# Patient Record
Sex: Female | Born: 1955 | Race: Black or African American | Hispanic: No | Marital: Married | Smoking: Never smoker
Health system: Southern US, Community
[De-identification: ages and names within clinical notes are randomized; demographics above are authoritative.]

## PROBLEM LIST (undated history)

## (undated) DIAGNOSIS — G4730 Sleep apnea, unspecified: Secondary | ICD-10-CM

## (undated) DIAGNOSIS — M1990 Unspecified osteoarthritis, unspecified site: Secondary | ICD-10-CM

## (undated) DIAGNOSIS — Z923 Personal history of irradiation: Secondary | ICD-10-CM

## (undated) DIAGNOSIS — C50919 Malignant neoplasm of unspecified site of unspecified female breast: Secondary | ICD-10-CM

## (undated) DIAGNOSIS — C801 Malignant (primary) neoplasm, unspecified: Secondary | ICD-10-CM

## (undated) DIAGNOSIS — R519 Headache, unspecified: Secondary | ICD-10-CM

## (undated) DIAGNOSIS — Z9221 Personal history of antineoplastic chemotherapy: Secondary | ICD-10-CM

## (undated) DIAGNOSIS — R51 Headache: Secondary | ICD-10-CM

## (undated) DIAGNOSIS — I10 Essential (primary) hypertension: Secondary | ICD-10-CM

## (undated) DIAGNOSIS — E119 Type 2 diabetes mellitus without complications: Secondary | ICD-10-CM

## (undated) DIAGNOSIS — G5601 Carpal tunnel syndrome, right upper limb: Secondary | ICD-10-CM

## (undated) DIAGNOSIS — D649 Anemia, unspecified: Secondary | ICD-10-CM

## (undated) HISTORY — DX: Type 2 diabetes mellitus without complications: E11.9

## (undated) HISTORY — PX: BREAST LUMPECTOMY: SHX2

## (undated) HISTORY — PX: BREAST BIOPSY: SHX20

## (undated) HISTORY — DX: Essential (primary) hypertension: I10

## (undated) HISTORY — DX: Malignant neoplasm of unspecified site of unspecified female breast: C50.919

## (undated) HISTORY — PX: DILATION AND CURETTAGE OF UTERUS: SHX78

---

## 1999-06-20 ENCOUNTER — Other Ambulatory Visit: Admission: RE | Admit: 1999-06-20 | Discharge: 1999-06-20 | Payer: Self-pay | Admitting: Internal Medicine

## 1999-08-10 ENCOUNTER — Encounter: Payer: Self-pay | Admitting: Internal Medicine

## 1999-08-10 ENCOUNTER — Encounter: Admission: RE | Admit: 1999-08-10 | Discharge: 1999-08-10 | Payer: Self-pay | Admitting: Internal Medicine

## 1999-08-24 ENCOUNTER — Encounter: Admission: RE | Admit: 1999-08-24 | Discharge: 1999-08-24 | Payer: Self-pay | Admitting: Internal Medicine

## 1999-08-24 ENCOUNTER — Encounter: Payer: Self-pay | Admitting: Internal Medicine

## 2000-03-04 ENCOUNTER — Encounter: Payer: Self-pay | Admitting: Internal Medicine

## 2000-03-04 ENCOUNTER — Encounter: Admission: RE | Admit: 2000-03-04 | Discharge: 2000-03-04 | Payer: Self-pay | Admitting: Internal Medicine

## 2000-09-04 ENCOUNTER — Encounter: Admission: RE | Admit: 2000-09-04 | Discharge: 2000-09-04 | Payer: Self-pay | Admitting: Internal Medicine

## 2000-09-04 ENCOUNTER — Encounter: Payer: Self-pay | Admitting: Internal Medicine

## 2001-04-07 ENCOUNTER — Ambulatory Visit (HOSPITAL_COMMUNITY): Admission: RE | Admit: 2001-04-07 | Discharge: 2001-04-07 | Payer: Self-pay | Admitting: Gastroenterology

## 2001-09-05 ENCOUNTER — Encounter: Admission: RE | Admit: 2001-09-05 | Discharge: 2001-09-05 | Payer: Self-pay | Admitting: Internal Medicine

## 2001-09-05 ENCOUNTER — Encounter: Payer: Self-pay | Admitting: Internal Medicine

## 2001-10-23 ENCOUNTER — Encounter: Admission: RE | Admit: 2001-10-23 | Discharge: 2001-10-23 | Payer: Self-pay | Admitting: Obstetrics and Gynecology

## 2001-10-23 ENCOUNTER — Encounter: Payer: Self-pay | Admitting: Obstetrics and Gynecology

## 2002-02-18 ENCOUNTER — Encounter: Admission: RE | Admit: 2002-02-18 | Discharge: 2002-02-18 | Payer: Self-pay | Admitting: Obstetrics and Gynecology

## 2002-02-18 ENCOUNTER — Encounter: Payer: Self-pay | Admitting: Obstetrics and Gynecology

## 2002-03-11 ENCOUNTER — Ambulatory Visit (HOSPITAL_COMMUNITY): Admission: RE | Admit: 2002-03-11 | Discharge: 2002-03-11 | Payer: Self-pay | Admitting: Obstetrics and Gynecology

## 2002-03-11 ENCOUNTER — Encounter (INDEPENDENT_AMBULATORY_CARE_PROVIDER_SITE_OTHER): Payer: Self-pay

## 2005-04-05 ENCOUNTER — Encounter: Admission: RE | Admit: 2005-04-05 | Discharge: 2005-04-05 | Payer: Self-pay | Admitting: Internal Medicine

## 2005-05-08 ENCOUNTER — Encounter: Admission: RE | Admit: 2005-05-08 | Discharge: 2005-05-08 | Payer: Self-pay | Admitting: Internal Medicine

## 2005-08-02 ENCOUNTER — Encounter: Admission: RE | Admit: 2005-08-02 | Discharge: 2005-08-02 | Payer: Self-pay | Admitting: Internal Medicine

## 2005-09-14 ENCOUNTER — Encounter: Admission: RE | Admit: 2005-09-14 | Discharge: 2005-09-14 | Payer: Self-pay | Admitting: Internal Medicine

## 2005-10-15 ENCOUNTER — Other Ambulatory Visit: Admission: RE | Admit: 2005-10-15 | Discharge: 2005-10-15 | Payer: Self-pay | Admitting: Obstetrics and Gynecology

## 2006-01-29 ENCOUNTER — Ambulatory Visit (HOSPITAL_COMMUNITY): Admission: RE | Admit: 2006-01-29 | Discharge: 2006-01-29 | Payer: Self-pay | Admitting: Obstetrics and Gynecology

## 2006-05-06 ENCOUNTER — Encounter: Admission: RE | Admit: 2006-05-06 | Discharge: 2006-05-06 | Payer: Self-pay | Admitting: Internal Medicine

## 2007-01-13 ENCOUNTER — Encounter: Admission: RE | Admit: 2007-01-13 | Discharge: 2007-01-13 | Payer: Self-pay | Admitting: Internal Medicine

## 2007-03-04 ENCOUNTER — Encounter: Admission: RE | Admit: 2007-03-04 | Discharge: 2007-03-04 | Payer: Self-pay | Admitting: Internal Medicine

## 2007-04-21 ENCOUNTER — Other Ambulatory Visit: Admission: RE | Admit: 2007-04-21 | Discharge: 2007-04-21 | Payer: Self-pay | Admitting: Internal Medicine

## 2008-04-22 ENCOUNTER — Other Ambulatory Visit: Admission: RE | Admit: 2008-04-22 | Discharge: 2008-04-22 | Payer: Self-pay | Admitting: Internal Medicine

## 2008-04-28 ENCOUNTER — Encounter: Admission: RE | Admit: 2008-04-28 | Discharge: 2008-04-28 | Payer: Self-pay | Admitting: Internal Medicine

## 2008-04-29 ENCOUNTER — Encounter: Admission: RE | Admit: 2008-04-29 | Discharge: 2008-04-29 | Payer: Self-pay | Admitting: Internal Medicine

## 2008-11-19 ENCOUNTER — Encounter: Admission: RE | Admit: 2008-11-19 | Discharge: 2008-11-19 | Payer: Self-pay | Admitting: Internal Medicine

## 2009-06-03 ENCOUNTER — Other Ambulatory Visit: Admission: RE | Admit: 2009-06-03 | Discharge: 2009-06-03 | Payer: Self-pay | Admitting: Internal Medicine

## 2009-06-13 ENCOUNTER — Encounter: Admission: RE | Admit: 2009-06-13 | Discharge: 2009-06-13 | Payer: Self-pay | Admitting: Internal Medicine

## 2010-09-03 ENCOUNTER — Encounter: Payer: Self-pay | Admitting: Obstetrics and Gynecology

## 2010-12-29 NOTE — H&P (Signed)
NAME:  Audrey Peters, Audrey Peters                          ACCOUNT NO.:  [id number]   MEDICAL RECORD NO.:  [id number]                   PATIENT TYPE:  AMB   LOCATION:  SDC                                  FACILITY:  [facility]   PHYSICIAN:  Diana B. Thomasena Edis, M.D.              DATE OF BIRTH:  Jan 01, 1956   DATE OF ADMISSION:  03/11/2002  DATE OF DISCHARGE:                                HISTORY & PHYSICAL   HISTORY OF PRESENT ILLNESS:  The patient is a 55 year old African-American  female referred for heavy bleeding. The patient had been on Triphasil for  years but continues to bleed every two to three weeks. She had a negative  pregnancy test, hemoglobin, however was noted to be normal at 13.1. She  subsequently underwent a pelvic ultrasound due to the menorrhagia with clots  and was found to have mildly prominent endometrium. She subsequently was  changed to Ovcon 35 but continued to bleed 5-6 days and continued to have  increasing dysmenorrhea. In addition, she did have a TSH checked at Dr.  Mayra Neer' office which was found to be negative. Subsequently, due to  her dysmenorrhea, the patient was scheduled for a sonohysterogram where she  was found to have an endometrial polyp measuring 11 mm x 7 mm x 9 mm.  Adjacent to this was a 5 x 2 mm polyp. The patient was advised to undergo  D&C hysteroscopy. The risks of surgery including anesthetic complications,  hemorrhage, infection, damage to adjacent structures including bladder,  bowel, blood vessels, and ureters were discussed with the patient. She was  made aware of the risk of uterine perforation which could result in  overwhelming life threatening hemorrhage requiring emergent hysterectomy or  uterine perforation which could result in bowel damage requiring emergent  colostomy or which could result in overwhelming life threatening  peritonitis. She expressed an understanding of and acceptance of these risks  and desires to proceed with the  procedure.   OB/GYN HISTORY:  Menarche age 32. Contraception Ovcon 35. No history of  sexually transmitted diseases. The patient's last Pap smear performed by Dr.  Lovell Sheehan was reportedly normal.   PAST MEDICAL HISTORY:  Hypertension.   ALLERGIES:  No known drug allergies.   CURRENT MEDICATIONS:  1. Ovcon 35  2. Zyrtec D  3. Triamterene 37.5 mg   PAST SURGICAL HISTORY:  None.   INJURIES:  None.   FAMILY HISTORY:  There is no family history of colon, breast, ovarian, or  prostate cancer. The patient's father died at 43 of irregular heart beat for  which he had a pacemaker. Mother is 37 with hypertension and diabetes. She  has one son age 17 alive and well.   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 Tobacco none. Alcohol none.   REVIEW OF SYSTEMS:  Noncontributory except as noted above. Denies headache,  visual changes,  chest pain, shortness of breath, abdominal pain, change in  bowel habits, unintentional weight loss, dysuria, urgency, frequency,  vaginal pruritus or discharge, pain or bleeding with intercourse.   PHYSICAL EXAMINATION:  GENERAL:  Well developed, pleasant female.  VITAL SIGNS:  Blood pressure 136/82, heart rate 84, weight 242.  HEENT:  Normal.  NECK:  Supple without thyromegaly, adenopathy or nodules.  LUNGS:  Clear to auscultation.  BREASTS:  Symmetrical without masses, nodes, no nipple retraction or nipple  discharge.  CARDIAC:  Regular rate and rhythm without extra sounds or murmurs.  ABDOMEN:  Soft, nontender, no hepatosplenomegaly or masses.  EXTREMITIES:  No cyanosis, clubbing or edema.  NEUROLOGIC:  Oriented x3, grossly normal.  PELVIC:  Normal external female genitalia. No vulva or vaginal regions. The  patient was found to have a cervical nabothian cyst which was biopsy  confirmed. Bimanual examination reveals the uterus to be upper limit of  normal size, mobile and nontender without any adnexal mass  palpated.  RECTAL:  Excellent sphincter tone. Confirms pelvic and no masses palpated.   LABORATORY DATA:  Ultrasound shows a mildly prominent uterus especially in  the fundal region where there was found to be a 1.8 cm palpable fibroid. The  ovaries were noted to be normal. The right ovary showed a collapsed cyst. As  mentioned above, the patient's TSH was also normal.   ASSESSMENT AND PLAN:  The patient is a 55 year old African-American female  for D&C hysteroscopy due to continued dysmenorrhea and menorrhagia and found  to have endometrial polyps on sonohysterogram. The risks have been explained  to the patient. She expresses understanding of and acceptance of these risks  and desires to proceed with D&C hysteroscopy.                                                Beather Arbour Thomasena Edis, M.D.    DBC/MEDQ  D:  03/10/2002  T:  03/10/2002  Job:  [id number]   cc:   Day Surgery

## 2010-12-29 NOTE — Op Note (Signed)
NAME:  Audrey Peters, Audrey Peters                          ACCOUNT NO.:  [id number]   MEDICAL RECORD NO.:  [id number]                   PATIENT TYPE:  AMB   LOCATION:  SDC                                  FACILITY:  [facility]   PHYSICIAN:  Diana B. Thomasena Edis, M.D.              DATE OF BIRTH:  Dec 26, 1955   DATE OF PROCEDURE:  03/11/2002  DATE OF DISCHARGE:                                 OPERATIVE REPORT   PREOPERATIVE DIAGNOSES:  1. Menorrhagia.  2. Polyps on sonohysterogram.  3. Continued menorrhagia on oral contraceptives.   POSTOPERATIVE DIAGNOSES:  1. Menorrhagia.  2. Polyps on sonohysterogram.  3. Continue menorrhagia on oral contraceptives.   PROCEDURES:  1. Dilatation and curettage.  2. Polypectomy.   Unable to do hysteroscopy.   ANESTHESIA:  Monitored anesthesia care plus 10 cc of 1% lidocaine  paracervical block.   ESTIMATED BLOOD LOSS:  Minimal.   FLUIDS:  1350 cc of crystalloid.   COMPLICATIONS:  None.   DRAINS:  None.   DESCRIPTION OF PROCEDURE:  The patient was brought to the operating room and  identified on the operating room table.  After the patient was adequately  sedated using monitored anesthesia care, she was placed in the  dorsolithotomy position and prepped and draped in the usual sterile fashion.  The bladder was straight catheterized for approximately 50 cc of clear  yellow urine.  A bimanual examination revealed the uterus to be in mid  position and level without any adnexal mass palpated.  The anterior lip of  the cervix was infiltrated with 1 cc of 1% lidocaine and grasped with a  single-tooth tenaculum.  The remaining 9 cc of 1% lidocaine were infused for  paracervical block.  The uterus sounded to approximately 8 cm.  Using [facility]  dilators, the cervix was very carefully and gently dilated.  However, it  should be noted that the cervical os, although parous, was noted to be very  eccentric with the opening to the left.  I was able to easily sound the  uterus and was able to easily gently dilate her up to a #19 Pratt dilator.  However, after the #21 Pratt dilator, I was unable to dilate up past that  point.  This was due to the eccentric nature of the cervix and the fact that  approximately 1.5-2 cm into the endocervical canal there was a ridge of  tissue that emanated into the endocervical canal, making it extremely  difficult to dilate further.  It was the surgeon's opinion not to proceed  any further because to force this could result in uterine perforation and  subsequent complications due to perforation.  Thus, as I was able to place  the small serrated curet to remove the polyp, I elected to do this instead.  The serrated curet was then placed and the uterus was systemically curetted  in a systematic clockwise fashion with tissue obtained.  The Randall stone  forceps were placed and additional tissue was obtained.  I did curet the  uterus such that a good cry was heard all around.  At that point, the  procedure was terminated.  The tenaculum was removed.  There was noted to be  some bleeding at the tenaculum site.  Hemostasis was achieved using silver  nitrate sticks.  At that point, the procedure was then terminated.  The  patient tolerated the procedure well without apparent complications.  She  was transferred to the recovery room in stable condition after all sponge,  needle, and instrument counts were correct.  The patient received the  postoperative D&C instruction sheet.  She is to take 400-600 mg of ibuprofen  every six hours as needed for pain.  She is to call for any problems and to  return to the office in two to three weeks for a postoperative exam.                                               Lafonda Mosses B. Thomasena Edis, M.D.    DBC/MEDQ  D:  03/11/2002  T:  03/16/2002  Job:  [id number]   cc:   Lilla Shook, M.D.

## 2010-12-29 NOTE — Procedures (Signed)
[facility]  Patient:    Audrey Peters, Audrey Peters Visit Number: [id number] MRN: [id number]          Service Type: Attending:  Verlin Grills, M.D. Proc. Date: 04/07/01   CC:         Modesta Messing, M.D., [facility]   Procedure Report  PROCEDURE:  Colonoscopy.  PROCEDURE INDICATION:  Audrey Peters (date of birth 2056/08/07) is a 55 year old female with Hemoccult positive stool and iron deficiency anemia.  ENDOSCOPIST:  Verlin Grills, M.D.  PREMEDICATION:  Versed 7.5 mg, fentanyl 50 mcg.  ENDOSCOPE:  Olympus pediatric colonoscope.  DESCRIPTION OF PROCEDURE:  After obtaining informed consent, Audrey Peters was placed in the left lateral decubitus position.  I administered intravenous fentanyl and intravenous Versed to achieve conscious sedation for the procedure.  The patients blood pressure, oxygen saturations, and cardiac rhythm were monitored throughout the procedure and documented in the medical record.  Anal inspection was normal.  Digital rectal exam was normal.  The Olympus pediatric video colonoscope was introduced into the rectum and easily advanced to the cecum.  Colonic preparation for the exam today was excellent.  RECTUM:  Normal.  SIGMOID COLON AND DESCENDING COLON:  Normal.  SPLENIC FLEXURE:  Normal.  TRANSVERSE COLON:  Normal.  HEPATIC FLEXURE:  Normal.  ASCENDING COLON:  Normal.  CECUM AND ILEOCECAL VALVE:  Normal.  ASSESSMENT:  Normal proctocolonoscopy to the cecum.  No endoscopic evidence for the presence of colorectal neoplasia or inflammatory bowel disease. Attending:  Verlin Grills, M.D. DD:  04/07/01 TD:  04/07/01 Job: [id number]

## 2013-06-09 ENCOUNTER — Other Ambulatory Visit: Payer: Self-pay | Admitting: Internal Medicine

## 2013-06-09 DIAGNOSIS — Z1231 Encounter for screening mammogram for malignant neoplasm of breast: Secondary | ICD-10-CM

## 2013-07-17 ENCOUNTER — Ambulatory Visit
Admission: RE | Admit: 2013-07-17 | Discharge: 2013-07-17 | Disposition: A | Discharge status: Home / Self Care | Payer: BC Managed Care – PPO | Source: Ambulatory Visit | Attending: Internal Medicine | Admitting: Internal Medicine

## 2013-07-17 DIAGNOSIS — Z1231 Encounter for screening mammogram for malignant neoplasm of breast: Secondary | ICD-10-CM

## 2014-02-22 ENCOUNTER — Other Ambulatory Visit: Payer: Self-pay | Admitting: Internal Medicine

## 2014-02-22 ENCOUNTER — Other Ambulatory Visit (HOSPITAL_COMMUNITY)
Admission: RE | Admit: 2014-02-22 | Discharge: 2014-02-22 | Disposition: A | Discharge status: Home / Self Care | Payer: BC Managed Care – PPO | Source: Ambulatory Visit | Attending: Internal Medicine | Admitting: Internal Medicine

## 2014-02-22 DIAGNOSIS — Z1151 Encounter for screening for human papillomavirus (HPV): Secondary | ICD-10-CM | POA: Insufficient documentation

## 2014-02-22 DIAGNOSIS — Z01419 Encounter for gynecological examination (general) (routine) without abnormal findings: Secondary | ICD-10-CM | POA: Insufficient documentation

## 2014-02-23 LAB — CYTOLOGY - PAP

## 2014-05-11 ENCOUNTER — Ambulatory Visit
Admission: RE | Admit: 2014-05-11 | Discharge: 2014-05-11 | Disposition: A | Discharge status: Home / Self Care | Payer: BC Managed Care – PPO | Source: Ambulatory Visit | Attending: Internal Medicine | Admitting: Internal Medicine

## 2014-05-11 ENCOUNTER — Other Ambulatory Visit: Payer: Self-pay | Admitting: Internal Medicine

## 2014-05-11 DIAGNOSIS — M25561 Pain in right knee: Secondary | ICD-10-CM

## 2014-05-13 ENCOUNTER — Encounter: Payer: Self-pay | Admitting: Cardiology

## 2014-09-10 ENCOUNTER — Other Ambulatory Visit: Payer: Self-pay

## 2014-09-10 DIAGNOSIS — Z1231 Encounter for screening mammogram for malignant neoplasm of breast: Secondary | ICD-10-CM

## 2014-09-17 ENCOUNTER — Ambulatory Visit
Admission: RE | Admit: 2014-09-17 | Discharge: 2014-09-17 | Disposition: A | Discharge status: Home / Self Care | Payer: BC Managed Care – PPO | Source: Ambulatory Visit

## 2014-09-17 DIAGNOSIS — Z1231 Encounter for screening mammogram for malignant neoplasm of breast: Secondary | ICD-10-CM

## 2015-01-30 ENCOUNTER — Ambulatory Visit (INDEPENDENT_AMBULATORY_CARE_PROVIDER_SITE_OTHER): Payer: BC Managed Care – PPO | Admitting: Physician Assistant

## 2015-01-30 VITALS — BP 144/86 | HR 77 | Temp 98.5°F | Resp 18 | Ht 64.0 in | Wt 221.8 lb

## 2015-01-30 DIAGNOSIS — I10 Essential (primary) hypertension: Secondary | ICD-10-CM

## 2015-01-30 DIAGNOSIS — E119 Type 2 diabetes mellitus without complications: Secondary | ICD-10-CM

## 2015-01-30 DIAGNOSIS — K05219 Aggressive periodontitis, localized, unspecified severity: Secondary | ICD-10-CM

## 2015-01-30 DIAGNOSIS — K0521 Aggressive periodontitis, localized: Secondary | ICD-10-CM | POA: Diagnosis not present

## 2015-01-30 MED ORDER — CLINDAMYCIN HCL 300 MG PO CAPS: 300.0000 mg | ORAL_CAPSULE | Freq: Three times a day (TID) | ORAL | Status: DC

## 2015-01-30 NOTE — Patient Instructions (Signed)
We did an incision and drainage of the abscess in your mouth. Please take the clindamycin three times daily for 10 days.  Be sure to take a probiotic during the next couple days.  Please be sure to see a dentist this week to look into your tooth infection.

## 2015-01-30 NOTE — Progress Notes (Signed)
  Medical screening examination/treatment/procedure(s) were performed by non-physician practitioner and as supervising physician I was immediately available for consultation/collaboration.     

## 2015-01-30 NOTE — Progress Notes (Signed)
   Subjective:    Patient ID: Audrey Peters, female    DOB: Nov 27, 1955, 59 y.o.   MRN: [id number]  Chief Complaint  Patient presents with  . Abscess    Location: Right inside of mouth   Patient Active Problem List   Diagnosis Date Noted  . Essential hypertension 01/30/2015  . Diabetes mellitus 01/30/2015   Prior to Admission medications   Medication Sig Start Date End Date Taking? Authorizing Provider  insulin aspart protamine- aspart (NOVOLOG MIX 70/30) (70-30) 100 UNIT/ML injection Inject into the skin.   Yes Historical Provider, MD  losartan (COZAAR) 100 MG tablet Take 100 mg by mouth daily.   Yes Historical Provider, MD  metFORMIN (GLUCOPHAGE) 500 MG tablet Take by mouth 2 (two) times daily with a meal.   Yes Historical Provider, MD  triamterene-hydrochlorothiazide (MAXZIDE-25) 37.5-25 MG per tablet  01/07/15  Yes Historical Provider, MD  clindamycin (CLEOCIN) 300 MG capsule Take 1 capsule (300 mg total) by mouth 3 (three) times daily. 01/30/15   Araceli Bouche, PA  NOVOLOG MIX 70/30 FLEXPEN (70-30) 100 UNIT/ML FlexPen  01/26/15   Historical Provider, MD  ONE TOUCH ULTRA TEST test strip  01/26/15   Historical Provider, MD   Medications, allergies, past medical history, surgical history, family history, social history and problem list reviewed and updated.  HPI  59 yof presents with tooth abscess.  Pain started Friday. Feels there is an abscess upper right mouth. Has not been dentist in several yrs. Denies fevers. Has been taking ibuprofen for pain.   Review of Systems No chills.     Objective:   Physical Exam  Constitutional: She is oriented to person, place, and time. She appears well-developed and well-nourished.  Non-toxic appearance. She does not have a sickly appearance. She does not appear ill. No distress.  BP 144/86 mmHg  Pulse 77  Temp(Src) 98.5 F (36.9 C) (Oral)  Resp 18  Ht 5\' 4"  (1.626 m)  Wt 221 lb 12.8 oz (100.608 kg)  BMI 38.05 kg/m2  SpO2 97%   HENT:    Mouth/Throat:    Abscess in area of missing first molar right upper mouth. Right sided facial swelling.   Neurological: She is alert and oriented to person, place, and time.      Assessment & Plan:   59 yof presents with tooth abscess.  Periodontal abscess - Plan: clindamycin (CLEOCIN) 300 MG capsule --I&D as per procedure note, pt tolerated well --clinda tid 10 days --instructed to see dentist within next week as she has infected tooth near site of abscess  Julieta Gutting, PA-C Physician Assistant-Certified [name]  01/30/2015 10:20 AM

## 2015-01-30 NOTE — Progress Notes (Signed)
Verbal Consent Obtained. Infraorbital nerve block anesthesia with 2 cc of 1% lidocaine with epi.  11 blade used to incise the lesion centrally.  Purulence expressed.  Cleansed and dressed.

## 2015-07-06 NOTE — H&P (Signed)
59yo postmenopausal female who presents hysteroscopy, D&C due to recurrent PMB. Work up to date has been negative (EMB and TVUS) and pt was started on vaginal estrogen due to bleeding from suspected atrophy. The patient has been using the cream, but intermittent spotting has continued. The spotting is usually worse around the first of the month and states it feels like it is the "start of her period." Spotting is pink to bright red- denies heavy bleeding. No abdominal or pelvic pain. She does think the estrace has helped with the dryness and it occasionally causes her to "feel warm." Usually last for just a few seconds and then resolves.  Gyn History  Sexual activity currently sexually active.  Periods : postmenopausal.  Denies H/O LMP- see HPI Denies H/O Birth control.  Last pap smear date 02/22/2014 Normal.  Last mammogram date 09/17/2014.  Denies H/O Abnormal pap smear.  GYN procedures 03/03/15 - Endometrial BX.    OB History  Pregnancy # 1 live birth, vaginal delivery, boy.     Current Medications  Taking   Oscal 500/200 D-3(Calcium-Vitamin D) 500-200 MG-UNIT Tablet 1 tablet Once a day   Tylenol Extra Strength(APAP) 500 MG Tablet 1 tablet as needed every 6 hrs   BD U/F Short Pen Needle(Insulin Pen Needle) 31G X 8 MM Miscellaneous USE TWICE DAILY.   Maxzide-25(Triamterene-HCTZ) 37.5-25 MG Tablet TAKE 1 TABLET IN THE MORNING.   Lotrisone(Clotrimazole-Betamethasone) 1-0.05 % Cream APPLY TO AFFECTED AREA TWICE A DAY FOR 7 DAYS.   OneTouch Ultra Test(Blood Glucose Test) . Strip CHECK BLOOD SUGAR TWICE DAILY.   Ciprofloxacin HCl 250 MG Tablet 1 tablet twice a day   Atorvastatin Calcium 40 MG Tablet 1 tablet Once a day at bedtime   Losartan Potassium 100 MG Tablet 1 tablet Once a day   Aspir-81(Aspirin) 81 MG Tablet Delayed Release 1 tablet Once a day   Premarin(Estrogens Conjugated) 0.625 MG/GM Cream 0.5g Twice per week   Metformin HCl 500 MG Tablet 1 tablet with a meal Twice a day    NovoLog Mix 70/30 Flexpen(Insulin Aspart Prot & Aspart) (70-30) 100 UNIT/ML Suspension Pen-injector 110 units at breakfast, 50 units at lunch, 50 units at evening meal 3 times a day   Medication List reviewed and reconciled with the patient    Past Medical History  Type 2 diabetes mellitus  Hypercholesterolemia  Hypertension  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   Social History  General:  Tobacco use  cigarettes: Never smoked Tobacco history last updated 04/08/2015 Additional Findings: Tobacco Non-User Non-smoker for personal reasons no EXPOSURE TO PASSIVE SMOKE.  no Alcohol, no.  no Recreational drug use.  Marital Status: married, Married.  OCCUPATION: Pharmacist, hospital, retired, formerly at [employer].    Allergies  Sulfa drugs (for allergy)  Cipro: palpitation  Invokana: yeast infections: Side Effects   Hospitalization/Major Diagnostic Procedure  childbirth    Review of Systems  CONSTITUTIONAL:  no Chills. no Fever.  SKIN:  no Rash. no Hives.  HEENT:  Blurrred vision no. no Double vision.  CARDIOLOGY:  no Chest pain.  RESPIRATORY:  no Shortness of breath. no Cough.  GASTROENTEROLOGY:  no Abdominal pain. no Appetite change. no Change in bowel movements.  UROLOGY:  no Urinary frequency. no  Urinary incontinence. no Urinary urgency.  FEMALE REPRODUCTIVE:  no Breast lumps or discharge. no Breast pain.  NEUROLOGY:  no Dizziness. no Headache. no Loss of consciousness.  PSYCHOLOGY:  no Depression. no Confusion.  HEMATOLOGY/LYMPH:  no Anemia. no Fatigue. Using Blood Thinners no.     O: Performed in office  Vital Signs  Wt 236, Wt change 1.6 lb, Ht 64.5, BMI 39.88, Pulse sitting 80, BP sitting 116/70.    Examination  General Examination: GENERAL APPEARANCE well developed, well nourished.  SKIN: warm and dry, no rashes.  NECK: supple, normal appearance.  LUNGS: regular breathing rate and effort.  ABDOMEN: soft and not tender.  FEMALE GENITOURINARY: normal external genitalia, labia - unremarkable, vagina - pink moist mucosa with loss of rugae- findings c/w atrophy, no lesions or abnormal discharge, cervix - no discharge or lesions or CMT, adnexa - no masses or tenderness.  MUSCULOSKELETAL no calf tenderness bilaterally.  EXTREMITIES: no edema present.  PSYCH appropriate mood and affect.     Labs: SHG: (03/2015): 8wk sized uterus with 3 small fibroids- largest 1.8cm submucosal fibroid slightly pushing into endometrial cavity. No masses seen within endometrium. Thin endometrium walls- total thickness 3.89mm. Normal ovaries bilaterally.  A/P: 59yo postmenopausal female who presents for hysteroscopy, D&C due to recurrent postmenopausal bleeding -NPO -LR @ 125cc/hr -Antibiotics not indicated -SCDs to OR -Risk,benefit and indications reviewed with patient.  Questions and concerns were addressed.  Janyth Pupa, DO [phone] (pager) [phone] (office)

## 2015-07-13 ENCOUNTER — Other Ambulatory Visit: Payer: Self-pay

## 2015-07-13 ENCOUNTER — Encounter (HOSPITAL_COMMUNITY)
Admission: RE | Admit: 2015-07-13 | Discharge: 2015-07-13 | Disposition: A | Discharge status: Home / Self Care | Payer: BC Managed Care – PPO | Source: Ambulatory Visit | Attending: Obstetrics & Gynecology | Admitting: Obstetrics & Gynecology

## 2015-07-13 ENCOUNTER — Encounter (HOSPITAL_COMMUNITY): Payer: Self-pay

## 2015-07-13 DIAGNOSIS — N950 Postmenopausal bleeding: Secondary | ICD-10-CM | POA: Insufficient documentation

## 2015-07-13 DIAGNOSIS — Z01818 Encounter for other preprocedural examination: Secondary | ICD-10-CM | POA: Diagnosis not present

## 2015-07-13 HISTORY — DX: Sleep apnea, unspecified: G47.30

## 2015-07-13 LAB — CBC
HEMATOCRIT: 39.4 % (ref 36.0–46.0)
Hemoglobin: 12.7 g/dL (ref 12.0–15.0)
MCH: 27.7 pg (ref 26.0–34.0)
MCHC: 32.2 g/dL (ref 30.0–36.0)
MCV: 86 fL (ref 78.0–100.0)
Platelets: 210 10*3/uL (ref 150–400)
RBC: 4.58 MIL/uL (ref 3.87–5.11)
RDW: 13.4 % (ref 11.5–15.5)
WBC: 7.8 10*3/uL (ref 4.0–10.5)

## 2015-07-13 LAB — BASIC METABOLIC PANEL
Anion gap: 9 (ref 5–15)
BUN: 18 mg/dL (ref 6–20)
CO2: 27 mmol/L (ref 22–32)
Calcium: 9.2 mg/dL (ref 8.9–10.3)
Chloride: 104 mmol/L (ref 101–111)
Creatinine, Ser: 0.91 mg/dL (ref 0.44–1.00)
GFR calc Af Amer: >60 mL/min (ref >60)
GLUCOSE: 143 mg/dL — AB (ref 65–99)
POTASSIUM: 3.6 mmol/L (ref 3.5–5.1)
Sodium: 140 mmol/L (ref 135–145)

## 2015-07-13 NOTE — Patient Instructions (Addendum)
Your procedure is scheduled on:07/21/15  Enter through the Main Entrance at :51 am Pick up desk phone and dial [phone] and inform us of your arrival.  Please call [phone] if you have any problems the morning of surgery.  Remember: Do not eat food after midnight:WED Clear liquids are ok until:9am on Thursday   You may brush your teeth the morning of surgery. DO NOT TAKE METFORMIN THE MORNING OF SURGERY Take these meds the morning of surgery with a sip of water:All blood pressure pills  DO NOT wear jewelry, eye make-up, lipstick,body lotion, or dark fingernail polish.  (Polished toes are ok) You may wear deodorant.  If you are to be admitted after surgery, leave suitcase in car until your room has been assigned. Patients discharged on the day of surgery will not be allowed to drive home. Wear loose fitting, comfortable clothes for your ride home.

## 2015-07-15 NOTE — Anesthesia Preprocedure Evaluation (Addendum)
Anesthesia Evaluation  Patient identified by MRN, date of birth, ID band Patient awake    Reviewed: Allergy & Precautions, NPO status , Patient's Chart, lab work & pertinent test results  Airway Mallampati: III  TM Distance: >3 FB Neck ROM: Full    Dental  (+) Teeth Intact, Dental Advisory Given   Pulmonary sleep apnea and Continuous Positive Airway Pressure Ventilation ,    Pulmonary exam normal breath sounds clear to auscultation       Cardiovascular Exercise Tolerance: Good hypertension, Pt. on medications Normal cardiovascular exam Rhythm:Regular Rate:Normal  EKG 06/2015 OK   Neuro/Psych    GI/Hepatic negative GI ROS, Neg liver ROS, neg GERD  ,  Endo/Other  diabetes, Poorly Controlled, Insulin DependentMorbid obesity  Renal/GU negative Renal ROS     Musculoskeletal negative musculoskeletal ROS (+)   Abdominal   Peds  Hematology 13/39   Anesthesia Other Findings   Reproductive/Obstetrics negative OB ROS                          Anesthesia Physical Anesthesia Plan  ASA: III  Anesthesia Plan: General   Post-op Pain Management:    Induction: Intravenous  Airway Management Planned: LMA  Additional Equipment:   Intra-op Plan:   Post-operative Plan: Extubation in OR  Informed Consent: I have reviewed the patients History and Physical, chart, labs and discussed the procedure including the risks, benefits and alternatives for the proposed anesthesia with the patient or authorized representative who has indicated his/her understanding and acceptance.   Dental advisory given  Plan Discussed with: CRNA  Anesthesia Plan Comments: (Risks/benefits of general anesthesia discussed with patient including risk of damage to teeth, lips, gum, and tongue, nausea/vomiting, allergic reactions to medications, and the possibility of heart attack, stroke and death.  All patient questions answered.   Patient wishes to proceed.)       Anesthesia Quick Evaluation

## 2015-07-21 ENCOUNTER — Ambulatory Visit (HOSPITAL_COMMUNITY)
Admission: RE | Admit: 2015-07-21 | Discharge: 2015-07-21 | Disposition: A | Discharge status: Home / Self Care | Payer: BC Managed Care – PPO | Source: Ambulatory Visit | Attending: Obstetrics & Gynecology | Admitting: Obstetrics & Gynecology

## 2015-07-21 ENCOUNTER — Encounter (HOSPITAL_COMMUNITY)
Admission: RE | Disposition: A | Discharge status: Home / Self Care | Payer: Self-pay | Source: Ambulatory Visit | Attending: Obstetrics & Gynecology

## 2015-07-21 ENCOUNTER — Ambulatory Visit (HOSPITAL_COMMUNITY): Payer: BC Managed Care – PPO | Admitting: Anesthesiology

## 2015-07-21 ENCOUNTER — Encounter (HOSPITAL_COMMUNITY): Payer: Self-pay | Admitting: *Deleted

## 2015-07-21 DIAGNOSIS — D250 Submucous leiomyoma of uterus: Secondary | ICD-10-CM | POA: Diagnosis not present

## 2015-07-21 DIAGNOSIS — K219 Gastro-esophageal reflux disease without esophagitis: Secondary | ICD-10-CM | POA: Insufficient documentation

## 2015-07-21 DIAGNOSIS — N840 Polyp of corpus uteri: Secondary | ICD-10-CM | POA: Diagnosis not present

## 2015-07-21 DIAGNOSIS — I10 Essential (primary) hypertension: Secondary | ICD-10-CM | POA: Insufficient documentation

## 2015-07-21 DIAGNOSIS — G4730 Sleep apnea, unspecified: Secondary | ICD-10-CM | POA: Insufficient documentation

## 2015-07-21 DIAGNOSIS — N950 Postmenopausal bleeding: Secondary | ICD-10-CM | POA: Insufficient documentation

## 2015-07-21 DIAGNOSIS — Z6841 Body Mass Index (BMI) 40.0 and over, adult: Secondary | ICD-10-CM | POA: Insufficient documentation

## 2015-07-21 HISTORY — PX: HYSTEROSCOPY W/D&C: SHX1775

## 2015-07-21 LAB — GLUCOSE, CAPILLARY
Glucose-Capillary: 162 mg/dL — ABNORMAL HIGH (ref 65–99)
Glucose-Capillary: 147 mg/dL — ABNORMAL HIGH (ref 65–99)

## 2015-07-21 SURGERY — DILATATION AND CURETTAGE /HYSTEROSCOPY
Anesthesia: General

## 2015-07-21 MED ORDER — FENTANYL CITRATE (PF) 100 MCG/2ML IJ SOLN
25.0000 ug | INTRAMUSCULAR | Status: DC | PRN
  Administered 2015-07-21: 50 ug via INTRAVENOUS

## 2015-07-21 MED ORDER — LACTATED RINGERS IV SOLN
INTRAVENOUS | Status: DC
  Administered 2015-07-21 (×2): via INTRAVENOUS

## 2015-07-21 MED ORDER — FENTANYL CITRATE (PF) 100 MCG/2ML IJ SOLN
INTRAMUSCULAR | Status: DC | PRN
  Administered 2015-07-21 (×4): 50 ug via INTRAVENOUS

## 2015-07-21 MED ORDER — DEXAMETHASONE SODIUM PHOSPHATE 4 MG/ML IJ SOLN
INTRAMUSCULAR | Status: AC
  Filled 2015-07-21: qty 1

## 2015-07-21 MED ORDER — PROMETHAZINE HCL 25 MG/ML IJ SOLN: 6.2500 mg | INTRAMUSCULAR | Status: DC | PRN

## 2015-07-21 MED ORDER — KETOROLAC TROMETHAMINE 30 MG/ML IJ SOLN
INTRAMUSCULAR | Status: AC
  Filled 2015-07-21: qty 1

## 2015-07-21 MED ORDER — SCOPOLAMINE 1 MG/3DAYS TD PT72
MEDICATED_PATCH | TRANSDERMAL | Status: AC
  Administered 2015-07-21: 1.5 mg via TRANSDERMAL
  Filled 2015-07-21: qty 1

## 2015-07-21 MED ORDER — ONDANSETRON HCL 4 MG/2ML IJ SOLN
INTRAMUSCULAR | Status: AC
  Filled 2015-07-21: qty 2

## 2015-07-21 MED ORDER — PHENYLEPHRINE HCL 10 MG/ML IJ SOLN
INTRAMUSCULAR | Status: DC | PRN
  Administered 2015-07-21 (×3): 40 ug via INTRAVENOUS

## 2015-07-21 MED ORDER — FENTANYL CITRATE (PF) 250 MCG/5ML IJ SOLN
INTRAMUSCULAR | Status: AC
  Filled 2015-07-21: qty 5

## 2015-07-21 MED ORDER — KETOROLAC TROMETHAMINE 30 MG/ML IJ SOLN
INTRAMUSCULAR | Status: DC | PRN
  Administered 2015-07-21: 15 mg via INTRAVENOUS

## 2015-07-21 MED ORDER — SCOPOLAMINE 1 MG/3DAYS TD PT72
1.0000 | MEDICATED_PATCH | Freq: Once | TRANSDERMAL | Status: DC
Start: 2015-07-21 — End: 2015-07-21
  Administered 2015-07-21: 1.5 mg via TRANSDERMAL

## 2015-07-21 MED ORDER — FENTANYL CITRATE (PF) 100 MCG/2ML IJ SOLN: 25.0000 ug | INTRAMUSCULAR | Status: DC | PRN

## 2015-07-21 MED ORDER — PROPOFOL 10 MG/ML IV BOLUS
INTRAVENOUS | Status: DC | PRN
  Administered 2015-07-21: 180 mg via INTRAVENOUS

## 2015-07-21 MED ORDER — GLYCOPYRROLATE 0.2 MG/ML IJ SOLN
INTRAMUSCULAR | Status: AC
Start: 2015-07-21 — End: 2015-07-21
  Filled 2015-07-21: qty 1

## 2015-07-21 MED ORDER — PROPOFOL 10 MG/ML IV BOLUS
INTRAVENOUS | Status: AC
  Filled 2015-07-21: qty 20

## 2015-07-21 MED ORDER — LIDOCAINE HCL (CARDIAC) 20 MG/ML IV SOLN
INTRAVENOUS | Status: DC | PRN
  Administered 2015-07-21: 80 mg via INTRAVENOUS

## 2015-07-21 MED ORDER — LIDOCAINE HCL (CARDIAC) 20 MG/ML IV SOLN
INTRAVENOUS | Status: AC
  Filled 2015-07-21: qty 5

## 2015-07-21 MED ORDER — ONDANSETRON HCL 4 MG/2ML IJ SOLN
INTRAMUSCULAR | Status: DC | PRN
  Administered 2015-07-21: 4 mg via INTRAVENOUS

## 2015-07-21 MED ORDER — MIDAZOLAM HCL 2 MG/2ML IJ SOLN
INTRAMUSCULAR | Status: AC
  Filled 2015-07-21: qty 2

## 2015-07-21 MED ORDER — LACTATED RINGERS IV SOLN: INTRAVENOUS | Status: DC

## 2015-07-21 MED ORDER — SILVER NITRATE-POT NITRATE 75-25 % EX MISC
CUTANEOUS | Status: AC
Start: 2015-07-21 — End: 2015-07-21
  Filled 2015-07-21: qty 1

## 2015-07-21 MED ORDER — SILVER NITRATE-POT NITRATE 75-25 % EX MISC
CUTANEOUS | Status: AC
  Filled 2015-07-21: qty 3

## 2015-07-21 MED ORDER — MEPERIDINE HCL 25 MG/ML IJ SOLN: 6.2500 mg | INTRAMUSCULAR | Status: DC | PRN

## 2015-07-21 MED ORDER — FENTANYL CITRATE (PF) 100 MCG/2ML IJ SOLN
INTRAMUSCULAR | Status: AC
  Filled 2015-07-21: qty 2

## 2015-07-21 MED ORDER — MIDAZOLAM HCL 2 MG/2ML IJ SOLN
INTRAMUSCULAR | Status: DC | PRN
  Administered 2015-07-21: 1 mg via INTRAVENOUS

## 2015-07-21 SURGICAL SUPPLY — 18 items
CANISTER SUCT 3000ML (MISCELLANEOUS) ×3 IMPLANT
CATH ROBINSON RED A/P 16FR (CATHETERS) ×3 IMPLANT
CLOTH BEACON ORANGE TIMEOUT ST (SAFETY) ×3 IMPLANT
CONTAINER PREFILL 10% NBF 60ML (FORM) ×6 IMPLANT
DEVICE MYOSURE LITE (MISCELLANEOUS) ×2 IMPLANT
DILATOR CANAL MILEX (MISCELLANEOUS) IMPLANT
GLOVE BIOGEL PI IND STRL 6.5 (GLOVE) ×2 IMPLANT
GLOVE BIOGEL PI IND STRL 7.0 (GLOVE) ×1 IMPLANT
GLOVE BIOGEL PI INDICATOR 6.5 (GLOVE) ×4
GLOVE BIOGEL PI INDICATOR 7.0 (GLOVE) ×2
GLOVE ECLIPSE 6.5 STRL STRAW (GLOVE) ×3 IMPLANT
GOWN STRL REUS W/TWL LRG LVL3 (GOWN DISPOSABLE) ×6 IMPLANT
PACK VAGINAL MINOR WOMEN LF (CUSTOM PROCEDURE TRAY) ×3 IMPLANT
PAD OB MATERNITY 4.3X12.25 (PERSONAL CARE ITEMS) ×3 IMPLANT
TOWEL OR 17X24 6PK STRL BLUE (TOWEL DISPOSABLE) ×6 IMPLANT
TUBING AQUILEX INFLOW (TUBING) ×5 IMPLANT
TUBING AQUILEX OUTFLOW (TUBING) ×3 IMPLANT
WATER STERILE IRR 1000ML POUR (IV SOLUTION) ×3 IMPLANT

## 2015-07-21 NOTE — Anesthesia Postprocedure Evaluation (Signed)
Anesthesia Post Note  Patient: Audrey Peters  Procedure(s) Performed: Procedure(s) (LRB): DILATATION AND CURETTAGE /HYSTEROSCOPY with myosure (N/A)  Patient location during evaluation: PACU Anesthesia Type: General Level of consciousness: awake and alert Pain management: pain level controlled Vital Signs Assessment: post-procedure vital signs reviewed and stable Respiratory status: spontaneous breathing, nonlabored ventilation, respiratory function stable and patient connected to nasal cannula oxygen Cardiovascular status: blood pressure returned to baseline and stable Postop Assessment: no signs of nausea or vomiting Anesthetic complications: no    Last Vitals:  Filed Vitals:   07/21/15 1128 07/21/15 1442  BP: 135/90 138/81  Pulse: 68 82  Temp: 36.6 C 36.6 C  Resp: 18 16    Last Pain:  Filed Vitals:   07/21/15 1459  PainSc: 7                  Zenaida Deed

## 2015-07-21 NOTE — Op Note (Signed)
Operative Report  PreOp: postmenopausal bleeding PostOp: same and uterine polyps Procedure:  Hysteroscopy, Dilation and Curettage, Myosure polypectomy Surgeon: Dr. Janyth Pupa Anesthesia: General Complications:none EBL: 36mL UOP: 50cc IVF:1000cc  Findings:8 cm uterus with thickened endometrium and multiple polyps noted throughout the cavity, both ostia visualized Specimens: 1) ECC 2) EMB with polyp specimen   Procedure: The patient was taken to the operating room where she underwent general anesthesia without difficulty. The patient was placed in a low lithotomy position using Allen stirrups. The patient was examined with the findings as noted above.  She was then prepped and draped in the normal sterile fashion. The bladder was drained using a red rubber urethral catheter. A sterile speculum was inserted into the vagina. A single tooth tenaculum was placed on the anterior lip of the cervix. The uterus was then sounded to 8cm. The endocervical canal was then serially dilated to 14French using Hank dilators.  The diagnostic hysteroscope was then inserted without difficulty and noted to have the findings as listed above. The hysteroscope was removed and replaced with the Myosure device.  Resection of the all uterine polyps was performed.  The device was removed and sharp curettage was performed.  The hysteroscope was reinserted- normal cavity seen with no evidence of uterine perforation.  NS was used as the distending medium.  All instrument were then removed. Hemostasis was observed at the cervical site.  The patient was repositioned to the supine position. The patient tolerated the procedure without any complications and taken to recovery in stable condition.   Janyth Pupa, DO [phone] (pager) [phone] (office)

## 2015-07-21 NOTE — Transfer of Care (Signed)
Immediate Anesthesia Transfer of Care Note  Patient: Audrey Peters  Procedure(s) Performed: Procedure(s): DILATATION AND CURETTAGE /HYSTEROSCOPY with myosure (N/A)  Patient Location: PACU  Anesthesia Type:General  Level of Consciousness: awake, alert  and oriented  Airway & Oxygen Therapy: Patient Spontanous Breathing and Patient connected to nasal cannula oxygen  Post-op Assessment: Report given to RN and Post -op Vital signs reviewed and stable  Post vital signs: Reviewed and stable  Last Vitals:  Filed Vitals:   07/21/15 1128  BP: 135/90  Pulse: 68  Temp: 36.6 C  Resp: 18    Complications: No apparent anesthesia complications

## 2015-07-21 NOTE — Anesthesia Procedure Notes (Signed)
Procedure Name: LMA Insertion Date/Time: 07/21/2015 2:03 PM Performed by: Elenore Paddy Pre-anesthesia Checklist: Patient identified, Emergency Drugs available, Suction available, Patient being monitored and Timeout performed Patient Re-evaluated:Patient Re-evaluated prior to inductionOxygen Delivery Method: Circle system utilized Preoxygenation: Pre-oxygenation with 100% oxygen Intubation Type: IV induction LMA: LMA with gastric port inserted LMA Size: 4.0 Tube type: Oral Number of attempts: 1 Placement Confirmation: positive ETCO2 and breath sounds checked- equal and bilateral Dental Injury: Teeth and Oropharynx as per pre-operative assessment

## 2015-07-21 NOTE — Discharge Instructions (Addendum)
HOME INSTRUCTIONS  Please note any unusual or excessive bleeding, pain, swelling. Mild dizziness or drowsiness are normal for about 24 hours after surgery.   Shower when comfortable  Restrictions: No driving for 24 hours or while taking pain medications.  Activity:  No heavy lifting (> 10 lbs), nothing in vagina (no tampons, douching, or intercourse) x 2 weeks; no tub baths for 2 weeks Vaginal spotting is expected but if your bleeding is heavy, period like,  please call the office   Incision: the bandaids will fall off when they are ready to; you may clean your incision with mild soap and water but do not rub or scrub the incision site.  You may experience slight bloody drainage from your incision periodically.  This is normal.  If you experience a large amount of drainage or the incision opens, please call your physician who will likely direct you to the emergency department.  Diet:  You may return to your regular diet.  Do not eat large meals.  Eat small frequent meals throughout the day.  Continue to drink a good amount of water at least 6-8 glasses of water per day, hydration is very important for the healing process.  Pain Management: Take Motrin and/or Tylenol as needed for pain.  Always take prescription pain medication with food, it may cause constipation, increase fluids and fiber and you may want to take an over-the-counter stool softener like Colace as needed up to 2x a day.    Alcohol -- Avoid for 24 hours and while taking pain medications.  Nausea: Take sips of ginger ale or soda  Fever -- Call physician if temperature over 101 degrees  Follow up:  If you do not already have a follow up appointment scheduled, please call the office at [phone].  If you experience fever (a temperature greater than 100.4), pain unrelieved by pain medication, shortness of breath, swelling of a single leg, or any other symptoms which are concerning to you please the office immediately.

## 2015-07-21 NOTE — Interval H&P Note (Signed)
History and Physical Interval Note:  07/21/2015 1:14 PM  Audrey Peters  has presented today for surgery, with the diagnosis of N95.0 Postmenopausal Bleeding  The various methods of treatment have been discussed with the patient and family. After consideration of risks, benefits and other options for treatment, the patient has consented to  Procedure(s): DILATATION AND CURETTAGE /HYSTEROSCOPY (N/A) as a surgical intervention .  The patient's history has been reviewed, patient examined, no change in status, stable for surgery.  I have reviewed the patient's chart and labs.  Questions were answered to the patient's satisfaction.     Janyth Pupa, M

## 2015-07-22 ENCOUNTER — Encounter (HOSPITAL_COMMUNITY): Payer: Self-pay | Admitting: Obstetrics & Gynecology

## 2016-04-10 ENCOUNTER — Other Ambulatory Visit: Payer: Self-pay | Admitting: Gastroenterology

## 2016-05-23 ENCOUNTER — Encounter (HOSPITAL_COMMUNITY): Payer: Self-pay | Admitting: *Deleted

## 2016-05-27 NOTE — Anesthesia Preprocedure Evaluation (Addendum)
Anesthesia Evaluation  Patient identified by MRN, date of birth, ID band Patient awake    Reviewed: Allergy & Precautions, NPO status , Patient's Chart, lab work & pertinent test results  History of Anesthesia Complications Negative for: history of anesthetic complications  Airway Mallampati: II  TM Distance: >3 FB Neck ROM: Full    Dental  (+) Teeth Intact   Pulmonary sleep apnea ,    breath sounds clear to auscultation       Cardiovascular hypertension,  Rhythm:Regular Rate:Normal     Neuro/Psych  Headaches,    GI/Hepatic negative GI ROS, Neg liver ROS,   Endo/Other  diabetes  Renal/GU negative Renal ROS     Musculoskeletal negative musculoskeletal ROS (+)   Abdominal (+) + obese,   Peds  Hematology  (+) anemia ,   Anesthesia Other Findings   Reproductive/Obstetrics                            Anesthesia Physical Anesthesia Plan  ASA: III  Anesthesia Plan: MAC   Post-op Pain Management:    Induction: Intravenous  Airway Management Planned: Natural Airway and Simple Face Mask  Additional Equipment:   Intra-op Plan:   Post-operative Plan:   Informed Consent:   Plan Discussed with: CRNA  Anesthesia Plan Comments:         Anesthesia Quick Evaluation

## 2016-05-28 ENCOUNTER — Ambulatory Visit (HOSPITAL_COMMUNITY): Payer: BC Managed Care – PPO | Admitting: Certified Registered Nurse Anesthetist

## 2016-05-28 ENCOUNTER — Encounter (HOSPITAL_COMMUNITY)
Admission: RE | Disposition: A | Discharge status: Home / Self Care | Payer: Self-pay | Source: Ambulatory Visit | Attending: Gastroenterology

## 2016-05-28 ENCOUNTER — Ambulatory Visit (HOSPITAL_COMMUNITY)
Admission: RE | Admit: 2016-05-28 | Discharge: 2016-05-28 | Disposition: A | Discharge status: Home / Self Care | Payer: BC Managed Care – PPO | Source: Ambulatory Visit | Attending: Gastroenterology | Admitting: Gastroenterology

## 2016-05-28 DIAGNOSIS — E669 Obesity, unspecified: Secondary | ICD-10-CM | POA: Diagnosis not present

## 2016-05-28 DIAGNOSIS — Z79899 Other long term (current) drug therapy: Secondary | ICD-10-CM | POA: Diagnosis not present

## 2016-05-28 DIAGNOSIS — D649 Anemia, unspecified: Secondary | ICD-10-CM | POA: Insufficient documentation

## 2016-05-28 DIAGNOSIS — E7800 Pure hypercholesterolemia, unspecified: Secondary | ICD-10-CM | POA: Diagnosis not present

## 2016-05-28 DIAGNOSIS — E119 Type 2 diabetes mellitus without complications: Secondary | ICD-10-CM | POA: Insufficient documentation

## 2016-05-28 DIAGNOSIS — Z6841 Body Mass Index (BMI) 40.0 and over, adult: Secondary | ICD-10-CM | POA: Diagnosis not present

## 2016-05-28 DIAGNOSIS — I10 Essential (primary) hypertension: Secondary | ICD-10-CM | POA: Insufficient documentation

## 2016-05-28 DIAGNOSIS — G4733 Obstructive sleep apnea (adult) (pediatric): Secondary | ICD-10-CM | POA: Diagnosis not present

## 2016-05-28 DIAGNOSIS — Z1211 Encounter for screening for malignant neoplasm of colon: Secondary | ICD-10-CM | POA: Diagnosis present

## 2016-05-28 HISTORY — DX: Headache, unspecified: R51.9

## 2016-05-28 HISTORY — PX: COLONOSCOPY WITH PROPOFOL: SHX5780

## 2016-05-28 HISTORY — DX: Headache: R51

## 2016-05-28 HISTORY — DX: Anemia, unspecified: D64.9

## 2016-05-28 LAB — GLUCOSE, CAPILLARY: GLUCOSE-CAPILLARY: 199 mg/dL — AB (ref 65–99)

## 2016-05-28 SURGERY — COLONOSCOPY WITH PROPOFOL
Anesthesia: Monitor Anesthesia Care

## 2016-05-28 MED ORDER — SODIUM CHLORIDE 0.9 % IV SOLN: INTRAVENOUS | Status: DC

## 2016-05-28 MED ORDER — LIDOCAINE 2% (20 MG/ML) 5 ML SYRINGE
INTRAMUSCULAR | Status: DC | PRN
  Administered 2016-05-28: 50 mg via INTRAVENOUS

## 2016-05-28 MED ORDER — ONDANSETRON HCL 4 MG/2ML IJ SOLN
INTRAMUSCULAR | Status: AC
  Filled 2016-05-28: qty 2

## 2016-05-28 MED ORDER — PROPOFOL 10 MG/ML IV BOLUS
INTRAVENOUS | Status: DC | PRN
  Administered 2016-05-28: 10 mg via INTRAVENOUS

## 2016-05-28 MED ORDER — PROPOFOL 10 MG/ML IV BOLUS
INTRAVENOUS | Status: AC
  Filled 2016-05-28: qty 40

## 2016-05-28 MED ORDER — ONDANSETRON HCL 4 MG/2ML IJ SOLN
INTRAMUSCULAR | Status: DC | PRN
  Administered 2016-05-28: 4 mg via INTRAVENOUS

## 2016-05-28 MED ORDER — PROPOFOL 500 MG/50ML IV EMUL
INTRAVENOUS | Status: DC | PRN
  Administered 2016-05-28: 125 ug/kg/min via INTRAVENOUS

## 2016-05-28 MED ORDER — LACTATED RINGERS IV SOLN
INTRAVENOUS | Status: DC
  Administered 2016-05-28: 1000 mL via INTRAVENOUS

## 2016-05-28 MED ORDER — LIDOCAINE 2% (20 MG/ML) 5 ML SYRINGE
INTRAMUSCULAR | Status: AC
  Filled 2016-05-28: qty 5

## 2016-05-28 SURGICAL SUPPLY — 21 items

## 2016-05-28 NOTE — Anesthesia Procedure Notes (Signed)
Procedure Name: MAC Date/Time: 05/28/2016 9:57 AM Performed by: West Pugh Pre-anesthesia Checklist: Patient identified, Timeout performed, Emergency Drugs available, Suction available and Patient being monitored Oxygen Delivery Method: Simple face mask Placement Confirmation: positive ETCO2 Dental Injury: Teeth and Oropharynx as per pre-operative assessment

## 2016-05-28 NOTE — Discharge Instructions (Signed)

## 2016-05-28 NOTE — Op Note (Signed)
[facility] Patient Name: Audrey Peters Procedure Date: 05/28/2016 MRN: [id number] Attending MD: Garlan Fair , MD Date of Birth: 1955-09-01 CSN: [id number] Age: 60 Admit Type: Outpatient Procedure:                Colonoscopy Indications:              Screening for colorectal malignant neoplasm Providers:                Garlan Fair, MD, Laverta Baltimore RN, RN, Alfonso Patten, Technician, Christell Faith, CRNA Referring MD:              Medicines:                Propofol per Anesthesia Complications:            No immediate complications. Estimated Blood Loss:     Estimated blood loss: none. Procedure:                Pre-Anesthesia Assessment:                           - Prior to the procedure, a History and Physical                            was performed, and patient medications and                            allergies were reviewed. The patient's tolerance of                            previous anesthesia was also reviewed. The risks                            and benefits of the procedure and the sedation                            options and risks were discussed with the patient.                            All questions were answered, and informed consent                            was obtained. Prior Anticoagulants: The patient has                            taken aspirin, last dose was 1 day prior to                            procedure. ASA Grade Assessment: III - A patient                            with severe systemic disease. After reviewing the  risks and benefits, the patient was deemed in                            satisfactory condition to undergo the procedure.                           After obtaining informed consent, the colonoscope                            was passed under direct vision. Throughout the                            procedure, the patient's blood pressure, pulse, and                      oxygen saturations were monitored continuously. The                            [id number] scope was introduced through                            the anus and advanced to the the cecum, identified                            by appendiceal orifice and ileocecal valve. The                            colonoscopy was performed without difficulty. The                            patient tolerated the procedure well. The quality                            of the bowel preparation was good. The appendiceal                            orifice and the rectum were photographed. Scope In: 10:00:13 AM Scope Out: 10:20:58 AM Scope Withdrawal Time: 0 hours 8 minutes 13 seconds  Total Procedure Duration: 0 hours 20 minutes 45 seconds  Findings:      The perianal and digital rectal examinations were normal.      The entire examined colon appeared normal. Impression:               - The entire examined colon is normal.                           - No specimens collected. Moderate Sedation:      N/A- Per Anesthesia Care Recommendation:           - Patient has a contact number available for                            emergencies. The signs and symptoms of potential  delayed complications were discussed with the                            patient. Return to normal activities tomorrow.                            Written discharge instructions were provided to the                            patient.                           - Repeat colonoscopy in 10 years for screening                            purposes.                           - Resume previous diet.                           - Continue present medications. Procedure Code(s):        --- Professional ---                           [id number], Colorectal cancer screening; colonoscopy on                            individual not meeting criteria for high risk Diagnosis Code(s):        --- Professional ---                            Z12.11, Encounter for screening for malignant                            neoplasm of colon CPT copyright 2016 [employer]. All rights reserved. The codes documented in this report are preliminary and upon coder review may  be revised to meet current compliance requirements. Earle Gell, MD Garlan Fair, MD 05/28/2016 10:26:02 AM This report has been signed electronically. Number of Addenda: 0

## 2016-05-28 NOTE — H&P (Signed)
Procedure: Screening colonoscopy. 04/07/2001 normal screening colonoscopy was performed  History: The patient is a 60 year old female born 25-Feb-1956. She is scheduled to undergo a screening colonoscopy today  Past medical history: Type 2 diabetes mellitus. Hypercholesterolemia. Hypertension. Ovarian cyst. Obstructive sleep apnea syndrome. Tonsillectomy. Hysteroscopy and D&C.  Medication allergies: Sulfa. Ciprofloxacin.  Exam: The patient is alert and lying comfortably on the endoscopy stretcher. Abdomen is soft and nontender to palpation. Lungs are clear to auscultation. Cardiac exam reveals a regular rhythm.  Plan: Proceed with screening colonoscopy

## 2016-05-28 NOTE — Anesthesia Postprocedure Evaluation (Signed)
Anesthesia Post Note  Patient: Audrey Peters  Procedure(s) Performed: Procedure(s) (LRB): COLONOSCOPY WITH PROPOFOL (N/A)  Patient location during evaluation: Endoscopy Anesthesia Type: MAC Level of consciousness: awake and alert Pain management: pain level controlled Vital Signs Assessment: post-procedure vital signs reviewed and stable Respiratory status: spontaneous breathing, nonlabored ventilation, respiratory function stable and patient connected to nasal cannula oxygen Cardiovascular status: stable and blood pressure returned to baseline Anesthetic complications: no    Last Vitals:  Vitals:   05/28/16 1028 05/28/16 1030  BP: 117/63 (P) 114/66  Pulse: 75   Resp: 19   Temp: 36.5 C     Last Pain:  Vitals:   05/28/16 1028  TempSrc: Oral                 November Sypher,JAMES TERRILL

## 2016-05-28 NOTE — Transfer of Care (Signed)
Immediate Anesthesia Transfer of Care Note  Patient: Audrey Peters  Procedure(s) Performed: Procedure(s): COLONOSCOPY WITH PROPOFOL (N/A)  Patient Location: PACU  Anesthesia Type:MAC  Level of Consciousness:  sedated, patient cooperative and responds to stimulation  Airway & Oxygen Therapy:Patient Spontanous Breathing and Patient connected to face mask oxgen  Post-op Assessment:  Report given to PACU RN and Post -op Vital signs reviewed and stable  Post vital signs:  Reviewed and stable  Last Vitals:  Vitals:   05/28/16 0836 05/28/16 1028  BP: 137/70 117/63  Pulse: 63 75  Resp: 17 19  Temp: 36.6 C [id number]    Complications: No apparent anesthesia complications

## 2016-05-29 ENCOUNTER — Encounter (HOSPITAL_COMMUNITY): Payer: Self-pay | Admitting: Gastroenterology

## 2016-11-16 ENCOUNTER — Other Ambulatory Visit (HOSPITAL_COMMUNITY)
Admission: RE | Admit: 2016-11-16 | Discharge: 2016-11-16 | Disposition: A | Discharge status: Home / Self Care | Payer: BC Managed Care – PPO | Source: Ambulatory Visit | Attending: Obstetrics and Gynecology | Admitting: Obstetrics and Gynecology

## 2016-11-16 ENCOUNTER — Other Ambulatory Visit: Payer: Self-pay | Admitting: Obstetrics & Gynecology

## 2016-11-16 DIAGNOSIS — Z01419 Encounter for gynecological examination (general) (routine) without abnormal findings: Secondary | ICD-10-CM | POA: Insufficient documentation

## 2016-11-21 LAB — CYTOLOGY - PAP: DIAGNOSIS: NEGATIVE

## 2016-12-06 IMAGING — MG MM SCREEN MAMMOGRAM BILATERAL
4 series · 4 of 4 positions shown · non-contrast
Comparison: Previous exam(s).

CLINICAL DATA: Screening.

EXAM:
DIGITAL SCREENING BILATERAL MAMMOGRAM WITH CAD

[R CC]
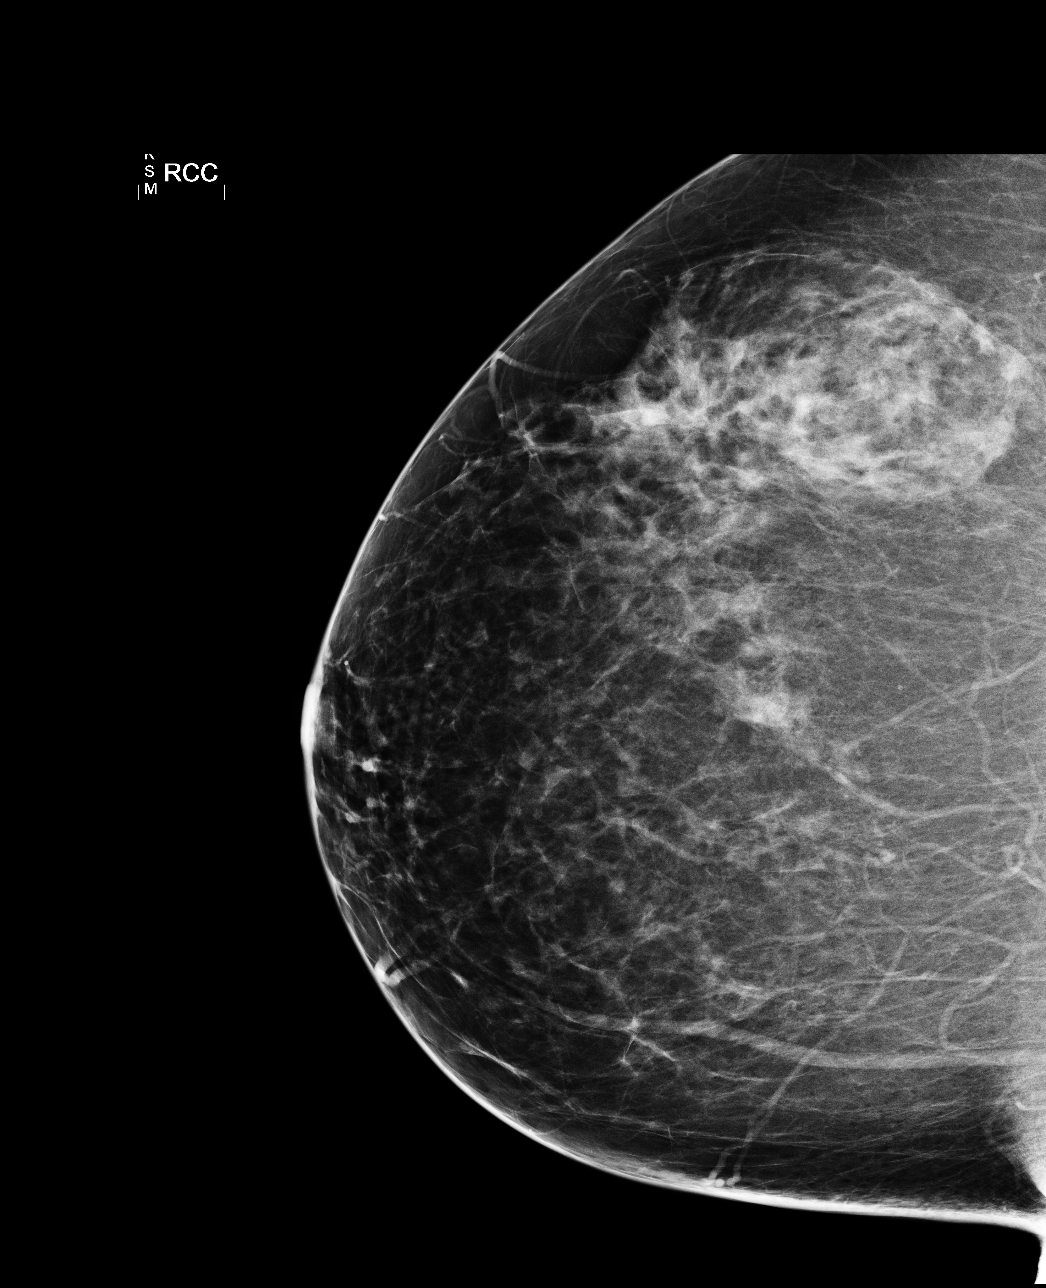

[L CC]
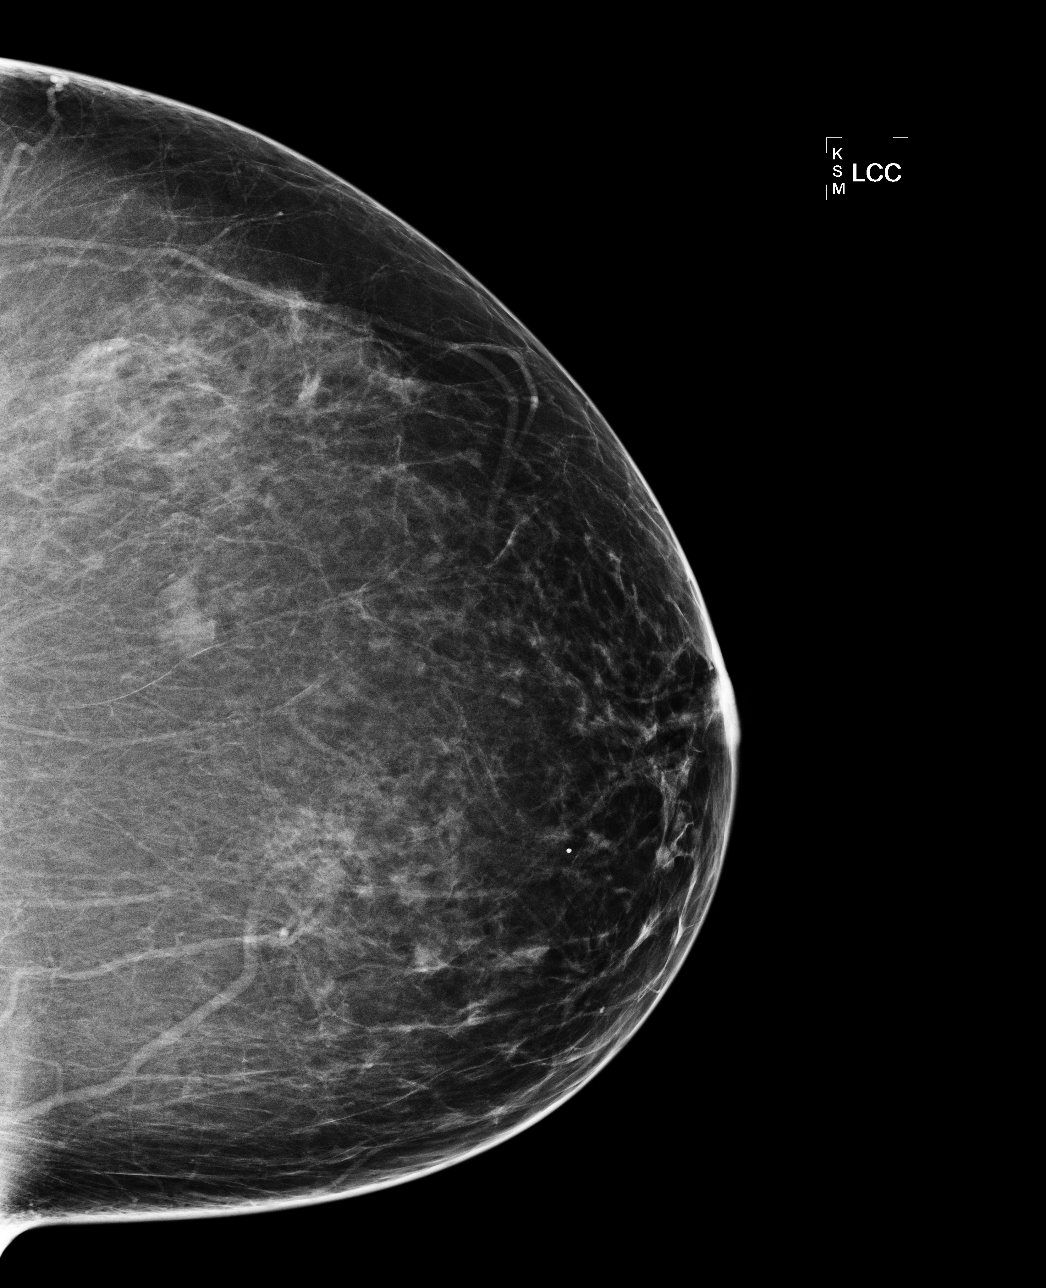

[L MLO]
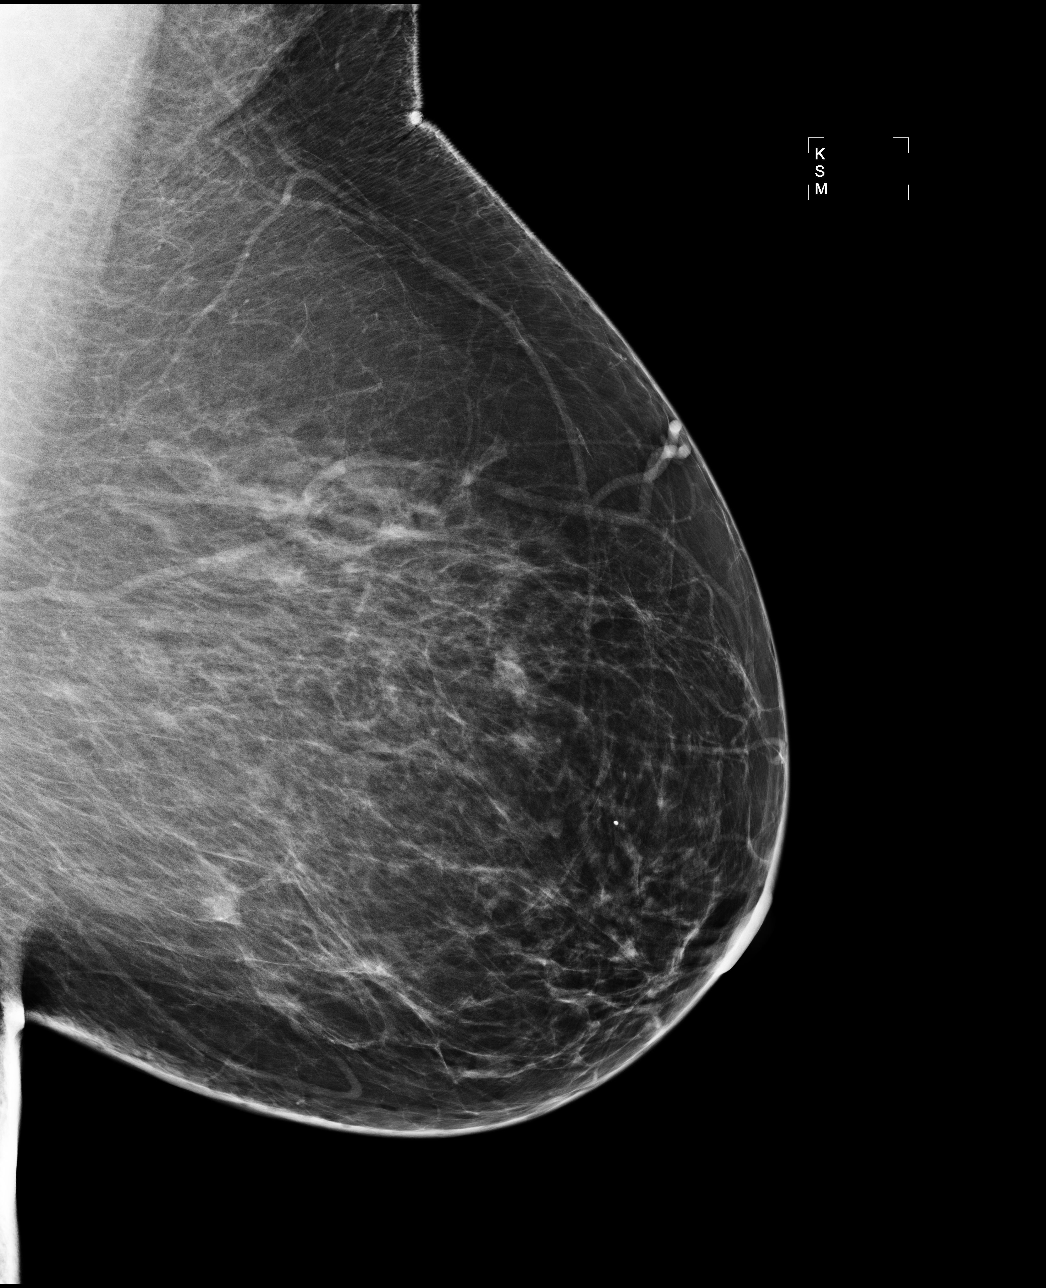

[R MLO]
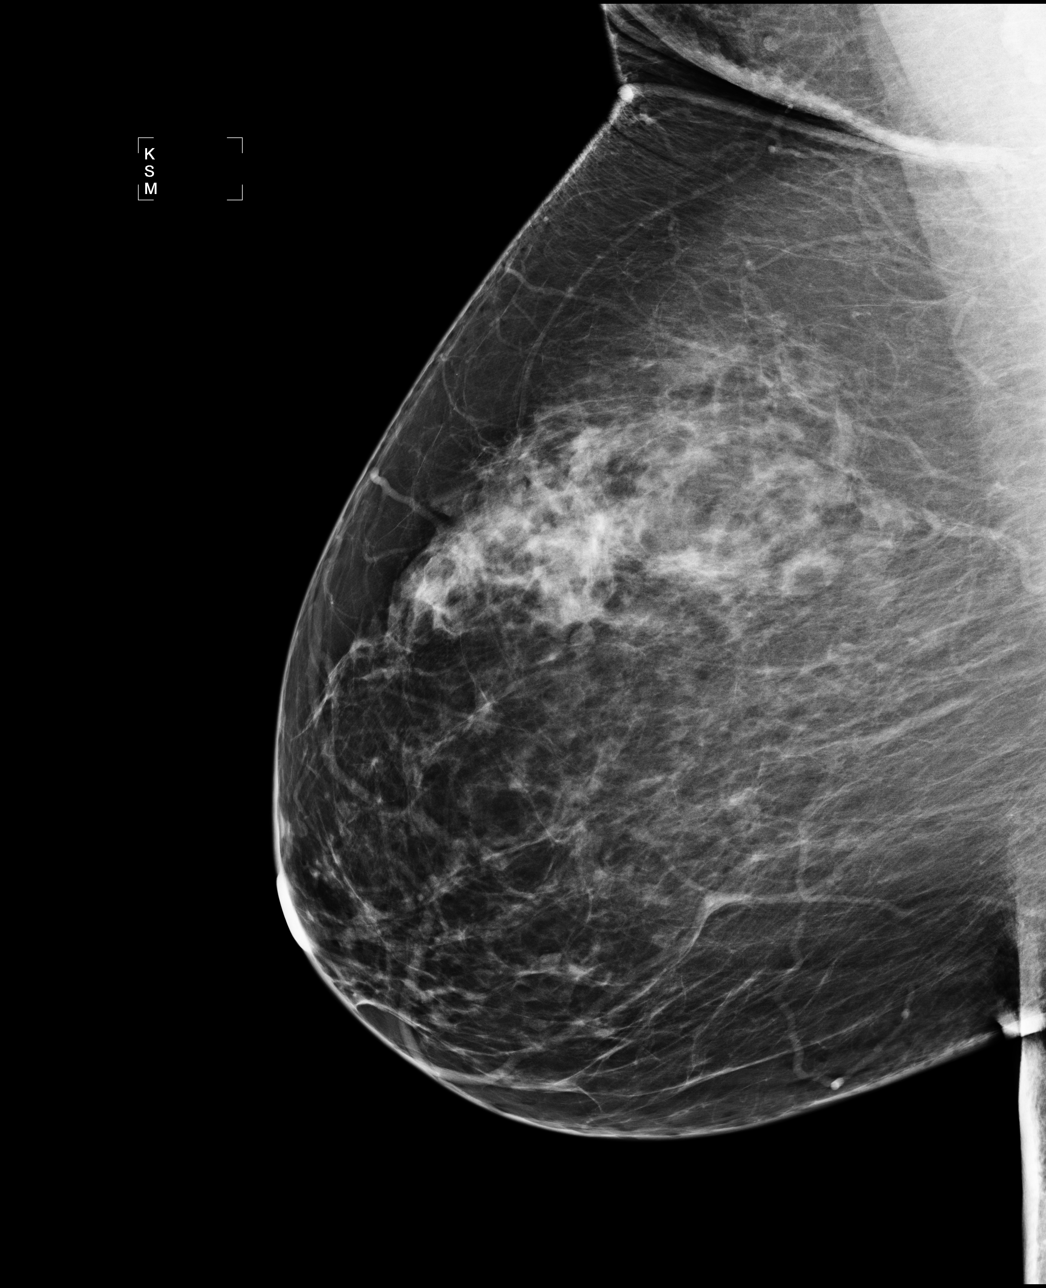

[4 of 4 positions shown; findings below may reference images not displayed]

ACR Breast Density Category b: There are scattered areas of
fibroglandular density.
FINDINGS: There are no findings suspicious for malignancy. Images were
processed with CAD.
IMPRESSION: No mammographic evidence of malignancy. A result letter of this
screening mammogram will be mailed directly to the patient.

RECOMMENDATION:
Screening mammogram in one year. (Code:AS-G-LCT)

BI-RADS CATEGORY  1: Negative.

## 2017-03-11 ENCOUNTER — Other Ambulatory Visit: Payer: Self-pay | Admitting: Internal Medicine

## 2017-03-11 DIAGNOSIS — Z1231 Encounter for screening mammogram for malignant neoplasm of breast: Secondary | ICD-10-CM

## 2017-03-28 ENCOUNTER — Ambulatory Visit
Admission: RE | Admit: 2017-03-28 | Discharge: 2017-03-28 | Disposition: A | Discharge status: Home / Self Care | Payer: BC Managed Care – PPO | Source: Ambulatory Visit | Attending: Internal Medicine | Admitting: Internal Medicine

## 2017-03-28 DIAGNOSIS — Z1231 Encounter for screening mammogram for malignant neoplasm of breast: Secondary | ICD-10-CM

## 2017-04-01 ENCOUNTER — Other Ambulatory Visit: Payer: Self-pay | Admitting: Internal Medicine

## 2017-04-01 DIAGNOSIS — R928 Other abnormal and inconclusive findings on diagnostic imaging of breast: Secondary | ICD-10-CM

## 2017-04-03 ENCOUNTER — Other Ambulatory Visit: Payer: Self-pay | Admitting: Internal Medicine

## 2017-04-03 ENCOUNTER — Ambulatory Visit
Admission: RE | Admit: 2017-04-03 | Discharge: 2017-04-03 | Disposition: A | Discharge status: Home / Self Care | Payer: BC Managed Care – PPO | Source: Ambulatory Visit | Attending: Internal Medicine | Admitting: Internal Medicine

## 2017-04-03 DIAGNOSIS — R928 Other abnormal and inconclusive findings on diagnostic imaging of breast: Secondary | ICD-10-CM

## 2017-04-04 ENCOUNTER — Other Ambulatory Visit: Payer: Self-pay | Admitting: Internal Medicine

## 2017-04-04 DIAGNOSIS — R928 Other abnormal and inconclusive findings on diagnostic imaging of breast: Secondary | ICD-10-CM

## 2017-04-05 ENCOUNTER — Ambulatory Visit
Admission: RE | Admit: 2017-04-05 | Discharge: 2017-04-05 | Disposition: A | Discharge status: Home / Self Care | Payer: BC Managed Care – PPO | Source: Ambulatory Visit | Attending: Internal Medicine | Admitting: Internal Medicine

## 2017-04-05 DIAGNOSIS — R928 Other abnormal and inconclusive findings on diagnostic imaging of breast: Secondary | ICD-10-CM

## 2017-04-09 ENCOUNTER — Ambulatory Visit: Payer: Self-pay | Admitting: Surgery

## 2017-04-09 DIAGNOSIS — C50912 Malignant neoplasm of unspecified site of left female breast: Secondary | ICD-10-CM

## 2017-04-09 NOTE — H&P (Signed)
Audrey Peters 04/09/2017 2:06 PM Location: [facility] Patient #: [id number] DOB: 09-23-1955 Married / Language: English / Race: Black or African American Female  History of Present Illness Marcello Moores A. Jeanee Fabre MD; 04/09/2017 2:38 PM) Patient words: Patient sent at the request of Dr. Margarette Canada for mammographic abnormality detected on screening mammogram. The patient went for any mammogram and a 1.3 cm mass was identified in the left breast in the upper outer quadrant. Core biopsy showed invasive ductal carcinoma. Receptors pending. Patient denies any history of breast pain, nipple discharge or breast mass. There is no family history of breast cancer. She has no other complaints except for some soreness in the right breast at the biopsy site.                    Study Result  CLINICAL DATA: The patient was called back from screening mammography due to a left breast asymmetry. EXAM: 2D DIGITAL DIAGNOSTIC LEFT MAMMOGRAM WITH CAD AND ADJUNCT TOMO ULTRASOUND LEFT BREAST COMPARISON: Previous exam(s). ACR Breast Density Category b: There are scattered areas of fibroglandular density. FINDINGS: There is an asymmetry in the superior left breast on the MLO view, not present on the CC view, which is new compared to more remote studies. No other interval changes or suspicious findings. Mammographic images were processed with CAD. On physical exam, no suspicious lumps are identified. Targeted ultrasound is performed, showing an irregular hypoechoic apparently solid mass in the left breast at 2:30, 10 cm from the nipple measuring 13 x 10 x 11 mm. No axillary adenopathy identified. IMPRESSION: Suspicious mass in the left breast. No other suspicious findings. RECOMMENDATION: Recommend ultrasound-guided biopsy of the suspicious left breast mass. I have discussed the findings and recommendations with the patient. Results were also provided in writing at the conclusion  of the visit. If applicable, a reminder letter will be sent to the patient regarding the next appointment. BI-RADS CATEGORY 4: Suspicious. Electronically Signed By: Dorise Bullion III M.D On: 04/03/2017 09:51        Diagnosis Breast, left, needle core biopsy, OU, 2:30 o'clock position - INVASIVE DUCTAL CARCINOMA, SEE COMMENT. Microscopic Comment The carcinoma appears grade 2. Prognostic markers will be ordered. Dr. Lyndon Code has reviewed the case. The case was called to [facility] on 04/08/2017. Vicente Males MD Pathologist, Electronic Signature (Case signed 04/08/2017) Specimen Gross and Clinical Information Specimen Comment TIF - 2:00 PM, extracted < 1 minute; suspicious 1.3cm mass in UO left breast Specimen(s) Obtained: Breast, left, needle core biopsy, OU, 2:30 o'clock position Gross Received in formalin (TIF 2pm, CIT less than one minute), labeled with the patient's name and "left breast 2:30" are four cores of tan-yellow soft tissue ranging from 1 to 2.1 cm . Entirely submitted in one cassette. (AK 04/05/2017) Report signed out from the following location(s) Technical Component was performed at [facility]. [address],[place] [id number]., Interpretation was performed at [address], [place], [place] [address]. CLIA #: [id number], 1 of 1.  The patient is a 61 year old female.   Diagnostic Studies History Malachy Moan, [place]; 04/09/2017 2:06 PM) Colonoscopy within last year Mammogram within last year Pap Smear 1-5 years ago  Allergies Malachy Moan, RMA; 04/09/2017 2:08 PM) Sulfa Antibiotics sulfa drugs CIPROFLOXACIN Irregular heart rate. Invokana *ANTIDIABETICS* Jardiance *ANTIDIABETICS*  Medication History Malachy Moan, RMA; 04/09/2017 2:09 PM) Clotrimazole-Betamethasone (1-0.05% Cream, External) Active. Estradiol (0.1MG /GM Cream,  Vaginal) Active. Losartan Potassium (100MG  Tablet,  Oral) Active. Meloxicam (15MG  Tablet, Oral) Active. MetFORMIN HCl (1000MG  Tablet, Oral) Active. Trulicity (1.5MG /0.5ML Soln Pen-inj, Subcutaneous) Active. HumuLIN R U-500 KwikPen (500UNIT/ML Soln Pen-inj, Subcutaneous) Active. OneTouch Ultra Blue (In Vitro) Active. Triamterene-HCTZ (37.5-25MG  Tablet, Oral) Active. Medications Reconciled  Social History Malachy Moan, [place]; 04/09/2017 2:06 PM) Alcohol use Occasional alcohol use. Caffeine use Carbonated beverages, Coffee. No drug use Tobacco use Never smoker.  Family History Malachy Moan, [place]; 04/09/2017 2:06 PM) Arthritis Mother. Diabetes Mellitus Mother.  Pregnancy / Birth History Malachy Moan, [place]; 04/09/2017 2:06 PM) Age at menarche 78 years. Age of menopause <46 Gravida 1 Maternal age 75-25 Para 1  Other Problems Malachy Moan, [place]; 04/09/2017 2:06 PM) Diabetes Mellitus High blood pressure Lump In Breast     Review of Systems Malachy Moan RMA; 04/09/2017 2:06 PM) General Present- Fatigue and Night Sweats. Not Present- Appetite Loss, Chills, Fever, Weight Gain and Weight Loss. HEENT Present- Sinus Pain and Wears glasses/contact lenses. Not Present- Earache, Hearing Loss, Hoarseness, Nose Bleed, Oral Ulcers, Ringing in the Ears, Seasonal Allergies, Sore Throat, Visual Disturbances and Yellow Eyes. Breast Present- Breast Mass and Breast Pain. Not Present- Nipple Discharge and Skin Changes. Cardiovascular Present- Swelling of Extremities. Not Present- Chest Pain, Difficulty Breathing Lying Down, Leg Cramps, Palpitations, Rapid Heart Rate and Shortness of Breath. Gastrointestinal Present- Bloating. Not Present- Abdominal Pain, Bloody Stool, Change in Bowel Habits, Chronic diarrhea, Constipation, Difficulty Swallowing, Excessive gas, Gets full quickly at meals, Hemorrhoids, Indigestion, Nausea, Rectal Pain and Vomiting. Female Genitourinary  Present- Nocturia. Not Present- Frequency, Painful Urination, Pelvic Pain and Urgency. Musculoskeletal Present- Joint Stiffness. Not Present- Back Pain, Joint Pain, Muscle Pain, Muscle Weakness and Swelling of Extremities. Neurological Present- Numbness and Tingling. Not Present- Decreased Memory, Fainting, Headaches, Seizures, Tremor, Trouble walking and Weakness. Endocrine Present- Hot flashes. Not Present- Cold Intolerance, Excessive Hunger, Hair Changes, Heat Intolerance and New Diabetes.  Vitals Malachy Moan RMA; 04/09/2017 2:10 PM) 04/09/2017 2:09 PM Weight: 241.6 lb Height: 65in Body Surface Area: 2.14 m Body Mass Index: 40.2 kg/m  Temp.: 98.71F  Pulse: 77 (Regular)  BP: 130/90 (Sitting, Left Arm, Standard)      Physical Exam (Imani Sherrin A. Ambrielle Kington MD; 04/09/2017 2:39 PM)  General Mental Status-Alert. General Appearance-Consistent with stated age. Hydration-Well hydrated. Voice-Normal.  Head and Neck Head-normocephalic, atraumatic with no lesions or palpable masses. Trachea-midline. Thyroid Gland Characteristics - normal size and consistency.  Breast Breast - Left-Symmetric, Non Tender, No Biopsy scars, no Dimpling, No Inflammation, No Lumpectomy scars, No Mastectomy scars, No Peau d' Orange. Breast - Right-Symmetric, Non Tender, No Biopsy scars, no Dimpling, No Inflammation, No Lumpectomy scars, No Mastectomy scars, No Peau d' Orange. Breast Lump-No Palpable Breast Mass. Note: mild swelling left breast at biopsy site  Cardiovascular Cardiovascular examination reveals -normal heart sounds, regular rate and rhythm with no murmurs and normal pedal pulses bilaterally.  Abdomen Inspection Inspection of the abdomen reveals - No Hernias. Skin - Scar - no surgical scars. Palpation/Percussion Palpation and Percussion of the abdomen reveal - Soft, Non Tender, No Rebound tenderness, No Rigidity (guarding) and No  hepatosplenomegaly. Auscultation Auscultation of the abdomen reveals - Bowel sounds normal.  Neurologic Neurologic evaluation reveals -alert and oriented x 3 with no impairment of recent or remote memory. Mental Status-Normal.  Lymphatic Head & Neck  General Head & Neck Lymphatics: Bilateral - Description - Normal. Axillary  General Axillary Region: Bilateral - Description - Normal. Tenderness - Non Tender.    Assessment & Plan (Raegen Tarpley A. Tienna Bienkowski MD; 04/09/2017 2:40 PM)  BREAST CANCER, LEFT (C50.912) Impression: Discussed breast conservation versus mastectomy and reconstruction. Pros and cons of each as well as additional treatments of each needed. Refer to medical and radiation oncology. The patient is opted for left breast partial mastectomy with sentinel lymph node mapping. Risk of lumpectomy include bleeding, infection, seroma, more surgery, use of seed/wire, wound care, cosmetic deformity and the need for other treatments, death , blood clots, death. Pt agrees to proceed. Risk of sentinel lymph node mapping include bleeding, infection, lymphedema, shoulder pain. stiffness, dye allergy. cosmetic deformity , blood clots, death, need for more surgery. Pt agres to proceed.  Current Plans You are being scheduled for surgery- Our schedulers will call you.  You should hear from our office's scheduling department within 5 working days about the location, date, and time of surgery. We try to make accommodations for patient's preferences in scheduling surgery, but sometimes the OR schedule or the surgeon's schedule prevents [place] from making those accommodations.  If you have not heard from our office [phone]) in 5 working days, call the office and ask for your surgeon's nurse.  If you have other questions about your diagnosis, plan, or surgery, call the office and ask for your surgeon's nurse.  We discussed the staging and pathophysiology of breast cancer. We discussed all of the  different options for treatment for breast cancer including surgery, chemotherapy, radiation therapy, Herceptin, and antiestrogen therapy. We discussed a sentinel lymph node biopsy as she does not appear to having lymph node involvement right now. We discussed the performance of that with injection of radioactive tracer and blue dye. We discussed that she would have an incision underneath her axillary hairline. We discussed that there is a bout a 10-20% chance of having a positive node with a sentinel lymph node biopsy and we will await the permanent pathology to make any other first further decisions in terms of her treatment. One of these options might be to return to the operating room to perform an axillary lymph node dissection. We discussed about a 1-2% risk lifetime of chronic shoulder pain as well as lymphedema associated with a sentinel lymph node biopsy. We discussed the options for treatment of the breast cancer which included lumpectomy versus a mastectomy. We discussed the performance of the lumpectomy with a wire placement. We discussed a 10-20% chance of a positive margin requiring reexcision in the operating room. We also discussed that she may need radiation therapy or antiestrogen therapy or both if she undergoes lumpectomy. We discussed the mastectomy and the postoperative care for that as well. We discussed that there is no difference in her survival whether she undergoes lumpectomy with radiation therapy or antiestrogen therapy versus a mastectomy. There is a slight difference in the local recurrence rate being 3-5% with lumpectomy and about 1% with a mastectomy. We discussed the risks of operation including bleeding, infection, possible reoperation. She understands her further therapy will be based on what her stages at the time of her operation.  Pt Education - flb breast cancer surgery: discussed with patient and provided information. Pt Education - ABC (After Breast Cancer) Class  Info: discussed with patient and provided information.

## 2017-04-10 ENCOUNTER — Other Ambulatory Visit: Payer: Self-pay | Admitting: Surgery

## 2017-04-10 DIAGNOSIS — C50912 Malignant neoplasm of unspecified site of left female breast: Secondary | ICD-10-CM

## 2017-04-19 ENCOUNTER — Telehealth: Payer: Self-pay | Admitting: *Deleted

## 2017-04-19 NOTE — Telephone Encounter (Signed)
Scheduled and confirmed appt to see Dr. Lindi Adie on 9/10 at [facility] and contact information provided.

## 2017-04-22 ENCOUNTER — Ambulatory Visit (HOSPITAL_BASED_OUTPATIENT_CLINIC_OR_DEPARTMENT_OTHER): Payer: BC Managed Care – PPO | Admitting: Hematology and Oncology

## 2017-04-22 ENCOUNTER — Ambulatory Visit: Payer: Self-pay | Admitting: Surgery

## 2017-04-22 ENCOUNTER — Telehealth: Payer: Self-pay | Admitting: Hematology and Oncology

## 2017-04-22 ENCOUNTER — Encounter: Payer: Self-pay | Admitting: *Deleted

## 2017-04-22 VITALS — BP 137/77 | HR 75 | Temp 97.8°F | Resp 18 | Wt 233.8 lb

## 2017-04-22 DIAGNOSIS — Z171 Estrogen receptor negative status [ER-]: Secondary | ICD-10-CM | POA: Diagnosis not present

## 2017-04-22 DIAGNOSIS — C50412 Malignant neoplasm of upper-outer quadrant of left female breast: Secondary | ICD-10-CM | POA: Diagnosis not present

## 2017-04-22 DIAGNOSIS — Z79899 Other long term (current) drug therapy: Principal | ICD-10-CM

## 2017-04-22 DIAGNOSIS — Z5181 Encounter for therapeutic drug level monitoring: Secondary | ICD-10-CM

## 2017-04-22 NOTE — Progress Notes (Signed)
START ON PATHWAY REGIMEN - Breast   Doxorubicin + Cyclophosphamide (AC):   A cycle is every 21 days:     Doxorubicin      Cyclophosphamide   **Always confirm dose/schedule in your pharmacy ordering system**    Paclitaxel 80 mg/m2 Weekly:   Administer weekly:     Paclitaxel   **Always confirm dose/schedule in your pharmacy ordering system**    Patient Characteristics: Postoperative without Neoadjuvant Therapy (Pathologic Staging), Invasive Disease, Adjuvant Therapy, HER2 Negative/Unknown/Equivocal, ER Negative/Unknown, Node Negative Therapeutic Status: Postoperative without Neoadjuvant Therapy (Pathologic Staging) AJCC Grade: G2 AJCC N Category: pN0 AJCC M Category: cM0 ER Status: Negative (-) AJCC 8 Stage Grouping: IB HER2 Status: Negative (-) Oncotype Dx Recurrence Score: Not Appropriate AJCC T Category: pT1c PR Status: Negative (-) Intent of Therapy: Curative Intent, Discussed with Patient 

## 2017-04-22 NOTE — Telephone Encounter (Signed)
lvm to inform pt of ECHO appt 9/11 at 0900 per sch msg

## 2017-04-22 NOTE — Assessment & Plan Note (Signed)
04/05/2017: Left breast asymmetry by ultrasound measured 1.3 cm at 2:30 position 10 cm from nipple, no axillary lymph nodes; biopsy IDC grade 2, ER 0%, PR 0%, HER-2 negative ratio 1.37, Ki-67 40%, T1c N0 stage IB AJCC 8   Pathology and radiology counseling: Discussed with the patient, the details of pathology including the type of breast cancer,the clinical staging, the significance of ER, PR and HER-2/neu receptors and the implications for treatment. After reviewing the pathology in detail, we proceeded to discuss the different treatment options between surgery, radiation, chemotherapy, antiestrogen therapies.  Recommendation: 1. Breast conserving surgery with sentinel lymph node biopsy 2. adjuvant chemotherapy with dose dense Adriamycin and Cytoxan 4 followed by Taxol weekly 12 3. Followed by radiation  Chemotherapy Counseling: I discussed the risks and benefits of chemotherapy including the risks of nausea/ vomiting, risk of infection from low WBC count, fatigue due to chemo or anemia, bruising or bleeding due to low platelets, mouth sores, loss/ change in taste and decreased appetite. Liver and kidney function will be monitored through out chemotherapy as abnormalities in liver and kidney function may be a side effect of treatment. Cardiac dysfunction due to Adriamycin was discussed in detail. Risk of permanent bone marrow dysfunction and leukemia due to chemo were also discussed.  Plan: 1. Port placement during surgery 2. Echocardiogram 3. Chemotherapy class  Return to clinic one week after surgery to discuss the final pathology report and to discuss initiation of chemotherapy

## 2017-04-22 NOTE — Progress Notes (Signed)
[facility] NOTE  Patient Care Team: [name], MD as PCP - General (Internal Medicine)  CHIEF COMPLAINTS/PURPOSE OF CONSULTATION:  Newly diagnosed breast cancer  HISTORY OF PRESENTING ILLNESS:  Audrey Peters 61 y.o. female is here because of recent diagnosis of left breast cancer. Patient underwent a routine screening mammogram the detected a left breast asymmetry measuring 1.3 cm at 2:30 position there she underwent ultrasound and a biopsy. The biopsy revealed invasive ductal carcinoma grade 2 there is triple negative with a Ki-67 of 40%. She has seen Dr. Brantley Stage she was referred to [place] to discuss adjuvant treatment options. Patient is here today accompanied by her friend from sister's network. She reports occasional tingling and discomfort  In the left breast. She is a diabetic and takes insulin.  I reviewed her records extensively and collaborated the history with the patient.  SUMMARY OF ONCOLOGIC HISTORY:   Malignant neoplasm of upper-outer quadrant of left breast in female, estrogen receptor negative ([place])   04/05/2017 Initial Diagnosis    Left breast asymmetry by ultrasound measured 1.3 cm at 2:30 position 10 cm from nipple, no axillary lymph nodes; biopsy IDC grade 2, ER 0%, PR 0%, HER-2 negative ratio 1.37, Ki-67 40%, T1c N0 stage IB AJCC 8       MEDICAL HISTORY:  Past Medical History:  Diagnosis Date  . Anemia yrs ago  . Diabetes mellitus without complication ([place])   . Headache    sinus  . Hypertension   . Sleep apnea    does not use cpap  . Vaginal delivery 1983    SURGICAL HISTORY: Past Surgical History:  Procedure Laterality Date  . COLONOSCOPY WITH PROPOFOL N/A 05/28/2016   Procedure: COLONOSCOPY WITH PROPOFOL;  Surgeon: Garlan Fair, MD;  Location: [facility];  Service: Endoscopy;  Laterality: N/A;  . DILATION AND CURETTAGE OF UTERUS    . HYSTEROSCOPY W/D&C N/A 07/21/2015   Procedure: DILATATION AND CURETTAGE /HYSTEROSCOPY with  myosure;  Surgeon: Janyth Pupa, DO;  Location: [facility];  Service: Gynecology;  Laterality: N/A;    SOCIAL HISTORY: Social History   Social History  . Marital status: Married    Spouse name: N/A  . Number of children: N/A  . Years of education: N/A   Occupational History  . Not on file.   Social History Main Topics  . Smoking status: Never Smoker  . Smokeless tobacco: Never Used  . Alcohol use 0.0 oz/week     Comment: occ wine  . Drug use: No  . Sexual activity: Not on file   Other Topics Concern  . Not on file   Social History Narrative  . No narrative on file    FAMILY HISTORY: Family History  Problem Relation Age of Onset  . Diabetes Mother   . Hypertension Mother     ALLERGIES:  is allergic to invokana [canagliflozin]; sulfa antibiotics; and ciprofloxacin.  MEDICATIONS:  Current Outpatient Prescriptions  Medication Sig Dispense Refill  . clotrimazole (LOTRIMIN) 1 % cream Apply 1 application topically daily.    . Dulaglutide (TRULICITY) 8.00 LK/9.1PH SOPN Inject into the skin once a week. On sunday    . ibuprofen (ADVIL,MOTRIN) 200 MG tablet Take 200 mg by mouth every 6 (six) hours as needed for headache or moderate pain.    Marland Kitchen insulin regular human CONCENTRATED (HUMULIN R U-500 KWIKPEN) 500 UNIT/ML kwikpen Inject 50-80 Units into the skin 3 (three) times daily with meals. 120 units in the morning. 70 units at lunch  and 30 units at night.    . losartan (COZAAR) 100 MG tablet Take 100 mg by mouth daily.    . metFORMIN (GLUCOPHAGE) 500 MG tablet Take by mouth 2 (two) times daily with a meal.    . ONE TOUCH ULTRA TEST test strip     . triamterene-hydrochlorothiazide (MAXZIDE-25) 37.5-25 MG per tablet Take 1 tablet by mouth daily.      No current facility-administered medications for this visit.     REVIEW OF SYSTEMS:   Constitutional: Denies fevers, chills or abnormal night sweats Eyes: Denies blurriness of vision, double vision or watery eyes Ears, nose,  mouth, throat, and face: Denies mucositis or sore throat Respiratory: Denies cough, dyspnea or wheezes Cardiovascular: Denies palpitation, chest discomfort or lower extremity swelling Gastrointestinal:  Denies nausea, heartburn or change in bowel habits Skin: Denies abnormal skin rashes Lymphatics: Denies new lymphadenopathy or easy bruising Neurological:Denies numbness, tingling or new weaknesses Behavioral/Psych: Mood is stable, no new changes  Breast:  Denies any palpable lumps or discharge,  Mild discomfort in bilateral breasts which is chronic All other systems were reviewed with the patient and are negative.  PHYSICAL EXAMINATION: ECOG PERFORMANCE STATUS: 1 - Symptomatic but completely ambulatory  Vitals:   04/22/17 0949  BP: 137/77  Pulse: 75  Resp: 18  Temp: 97.8 F (36.6 C)  SpO2: 99%   Filed Weights   04/22/17 0949  Weight: 233 lb 12.8 oz (106.1 kg)    GENERAL:alert, no distress and comfortable SKIN: skin color, texture, turgor are normal, no rashes or significant lesions EYES: normal, conjunctiva are pink and non-injected, sclera clear OROPHARYNX:no exudate, no erythema and lips, buccal mucosa, and tongue normal  NECK: supple, thyroid normal size, non-tender, without nodularity LYMPH:  no palpable lymphadenopathy in the cervical, axillary or inguinal LUNGS: clear to auscultation and percussion with normal breathing effort HEART: regular rate & rhythm and no murmurs and no lower extremity edema ABDOMEN:abdomen soft, non-tender and normal bowel sounds Musculoskeletal:no cyanosis of digits and no clubbing  PSYCH: alert & oriented x 3 with fluent speech NEURO: no focal motor/sensory deficits BREAST: No palpable nodules in breast. No palpable axillary or supraclavicular lymphadenopathy (exam performed in the presence of a chaperone)   LABORATORY DATA:  I have reviewed the data as listed Lab Results  Component Value Date   WBC 7.8 07/13/2015   HGB 12.7 07/13/2015    HCT 39.4 07/13/2015   MCV 86.0 07/13/2015   PLT 210 07/13/2015   Lab Results  Component Value Date   NA 140 07/13/2015   K 3.6 07/13/2015   CL 104 07/13/2015   CO2 27 07/13/2015    RADIOGRAPHIC STUDIES: I have personally reviewed the radiological reports and agreed with the findings in the report.  ASSESSMENT AND PLAN:  Malignant neoplasm of upper-outer quadrant of left breast in female, estrogen receptor negative ([place]) 04/05/2017: Left breast asymmetry by ultrasound measured 1.3 cm at 2:30 position 10 cm from nipple, no axillary lymph nodes; biopsy IDC grade 2, ER 0%, PR 0%, HER-2 negative ratio 1.37, Ki-67 40%, T1c N0 stage IB AJCC 8   Pathology and radiology counseling: Discussed with the patient, the details of pathology including the type of breast cancer,the clinical staging, the significance of ER, PR and HER-2/neu receptors and the implications for treatment. After reviewing the pathology in detail, we proceeded to discuss the different treatment options between surgery, radiation, chemotherapy, antiestrogen therapies.  Recommendation: 1. Breast conserving surgery with sentinel lymph node biopsy 2. adjuvant chemotherapy  with dose dense Adriamycin and Cytoxan 4 followed by Taxol weekly 12 ( cannot use steroids because patient is diabetic) 3. Followed by radiation  Chemotherapy Counseling: I discussed the risks and benefits of chemotherapy including the risks of nausea/ vomiting, risk of infection from low WBC count, fatigue due to chemo or anemia, bruising or bleeding due to low platelets, mouth sores, loss/ change in taste and decreased appetite. Liver and kidney function will be monitored through out chemotherapy as abnormalities in liver and kidney function may be a side effect of treatment. Cardiac dysfunction due to Adriamycin was discussed in detail. Risk of permanent bone marrow dysfunction and leukemia due to chemo were also discussed.  Plan: 1. Port placement during  surgery 2. Echocardiogram 3. Chemotherapy class  Patient currently works part-time. She is a retired Education officer, [employer] for 30 years.  Plan to start chemotherapy on 05/24/2017  Return to clinic on 05/24/2017 to start chemotherapy  All questions were answered. The patient knows to call the clinic with any problems, questions or concerns.    Rulon Eisenmenger, MD 04/22/17

## 2017-04-23 ENCOUNTER — Ambulatory Visit (HOSPITAL_COMMUNITY)
Admission: RE | Admit: 2017-04-23 | Discharge: 2017-04-23 | Disposition: A | Discharge status: Home / Self Care | Payer: BC Managed Care – PPO | Source: Ambulatory Visit | Attending: Hematology and Oncology | Admitting: Hematology and Oncology

## 2017-04-23 DIAGNOSIS — Z5181 Encounter for therapeutic drug level monitoring: Secondary | ICD-10-CM | POA: Diagnosis not present

## 2017-04-23 DIAGNOSIS — E119 Type 2 diabetes mellitus without complications: Secondary | ICD-10-CM | POA: Diagnosis not present

## 2017-04-23 DIAGNOSIS — Z79899 Other long term (current) drug therapy: Secondary | ICD-10-CM | POA: Insufficient documentation

## 2017-04-23 DIAGNOSIS — Z171 Estrogen receptor negative status [ER-]: Secondary | ICD-10-CM

## 2017-04-23 DIAGNOSIS — C50412 Malignant neoplasm of upper-outer quadrant of left female breast: Secondary | ICD-10-CM

## 2017-04-23 DIAGNOSIS — I119 Hypertensive heart disease without heart failure: Secondary | ICD-10-CM | POA: Insufficient documentation

## 2017-04-23 NOTE — Progress Notes (Signed)
  Echocardiogram 2D Echocardiogram has been performed.  Audrey Peters L Androw 04/23/2017, 9:35 AM

## 2017-04-24 NOTE — Pre-Procedure Instructions (Signed)
Audrey Peters  04/24/2017      [place], [place] [address] Phone: [phone] Fax: [phone]    Your procedure is scheduled on September 18  Report to [facility] at 1230 P.M.  Call this number if you have problems the morning of surgery:  [phone]   Remember:  Do not eat food or drink liquids after midnight.  Please complete your 8oz of Water that was given to you at your preadmission appointment by 1030am  on the day of your surgery  Continue all other medications as directed by your physician except follow these medication instructions before surgery   Take these medicines the morning of surgery with A SIP OF WATER NONE  7 days prior to surgery STOP taking any Aspirin, Aleve, Naproxen, Ibuprofen, Motrin, Advil, Goody's, BC's, all herbal medications, fish oil, and all vitamins meloxicam (MOBIC)   WHAT DO I DO ABOUT MY DIABETES MEDICATION?   Do not take oral diabetes medicines (pills) the morning of surgery. metFORMIN (GLUCOPHAGE) .   Marland Kitchen The day of surgery, do not take other diabetes injectables, including Byetta (exenatide), Bydureon (exenatide ER), Victoza (liraglutide), or Trulicity (dulaglutide).  . If your CBG is greater than 220 mg/dL, you may take  of your sliding scale (correction) dose of insulin. insulin regular human CONCENTRATED (HUMULIN R U-500 KWIKPEN)   How to Manage Your Diabetes Before and After Surgery  Why is it important to control my blood sugar before and after surgery? . Improving blood sugar levels before and after surgery helps healing and can limit problems. . A way of improving blood sugar control is eating a healthy diet by: o  Eating less sugar and carbohydrates o  Increasing activity/exercise o  Talking with your doctor about reaching your blood sugar goals . High blood sugars (greater than 180 mg/dL) can raise your risk  of infections and slow your recovery, so you will need to focus on controlling your diabetes during the weeks before surgery. . Make sure that the doctor who takes care of your diabetes knows about your planned surgery including the date and location.  How do I manage my blood sugar before surgery? . Check your blood sugar at least 4 times a day, starting 2 days before surgery, to make sure that the level is not too high or low. o Check your blood sugar the morning of your surgery when you wake up and every 2 hours until you get to the Short Stay unit. . If your blood sugar is less than 70 mg/dL, you will need to treat for low blood sugar: o Do not take insulin. o Treat a low blood sugar (less than 70 mg/dL) with  cup of clear juice (cranberry or apple), 4 glucose tablets, OR glucose gel. o Recheck blood sugar in 15 minutes after treatment (to make sure it is greater than 70 mg/dL). If your blood sugar is not greater than 70 mg/dL on recheck, call [phone] for further instructions. . Report your blood sugar to the short stay nurse when you get to Short Stay.  . If you are admitted to the hospital after surgery: o Your blood sugar will be checked by the staff and you will probably be given insulin after surgery (instead of oral diabetes medicines) to make sure you have good blood sugar levels. o The goal for blood sugar control after surgery is 80-180 mg/dL.  Do not wear jewelry, make-up or nail polish.  Do not wear lotions, powders, or perfumes, or deoderant.  Do not shave 48 hours prior to surgery.  Men may shave face and neck.  Do not bring valuables to the hospital.  [facility] is not responsible for any belongings or valuables.  Contacts, dentures or bridgework may not be worn into surgery.  Leave your suitcase in the car.  After surgery it may be brought to your room.  For patients admitted to the hospital, discharge time will be determined by your treatment team.  Patients  discharged the day of surgery will not be allowed to drive home.    Special instructions:   [facility]- Preparing For Surgery  Before surgery, you can play an important role. Because skin is not sterile, your skin needs to be as free of germs as possible. You can reduce the number of germs on your skin by washing with CHG (chlorahexidine gluconate) Soap before surgery.  CHG is an antiseptic cleaner which kills germs and bonds with the skin to continue killing germs even after washing.  Please do not use if you have an allergy to CHG or antibacterial soaps. If your skin becomes reddened/irritated stop using the CHG.  Do not shave (including legs and underarms) for at least 48 hours prior to first CHG shower. It is OK to shave your face.  Please follow these instructions carefully.   1. Shower the NIGHT BEFORE SURGERY and the MORNING OF SURGERY with CHG.   2. If you chose to wash your hair, wash your hair first as usual with your normal shampoo.  3. After you shampoo, rinse your hair and body thoroughly to remove the shampoo.  4. Use CHG as you would any other liquid soap. You can apply CHG directly to the skin and wash gently with a scrungie or a clean washcloth.   5. Apply the CHG Soap to your body ONLY FROM THE NECK DOWN.  Do not use on open wounds or open sores. Avoid contact with your eyes, ears, mouth and genitals (private parts). Wash genitals (private parts) with your normal soap.  6. Wash thoroughly, paying special attention to the area where your surgery will be performed.  7. Thoroughly rinse your body with warm water from the neck down.  8. DO NOT shower/wash with your normal soap after using and rinsing off the CHG Soap.  9. Pat yourself dry with a CLEAN TOWEL.   10. Wear CLEAN PAJAMAS   11. Place CLEAN SHEETS on your bed the night of your first shower and DO NOT SLEEP WITH PETS.    Day of Surgery: Do not apply any deodorants/lotions. Please wear clean clothes to the  hospital/surgery center.      Please read over the following fact sheets that you were given.

## 2017-04-25 ENCOUNTER — Encounter (HOSPITAL_COMMUNITY): Payer: Self-pay

## 2017-04-25 ENCOUNTER — Encounter (HOSPITAL_COMMUNITY)
Admission: RE | Admit: 2017-04-25 | Discharge: 2017-04-25 | Disposition: A | Discharge status: Home / Self Care | Payer: BC Managed Care – PPO | Source: Ambulatory Visit | Attending: Surgery | Admitting: Surgery

## 2017-04-25 DIAGNOSIS — C50912 Malignant neoplasm of unspecified site of left female breast: Secondary | ICD-10-CM | POA: Diagnosis not present

## 2017-04-25 DIAGNOSIS — Z01810 Encounter for preprocedural cardiovascular examination: Secondary | ICD-10-CM | POA: Insufficient documentation

## 2017-04-25 DIAGNOSIS — Z01812 Encounter for preprocedural laboratory examination: Secondary | ICD-10-CM | POA: Diagnosis present

## 2017-04-25 HISTORY — DX: Unspecified osteoarthritis, unspecified site: M19.90

## 2017-04-25 HISTORY — DX: Malignant (primary) neoplasm, unspecified: C80.1

## 2017-04-25 HISTORY — DX: Carpal tunnel syndrome, right upper limb: G56.01

## 2017-04-25 LAB — COMPREHENSIVE METABOLIC PANEL
ALT: 38 U/L (ref 14–54)
AST: 37 U/L (ref 15–41)
Albumin: 3.7 g/dL (ref 3.5–5.0)
Alkaline Phosphatase: 67 U/L (ref 38–126)
Anion gap: 6 (ref 5–15)
BILIRUBIN TOTAL: 0.5 mg/dL (ref 0.3–1.2)
BUN: 19 mg/dL (ref 6–20)
CHLORIDE: 105 mmol/L (ref 101–111)
CO2: 26 mmol/L (ref 22–32)
CREATININE: 0.93 mg/dL (ref 0.44–1.00)
Calcium: 8.9 mg/dL (ref 8.9–10.3)
Glucose, Bld: 199 mg/dL — ABNORMAL HIGH (ref 65–99)
Potassium: 3.5 mmol/L (ref 3.5–5.1)
Sodium: 137 mmol/L (ref 135–145)
TOTAL PROTEIN: 7.4 g/dL (ref 6.5–8.1)

## 2017-04-25 LAB — CBC WITH DIFFERENTIAL/PLATELET
BASOS ABS: 0 10*3/uL (ref 0.0–0.1)
Basophils Relative: 0 %
EOS PCT: 1 %
Eosinophils Absolute: 0.1 10*3/uL (ref 0.0–0.7)
HEMATOCRIT: 40.4 % (ref 36.0–46.0)
Hemoglobin: 12.7 g/dL (ref 12.0–15.0)
LYMPHS ABS: 3.2 10*3/uL (ref 0.7–4.0)
LYMPHS PCT: 39 %
MCH: 27.9 pg (ref 26.0–34.0)
MCHC: 31.4 g/dL (ref 30.0–36.0)
MCV: 88.6 fL (ref 78.0–100.0)
MONO ABS: 0.6 10*3/uL (ref 0.1–1.0)
Monocytes Relative: 7 %
Neutro Abs: 4.3 10*3/uL (ref 1.7–7.7)
Neutrophils Relative %: 53 %
PLATELETS: 207 10*3/uL (ref 150–400)
RBC: 4.56 MIL/uL (ref 3.87–5.11)
RDW: 12.8 % (ref 11.5–15.5)
WBC: 8.2 10*3/uL (ref 4.0–10.5)

## 2017-04-25 LAB — HEMOGLOBIN A1C
HEMOGLOBIN A1C: 7.6 % — AB (ref 4.8–5.6)
MEAN PLASMA GLUCOSE: 171.42 mg/dL

## 2017-04-25 LAB — GLUCOSE, CAPILLARY: GLUCOSE-CAPILLARY: 188 mg/dL — AB (ref 65–99)

## 2017-04-25 NOTE — Progress Notes (Addendum)
PCP is Dr. Alfonso Patten Polite  LOV 03/2017  Endocrinologist is Dr. Buddy Duty  LOV 03/2017 Denies any cardiac issues, no murmur, cp, sob Blood sugars run 188-220. Drawing A1C today. Echo  04/2017  "normal" was done as w/u for ? Chemo Had positive sleep study, but doesn't have equipment or mask

## 2017-04-29 ENCOUNTER — Ambulatory Visit
Admission: RE | Admit: 2017-04-29 | Discharge: 2017-04-29 | Disposition: A | Discharge status: Home / Self Care | Payer: BC Managed Care – PPO | Source: Ambulatory Visit | Attending: Surgery | Admitting: Surgery

## 2017-04-29 ENCOUNTER — Other Ambulatory Visit: Payer: Self-pay | Admitting: Surgery

## 2017-04-29 DIAGNOSIS — C50912 Malignant neoplasm of unspecified site of left female breast: Secondary | ICD-10-CM

## 2017-04-29 MED ORDER — DEXTROSE 5 % IV SOLN
3.0000 g | INTRAVENOUS | Status: AC
  Administered 2017-04-30: 3 g via INTRAVENOUS
  Filled 2017-04-29: qty 3000

## 2017-04-30 ENCOUNTER — Ambulatory Visit (HOSPITAL_COMMUNITY): Payer: BC Managed Care – PPO | Admitting: Certified Registered Nurse Anesthetist

## 2017-04-30 ENCOUNTER — Ambulatory Visit (HOSPITAL_COMMUNITY): Payer: BC Managed Care – PPO | Admitting: Emergency Medicine

## 2017-04-30 ENCOUNTER — Ambulatory Visit (HOSPITAL_COMMUNITY): Payer: BC Managed Care – PPO

## 2017-04-30 ENCOUNTER — Ambulatory Visit (HOSPITAL_COMMUNITY)
Admission: RE | Admit: 2017-04-30 | Discharge: 2017-04-30 | Disposition: A | Discharge status: Home / Self Care | Payer: BC Managed Care – PPO | Source: Ambulatory Visit | Attending: Surgery | Admitting: Surgery

## 2017-04-30 ENCOUNTER — Encounter (HOSPITAL_COMMUNITY): Payer: Self-pay | Admitting: Certified Registered Nurse Anesthetist

## 2017-04-30 ENCOUNTER — Ambulatory Visit
Admission: RE | Admit: 2017-04-30 | Discharge: 2017-04-30 | Disposition: A | Discharge status: Home / Self Care | Payer: BC Managed Care – PPO | Source: Ambulatory Visit | Attending: Surgery | Admitting: Surgery

## 2017-04-30 ENCOUNTER — Encounter (HOSPITAL_COMMUNITY)
Admission: RE | Admit: 2017-04-30 | Discharge: 2017-04-30 | Disposition: A | Discharge status: Home / Self Care | Payer: BC Managed Care – PPO | Source: Ambulatory Visit | Attending: Surgery | Admitting: Surgery

## 2017-04-30 ENCOUNTER — Encounter (HOSPITAL_COMMUNITY)
Admission: RE | Disposition: A | Discharge status: Home / Self Care | Payer: Self-pay | Source: Ambulatory Visit | Attending: Surgery

## 2017-04-30 DIAGNOSIS — C50912 Malignant neoplasm of unspecified site of left female breast: Secondary | ICD-10-CM

## 2017-04-30 DIAGNOSIS — C50412 Malignant neoplasm of upper-outer quadrant of left female breast: Secondary | ICD-10-CM | POA: Insufficient documentation

## 2017-04-30 DIAGNOSIS — E119 Type 2 diabetes mellitus without complications: Secondary | ICD-10-CM | POA: Insufficient documentation

## 2017-04-30 DIAGNOSIS — Z79899 Other long term (current) drug therapy: Secondary | ICD-10-CM | POA: Diagnosis not present

## 2017-04-30 DIAGNOSIS — Z7984 Long term (current) use of oral hypoglycemic drugs: Secondary | ICD-10-CM | POA: Insufficient documentation

## 2017-04-30 DIAGNOSIS — I10 Essential (primary) hypertension: Secondary | ICD-10-CM | POA: Insufficient documentation

## 2017-04-30 DIAGNOSIS — Z419 Encounter for procedure for purposes other than remedying health state, unspecified: Secondary | ICD-10-CM

## 2017-04-30 DIAGNOSIS — Z95828 Presence of other vascular implants and grafts: Secondary | ICD-10-CM

## 2017-04-30 HISTORY — PX: PORTACATH PLACEMENT: SHX2246

## 2017-04-30 HISTORY — PX: BREAST LUMPECTOMY WITH RADIOACTIVE SEED AND SENTINEL LYMPH NODE BIOPSY: SHX6550

## 2017-04-30 LAB — GLUCOSE, CAPILLARY: GLUCOSE-CAPILLARY: 137 mg/dL — AB (ref 65–99)

## 2017-04-30 SURGERY — BREAST LUMPECTOMY WITH RADIOACTIVE SEED AND SENTINEL LYMPH NODE BIOPSY
Anesthesia: General | Site: Breast | Laterality: Right

## 2017-04-30 MED ORDER — GABAPENTIN 300 MG PO CAPS
ORAL_CAPSULE | ORAL | Status: AC
  Administered 2017-04-30: 300 mg
  Filled 2017-04-30: qty 1

## 2017-04-30 MED ORDER — MIDAZOLAM HCL 2 MG/2ML IJ SOLN
1.0000 mg | Freq: Once | INTRAMUSCULAR | Status: AC
  Administered 2017-04-30: 1 mg via INTRAVENOUS

## 2017-04-30 MED ORDER — MIDAZOLAM HCL 5 MG/5ML IJ SOLN
INTRAMUSCULAR | Status: DC | PRN
  Administered 2017-04-30: 1 mg via INTRAVENOUS

## 2017-04-30 MED ORDER — CHLORHEXIDINE GLUCONATE CLOTH 2 % EX PADS: 6.0000 | MEDICATED_PAD | Freq: Once | CUTANEOUS | Status: DC

## 2017-04-30 MED ORDER — FENTANYL CITRATE (PF) 100 MCG/2ML IJ SOLN
INTRAMUSCULAR | Status: AC
  Administered 2017-04-30: 100 ug via INTRAVENOUS
  Filled 2017-04-30: qty 2

## 2017-04-30 MED ORDER — HYDROMORPHONE HCL 1 MG/ML IJ SOLN
0.2500 mg | INTRAMUSCULAR | Status: DC | PRN
  Administered 2017-04-30 (×2): 0.5 mg via INTRAVENOUS

## 2017-04-30 MED ORDER — HEPARIN SOD (PORK) LOCK FLUSH 100 UNIT/ML IV SOLN
INTRAVENOUS | Status: AC
  Filled 2017-04-30: qty 5

## 2017-04-30 MED ORDER — SODIUM CHLORIDE 0.9 % IV SOLN
INTRAVENOUS | Status: DC | PRN
  Administered 2017-04-30: 500 mL

## 2017-04-30 MED ORDER — LACTATED RINGERS IV SOLN: INTRAVENOUS | Status: DC

## 2017-04-30 MED ORDER — LIDOCAINE 2% (20 MG/ML) 5 ML SYRINGE
INTRAMUSCULAR | Status: DC | PRN
  Administered 2017-04-30: 40 mg via INTRAVENOUS

## 2017-04-30 MED ORDER — IBUPROFEN 800 MG PO TABS: 800.0000 mg | ORAL_TABLET | Freq: Three times a day (TID) | ORAL | 0 refills | Status: DC | PRN

## 2017-04-30 MED ORDER — DEXAMETHASONE SODIUM PHOSPHATE 4 MG/ML IJ SOLN
INTRAMUSCULAR | Status: DC | PRN
  Administered 2017-04-30: 5 mg via INTRAVENOUS

## 2017-04-30 MED ORDER — PROPOFOL 10 MG/ML IV BOLUS
INTRAVENOUS | Status: AC
  Filled 2017-04-30: qty 20

## 2017-04-30 MED ORDER — DEXTROSE 5 % IV SOLN
INTRAVENOUS | Status: DC | PRN
  Administered 2017-04-30: 40 ug/min via INTRAVENOUS

## 2017-04-30 MED ORDER — ONDANSETRON HCL 4 MG/2ML IJ SOLN
INTRAMUSCULAR | Status: DC | PRN
  Administered 2017-04-30: 4 mg via INTRAVENOUS

## 2017-04-30 MED ORDER — TECHNETIUM TC 99M SULFUR COLLOID FILTERED
1.0000 | Freq: Once | INTRAVENOUS | Status: AC | PRN
  Administered 2017-04-30: 1 via INTRADERMAL

## 2017-04-30 MED ORDER — PROPOFOL 10 MG/ML IV BOLUS
INTRAVENOUS | Status: DC | PRN
  Administered 2017-04-30: 150 mg via INTRAVENOUS

## 2017-04-30 MED ORDER — DEXTROSE 5 % IV SOLN: 3.0000 g | INTRAVENOUS | Status: DC

## 2017-04-30 MED ORDER — HEPARIN SOD (PORK) LOCK FLUSH 100 UNIT/ML IV SOLN
INTRAVENOUS | Status: DC | PRN
  Administered 2017-04-30: 500 [IU] via INTRAVENOUS

## 2017-04-30 MED ORDER — PHENYLEPHRINE 40 MCG/ML (10ML) SYRINGE FOR IV PUSH (FOR BLOOD PRESSURE SUPPORT)
PREFILLED_SYRINGE | INTRAVENOUS | Status: DC | PRN
  Administered 2017-04-30 (×5): 80 ug via INTRAVENOUS

## 2017-04-30 MED ORDER — FENTANYL CITRATE (PF) 250 MCG/5ML IJ SOLN
INTRAMUSCULAR | Status: AC
  Filled 2017-04-30: qty 5

## 2017-04-30 MED ORDER — LACTATED RINGERS IV SOLN
INTRAVENOUS | Status: DC
  Administered 2017-04-30: 13:00:00 via INTRAVENOUS

## 2017-04-30 MED ORDER — BUPIVACAINE-EPINEPHRINE (PF) 0.25% -1:200000 IJ SOLN
INTRAMUSCULAR | Status: AC
  Filled 2017-04-30: qty 30

## 2017-04-30 MED ORDER — MIDAZOLAM HCL 2 MG/2ML IJ SOLN
INTRAMUSCULAR | Status: AC
  Filled 2017-04-30: qty 2

## 2017-04-30 MED ORDER — SODIUM CHLORIDE 0.9 % IJ SOLN
INTRAMUSCULAR | Status: AC
  Filled 2017-04-30: qty 10

## 2017-04-30 MED ORDER — METHYLENE BLUE 0.5 % INJ SOLN
INTRAVENOUS | Status: AC
  Filled 2017-04-30: qty 10

## 2017-04-30 MED ORDER — BUPIVACAINE-EPINEPHRINE 0.25% -1:200000 IJ SOLN
INTRAMUSCULAR | Status: DC | PRN
  Administered 2017-04-30 (×2): 10 mL

## 2017-04-30 MED ORDER — LIDOCAINE 2% (20 MG/ML) 5 ML SYRINGE
INTRAMUSCULAR | Status: AC
  Filled 2017-04-30: qty 5

## 2017-04-30 MED ORDER — ACETAMINOPHEN 500 MG PO TABS
1000.0000 mg | ORAL_TABLET | ORAL | Status: AC
  Administered 2017-04-30: 1000 mg via ORAL

## 2017-04-30 MED ORDER — EPHEDRINE 5 MG/ML INJ
INTRAVENOUS | Status: AC
  Filled 2017-04-30: qty 10

## 2017-04-30 MED ORDER — ONDANSETRON HCL 4 MG/2ML IJ SOLN
INTRAMUSCULAR | Status: AC
  Filled 2017-04-30: qty 2

## 2017-04-30 MED ORDER — GABAPENTIN 300 MG PO CAPS
300.0000 mg | ORAL_CAPSULE | ORAL | Status: DC
Start: 2017-05-01 — End: 2017-04-30

## 2017-04-30 MED ORDER — HEMOSTATIC AGENTS (NO CHARGE) OPTIME
TOPICAL | Status: DC | PRN
  Administered 2017-04-30: 1 via TOPICAL

## 2017-04-30 MED ORDER — HYDROMORPHONE HCL 1 MG/ML IJ SOLN
INTRAMUSCULAR | Status: AC
  Filled 2017-04-30: qty 1

## 2017-04-30 MED ORDER — PHENYLEPHRINE 40 MCG/ML (10ML) SYRINGE FOR IV PUSH (FOR BLOOD PRESSURE SUPPORT)
PREFILLED_SYRINGE | INTRAVENOUS | Status: AC
  Filled 2017-04-30: qty 10

## 2017-04-30 MED ORDER — PROMETHAZINE HCL 25 MG/ML IJ SOLN: 6.2500 mg | INTRAMUSCULAR | Status: DC | PRN

## 2017-04-30 MED ORDER — DEXAMETHASONE SODIUM PHOSPHATE 10 MG/ML IJ SOLN
INTRAMUSCULAR | Status: AC
  Filled 2017-04-30: qty 1

## 2017-04-30 MED ORDER — BUPIVACAINE-EPINEPHRINE (PF) 0.5% -1:200000 IJ SOLN
INTRAMUSCULAR | Status: DC | PRN
  Administered 2017-04-30: 30 mL

## 2017-04-30 MED ORDER — 0.9 % SODIUM CHLORIDE (POUR BTL) OPTIME
TOPICAL | Status: DC | PRN
  Administered 2017-04-30: 1000 mL

## 2017-04-30 MED ORDER — MIDAZOLAM HCL 2 MG/2ML IJ SOLN
INTRAMUSCULAR | Status: AC
  Administered 2017-04-30: 1 mg via INTRAVENOUS
  Filled 2017-04-30: qty 2

## 2017-04-30 MED ORDER — SODIUM CHLORIDE 0.9 % IJ SOLN
INTRAVENOUS | Status: DC | PRN
  Administered 2017-04-30: 15:00:00

## 2017-04-30 MED ORDER — ACETAMINOPHEN 500 MG PO TABS
ORAL_TABLET | ORAL | Status: AC
  Administered 2017-04-30: 1000 mg via ORAL
  Filled 2017-04-30: qty 2

## 2017-04-30 MED ORDER — FENTANYL CITRATE (PF) 100 MCG/2ML IJ SOLN
INTRAMUSCULAR | Status: DC | PRN
  Administered 2017-04-30 (×4): 25 ug via INTRAVENOUS

## 2017-04-30 MED ORDER — OXYCODONE HCL 5 MG PO TABS: 5.0000 mg | ORAL_TABLET | Freq: Four times a day (QID) | ORAL | 0 refills | Status: DC | PRN

## 2017-04-30 MED ORDER — EPHEDRINE SULFATE-NACL 50-0.9 MG/10ML-% IV SOSY
PREFILLED_SYRINGE | INTRAVENOUS | Status: DC | PRN
  Administered 2017-04-30: 5 mg via INTRAVENOUS

## 2017-04-30 MED ORDER — FENTANYL CITRATE (PF) 100 MCG/2ML IJ SOLN
100.0000 ug | Freq: Once | INTRAMUSCULAR | Status: AC
  Administered 2017-04-30: 100 ug via INTRAVENOUS

## 2017-04-30 SURGICAL SUPPLY — 86 items
ADH SKN CLS APL DERMABOND .7 (GAUZE/BANDAGES/DRESSINGS) ×4
APPLIER CLIP 9.375 MED OPEN (MISCELLANEOUS) ×4
APR CLP MED 9.3 20 MLT OPN (MISCELLANEOUS) ×2
BAG DECANTER FOR FLEXI CONT (MISCELLANEOUS) ×4 IMPLANT
BINDER BREAST LRG (GAUZE/BANDAGES/DRESSINGS) IMPLANT
BINDER BREAST XLRG (GAUZE/BANDAGES/DRESSINGS) IMPLANT
BINDER BREAST XXLRG (GAUZE/BANDAGES/DRESSINGS) ×2 IMPLANT
BIOPATCH RED 1 DISK 7.0 (GAUZE/BANDAGES/DRESSINGS) ×1 IMPLANT
BIOPATCH RED 1IN DISK 7.0MM (GAUZE/BANDAGES/DRESSINGS) ×1
BLADE SURG 11 STRL SS (BLADE) ×4 IMPLANT
BLADE SURG 15 STRL LF DISP TIS (BLADE) ×3 IMPLANT
BLADE SURG 15 STRL SS (BLADE) ×8
CANISTER SUCT 3000ML PPV (MISCELLANEOUS) ×4 IMPLANT
CHLORAPREP W/TINT 26ML (MISCELLANEOUS) ×4 IMPLANT
CLIP APPLIE 9.375 MED OPEN (MISCELLANEOUS) ×2 IMPLANT
CONT SPEC 4OZ CLIKSEAL STRL BL (MISCELLANEOUS) ×6 IMPLANT
COVER PROBE W GEL 5X96 (DRAPES) ×4 IMPLANT
COVER SURGICAL LIGHT HANDLE (MISCELLANEOUS) ×4 IMPLANT
COVER TRANSDUCER ULTRASND GEL (DRAPE) ×4 IMPLANT
CRADLE DONUT ADULT HEAD (MISCELLANEOUS) ×4 IMPLANT
DERMABOND ADVANCED (GAUZE/BANDAGES/DRESSINGS) ×4
DERMABOND ADVANCED .7 DNX12 (GAUZE/BANDAGES/DRESSINGS) ×2 IMPLANT
DEVICE DUBIN SPECIMEN MAMMOGRA (MISCELLANEOUS) ×4 IMPLANT
DRAIN CHANNEL 19F RND (DRAIN) ×2 IMPLANT
DRAPE C-ARM 42X72 X-RAY (DRAPES) ×4 IMPLANT
DRAPE CHEST BREAST 15X10 FENES (DRAPES) ×2 IMPLANT
DRAPE LAPAROSCOPIC ABDOMINAL (DRAPES) ×4 IMPLANT
DRAPE UTILITY XL STRL (DRAPES) ×8 IMPLANT
DRSG PAD ABDOMINAL 8X10 ST (GAUZE/BANDAGES/DRESSINGS) ×2 IMPLANT
DRSG TEGADERM 4X4.75 (GAUZE/BANDAGES/DRESSINGS) ×4 IMPLANT
ELECT CAUTERY BLADE 6.4 (BLADE) ×4 IMPLANT
ELECT REM PT RETURN 9FT ADLT (ELECTROSURGICAL) ×4
ELECTRODE REM PT RTRN 9FT ADLT (ELECTROSURGICAL) ×2 IMPLANT
EVACUATOR SILICONE 100CC (DRAIN) ×2 IMPLANT
GAUZE SPONGE 4X4 12PLY STRL (GAUZE/BANDAGES/DRESSINGS) ×3 IMPLANT
GAUZE SPONGE 4X4 16PLY XRAY LF (GAUZE/BANDAGES/DRESSINGS) ×4 IMPLANT
GEL ULTRASOUND 20GR AQUASONIC (MISCELLANEOUS) IMPLANT
GLOVE BIO SURGEON STRL SZ8 (GLOVE) ×4 IMPLANT
GLOVE BIOGEL PI IND STRL 6 (GLOVE) IMPLANT
GLOVE BIOGEL PI IND STRL 7.0 (GLOVE) IMPLANT
GLOVE BIOGEL PI IND STRL 8 (GLOVE) ×2 IMPLANT
GLOVE BIOGEL PI INDICATOR 6 (GLOVE) ×2
GLOVE BIOGEL PI INDICATOR 7.0 (GLOVE) ×2
GLOVE BIOGEL PI INDICATOR 8 (GLOVE) ×2
GLOVE INDICATOR 6.5 STRL GRN (GLOVE) ×2 IMPLANT
GOWN STRL REUS W/ TWL LRG LVL3 (GOWN DISPOSABLE) ×2 IMPLANT
GOWN STRL REUS W/ TWL XL LVL3 (GOWN DISPOSABLE) ×2 IMPLANT
GOWN STRL REUS W/TWL LRG LVL3 (GOWN DISPOSABLE) ×4
GOWN STRL REUS W/TWL XL LVL3 (GOWN DISPOSABLE) ×4
HEMOSTAT SNOW SURGICEL 2X4 (HEMOSTASIS) ×2 IMPLANT
INTRODUCER COOK 11FR (CATHETERS) IMPLANT
KIT BASIN OR (CUSTOM PROCEDURE TRAY) ×4 IMPLANT
KIT MARKER MARGIN INK (KITS) ×4 IMPLANT
KIT PORT POWER 8FR ISP CVUE (Miscellaneous) ×4 IMPLANT
KIT ROOM TURNOVER OR (KITS) ×4 IMPLANT
LIGHT WAVEGUIDE WIDE FLAT (MISCELLANEOUS) ×4 IMPLANT
NDL 18GX1X1/2 (RX/OR ONLY) (NEEDLE) IMPLANT
NDL FILTER BLUNT 18X1 1/2 (NEEDLE) IMPLANT
NDL HYPO 25GX1X1/2 BEV (NEEDLE) ×1 IMPLANT
NEEDLE 18GX1X1/2 (RX/OR ONLY) (NEEDLE) IMPLANT
NEEDLE FILTER BLUNT 18X 1/2SAF (NEEDLE)
NEEDLE FILTER BLUNT 18X1 1/2 (NEEDLE) IMPLANT
NEEDLE HYPO 25GX1X1/2 BEV (NEEDLE) ×8 IMPLANT
NS IRRIG 1000ML POUR BTL (IV SOLUTION) ×4 IMPLANT
PACK SURGICAL SETUP 50X90 (CUSTOM PROCEDURE TRAY) ×4 IMPLANT
PAD ARMBOARD 7.5X6 YLW CONV (MISCELLANEOUS) ×4 IMPLANT
PENCIL BUTTON HOLSTER BLD 10FT (ELECTRODE) ×4 IMPLANT
SET INTRODUCER 12FR PACEMAKER (SHEATH) IMPLANT
SET SHEATH INTRODUCER 10FR (MISCELLANEOUS) IMPLANT
SHEATH COOK PEEL AWAY SET 9F (SHEATH) IMPLANT
SPONGE LAP 18X18 X RAY DECT (DISPOSABLE) ×4 IMPLANT
SUT ETHILON 2 0 FS 18 (SUTURE) ×4 IMPLANT
SUT MNCRL AB 4-0 PS2 18 (SUTURE) ×4 IMPLANT
SUT PROLENE 2 0 SH 30 (SUTURE) ×4 IMPLANT
SUT VIC AB 3-0 SH 18 (SUTURE) ×4 IMPLANT
SUT VIC AB 3-0 SH 27 (SUTURE) ×4
SUT VIC AB 3-0 SH 27X BRD (SUTURE) ×2 IMPLANT
SYR 20ML ECCENTRIC (SYRINGE) ×8 IMPLANT
SYR 5ML LUER SLIP (SYRINGE) ×4 IMPLANT
SYR BULB 3OZ (MISCELLANEOUS) ×4 IMPLANT
SYR CONTROL 10ML LL (SYRINGE) ×6 IMPLANT
TOWEL OR 17X24 6PK STRL BLUE (TOWEL DISPOSABLE) ×4 IMPLANT
TOWEL OR 17X26 10 PK STRL BLUE (TOWEL DISPOSABLE) ×4 IMPLANT
TUBE CONNECTING 12'X1/4 (SUCTIONS) ×1
TUBE CONNECTING 12X1/4 (SUCTIONS) ×3 IMPLANT
YANKAUER SUCT BULB TIP NO VENT (SUCTIONS) ×4 IMPLANT

## 2017-04-30 NOTE — Anesthesia Procedure Notes (Signed)
Procedure Name: LMA Insertion Date/Time: 04/30/2017 3:03 PM Performed by: Candis Shine Pre-anesthesia Checklist: Patient identified, Emergency Drugs available, Suction available and Patient being monitored Patient Re-evaluated:Patient Re-evaluated prior to induction Oxygen Delivery Method: Circle System Utilized Preoxygenation: Pre-oxygenation with 100% oxygen Induction Type: IV induction Ventilation: Mask ventilation without difficulty LMA: LMA inserted LMA Size: 4.0 Number of attempts: 1 Placement Confirmation: positive ETCO2 Tube secured with: Tape Dental Injury: Teeth and Oropharynx as per pre-operative assessment

## 2017-04-30 NOTE — H&P (View-Only) (Signed)
HAGEN TIDD 04/09/2017 2:06 PM Location: [facility] Patient #: [id number] DOB: 1955-11-25 Married / Language: English / Race: Black or African American Female  History of Present Illness Marcello Moores A. Kabe Mckoy MD; 04/09/2017 2:38 PM) Patient words: Patient sent at the request of Dr. Margarette Canada for mammographic abnormality detected on screening mammogram. The patient went for any mammogram and a 1.3 cm mass was identified in the left breast in the upper outer quadrant. Core biopsy showed invasive ductal carcinoma. Receptors pending. Patient denies any history of breast pain, nipple discharge or breast mass. There is no family history of breast cancer. She has no other complaints except for some soreness in the right breast at the biopsy site.                    Study Result  CLINICAL DATA: The patient was called back from screening mammography due to a left breast asymmetry. EXAM: 2D DIGITAL DIAGNOSTIC LEFT MAMMOGRAM WITH CAD AND ADJUNCT TOMO ULTRASOUND LEFT BREAST COMPARISON: Previous exam(s). ACR Breast Density Category b: There are scattered areas of fibroglandular density. FINDINGS: There is an asymmetry in the superior left breast on the MLO view, not present on the CC view, which is new compared to more remote studies. No other interval changes or suspicious findings. Mammographic images were processed with CAD. On physical exam, no suspicious lumps are identified. Targeted ultrasound is performed, showing an irregular hypoechoic apparently solid mass in the left breast at 2:30, 10 cm from the nipple measuring 13 x 10 x 11 mm. No axillary adenopathy identified. IMPRESSION: Suspicious mass in the left breast. No other suspicious findings. RECOMMENDATION: Recommend ultrasound-guided biopsy of the suspicious left breast mass. I have discussed the findings and recommendations with the patient. Results were also provided in writing at the conclusion  of the visit. If applicable, a reminder letter will be sent to the patient regarding the next appointment. BI-RADS CATEGORY 4: Suspicious. Electronically Signed By: Dorise Bullion III M.D On: 04/03/2017 09:51        Diagnosis Breast, left, needle core biopsy, OU, 2:30 o'clock position - INVASIVE DUCTAL CARCINOMA, SEE COMMENT. Microscopic Comment The carcinoma appears grade 2. Prognostic markers will be ordered. Dr. Lyndon Code has reviewed the case. The case was called to [facility] on 04/08/2017. Vicente Males MD Pathologist, Electronic Signature (Case signed 04/08/2017) Specimen Gross and Clinical Information Specimen Comment TIF - 2:00 PM, extracted < 1 minute; suspicious 1.3cm mass in UO left breast Specimen(s) Obtained: Breast, left, needle core biopsy, OU, 2:30 o'clock position Gross Received in formalin (TIF 2pm, CIT less than one minute), labeled with the patient's name and "left breast 2:30" are four cores of tan-yellow soft tissue ranging from 1 to 2.1 cm . Entirely submitted in one cassette. (AK 04/05/2017) Report signed out from the following location(s) Technical Component was performed at [facility]. [address],[place] [address].TFTD:[id number],WCB:[id number]., Interpretation was performed at [facility], [place], [place] [address]. CLIA #: [id number], 1 of 1.  The patient is a 61 year old female.   Diagnostic Studies History Malachy Moan, [place]; 04/09/2017 2:06 PM) Colonoscopy within last year Mammogram within last year Pap Smear 1-5 years ago  Allergies Malachy Moan, RMA; 04/09/2017 2:08 PM) Sulfa Antibiotics sulfa drugs CIPROFLOXACIN Irregular heart rate. Invokana *ANTIDIABETICS* Jardiance *ANTIDIABETICS*  Medication History Malachy Moan, RMA; 04/09/2017 2:09 PM) Clotrimazole-Betamethasone (1-0.05% Cream, External) Active. Estradiol (0.1MG /GM Cream,  Vaginal) Active. Losartan Potassium (100MG  Tablet,  Oral) Active. Meloxicam (15MG  Tablet, Oral) Active. MetFORMIN HCl (1000MG  Tablet, Oral) Active. Trulicity (1.5MG /0.5ML Soln Pen-inj, Subcutaneous) Active. HumuLIN R U-500 KwikPen (500UNIT/ML Soln Pen-inj, Subcutaneous) Active. OneTouch Ultra Blue (In Vitro) Active. Triamterene-HCTZ (37.5-25MG  Tablet, Oral) Active. Medications Reconciled  Social History Malachy Moan, [place]; 04/09/2017 2:06 PM) Alcohol use Occasional alcohol use. Caffeine use Carbonated beverages, Coffee. No drug use Tobacco use Never smoker.  Family History Malachy Moan, [place]; 04/09/2017 2:06 PM) Arthritis Mother. Diabetes Mellitus Mother.  Pregnancy / Birth History Malachy Moan, [place]; 04/09/2017 2:06 PM) Age at menarche 62 years. Age of menopause <46 Gravida 1 Maternal age 80-25 Para 1  Other Problems Malachy Moan, [place]; 04/09/2017 2:06 PM) Diabetes Mellitus High blood pressure Lump In Breast     Review of Systems Malachy Moan RMA; 04/09/2017 2:06 PM) General Present- Fatigue and Night Sweats. Not Present- Appetite Loss, Chills, Fever, Weight Gain and Weight Loss. HEENT Present- Sinus Pain and Wears glasses/contact lenses. Not Present- Earache, Hearing Loss, Hoarseness, Nose Bleed, Oral Ulcers, Ringing in the Ears, Seasonal Allergies, Sore Throat, Visual Disturbances and Yellow Eyes. Breast Present- Breast Mass and Breast Pain. Not Present- Nipple Discharge and Skin Changes. Cardiovascular Present- Swelling of Extremities. Not Present- Chest Pain, Difficulty Breathing Lying Down, Leg Cramps, Palpitations, Rapid Heart Rate and Shortness of Breath. Gastrointestinal Present- Bloating. Not Present- Abdominal Pain, Bloody Stool, Change in Bowel Habits, Chronic diarrhea, Constipation, Difficulty Swallowing, Excessive gas, Gets full quickly at meals, Hemorrhoids, Indigestion, Nausea, Rectal Pain and Vomiting. Female Genitourinary  Present- Nocturia. Not Present- Frequency, Painful Urination, Pelvic Pain and Urgency. Musculoskeletal Present- Joint Stiffness. Not Present- Back Pain, Joint Pain, Muscle Pain, Muscle Weakness and Swelling of Extremities. Neurological Present- Numbness and Tingling. Not Present- Decreased Memory, Fainting, Headaches, Seizures, Tremor, Trouble walking and Weakness. Endocrine Present- Hot flashes. Not Present- Cold Intolerance, Excessive Hunger, Hair Changes, Heat Intolerance and New Diabetes.  Vitals Malachy Moan RMA; 04/09/2017 2:10 PM) 04/09/2017 2:09 PM Weight: 241.6 lb Height: 65in Body Surface Area: 2.14 m Body Mass Index: 40.2 kg/m  Temp.: 98.24F  Pulse: 77 (Regular)  BP: 130/90 (Sitting, Left Arm, Standard)      Physical Exam (Alexianna Nachreiner A. Candance Bohlman MD; 04/09/2017 2:39 PM)  General Mental Status-Alert. General Appearance-Consistent with stated age. Hydration-Well hydrated. Voice-Normal.  Head and Neck Head-normocephalic, atraumatic with no lesions or palpable masses. Trachea-midline. Thyroid Gland Characteristics - normal size and consistency.  Breast Breast - Left-Symmetric, Non Tender, No Biopsy scars, no Dimpling, No Inflammation, No Lumpectomy scars, No Mastectomy scars, No Peau d' Orange. Breast - Right-Symmetric, Non Tender, No Biopsy scars, no Dimpling, No Inflammation, No Lumpectomy scars, No Mastectomy scars, No Peau d' Orange. Breast Lump-No Palpable Breast Mass. Note: mild swelling left breast at biopsy site  Cardiovascular Cardiovascular examination reveals -normal heart sounds, regular rate and rhythm with no murmurs and normal pedal pulses bilaterally.  Abdomen Inspection Inspection of the abdomen reveals - No Hernias. Skin - Scar - no surgical scars. Palpation/Percussion Palpation and Percussion of the abdomen reveal - Soft, Non Tender, No Rebound tenderness, No Rigidity (guarding) and No  hepatosplenomegaly. Auscultation Auscultation of the abdomen reveals - Bowel sounds normal.  Neurologic Neurologic evaluation reveals -alert and oriented x 3 with no impairment of recent or remote memory. Mental Status-Normal.  Lymphatic Head & Neck  General Head & Neck Lymphatics: Bilateral - Description - Normal. Axillary  General Axillary Region: Bilateral - Description - Normal. Tenderness - Non Tender.    Assessment & Plan (Korinna Tat A. Inna Tisdell MD; 04/09/2017 2:40 PM)  BREAST CANCER, LEFT (C50.912) Impression: Discussed breast conservation versus mastectomy and reconstruction. Pros and cons of each as well as additional treatments of each needed. Refer to medical and radiation oncology. The patient is opted for left breast partial mastectomy with sentinel lymph node mapping. Risk of lumpectomy include bleeding, infection, seroma, more surgery, use of seed/wire, wound care, cosmetic deformity and the need for other treatments, death , blood clots, death. Pt agrees to proceed. Risk of sentinel lymph node mapping include bleeding, infection, lymphedema, shoulder pain. stiffness, dye allergy. cosmetic deformity , blood clots, death, need for more surgery. Pt agres to proceed.  Current Plans You are being scheduled for surgery- Our schedulers will call you.  You should hear from our office's scheduling department within 5 working days about the location, date, and time of surgery. We try to make accommodations for patient's preferences in scheduling surgery, but sometimes the OR schedule or the surgeon's schedule prevents [place] from making those accommodations.  If you have not heard from our office [phone] in 5 working days, call the office and ask for your surgeon's nurse.  If you have other questions about your diagnosis, plan, or surgery, call the office and ask for your surgeon's nurse.  We discussed the staging and pathophysiology of breast cancer. We discussed all of the  different options for treatment for breast cancer including surgery, chemotherapy, radiation therapy, Herceptin, and antiestrogen therapy. We discussed a sentinel lymph node biopsy as she does not appear to having lymph node involvement right now. We discussed the performance of that with injection of radioactive tracer and blue dye. We discussed that she would have an incision underneath her axillary hairline. We discussed that there is a bout a 10-20% chance of having a positive node with a sentinel lymph node biopsy and we will await the permanent pathology to make any other first further decisions in terms of her treatment. One of these options might be to return to the operating room to perform an axillary lymph node dissection. We discussed about a 1-2% risk lifetime of chronic shoulder pain as well as lymphedema associated with a sentinel lymph node biopsy. We discussed the options for treatment of the breast cancer which included lumpectomy versus a mastectomy. We discussed the performance of the lumpectomy with a wire placement. We discussed a 10-20% chance of a positive margin requiring reexcision in the operating room. We also discussed that she may need radiation therapy or antiestrogen therapy or both if she undergoes lumpectomy. We discussed the mastectomy and the postoperative care for that as well. We discussed that there is no difference in her survival whether she undergoes lumpectomy with radiation therapy or antiestrogen therapy versus a mastectomy. There is a slight difference in the local recurrence rate being 3-5% with lumpectomy and about 1% with a mastectomy. We discussed the risks of operation including bleeding, infection, possible reoperation. She understands her further therapy will be based on what her stages at the time of her operation.  Pt Education - [facility]b breast cancer surgery: discussed with patient and provided information. Pt Education - ABC (After Breast Cancer) Class  Info: discussed with patient and provided information.

## 2017-04-30 NOTE — Anesthesia Procedure Notes (Signed)
Anesthesia Regional Block: Pectoralis block   Pre-Anesthetic Checklist: ,, timeout performed, Correct Patient, Correct Site, Correct Laterality, Correct Procedure, Correct Position, site marked, Risks and benefits discussed,  Surgical consent,  Pre-op evaluation,  At surgeon's request and post-op pain management  Laterality: Left  Prep: chloraprep       Needles:  Injection technique: Single-shot  Needle Type: Echogenic Needle     Needle Length: 9cm  Needle Gauge: 21     Additional Needles:   Procedures:,,,, ultrasound used (permanent image in chart),,,,  Narrative:  Start time: 04/30/2017 1:53 PM End time: 04/30/2017 2:00 PM Injection made incrementally with aspirations every 5 mL.  Performed by: Personally  Anesthesiologist: Suzette Battiest

## 2017-04-30 NOTE — Transfer of Care (Signed)
Immediate Anesthesia Transfer of Care Note  Patient: Audrey Peters  Procedure(s) Performed: Procedure(s): LEFT BREAST LUMPECTOMY WITH RADIOACTIVE SEED AND LEFT SENTINEL LYMPH NODE BIOPSY ERAS PATHWAY (Left) INSERTION PORT-A-CATH (Right)  Patient Location: PACU  Anesthesia Type:GA combined with regional for post-op pain  Level of Consciousness: awake, alert  and oriented  Airway & Oxygen Therapy: Patient Spontanous Breathing and Patient connected to nasal cannula oxygen  Post-op Assessment: Report given to RN and Post -op Vital signs reviewed and stable  Post vital signs: Reviewed and stable  Last Vitals:  Vitals:   04/30/17 1410 04/30/17 1415  BP: (!) 147/77 (!) 153/75  Pulse: 80 84  Resp: (!) 21 (!) 22  Temp:    SpO2: 98% 96%    Last Pain:  Vitals:   04/30/17 1232  TempSrc: Oral         Complications: No apparent anesthesia complications

## 2017-04-30 NOTE — Interval H&P Note (Signed)
History and Physical Interval Note:  04/30/2017 2:46 PM  Audrey Peters  has presented today for surgery, with the diagnosis of LEFT BREAST CANCER  The various methods of treatment have been discussed with the patient and family. After consideration of risks, benefits and other options for treatment, the patient has consented to  Procedure(s): LEFT BREAST LUMPECTOMY WITH RADIOACTIVE SEED AND LEFT SENTINEL LYMPH NODE BIOPSY ERAS PATHWAY (Left) INSERTION PORT-A-CATH (N/A) as a surgical intervention .  The patient's history has been reviewed, patient examined, no change in status, stable for surgery.  I have reviewed the patient's chart and labs.  Questions were answered to the patient's satisfaction.  Risk of port bleeding infection death DVT PE injury to heart lung mediastinum migration occlusion and need for revision   Houa Ackert A.

## 2017-04-30 NOTE — Discharge Instructions (Signed)
[place] Office Phone Number [phone]  BREAST BIOPSY/ PARTIAL MASTECTOMY: POST OP INSTRUCTIONS  Always review your discharge instruction sheet given to you by the facility where your surgery was performed.  IF YOU HAVE DISABILITY OR FAMILY LEAVE FORMS, YOU MUST BRING THEM TO THE OFFICE FOR PROCESSING.  DO NOT GIVE THEM TO YOUR DOCTOR.  1. A prescription for pain medication may be given to you upon discharge.  Take your pain medication as prescribed, if needed.  If narcotic pain medicine is not needed, then you may take acetaminophen (Tylenol) or ibuprofen (Advil) as needed. 2. Take your usually prescribed medications unless otherwise directed 3. If you need a refill on your pain medication, please contact your pharmacy.  They will contact our office to request authorization.  Prescriptions will not be filled after 5pm or on week-ends. 4. You should eat very light the first 24 hours after surgery, such as soup, crackers, pudding, etc.  Resume your normal diet the day after surgery. 5. Most patients will experience some swelling and bruising in the breast.  Ice packs and a good support bra will help.  Swelling and bruising can take several days to resolve.  6. It is common to experience some constipation if taking pain medication after surgery.  Increasing fluid intake and taking a stool softener will usually help or prevent this problem from occurring.  A mild laxative (Milk of Magnesia or Miralax) should be taken according to package directions if there are no bowel movements after 48 hours. 7. Unless discharge instructions indicate otherwise, you may remove your bandages 24-48 hours after surgery, and you may shower at that time.  You may have steri-strips (small skin tapes) in place directly over the incision.  These strips should be left on the skin for 7-10 days.  If your surgeon used skin glue on the incision, you may shower in 24 hours.  The glue will flake off over the  next 2-3 weeks.  Any sutures or staples will be removed at the office during your follow-up visit. 8. ACTIVITIES:  You may resume regular daily activities (gradually increasing) beginning the next day.  Wearing a good support bra or sports bra minimizes pain and swelling.  You may have sexual intercourse when it is comfortable. a. You may drive when you no longer are taking prescription pain medication, you can comfortably wear a seatbelt, and you can safely maneuver your car and apply brakes. b. RETURN TO WORK:  ______________________________________________________________________________________ 9. You should see your doctor in the office for a follow-up appointment approximately two weeks after your surgery.  Your doctors nurse will typically make your follow-up appointment when she calls you with your pathology report.  Expect your pathology report 2-3 business days after your surgery.  You may call to check if you do not hear from [place] after three days. 10. OTHER INSTRUCTIONS: _______________________________________________________________________________________________ _____________________________________________________________________________________________________________________________________ _____________________________________________________________________________________________________________________________________ _____________________________________________________________________________________________________________________________________  WHEN TO CALL YOUR DOCTOR: 1. Fever over 101.0 2. Nausea and/or vomiting. 3. Extreme swelling or bruising. 4. Continued bleeding from incision. 5. Increased pain, redness, or drainage from the incision.  The clinic staff is available to answer your questions during regular business hours.  Please dont hesitate to call and ask to speak to one of the nurses for clinical concerns.  If you have a medical emergency, go to the nearest  emergency room or call 911.  A surgeon from [facility] is always on call at the hospital.  For further questions, please visit [email or web address]  Surgical [facility] Surgical drains are used to remove extra fluid that normally builds up in a surgical wound after surgery. A surgical drain helps to heal a surgical wound. Different kinds of surgical drains include:  Active drains. These drains use suction to pull drainage away from the surgical wound. Drainage flows through a tube to a container outside of the body. It is important to keep the bulb or the drainage container flat (compressed) at all times, except while you empty it. Flattening the bulb or container creates suction. The two most common types of active drains are bulb drains and Hemovac drains.  Passive drains. These drains allow fluid to drain naturally, by gravity. Drainage flows through a tube to a bandage (dressing) or a container outside of the body. Passive drains do not need to be emptied. The most common type of passive drain is the Penrose drain.  A drain is placed during surgery. Immediately after surgery, drainage is usually bright red and a little thicker than water. The drainage may gradually turn yellow or pink and become thinner. It is likely that your health care provider will remove the drain when the drainage stops or when the amount decreases to 1-2 Tbsp (15-30 mL) during a 24-hour period. How to care for your surgical drain  Keep the skin around the drain dry and covered with a dressing at all times.  Check your drain area every day for signs of infection. Check for: ? More redness, swelling, or pain. ? Pus or a bad smell. ? Cloudy drainage. Follow instructions from your health care provider about how to take care of your drain and how to change your dressing. Change your dressing at least one time every day. Change it more often if needed to keep the dressing dry. Make sure  you: 1. Gather your supplies, including: ? Tape. ? Germ-free cleaning solution (sterile saline). ? Split gauze drain sponge: 4 x 4 inches (10 x 10 cm). ? Gauze square: 4 x 4 inches (10 x 10 cm). 2. Wash your hands with soap and water before you change your dressing. If soap and water are not available, use hand sanitizer. 3. Remove the old dressing. Avoid using scissors to do that. 4. Use sterile saline to clean your skin around the drain. 5. Place the tube through the slit in a drain sponge. Place the drain sponge so that it covers your wound. 6. Place the gauze square or another drain sponge on top of the drain sponge that is on the wound. Make sure the tube is between those layers. 7. Tape the dressing to your skin. 8. If you have an active bulb or Hemovac drain, tape the drainage tube to your skin 1-2 inches (2.5-5 cm) below the place where the tube enters your body. Taping keeps the tube from pulling on any stitches (sutures) that you have. 9. Wash your hands with soap and water. 10. Write down the color of your drainage and how often you change your dressing.  How to empty your active bulb or Hemovac drain 1. Make sure that you have a measuring cup that you can empty your drainage into. 2. Wash your hands with soap and water. If soap and water are not available, use hand sanitizer. 3. Gently move your fingers down the tube while squeezing very lightly. This is called stripping the tube. This clears any drainage, clots, or tissue from the tube. ? Do not pull on the tube. ? You may need to strip  the tube several times every day to keep the tube clear. 4. Open the bulb cap or the drain plug. Do not touch the inside of the cap or the bottom of the plug. 5. Empty all of the drainage into the measuring cup. 6. Compress the bulb or the container and replace the cap or the plug. To compress the bulb or the container, squeeze it firmly in the middle while you close the cap or plug the  container. 7. Write down the amount of drainage that you have in each 24-hour period. If you have less than 2 Tbsp (30 mL) of drainage during 24 hours, contact your health care provider. 8. Flush the drainage down the toilet. 9. Wash your hands with soap and water. Contact a health care provider if:  You have more redness, swelling, or pain around your drain area.  The amount of drainage that you have is increasing instead of decreasing.  You have pus or a bad smell coming from your drain area.  You have a fever.  You have drainage that is cloudy.  There is a sudden stop or a sudden decrease in the amount of drainage that you have.  Your tube falls out.  Your active draindoes not stay compressedafter you empty it. This information is not intended to replace advice given to you by your health care provider. Make sure you discuss any questions you have with your health care provider. Document Released: 07/27/2000 Document Revised: 01/05/2016 Document Reviewed: 02/16/2015 Elsevier Interactive Patient Education  2018 [place]: POST OP INSTRUCTIONS  Always review your discharge instruction sheet given to you by the facility where your surgery was performed.   1. A prescription for pain medication may be given to you upon discharge. Take your pain medication as prescribed, if needed. If narcotic pain medicine is not needed, then you make take acetaminophen (Tylenol) or ibuprofen (Advil) as needed.  2. Take your usually prescribed medications unless otherwise directed. 3. If you need a refill on your pain medication, please contact our office. All narcotic pain medicine now requires a paper prescription.  Phoned in and fax refills are no longer allowed by law.  Prescriptions will not be filled after 5 pm or on weekends.  4. You should follow a light diet for the remainder of the day after your procedure. 5. Most patients will experience some mild swelling  and/or bruising in the area of the incision. It may take several days to resolve. 6. It is common to experience some constipation if taking pain medication after surgery. Increasing fluid intake and taking a stool softener (such as Colace) will usually help or prevent this problem from occurring. A mild laxative (Milk of Magnesia or Miralax) should be taken according to package directions if there are no bowel movements after 48 hours.  7. Unless discharge instructions indicate otherwise, you may remove your bandages 48 hours after surgery, and you may shower at that time. You may have steri-strips (small white skin tapes) in place directly over the incision.  These strips should be left on the skin for 7-10 days.  If your surgeon used Dermabond (skin glue) on the incision, you may shower in 24 hours.  The glue will flake off over the next 2-3 weeks.  8. If your port is left accessed at the end of surgery (needle left in port), the dressing cannot get wet and should only by changed by a healthcare professional. When the  port is no longer accessed (when the needle has been removed), follow step 7.   9. ACTIVITIES:  Limit activity involving your arms for the next 72 hours. Do no strenuous exercise or activity for 1 week. You may drive when you are no longer taking prescription pain medication, you can comfortably wear a seatbelt, and you can maneuver your car. 10.You may need to see your doctor in the office for a follow-up appointment.  Please       check with your doctor.  11.When you receive a new Port-a-Cath, you will get a product guide and        ID card.  Please keep them in case you need them.  WHEN TO CALL YOUR DOCTOR [phone]): 1. Fever over 101.0 2. Chills 3. Continued bleeding from incision 4. Increased redness and tenderness at the site 5. Shortness of breath, difficulty breathing   The clinic staff is available to answer your questions during regular business hours. Please dont  hesitate to call and ask to speak to one of the nurses or medical assistants for clinical concerns. If you have a medical emergency, go to the nearest emergency room or call 911.  A surgeon from [facility] is always on call at the hospital.     For further information, please visit [email or web address]

## 2017-04-30 NOTE — Anesthesia Postprocedure Evaluation (Signed)
Anesthesia Post Note  Patient: Audrey Peters  Procedure(s) Performed: Procedure(s) (LRB): LEFT BREAST LUMPECTOMY WITH RADIOACTIVE SEED AND LEFT SENTINEL LYMPH NODE BIOPSY ERAS PATHWAY (Left) INSERTION PORT-A-CATH (Right)     Patient location during evaluation: PACU Anesthesia Type: General Level of consciousness: awake and alert, patient cooperative and oriented Pain management: pain level controlled (pain improving) Vital Signs Assessment: post-procedure vital signs reviewed and stable Respiratory status: spontaneous breathing, nonlabored ventilation and respiratory function stable Cardiovascular status: blood pressure returned to baseline and stable Postop Assessment: no apparent nausea or vomiting Anesthetic complications: no    Last Vitals:  Vitals:   04/30/17 1715 04/30/17 1716  BP:  105/63  Pulse: 66 66  Resp: (!) 21 20  Temp:    SpO2: 97% 98%    Last Pain:  Vitals:   04/30/17 1717  TempSrc:   PainSc: Asleep                 Oltmann,E. Stanislawa Gaffin

## 2017-04-30 NOTE — Op Note (Signed)
Preoperative diagnosis: Stage I left breast cancer upper-outer quadrant   Postoperative diagnosis: Same   Procedure: Left breast seed localized lumpectomy with left axillary deep  sentinel lymph node mapping with methylene blue dye  AND PORT PLACEMENT with  C arm and U/S guidance.   Surgeon: Erroll Luna M.D.   Anesthesia: LMA with pectoral block anesthesia   EBL: 20 cc   Specimen: Left breast mass with clipped and seed to pathology and two  axillary sentinel node hot and blue   Drains: 54 F round    Indications for procedure: Patient presents for treatment of her left breast cancer. She has opted for breast conservation after lengthy discussion of treatment options to include breast conservation surgery and mastectomy and reconstruction. Risks, benefits and alternatives discussed with the patient.The procedure has been discussed with the patient. Alternatives to surgery have been discussed with the patient. Risks of surgery include bleeding, Infection, Seroma formation, death, and the need for further surgery. The patient understands and wishes to proceed.Sentinel lymph node mapping and dissection has been discussed with the patient. Risk of bleeding, Infection, Seroma formation, Additional procedures,, Shoulder weakness , Shoulder stiffness, Nerve and blood vessel injury and reaction to the mapping dyes have been discussed. Alternatives to surgery have been discussed with the patient. The patient agrees to proceed.      Description of procedure: Patient underwent placement of left breast need a radiology earlier in the week. She presents to the holding area and questions are answered. Neoprobe was used to verify clip placement in the left breast. Patient underwent technetium sulfur colloid injection per protocol. Questions answered. Patient taken back to operating room and placed supine on the operating room table. Patient received 2 g of Ancef. After induction of LMA anesthesia both   breast was prepped and draped in a sterile fashion and 4 cc of methylene blue dye were injected in a subareolar position. Of note, patient had pectoral block by anesthesia prior to this.  The right internal jugular vein  was entered under U/S guidance  and the guidewire threaded into the superior vena cava right atrial area under fluoroscopic guidance. An incision was then made on the anterior chest wall and a subcutaneous pocket fashioned for the port reservoir.  The port tubing was then brought through a subcutaneous tunnel from the port site to the guidewire site.  The port and catheter were attached, locked  and flushed. The catheter was measured and cut to appropriate length.The dilator and peel-away sheath were then advanced over the guidewire while monitoring this with fluoroscopy. The guidewire and dilator were removed and the tubing threaded to approximately 22 cm. The peel-away sheath was then removed. The catheter aspirated and flushed easily. Using fluoroscopy the tip was in the superior vena cava right atrial junction area. It aspirated and flushed easily. That aspirated and flushed easily.  The reservoir was secured to the fascia with 1 sutures of 2-0 Prolene. A final check with fluoroscopy was done to make sure we had no kinks and good positioning of the tip of the catheter. Everything appeared to be okay. The catheter was aspirated, flushed with dilute heparin and then concentrated aqueous heparin.  The incision was then closed with interrupted 3-0 Vicryl, and 4-0 Monocryl subcuticular with Dermabond on the skin.  There were no operative complications. Estimated blood loss was minimal. All counts were correct. The patient tolerated the procedure well.  The breast was addressed.      Neoprobe was used to identify  the radioactive seen in the left upper-outer quadrant. Curvilinear incision made and dissection was carried around to excise all tissue around both the clip and seen.  Radiograph showed the mass with gross negative margins. Both she and clip were in the specimen. Additional medial margin taken.   Specimen sent to pathology.   Neoprobe was switched to the technetium sulfur colloid setting.  2 Hot spots  identified in the  the left axilla. Lumpectomy incision used and deep axillary lymph nodes identified as hot and blue.  Hot and blue lymph nodes identified and excised. Background counts approached 0. Wound  was irrigated and hemostatic.  A 19 round drain was placed.  The incision  Was  closed with 3-0 Vicryl and 4-0 Monocryl. Drain stitch placed.  Dermabond applied. All final counts sponge, needle instruments found to be correct at this point. Patient awoke, taken to recovery in satisfactory condition.

## 2017-04-30 NOTE — Anesthesia Preprocedure Evaluation (Addendum)
Anesthesia Evaluation  Patient identified by MRN, date of birth, ID band Patient awake    Reviewed: Allergy & Precautions, NPO status , Patient's Chart, lab work & pertinent test results  Airway Mallampati: III  TM Distance: >3 FB Neck ROM: Full    Dental  (+) Dental Advisory Given   Pulmonary sleep apnea ,    breath sounds clear to auscultation       Cardiovascular hypertension, Pt. on medications  Rhythm:Regular Rate:Normal     Neuro/Psych negative neurological ROS     GI/Hepatic negative GI ROS, Neg liver ROS,   Endo/Other  diabetes, Type 2, Insulin DependentMorbid obesity  Renal/GU negative Renal ROS     Musculoskeletal  (+) Arthritis ,   Abdominal   Peds  Hematology negative hematology ROS (+)   Anesthesia Other Findings   Reproductive/Obstetrics                            Lab Results  Component Value Date   WBC 8.2 04/25/2017   HGB 12.7 04/25/2017   HCT 40.4 04/25/2017   MCV 88.6 04/25/2017   PLT 207 04/25/2017   Lab Results  Component Value Date   CREATININE 0.93 04/25/2017   BUN 19 04/25/2017   NA 137 04/25/2017   K 3.5 04/25/2017   CL 105 04/25/2017   CO2 26 04/25/2017    Anesthesia Physical Anesthesia Plan  ASA: II  Anesthesia Plan: General   Post-op Pain Management:  Regional for Post-op pain   Induction: Intravenous  PONV Risk Score and Plan: 3 and Ondansetron, Dexamethasone, Midazolam and Treatment may vary due to age or medical condition  Airway Management Planned: LMA  Additional Equipment:   Intra-op Plan:   Post-operative Plan: Extubation in OR  Informed Consent: I have reviewed the patients History and Physical, chart, labs and discussed the procedure including the risks, benefits and alternatives for the proposed anesthesia with the patient or authorized representative who has indicated his/her understanding and acceptance.   Dental advisory  given  Plan Discussed with: CRNA  Anesthesia Plan Comments:        Anesthesia Quick Evaluation

## 2017-05-01 ENCOUNTER — Encounter: Payer: Self-pay | Admitting: Radiation Oncology

## 2017-05-01 ENCOUNTER — Encounter (HOSPITAL_COMMUNITY): Payer: Self-pay | Admitting: Surgery

## 2017-05-06 NOTE — Progress Notes (Signed)
Location of Breast Cancer: Left Breast  Histology per Pathology Report:  04/05/17 Diagnosis Breast, left, needle core biopsy, OU, 2:30 o'clock position - INVASIVE DUCTAL CARCINOMA, SEE COMMENT.  Receptor Status: ER(NEG), PR (NEG), Her2-neu (NEG), Ki-(40%)  04/30/17 Diagnosis 1. 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 Lymph node, sentinel, biopsy, Left Axillary ONE BENIGN LYMPH NODE (0/1) 3. Breast, excision, Left additional Medial Margin BENIGN FIBROMUSCULAR TISSUE  Did patient present with symptoms or was this found on screening mammography?: It was found on a screening mammogram.   Past/Anticipated interventions by surgeon, if any: 04/30/17 Procedure: Left breast seed localized lumpectomy with left axillary deep  sentinel lymph node mapping with methylene blue dye  AND PORT PLACEMENT with  C arm and U/S guidance.  Surgeon: Erroll Luna M.D.   She will see Dr. Brantley Stage on 10/5 for evaluation and JP drain removal.   Past/Anticipated interventions by medical oncology, if any: 04/22/17 Dr. Lindi Adie Recommendation: 1. Breast conserving surgery with sentinel lymph node biopsy 2. adjuvant chemotherapy with dose dense Adriamycin and Cytoxan 4 followed by Taxol weekly 12 ( cannot use steroids because patient is diabetic) 3. Followed by radiation  She has genetic counseling, lab, and chemotherapy education today. She will start chemotherapy on 05/24/17.  Lymphedema issues, if any:  She denies. She still has a drain in place. She has some swelling to her incision site.   Pain issues, if any:  She denies  SAFETY ISSUES:  Prior radiation? No  Pacemaker/ICD? No  Possible current pregnancy? No  Is the patient on methotrexate? No  Current Complaints / other details:    BP 129/84   Pulse 77   Temp 98.3 F (36.8 C)   Ht 5'  5" (1.651 m)   Wt 238 lb 12.8 oz (108.3 kg)   SpO2 98% Comment: room air  BMI 39.74 kg/m    Wt Readings from Last 3 Encounters:  05/14/17 238 lb 12.8 oz (108.3 kg)  04/30/17 238 lb (108 kg)  04/25/17 238 lb 4.8 oz (108.1 kg)      Mariamawit Depaoli, Stephani Police, RN 05/06/2017,10:35 AM

## 2017-05-13 NOTE — Progress Notes (Signed)
Radiation Oncology         [phone] ________________________________  Initial Outpatient Consultation  Name: Audrey Peters MRN: [id number]  Date: 05/14/2017  DOB: 1955/12/15  IN:OMVEHM, Jori Moll, MD  Erroll Luna, MD   REFERRING PHYSICIAN: Erroll Luna, MD  DIAGNOSIS:    ICD-10-CM   1. 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 female who presented with possible asymmetry in the left breast found on 03/28/2017 bilateral screening mammogram. Left breast mammogram on 04/03/2017 showed the suspicious mass measuring 1.3 cm at the 2:30 position, which prompted ultrasound-guided biopsy. Biopsy performed on 04/05/2017 revealed invasive ductal carcinoma with characteristics as described above in the diagnosis.  The patient was referred to Dr. Lindi Adie to discuss treatment options to include breast conserving surgery and adjuvant chemoradiotherapy. She underwent left breast lumpectomy with sentinel lymph node biopsy with Dr. Brantley Stage on 04/30/2017. Final pathology revealed grade 3 invasive ductal carcinoma, spanning 1.7 cm, with DCIS and lymphovascular invasion. Resection margins were negative by at least 8 mm to invasive and in situ disease. One left axillary lymph node was removed and was negative. Her disease is triple negative. She is scheduled to start chemotherapy on 05/24/2017 with dose dense Adriamycin and Cytoxan x4 followed by Taxol weekly x12. She reports that she has genetic counseling, labs, and chemotherapy education today.  The patient presents today to discuss adjuvant radiotherapy. She is accompanied by her husband. She  reports that she will see Dr. Brantley Stage on 05/17/2017 for evaluation and JP drain removal. On review of systems, the patient is positive for swelling to her incision site. She reports one single headache that she believes was related to a sinus infection. She states that she has had constipation that is relieved by Miralax. She also reports being anxious about her diagnosis and treatment but says she has a great support system. Her pertinent medical history includes ovarian cysts, type 2 diabetes, and high blood pressure. She reports taking medication for her high BP. 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  PREVIOUS RADIATION THERAPY: No  PAST MEDICAL HISTORY:  has a past medical history of Anemia (yrs ago); Arthritis; Cancer ([facility]); Carpal tunnel syndrome of right wrist; Diabetes mellitus without complication ([facility]); Headache; Hypertension; Sleep apnea; and Vaginal delivery (1983).    PAST SURGICAL HISTORY: Past Surgical History:  Procedure Laterality Date  . BREAST LUMPECTOMY WITH RADIOACTIVE SEED AND SENTINEL LYMPH NODE BIOPSY Left 04/30/2017   Procedure: LEFT BREAST LUMPECTOMY WITH RADIOACTIVE SEED AND LEFT SENTINEL LYMPH NODE BIOPSY ERAS PATHWAY;  Surgeon: Erroll Luna, MD;  Location: [facility];  Service: General;  Laterality: Left;  . COLONOSCOPY WITH PROPOFOL N/A 05/28/2016   Procedure: COLONOSCOPY WITH PROPOFOL;  Surgeon: Garlan Fair, MD;  Location[facility];  Service: Endoscopy;  Laterality: N/A;  . DILATION AND CURETTAGE OF UTERUS    . HYSTEROSCOPY W/D&C N/A 07/21/2015   Procedure: DILATATION AND CURETTAGE /HYSTEROSCOPY with myosure;  Surgeon: Janyth Pupa, DO;  Location: [facility];  Service: Gynecology;  Laterality: N/A;  . PORTACATH PLACEMENT Right 04/30/2017   Procedure: INSERTION PORT-A-CATH;  Surgeon: Erroll Luna,  MD;  Location: [facility];  Service: General;  Laterality: Right;    FAMILY HISTORY: family history includes Diabetes  in her mother; Hypertension in her mother.  SOCIAL HISTORY:  reports that she has never smoked. She has never used smokeless tobacco. She reports that she drinks alcohol. She reports that she does not use drugs.  ALLERGIES: Invokana [canagliflozin]; Sulfa antibiotics; and Ciprofloxacin  MEDICATIONS:  Current Outpatient Prescriptions  Medication Sig Dispense Refill  . acetaminophen (TYLENOL 8 HOUR ARTHRITIS PAIN) 650 MG CR tablet Take 650 mg by mouth every 8 (eight) hours as needed for pain.    . clotrimazole (LOTRIMIN) 1 % cream Apply 1 application topically daily as needed (for irritated/itchy skin.).     Marland Kitchen Dulaglutide (TRULICITY) 1.5 OX/7.3ZH SOPN Inject 1.5 mg into the skin every Tuesday.    Marland Kitchen ibuprofen (ADVIL,MOTRIN) 800 MG tablet Take 1 tablet (800 mg total) by mouth every 8 (eight) hours as needed. 30 tablet 0  . insulin regular human CONCENTRATED (HUMULIN R U-500 KWIKPEN) 500 UNIT/ML kwikpen Inject 50-120 Units into the skin 3 (three) times daily with meals. 120 units in the morning, 50 units at lunch and 50 units at supper.    . losartan (COZAAR) 100 MG tablet Take 100 mg by mouth daily.    . meloxicam (MOBIC) 15 MG tablet Take 15 mg by mouth daily.    . metFORMIN (GLUCOPHAGE) 1000 MG tablet Take 1,000 mg by mouth daily.    Marland Kitchen triamterene-hydrochlorothiazide (MAXZIDE-25) 37.5-25 MG per tablet Take 1 tablet by mouth daily.     Marland Kitchen estradiol (ESTRACE) 0.1 MG/GM vaginal cream Place 1 Applicatorful vaginally 2 (two) times a week.    Marland Kitchen oxyCODONE (OXY IR/ROXICODONE) 5 MG immediate release tablet Take 1-2 tablets (5-10 mg total) by mouth every 6 (six) hours as needed for severe pain. (Patient not taking: Reported on 05/14/2017) 20 tablet 0   No current facility-administered medications for this encounter.     REVIEW OF SYSTEMS: A 10+ POINT REVIEW OF SYSTEMS WAS OBTAINED including neurology, dermatology, psychiatry, cardiac, respiratory, lymph, extremities, GI, GU, Musculoskeletal,  constitutional, breasts,   HEENT.  All pertinent positives are noted in the HPI.  All others are negative.   PHYSICAL EXAM:  height is _0  (1.651 m) and weight is 238 lb 12.8 oz (108.3 kg). Her temperature is 98.3 F (36.8 C). Her blood pressure is 129/84 and her pulse is 77. Her oxygen saturation is 98%.   General: Alert and oriented, in no acute distress. HEENT: Head is normocephalic. Oropharynx is clear. Neck: Neck is supple, no palpable cervical or supraclavicular lymphadenopathy. Heart: Regular in rate and rhythm with no murmurs, rubs, or gallops. Chest: Decreased breath sounds bilalterally but no rhonchi, wheezes, or rales. Abdomen: Soft, nontender, nondistended, with no rigidity or guarding. Extremities: She has a little bit of edema in her ankles but not in her wrists. Lymphatics: see Neck Exam Skin: No concerning lesions. Musculoskeletal: Symmetric strength and muscle tone throughout. Neurologic: Speech is fluent. Coordination is intact. Psychiatric: Judgment and insight are intact. Affect is appropriate. Breasts: She is healing well from right upper chest port-a-cath placement. In the left breast, she has a JP drain continuing to drain a little bit of fluid from the UOQ. She has a healing UOQ scar. No other palpable masses appreciated in the right breast or axilla.   ECOG = 0  0 - Asymptomatic (Fully active, able to carry on all predisease activities without restriction)  1 - Symptomatic but completely  ambulatory (Restricted in physically strenuous activity but ambulatory and able to carry out work of a light or sedentary nature. For example, light housework, office work)  2 - Symptomatic, <50% in bed during the day (Ambulatory and capable of all self care but unable to carry out any work activities. Up and about more than 50% of waking hours)  3 - Symptomatic, >50% in bed, but not bedbound (Capable of only limited self-care, confined to bed or chair 50% or more of waking  hours)  4 - Bedbound (Completely disabled. Cannot carry on any self-care. Totally confined to bed or chair)  5 - Death   Eustace Pen MM, Creech RH, Tormey DC, et al. [phone]). "Toxicity and response criteria of the [facility] Group". [place] Oncol. 5 (6): 649-55   LABORATORY DATA:  Lab Results  Component Value Date   WBC 8.2 04/25/2017   HGB 12.7 04/25/2017   HCT 40.4 04/25/2017   MCV 88.6 04/25/2017   PLT 207 04/25/2017   CMP     Component Value Date/Time   NA 137 04/25/2017 0946   K 3.5 04/25/2017 0946   CL 105 04/25/2017 0946   CO2 26 04/25/2017 0946   GLUCOSE 199 (H) 04/25/2017 0946   BUN 19 04/25/2017 0946   CREATININE 0.93 04/25/2017 0946   CALCIUM 8.9 04/25/2017 0946   PROT 7.4 04/25/2017 0946   ALBUMIN 3.7 04/25/2017 0946   AST 37 04/25/2017 0946   ALT 38 04/25/2017 0946   ALKPHOS 67 04/25/2017 0946   BILITOT 0.5 04/25/2017 0946   GFRNONAA >60 04/25/2017 0946   GFRAA >60 04/25/2017 0946         RADIOGRAPHY: Mm Breast Surgical Specimen  Result Date: 04/30/2017 CLINICAL DATA:  Left lumpectomy for a recently biopsied invasive ductal carcinoma in the 2:30 o'clock position of the breast. EXAM: SPECIMEN RADIOGRAPH OF THE LEFT BREAST COMPARISON:  Previous exam(s). FINDINGS: Status post excision of the left breast. The radioactive seed and biopsy marker clip are present, completely intact, and were marked for pathology. IMPRESSION: Specimen radiograph of the left breast. Electronically Signed   By: Claudie Revering M.D.   On: 04/30/2017 16:12   Dg Chest Port 1 View  Result Date: 04/30/2017 CLINICAL DATA:  Porta catheter placement. EXAM: PORTABLE CHEST 1 VIEW COMPARISON:  None. FINDINGS: Right IJ porta catheter with tip near the upper cavoatrial junction. No visible pneumothorax. Lung volumes are low. Postoperative changes over the left axilla. No suspected cardiomegaly given technique. Streaky left perihilar opacity. IMPRESSION: 1. Porta catheter with tip near  the upper cavoatrial junction. No evidence of pneumothorax. 2. Low lung volumes with atelectasis. Electronically Signed   By: Monte Fantasia M.D.   On: 04/30/2017 19:04   Dg Fluoro Guide Cv Line-no Report  Result Date: 04/30/2017 Fluoroscopy was utilized by the requesting physician.  No radiographic interpretation.   [place] Lt Radioactive Seed Loc  Result Date: 04/29/2017 CLINICAL DATA:  Patient for preoperative localization prior to left breast lumpectomy. EXAM: ULTRASOUND GUIDED RADIOACTIVE SEED LOCALIZATION OF THE LEFT BREAST COMPARISON:  Previous exam(s). FINDINGS: Patient presents for radioactive seed localization prior to left breast lumpectomy. I met with the patient and we discussed the procedure of seed localization including benefits and alternatives. We discussed the high likelihood of a successful procedure. We discussed the risks of the procedure including infection, bleeding, tissue injury and further surgery. We discussed the low dose of radioactivity involved in the procedure. Informed, written consent was given. The usual time-out protocol  was performed immediately prior to the procedure. Using ultrasound guidance, sterile technique, 1% lidocaine and an I-125 radioactive seed, ribbon shaped marking clip and mass were localized using a lateral approach. The follow-up mammogram images confirm the seed in the expected location and were marked for Dr. Brantley Stage. Follow-up survey of the patient confirms presence of the radioactive seed. Order number of I-125 seed:  [id number]. Total activity: 3.568 millicuries Reference Date: 04/18/2017 The patient tolerated the procedure well and was released from the [facility]. She was given instructions regarding seed removal. IMPRESSION: Radioactive seed localization left breast. No apparent complications. Electronically Signed   By: Lovey Newcomer M.D.   On: 04/29/2017 14:41   Mm Clip Placement Left  Result Date: 04/29/2017 CLINICAL DATA:  Patient status post  ultrasound guided radioactive seed placement prior to left breast lumpectomy. EXAM: DIAGNOSTIC LEFT MAMMOGRAM POST ULTRASOUND-GUIDED RADIOACTIVE SEED PLACEMENT COMPARISON:  Previous exam(s). FINDINGS: Mammographic images were obtained following ultrasound-guided radioactive seed placement. These demonstrate radioactive seed within the mass and adjacent to the ribbon shaped biopsy marking clip within the upper-outer left breast. IMPRESSION: Appropriate location of the radioactive seed. Final Assessment: Post Procedure Mammograms for Seed Placement Electronically Signed   By: Lovey Newcomer M.D.   On: 04/29/2017 14:38      IMPRESSION/PLAN: Left Breast Cancer   It was a pleasure meeting the patient today. We discussed the risks, benefits, and side effects of radiotherapy. I recommend radiotherapy to the left breast to reduce her risk of locoregional recurrence by 2/3.  We discussed that radiation would take approximately 4  weeks to complete and that I would give the patient a few weeks to heal following surgery before starting treatment planning. As chemotherapy will be given, this will precede radiotherapy. We spoke about acute effects including skin irritation and fatigue as well as much less common late effects including internal organ injury or irritation. We spoke about the latest technology that is used to minimize the risk of late effects for patients undergoing radiotherapy to the breast or chest wall. No guarantees of treatment were given. The patient is enthusiastic about proceeding with treatment. I look forward to participating in the patient's care. We discussed and signed the consent form for radiation therapy. For her anxiety, I encouraged her to consult with our social work team for counseling or therapy as needed. She feels she is coping well overall right now.  I will await her referral back to me by medical oncology.   __________________________________________   Eppie Gibson, MD  This  document serves as a record of services personally performed by Eppie Gibson, MD. It was created on her behalf by Rae Lips, a trained medical scribe. The creation of this record is based on the scribe's personal observations and the provider's statements to them. This document has been checked and approved by the attending provider.

## 2017-05-14 ENCOUNTER — Encounter: Payer: Self-pay | Admitting: Genetics

## 2017-05-14 ENCOUNTER — Ambulatory Visit
Admission: RE | Admit: 2017-05-14 | Discharge: 2017-05-14 | Disposition: A | Discharge status: Home / Self Care | Payer: BC Managed Care – PPO | Source: Ambulatory Visit | Attending: Radiation Oncology | Admitting: Radiation Oncology

## 2017-05-14 ENCOUNTER — Encounter: Payer: Self-pay | Admitting: *Deleted

## 2017-05-14 ENCOUNTER — Ambulatory Visit (HOSPITAL_BASED_OUTPATIENT_CLINIC_OR_DEPARTMENT_OTHER): Payer: BC Managed Care – PPO | Admitting: Genetics

## 2017-05-14 ENCOUNTER — Encounter: Payer: Self-pay | Admitting: Radiation Oncology

## 2017-05-14 ENCOUNTER — Other Ambulatory Visit: Payer: BC Managed Care – PPO

## 2017-05-14 VITALS — BP 129/84 | HR 77 | Temp 98.3°F | Ht 65.0 in | Wt 238.8 lb

## 2017-05-14 DIAGNOSIS — E119 Type 2 diabetes mellitus without complications: Secondary | ICD-10-CM | POA: Diagnosis not present

## 2017-05-14 DIAGNOSIS — C50412 Malignant neoplasm of upper-outer quadrant of left female breast: Secondary | ICD-10-CM | POA: Diagnosis present

## 2017-05-14 DIAGNOSIS — Z171 Estrogen receptor negative status [ER-]: Secondary | ICD-10-CM | POA: Insufficient documentation

## 2017-05-14 NOTE — Progress Notes (Signed)
REFERRING PROVIDER: Gudena, Vinay, MD [address] [place], [place] [address]  PRIMARY PROVIDER:  Polite, Ronald, MD  PRIMARY REASON FOR VISIT:  1. Malignant neoplasm of upper-outer quadrant of left breast in female, estrogen receptor negative ([facility])      HISTORY OF PRESENT ILLNESS:   [name], a 61 y.o. female, was seen for a [facility] cancer genetics consultation at the request of Dr. Gudena due to a personal history of cancer.  [name] presents to clinic today to discuss the possibility of a hereditary predisposition to cancer, genetic testing, and to further clarify her future cancer risks, as well as potential cancer risks for family members.   In August 2018, at the age of 61, [name] was diagnosed with invasive ductal carcinoma of the left breast; ER-, PR-, HER2- (triple negative). She had a left breast lumpectomy on 04/30/2017 and will be having adjuvant chemotherapy.    CANCER HISTORY:    Malignant neoplasm of upper-outer quadrant of left breast in female, estrogen receptor negative ([facility])   04/05/2017 Initial Diagnosis    Left breast asymmetry by ultrasound measured 1.3 cm at 2:30 position 10 cm from nipple, no axillary lymph nodes; biopsy IDC grade 2, ER 0%, PR 0%, HER-2 negative ratio 1.37, Ki-67 40%, T1c N0 stage IB AJCC 8         HORMONAL RISK FACTORS:  Menarche was at age 13.  First live birth at age 24.  Ovaries intact: yes.  Hysterectomy: no.  Menopausal status: postmenopausal. Started late 30's early 40's HRT use: 0 years. Colonoscopy: yes; patient reports it was normal.   Past Medical History:  Diagnosis Date  . Anemia yrs ago  . Arthritis   . Breast cancer ([facility])   . Cancer ([facility])    recent dx in breast  . Carpal tunnel syndrome of right wrist   . Diabetes mellitus without complication ([facility])    dx 2008  . Headache    sinus  . Hypertension   . Sleep apnea    does not use cpap  . Vaginal delivery 1983    Past Surgical  History:  Procedure Laterality Date  . BREAST LUMPECTOMY WITH RADIOACTIVE SEED AND SENTINEL LYMPH NODE BIOPSY Left 04/30/2017   Procedure: LEFT BREAST LUMPECTOMY WITH RADIOACTIVE SEED AND LEFT SENTINEL LYMPH NODE BIOPSY ERAS PATHWAY;  Surgeon: Cornett, Thomas, MD;  Location[facility];  Service: General;  Laterality: Left;  . COLONOSCOPY WITH PROPOFOL N/A 05/28/2016   Procedure: COLONOSCOPY WITH PROPOFOL;  Surgeon: Martin K Johnson, MD;  [facility];  Service: Endoscopy;  Laterality: N/A;  . DILATION AND CURETTAGE OF UTERUS    . HYSTEROSCOPY W/D&C N/A 07/21/2015   Procedure: DILATATION AND CURETTAGE /HYSTEROSCOPY with myosure;  Surgeon: Jennifer Ozan, DO;  Location: [facility];  Service: Gynecology;  Laterality: N/A;  . PORTACATH PLACEMENT Right 04/30/2017   Procedure: INSERTION PORT-A-CATH;  Surgeon: Cornett, Thomas, MD;  Location[facility];  Service: General;  Laterality: Right;    Social History   Social History  . Marital status: Married    Spouse name: N/A  . Number of children: N/A  . Years of education: N/A   Social History Main Topics  . Smoking status: Never Smoker  . Smokeless tobacco: Never Used  . Alcohol use 0.0 oz/week     Comment: occ wine  . Drug use: No  . Sexual activity: Not Asked   Other Topics Concern  . None   Social History Narrative  . None       FAMILY HISTORY:  We obtained a detailed, 4-generation family history.  Significant diagnoses are listed below: Family History  Problem Relation Age of Onset  . Diabetes Mother   . Hypertension Mother    Audrey Peters has a 6 year-old son with no history of cancer.  He has 2 sons ages 5 and 55 months with no history of cancer.  Audrey Peters has a maternal half-brother who is in his 27's with no history of cancer.  This half-brother has several children, none with any history of cancer.   Audrey Peters does not know any information about her paternal side of the family.    Audrey Peters reports that her mother died  in her 53's/70's with no history of cancer.  Audrey Peters has 1 maternal aunt who died at 53 with no history of cancer.  This aunt has children who are in their 71's with no history of cancer.  Audrey Peters maternal grandparents both died in their 48's/90's and had no history of cancer.   Audrey Peters is unaware of previous family history of genetic testing for hereditary cancer risks. Patient's maternal ancestors are of African American descent, and paternal ancestors are of African American descent. There is no reported Ashkenazi Jewish ancestry. There is no known consanguinity.  GENETIC COUNSELING ASSESSMENT: Audrey Peters is a 61 y.o. 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 We, therefore, discussed and recommended the following at today's visit.   DISCUSSION: We reviewed the characteristics, features and inheritance patterns of hereditary cancer syndromes. We also discussed genetic testing, including the appropriate family members to test, the process of testing, insurance coverage and turn-around-time for results. We discussed the implications of a negative, positive and/or variant of uncertain significant result. We recommended Ms. Walgren pursue genetic testing for the Common Hereditary Cancer gene panel. The Hereditary Gene Panel offered by [facility] includes sequencing and/or deletion duplication testing of the following 46 genes: APC, ATM, AXIN2, BARD1, BMPR1A, BRCA1, BRCA2, BRIP1, CDH1, CDKN2A (p14ARF), CDKN2A (p16INK4a), CHEK2, CTNNA1, DICER1, EPCAM (Deletion/duplication testing only), GREM1 (promoter region deletion/duplication testing only), KIT, MEN1, MLH1, MSH2, MSH3, MSH6, MUTYH, NBN, NF1, NHTL1, PALB2, PDGFRA, PMS2, POLD1, POLE, PTEN, RAD50, RAD51C, RAD51D, SDHB, SDHC, SDHD, SMAD4, SMARCA4. STK11, TP53, TSC1, TSC2, and VHL.  The following genes were evaluated for sequence changes only: SDHA and HOXB13 c.251G>A variant  only.  We discussed that only 5-10% of cancers are associated with a Hereditary cancer predisposition syndrome.  One of the most common hereditary cancer syndromes that increases breast cancer risk is called Hereditary Breast and Ovarian Cancer (HBOC) syndrome.  This syndrome is caused by mutations in the BRCA1 and BRCA2 genes.  This syndrome increases an individual's lifetime risk to develop breast, ovarian, pancreatic, and other types of cancer.  There are also many other cancer predisposition syndromes caused by mutations in several other genes.  We discussed that if she is found to have a mutation in one of these genes, it may impact future medical management recommendations such as increased cancer screenings and consideration of risk reducing surgeries.  A positive result could also have implications for the patient's family members.  A Negative result would mean we were unable to identify a hereditary component to her cancer, but does not rule out the possibility of a hereditary basis for her cancer.  There could be mutations that are undetectable by current technology, or in genes not yet tested or identified to increase cancer risk.  We discussed the potential to find a Variant of Uncertain Significance or VUS.  These are variants that have not yet been identified as pathogenic or benign, and it is unknown if this variant is associated with increased cancer risk or if this is a normal finding.  Most VUS's are reclassified to benign or likely benign.   It should not be used to make medical management decisions. With time, we suspect the lab will determine the significance of any VUS's identified if any.   Based on Ms. Artley's personal history of triple negative breast cancer, she meets medical criteria for genetic testing. Despite that she meets criteria, she may still have an out of pocket cost. We discussed that if her out of pocket cost for testing is over $100, the laboratory will call and  confirm whether she wants to proceed with testing.  If the out of pocket cost of testing is less than $100 she will be billed by the genetic testing laboratory.   PLAN: After considering the risks, benefits, and limitations, Ms. Kalla  provided informed consent to pursue genetic testing and the blood sample was sent to [facility] for analysis of the Common Hereditary Cancer Panel. Results should be available within approximately 2-3 weeks' time, at which point they will be disclosed by telephone to Ms. Schiele, as will any additional recommendations warranted by these results. Ms. Pardee will receive a summary of her genetic counseling visit and a copy of her results once available. This information will also be available in Epic. We encouraged Ms. Emley to remain in contact with cancer genetics annually so that we can continuously update the family history and inform her of any changes in cancer genetics and testing that may be of benefit for her family. Ms. Brookens's questions were answered to her satisfaction today. Our contact information was provided should additional questions or concerns arise.  Lastly, we encouraged Ms. Paladino to remain in contact with cancer genetics annually so that we can continuously update the family history and inform her of any changes in cancer genetics and testing that may be of benefit for this family.   Ms.  Letourneau's questions were answered to her satisfaction today. Our contact information was provided should additional questions or concerns arise. Thank you for the referral and allowing us to share in the care of your patient.    J. , MS Genetic Counselor [email or web address] phone: [phone]  The patient was seen for a total of 30 minutes in face-to-face genetic counseling.  The patient was accompanied today by her husband, Thomas.  This patient was discussed with Drs. Magrinat, Gudena and/or Feng who agrees with the above.   

## 2017-05-16 ENCOUNTER — Other Ambulatory Visit: Payer: Self-pay | Admitting: Hematology and Oncology

## 2017-05-16 ENCOUNTER — Other Ambulatory Visit: Payer: Self-pay

## 2017-05-16 DIAGNOSIS — Z171 Estrogen receptor negative status [ER-]: Principal | ICD-10-CM

## 2017-05-16 DIAGNOSIS — C50412 Malignant neoplasm of upper-outer quadrant of left female breast: Secondary | ICD-10-CM

## 2017-05-16 MED ORDER — PROCHLORPERAZINE MALEATE 10 MG PO TABS: 10.0000 mg | ORAL_TABLET | Freq: Four times a day (QID) | ORAL | 1 refills | Status: DC | PRN

## 2017-05-16 MED ORDER — LIDOCAINE-PRILOCAINE 2.5-2.5 % EX CREA: TOPICAL_CREAM | CUTANEOUS | 3 refills | Status: DC

## 2017-05-16 MED ORDER — LORAZEPAM 0.5 MG PO TABS: 0.5000 mg | ORAL_TABLET | Freq: Every day | ORAL | 0 refills | Status: DC

## 2017-05-16 MED ORDER — DEXAMETHASONE 4 MG PO TABS: 4.0000 mg | ORAL_TABLET | Freq: Every day | ORAL | 0 refills | Status: DC

## 2017-05-16 MED ORDER — ONDANSETRON HCL 8 MG PO TABS: 8.0000 mg | ORAL_TABLET | Freq: Two times a day (BID) | ORAL | 1 refills | Status: DC | PRN

## 2017-05-17 ENCOUNTER — Other Ambulatory Visit: Payer: BC Managed Care – PPO

## 2017-05-21 ENCOUNTER — Encounter: Payer: Self-pay | Admitting: *Deleted

## 2017-05-21 NOTE — Progress Notes (Signed)
[place] Psychosocial Distress Screening Clinical Social Work  Clinical Social Work was referred by distress screening protocol.  The patient scored a 6 on the Psychosocial Distress Thermometer which indicates moderate distress. Clinical Social Worker contacted patient at home to assess for distress and other psychosocial needs.  Patient stated she was feeling anxious about treatment, but was also "ready to get started".  CSW and patient discussed common feelings and emotions with treatment.  CSW informed patient of the support team and support services at [facility], and patient was agreeable to an [facility] guide referral.  CSW provided contact information and encouraged patient to call with questions or concerns.     ONCBCN DISTRESS SCREENING 05/14/2017  Screening Type Initial Screening  Distress experienced in past week (1-10) 6  Practical problem type Insurance  Emotional problem type Adjusting to illness;Nervousness/Anxiety;Isolation/feeling alone  Spiritual/Religous concerns type Relating to God  Physical Problem type Pain;Sleep/insomnia;Tingling hands/feet     Johnnye Lana, MSW, LCSW, OSW-C Clinical Social Worker [facility] [phone]

## 2017-05-23 ENCOUNTER — Ambulatory Visit: Payer: Self-pay | Admitting: Genetics

## 2017-05-23 ENCOUNTER — Telehealth: Payer: Self-pay | Admitting: Genetics

## 2017-05-23 ENCOUNTER — Encounter: Payer: Self-pay | Admitting: Genetics

## 2017-05-23 DIAGNOSIS — Z171 Estrogen receptor negative status [ER-]: Principal | ICD-10-CM

## 2017-05-23 DIAGNOSIS — Z1379 Encounter for other screening for genetic and chromosomal anomalies: Secondary | ICD-10-CM

## 2017-05-23 DIAGNOSIS — C50412 Malignant neoplasm of upper-outer quadrant of left female breast: Secondary | ICD-10-CM

## 2017-05-23 NOTE — Progress Notes (Signed)
HPI: Ms. Bronaugh was previously seen in the [facility] on 05/14/2017 due to a personal history of triple negative breast cancer and concerns regarding a hereditary predisposition to cancer. Please refer to our prior cancer genetics clinic note for more information regarding Ms. Reimann medical, social and family histories, and our assessment and recommendations, at the time. Ms. Diekmann recent genetic test results were disclosed to her, as well as recommendations warranted by these results. These results and recommendations are discussed in more detail below.  CANCER HISTORY:    Malignant neoplasm of upper-outer quadrant of left breast in female, estrogen receptor negative ([place])   04/05/2017 Initial Diagnosis    Left breast asymmetry by ultrasound measured 1.3 cm at 2:30 position 10 cm from nipple, no axillary lymph nodes; biopsy IDC grade 2, ER 0%, PR 0%, HER-2 negative ratio 1.37, Ki-67 40%, T1c N0 stage IB AJCC 8       05/22/2017 Genetic Testing    Patient had genetic testing due to a personal history of triple negative breast cancer.  The Common Hereditary Cancer Panel was ordered. The Hereditary Gene Panel offered by [facility] includes sequencing and/or deletion duplication testing of the following 46 genes: APC, ATM, AXIN2, BARD1, BMPR1A, BRCA1, BRCA2, BRIP1, CDH1, CDKN2A (p14ARF), CDKN2A (p16INK4a), CHEK2, CTNNA1, DICER1, EPCAM (Deletion/duplication testing only), GREM1 (promoter region deletion/duplication testing only), KIT, MEN1, MLH1, MSH2, MSH3, MSH6, MUTYH, NBN, NF1, NHTL1, PALB2, PDGFRA, PMS2, POLD1, POLE, PTEN, RAD50, RAD51C, RAD51D, SDHB, SDHC, SDHD, SMAD4, SMARCA4. STK11, TP53, TSC1, TSC2, and VHL.  The following genes were evaluated for sequence changes only: SDHA and HOXB13 c.251G>A variant only.    Results: No pathogenic mutations identified.  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  The date of this test report is 05/22/2017.           FAMILY HISTORY:  We obtained a detailed, 4-generation family history.  Significant diagnoses are listed below: Family History  Problem Relation Age of Onset  . Diabetes Mother   . Hypertension Mother    Ms. Jastrzebski has a 73 year-old son with no history of cancer.  He has 2 sons ages 95 and 46 months with no history of cancer.  Ms. Melendrez has a maternal half-brother who is in his 59's with no history of cancer.  This half-brother has several children, none with any history of cancer.   Ms. Ferrucci does not know any information about her paternal side of the family.    Ms. Swartzentruber reports that her mother died in her 32's/70's with no history of cancer.  Ms. Ballentine has 1 maternal aunt who died at 33 with no history of cancer.  This aunt has children who are in their 34's with no history of cancer.  Ms. Olenick maternal grandparents both died in their 31's/90's and had no history of cancer.   Ms. Stillson is unaware of previous family history of genetic testing for hereditary cancer risks. Patient's maternal ancestors are of African American descent, and paternal ancestors are of African American descent. There is no reported Ashkenazi Jewish ancestry. There is no known consanguinity.   GENETIC TEST RESULTS: Genetic testing performed through [facility]'s Common Hereditary Cancers Panel reported out on 05/22/2017 showed no pathogenic mutations. The Hereditary Gene Panel offered by [facility] includes sequencing and/or deletion duplication testing of the following 46 genes: APC, ATM, AXIN2, BARD1, BMPR1A, BRCA1, BRCA2, BRIP1, CDH1, CDKN2A (p14ARF), CDKN2A (p16INK4a), CHEK2, CTNNA1, DICER1, EPCAM (Deletion/duplication testing only), GREM1 (promoter region deletion/duplication testing only),  KIT, MEN1, MLH1, MSH2, MSH3, MSH6, MUTYH, NBN, NF1, NHTL1, PALB2, PDGFRA, PMS2, POLD1, POLE, PTEN, RAD50, RAD51C, RAD51D, SDHB, SDHC, SDHD, SMAD4, SMARCA4. STK11, TP53, TSC1, TSC2, and VHL.  The following genes were  evaluated for sequence changes only: SDHA and HOXB13 c.251G>A variant only..  A variant of uncertain significance (VUS) in a gene called ATM was also noted. c.4279G>A (p.Ala1427Thr)  The test report will be scanned into EPIC and will be located under the Molecular Pathology section of the Results Review tab.A portion of the result report is included below for reference.      We discussed with Ms. Garrison that because current genetic testing is not perfect, it is possible there may be a gene mutation in one of these genes that current testing cannot detect, but that chance is small. We also discussed, that there could be another gene that has not yet been discovered, or that we have not yet tested, that is responsible for the cancer diagnoses in the family. Therefore, it is important to remain in touch with cancer genetics in the future so that we can continue to offer Ms. Treanor the most up to date genetic testing.   Regarding the VUS in ATM: At this time, it is unknown if this variant is associated with increased cancer risk or if this is a normal finding, but most variants such as this get reclassified to being inconsequential. It should not be used to make medical management decisions. With time, we suspect the lab will determine the significance of this variant, if any. If we do learn more about it, we will try to contact Ms. Domingos to discuss it further. However, it is important to stay in touch with [place] periodically and keep the address and phone number up to date.  ADDITIONAL GENETIC TESTING: We discussed with Ms. Dizdarevic that there are other genes that are associated with increased cancer risk that can be analyzed. The laboratories that offer this testing look at these additional genes via a hereditary cancer gene panel. Should Ms. Rua wish to pursue additional genetic testing, we are happy to discuss and coordinate this testing, at any time.    CANCER SCREENING RECOMMENDATIONS: This  result indicates that it is unlikely Ms. Caccamo has an increased risk for a future cancer due to a mutation in one of these genes. This normal test also suggests that Ms. Fedorko cancer was most likely not due to an inherited predisposition associated with one of these genes.  Most cancers happen by chance and this negative test suggests that her cancer may fall into this category.  Therefore, it is recommended she continue to follow the cancer management and screening guidelines provided by her oncology and primary healthcare provider. Other factors such as her personal and family history may still affect her cancer risk.    RECOMMENDATIONS FOR FAMILY MEMBERS: Women in this family might be at some increased risk of developing cancer, over the general population risk, simply due to the family history of cancer. We recommended women in this family have a yearly mammogram beginning at age 27, or 50 years younger than the earliest onset of cancer, an annual clinical breast exam, and perform monthly breast self-exams. Women in this family should also have a gynecological exam as recommended by their primary provider. All family members should have a colonoscopy by age 81.  FOLLOW-UP: Lastly, we discussed with Ms. Mirabella that cancer genetics is a rapidly advancing field and it is possible that new genetic tests will  be appropriate for her and/or her family members in the future. We encouraged her to remain in contact with cancer genetics on an annual basis so we can update her personal and family histories and let her know of advances in cancer genetics that may benefit this family.   Our contact number was provided. Ms. Kozma questions were answered to her satisfaction, and she knows she is welcome to call us at anytime with additional questions or concerns.   Ferol Luz, MS Genetic Counselor [email or web address]

## 2017-05-23 NOTE — Telephone Encounter (Signed)
Revealed negative genetic testing.  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   This normal result is reassuring and indicates that it is unlikely Audrey Peters cancer is due to a hereditary cause.  It is unlikely that there is an increased risk of another cancer due to a mutation in one of these genes.  However, genetic testing is not perfect, and cannot definitively rule out a hereditary cause.  It will be important for her to keep in contact with genetics to learn if any additional testing may be needed in the future.    I will leave a copy of her test results with our admin staff, Hassan Rowan, in the support staff lobby for her to pick up at her convenience tomorrow while she is at the cancer center.

## 2017-05-24 ENCOUNTER — Encounter: Payer: Self-pay | Admitting: *Deleted

## 2017-05-24 ENCOUNTER — Ambulatory Visit: Payer: BC Managed Care – PPO

## 2017-05-24 ENCOUNTER — Ambulatory Visit (HOSPITAL_BASED_OUTPATIENT_CLINIC_OR_DEPARTMENT_OTHER): Payer: BC Managed Care – PPO | Admitting: Hematology and Oncology

## 2017-05-24 ENCOUNTER — Other Ambulatory Visit (HOSPITAL_BASED_OUTPATIENT_CLINIC_OR_DEPARTMENT_OTHER): Payer: BC Managed Care – PPO

## 2017-05-24 ENCOUNTER — Ambulatory Visit (HOSPITAL_BASED_OUTPATIENT_CLINIC_OR_DEPARTMENT_OTHER): Payer: BC Managed Care – PPO

## 2017-05-24 DIAGNOSIS — Z171 Estrogen receptor negative status [ER-]: Secondary | ICD-10-CM

## 2017-05-24 DIAGNOSIS — C50412 Malignant neoplasm of upper-outer quadrant of left female breast: Secondary | ICD-10-CM

## 2017-05-24 DIAGNOSIS — Z5111 Encounter for antineoplastic chemotherapy: Secondary | ICD-10-CM

## 2017-05-24 DIAGNOSIS — Z95828 Presence of other vascular implants and grafts: Secondary | ICD-10-CM

## 2017-05-24 LAB — COMPREHENSIVE METABOLIC PANEL
ALT: 64 U/L — ABNORMAL HIGH (ref 0–55)
AST: 72 U/L — AB (ref 5–34)
Albumin: 3.4 g/dL — ABNORMAL LOW (ref 3.5–5.0)
Alkaline Phosphatase: 70 U/L (ref 40–150)
Anion Gap: 10 mEq/L (ref 3–11)
BUN: 18.4 mg/dL (ref 7.0–26.0)
CALCIUM: 8.9 mg/dL (ref 8.4–10.4)
CHLORIDE: 106 meq/L (ref 98–109)
CO2: 23 mEq/L (ref 22–29)
Creatinine: 0.9 mg/dL (ref 0.6–1.1)
Glucose: 190 mg/dl — ABNORMAL HIGH (ref 70–140)
POTASSIUM: 3.6 meq/L (ref 3.5–5.1)
SODIUM: 139 meq/L (ref 136–145)
Total Bilirubin: 0.41 mg/dL (ref 0.20–1.20)
Total Protein: 7 g/dL (ref 6.4–8.3)

## 2017-05-24 LAB — CBC WITH DIFFERENTIAL/PLATELET
BASO%: 0.3 % (ref 0.0–2.0)
BASOS ABS: 0 10*3/uL (ref 0.0–0.1)
EOS%: 2.1 % (ref 0.0–7.0)
Eosinophils Absolute: 0.1 10*3/uL (ref 0.0–0.5)
HEMATOCRIT: 37.1 % (ref 34.8–46.6)
HGB: 12.1 g/dL (ref 11.6–15.9)
LYMPH%: 35.6 % (ref 14.0–49.7)
MCH: 28.2 pg (ref 25.1–34.0)
MCHC: 32.5 g/dL (ref 31.5–36.0)
MCV: 86.6 fL (ref 79.5–101.0)
MONO#: 0.5 10*3/uL (ref 0.1–0.9)
MONO%: 7.7 % (ref 0.0–14.0)
NEUT#: 3.6 10*3/uL (ref 1.5–6.5)
NEUT%: 54.3 % (ref 38.4–76.8)
Platelets: 184 10*3/uL (ref 145–400)
RBC: 4.29 10*6/uL (ref 3.70–5.45)
RDW: 13.2 % (ref 11.2–14.5)
WBC: 6.7 10*3/uL (ref 3.9–10.3)
lymph#: 2.4 10*3/uL (ref 0.9–3.3)

## 2017-05-24 MED ORDER — SODIUM CHLORIDE 0.9 % IV SOLN
600.0000 mg/m2 | Freq: Once | INTRAVENOUS | Status: AC
  Administered 2017-05-24: 1340 mg via INTRAVENOUS
  Filled 2017-05-24: qty 67

## 2017-05-24 MED ORDER — SODIUM CHLORIDE 0.9% FLUSH
10.0000 mL | INTRAVENOUS | Status: DC | PRN
  Administered 2017-05-24: 10 mL
  Filled 2017-05-24: qty 10

## 2017-05-24 MED ORDER — PALONOSETRON HCL INJECTION 0.25 MG/5ML
INTRAVENOUS | Status: AC
  Filled 2017-05-24: qty 5

## 2017-05-24 MED ORDER — HEPARIN SOD (PORK) LOCK FLUSH 100 UNIT/ML IV SOLN
500.0000 [IU] | Freq: Once | INTRAVENOUS | Status: AC | PRN
Start: 2017-05-24 — End: 2017-05-24
  Administered 2017-05-24: 500 [IU]
  Filled 2017-05-24: qty 5

## 2017-05-24 MED ORDER — SODIUM CHLORIDE 0.9 % IV SOLN
Freq: Once | INTRAVENOUS | Status: AC
  Administered 2017-05-24: 12:00:00 via INTRAVENOUS
  Filled 2017-05-24: qty 5

## 2017-05-24 MED ORDER — PEGFILGRASTIM 6 MG/0.6ML ~~LOC~~ PSKT
6.0000 mg | PREFILLED_SYRINGE | Freq: Once | SUBCUTANEOUS | Status: AC
  Administered 2017-05-24: 6 mg via SUBCUTANEOUS
  Filled 2017-05-24: qty 0.6

## 2017-05-24 MED ORDER — DOXORUBICIN HCL CHEMO IV INJECTION 2 MG/ML
60.0000 mg/m2 | Freq: Once | INTRAVENOUS | Status: AC
  Administered 2017-05-24: 134 mg via INTRAVENOUS
  Filled 2017-05-24: qty 67

## 2017-05-24 MED ORDER — SODIUM CHLORIDE 0.9 % IV SOLN
Freq: Once | INTRAVENOUS | Status: AC
  Administered 2017-05-24: 12:00:00 via INTRAVENOUS

## 2017-05-24 MED ORDER — SODIUM CHLORIDE 0.9% FLUSH
10.0000 mL | INTRAVENOUS | Status: DC | PRN
  Administered 2017-05-24: 10 mL via INTRAVENOUS
  Filled 2017-05-24: qty 10

## 2017-05-24 MED ORDER — PALONOSETRON HCL INJECTION 0.25 MG/5ML
0.2500 mg | Freq: Once | INTRAVENOUS | Status: AC
  Administered 2017-05-24: 0.25 mg via INTRAVENOUS

## 2017-05-24 NOTE — Progress Notes (Signed)
Patient Care Team: [name], MD as PCP - General (Internal Medicine)  DIAGNOSIS:  Encounter Diagnosis  Name Primary?  . Malignant neoplasm of upper-outer quadrant of left breast in female, estrogen receptor negative ([place])     SUMMARY OF ONCOLOGIC HISTORY:   Malignant neoplasm of upper-outer quadrant of left breast in female, estrogen receptor negative ([place])   04/05/2017 Initial Diagnosis    Left breast asymmetry by ultrasound measured 1.3 cm at 2:30 position 10 cm from nipple, no axillary lymph nodes; biopsy IDC grade 2, ER 0%, PR 0%, HER-2 negative ratio 1.37, Ki-67 40%, T1c N0 stage IB AJCC 8       04/30/2017 Surgery    Left lumpectomy: IDC grade 3, 1.7 cm, DCIS, lymphovascular invasion present, margins negative, 0/1 lymph node negative, ER 0%, PR 0%, HER-2 negative ratio 1.37, Ki-67 40%, T1c N0 stage IB      05/22/2017 Genetic Testing    Patient had genetic testing due to a personal history of triple negative breast cancer.  The Common Hereditary Cancer Panel was ordered. The Hereditary Gene Panel offered by [facility] includes sequencing and/or deletion duplication testing of the following 46 genes: APC, ATM, AXIN2, BARD1, BMPR1A, BRCA1, BRCA2, BRIP1, CDH1, CDKN2A (p14ARF), CDKN2A (p16INK4a), CHEK2, CTNNA1, DICER1, EPCAM (Deletion/duplication testing only), GREM1 (promoter region deletion/duplication testing only), KIT, MEN1, MLH1, MSH2, MSH3, MSH6, MUTYH, NBN, NF1, NHTL1, PALB2, PDGFRA, PMS2, POLD1, POLE, PTEN, RAD50, RAD51C, RAD51D, SDHB, SDHC, SDHD, SMAD4, SMARCA4. STK11, TP53, TSC1, TSC2, and VHL.  The following genes were evaluated for sequence changes only: SDHA and HOXB13 c.251G>A variant only.    Results: No pathogenic mutations identified.  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  The date of this test report is 05/22/2017.       05/24/2017 -  Chemotherapy    Dose dense Adriamycin and Cytoxan 4 followed by Taxol weekly 12        CHIEF COMPLIANT:  Cycle 1 day 1 dose dense Adriamycin and Cytoxan  INTERVAL HISTORY: Audrey Peters is a 61-year-old with left breast cancer underwent lumpectomy for triple negative disease and is here today to start cycle 1 of adjuvant chemotherapy with dose dense Adriamycin and Cytoxan. She has healed very well from the prior surgery. She is anxious to get started the chemotherapy.  REVIEW OF SYSTEMS:   Constitutional: Denies fevers, chills or abnormal weight loss Eyes: Denies blurriness of vision Ears, nose, mouth, throat, and face: Denies mucositis or sore throat Respiratory: Denies cough, dyspnea or wheezes Cardiovascular: Denies palpitation, chest discomfort Gastrointestinal:  Denies nausea, heartburn or change in bowel habits Skin: Denies abnormal skin rashes Lymphatics: Denies new lymphadenopathy or easy bruising Neurological:Denies numbness, tingling or new weaknesses Behavioral/Psych: Mood is stable, no new changes  Extremities: No lower extremity edema Breast: Left lumpectomy All other systems were reviewed with the patient and are negative.  I have reviewed the past medical history, past surgical history, social history and family history with the patient and they are unchanged from previous note.  ALLERGIES:  is allergic to invokana [canagliflozin]; sulfa antibiotics; and ciprofloxacin.  MEDICATIONS:  Current Outpatient Prescriptions  Medication Sig Dispense Refill  . acetaminophen (TYLENOL 8 HOUR ARTHRITIS PAIN) 650 MG CR tablet Take 650 mg by mouth every 8 (eight) hours as needed for pain.    . clotrimazole (LOTRIMIN) 1 % cream Apply 1 application topically daily as needed (for irritated/itchy skin.).     Marland Kitchen dexamethasone (DECADRON) 4 MG tablet Take 1 tablet (4 mg total)  by mouth daily. Take 1 tablet daily after chemotherapy and one tablet 2 days after chemotherapy with food 8 tablet 0  . Dulaglutide (TRULICITY) 1.5 IA/1.6PV SOPN Inject 1.5 mg into the skin every Tuesday.    . estradiol  (ESTRACE) 0.1 MG/GM vaginal cream Place 1 Applicatorful vaginally 2 (two) times a week.    Marland Kitchen ibuprofen (ADVIL,MOTRIN) 800 MG tablet Take 1 tablet (800 mg total) by mouth every 8 (eight) hours as needed. 30 tablet 0  . insulin regular human CONCENTRATED (HUMULIN R U-500 KWIKPEN) 500 UNIT/ML kwikpen Inject 50-120 Units into the skin 3 (three) times daily with meals. 120 units in the morning, 50 units at lunch and 50 units at supper.    . lidocaine-prilocaine (EMLA) cream Apply to affected area once 30 g 3  . LORazepam (ATIVAN) 0.5 MG tablet Take 1 tablet (0.5 mg total) by mouth at bedtime. 30 tablet 0  . losartan (COZAAR) 100 MG tablet Take 100 mg by mouth daily.    . meloxicam (MOBIC) 15 MG tablet Take 15 mg by mouth daily.    . metFORMIN (GLUCOPHAGE) 1000 MG tablet Take 1,000 mg by mouth daily.    . ondansetron (ZOFRAN) 8 MG tablet Take 1 tablet (8 mg total) by mouth 2 (two) times daily as needed. Start on the third day after chemotherapy. 30 tablet 1  . oxyCODONE (OXY IR/ROXICODONE) 5 MG immediate release tablet Take 1-2 tablets (5-10 mg total) by mouth every 6 (six) hours as needed for severe pain. (Patient not taking: Reported on 05/14/2017) 20 tablet 0  . prochlorperazine (COMPAZINE) 10 MG tablet Take 1 tablet (10 mg total) by mouth every 6 (six) hours as needed (Nausea or vomiting). 30 tablet 1  . triamterene-hydrochlorothiazide (MAXZIDE-25) 37.5-25 MG per tablet Take 1 tablet by mouth daily.      No current facility-administered medications for this visit.    Facility-Administered Medications Ordered in Other Visits  Medication Dose Route Frequency Provider Last Rate Last Dose  . sodium chloride flush (NS) 0.9 % injection 10 mL  10 mL Intravenous PRN [name], MD   10 mL at 05/24/17 1000    PHYSICAL EXAMINATION: ECOG PERFORMANCE STATUS: 1 - Symptomatic but completely ambulatory  Vitals:   05/24/17 1024  BP: 134/73  Pulse: 81  Resp: 16  Temp: 98 F (36.7 C)  SpO2: 98%   Filed  Weights   05/24/17 1024  Weight: 242 lb 6.4 oz (110 kg)    GENERAL:alert, no distress and comfortable SKIN: skin color, texture, turgor are normal, no rashes or significant lesions EYES: normal, Conjunctiva are pink and non-injected, sclera clear OROPHARYNX:no exudate, no erythema and lips, buccal mucosa, and tongue normal  NECK: supple, thyroid normal size, non-tender, without nodularity LYMPH:  no palpable lymphadenopathy in the cervical, axillary or inguinal LUNGS: clear to auscultation and percussion with normal breathing effort HEART: regular rate & rhythm and no murmurs and no lower extremity edema ABDOMEN:abdomen soft, non-tender and normal bowel sounds MUSCULOSKELETAL:no cyanosis of digits and no clubbing  NEURO: alert & oriented x 3 with fluent speech, no focal motor/sensory deficits EXTREMITIES: No lower extremity edema  LABORATORY DATA:  I have reviewed the data as listed   Chemistry      Component Value Date/Time   NA 137 04/25/2017 0946   K 3.5 04/25/2017 0946   CL 105 04/25/2017 0946   CO2 26 04/25/2017 0946   BUN 19 04/25/2017 0946   CREATININE 0.93 04/25/2017 0946  Component Value Date/Time   CALCIUM 8.9 04/25/2017 0946   ALKPHOS 67 04/25/2017 0946   AST 37 04/25/2017 0946   ALT 38 04/25/2017 0946   BILITOT 0.5 04/25/2017 0946       Lab Results  Component Value Date   WBC 8.2 04/25/2017   HGB 12.7 04/25/2017   HCT 40.4 04/25/2017   MCV 88.6 04/25/2017   PLT 207 04/25/2017   NEUTROABS 4.3 04/25/2017    ASSESSMENT & PLAN:  Malignant neoplasm of upper-outer quadrant of left breast in female, estrogen receptor negative ([place]) 04/30/2017: Left lumpectomy: IDC grade 3, 1.7 cm, DCIS, lymphovascular invasion present, margins negative, 0/1 lymph node negative, ER 0%, PR 0%, HER-2 negative ratio 1.37, Ki-67 40%, T1c N0 stage IB  Pathology counseling: I discussed the final pathology report of the patient provided  a copy of this report. I discussed the  margins as well as lymph node surgeries. We also discussed the final staging along with previously performed ER/PR and HER-2/neu testing.  Recommendation: 1. adjuvant chemotherapy with dose dense Adriamycin and Cytoxan 4 followed by Taxol weekly 12 ( cannot use steroids because patient is diabetic) 2. Followed by radiation --------------------------------------------------------------------------------------------------------------------------- Current treatment: Cycle 1 day 1 dose dense Adriamycin and Cytoxan Chemotherapy consent obtained Labs reviewed Antiemetics were reviewed Echocardiogram 04/23/2017: EF 55-60%  Return to clinic in one week for toxicity check  I spent 25 minutes talking to the patient of which more than half was spent in counseling and coordination of care.  No orders of the defined types were placed in this encounter.  The patient has a good understanding of the overall plan. she agrees with it. she will call with any problems that may develop before the next visit here.   Rulon Eisenmenger, MD 05/24/17

## 2017-05-24 NOTE — Patient Instructions (Signed)
[facility] Discharge Instructions for Patients Receiving Chemotherapy  Today you received the following chemotherapy agents Adriamycin and Cytoxan  To help prevent nausea and vomiting after your treatment, we encourage you to take your nausea medication as directed   If you develop nausea and vomiting that is not controlled by your nausea medication, call the clinic.   BELOW ARE SYMPTOMS THAT SHOULD BE REPORTED IMMEDIATELY:  *FEVER GREATER THAN 100.5 F  *CHILLS WITH OR WITHOUT FEVER  NAUSEA AND VOMITING THAT IS NOT CONTROLLED WITH YOUR NAUSEA MEDICATION  *UNUSUAL SHORTNESS OF BREATH  *UNUSUAL BRUISING OR BLEEDING  TENDERNESS IN MOUTH AND THROAT WITH OR WITHOUT PRESENCE OF ULCERS  *URINARY PROBLEMS  *BOWEL PROBLEMS  UNUSUAL RASH Items with * indicate a potential emergency and should be followed up as soon as possible.  Feel free to call the clinic should you have any questions or concerns. The clinic phone number is [phone].  Please show the [facility] at check-in to the Emergency Department and triage nurse.   Doxorubicin  (Adriamycin) injection What is this medicine? DOXORUBICIN (dox oh ROO bi sin) is a chemotherapy drug. It is used to treat many kinds of cancer like leukemia, lymphoma, neuroblastoma, sarcoma, and Wilms' tumor. It is also used to treat bladder cancer, breast cancer, lung cancer, ovarian cancer, stomach cancer, and thyroid cancer. This medicine may be used for other purposes; ask your health care provider or pharmacist if you have questions. COMMON BRAND NAME(S): Adriamycin, Adriamycin PFS, Adriamycin RDF, Rubex What should I tell my health care provider before I take this medicine? They need to know if you have any of these conditions: -heart disease -history of low blood counts caused by a medicine -liver disease -recent or ongoing radiation therapy -an unusual or allergic reaction to doxorubicin, other chemotherapy agents,  other medicines, foods, dyes, or preservatives -pregnant or trying to get pregnant -breast-feeding How should I use this medicine? This drug is given as an infusion into a vein. It is administered in a hospital or clinic by a specially trained health care professional. If you have pain, swelling, burning or any unusual feeling around the site of your injection, tell your health care professional right away. Talk to your pediatrician regarding the use of this medicine in children. Special care may be needed. Overdosage: If you think you have taken too much of this medicine contact a poison control center or emergency room at once. NOTE: This medicine is only for you. Do not share this medicine with others. What if I miss a dose? It is important not to miss your dose. Call your doctor or health care professional if you are unable to keep an appointment. What may interact with this medicine? This medicine may interact with the following medications: -6-mercaptopurine -paclitaxel -phenytoin -St. John's Wort -trastuzumab -verapamil This list may not describe all possible interactions. Give your health care provider a list of all the medicines, herbs, non-prescription drugs, or dietary supplements you use. Also tell them if you smoke, drink alcohol, or use illegal drugs. Some items may interact with your medicine. What should I watch for while using this medicine? This drug may make you feel generally unwell. This is not uncommon, as chemotherapy can affect healthy cells as well as cancer cells. Report any side effects. Continue your course of treatment even though you feel ill unless your doctor tells you to stop. There is a maximum amount of this medicine you should receive throughout your life. The  amount depends on the medical condition being treated and your overall health. Your doctor will watch how much of this medicine you receive in your lifetime. Tell your doctor if you have taken this  medicine before. You may need blood work done while you are taking this medicine. Your urine may turn red for a few days after your dose. This is not blood. If your urine is dark or brown, call your doctor. In some cases, you may be given additional medicines to help with side effects. Follow all directions for their use. Call your doctor or health care professional for advice if you get a fever, chills or sore throat, or other symptoms of a cold or flu. Do not treat yourself. This drug decreases your body's ability to fight infections. Try to avoid being around people who are sick. This medicine may increase your risk to bruise or bleed. Call your doctor or health care professional if you notice any unusual bleeding. Talk to your doctor about your risk of cancer. You may be more at risk for certain types of cancers if you take this medicine. Do not become pregnant while taking this medicine or for 6 months after stopping it. Women should inform their doctor if they wish to become pregnant or think they might be pregnant. Men should not father a child while taking this medicine and for 6 months after stopping it. There is a potential for serious side effects to an unborn child. Talk to your health care professional or pharmacist for more information. Do not breast-feed an infant while taking this medicine. This medicine has caused ovarian failure in some women and reduced sperm counts in some men This medicine may interfere with the ability to have a child. Talk with your doctor or health care professional if you are concerned about your fertility. What side effects may I notice from receiving this medicine? Side effects that you should report to your doctor or health care professional as soon as possible: -allergic reactions like skin rash, itching or hives, swelling of the face, lips, or tongue -breathing problems -chest pain -fast or irregular heartbeat -low blood counts - this medicine may  decrease the number of white blood cells, red blood cells and platelets. You may be at increased risk for infections and bleeding. -pain, redness, or irritation at site where injected -signs of infection - fever or chills, cough, sore throat, pain or difficulty passing urine -signs of decreased platelets or bleeding - bruising, pinpoint red spots on the skin, black, tarry stools, blood in the urine -swelling of the ankles, feet, hands -tiredness -weakness Side effects that usually do not require medical attention (report to your doctor or health care professional if they continue or are bothersome): -diarrhea -hair loss -mouth sores -nail discoloration or damage -nausea -red colored urine -vomiting This list may not describe all possible side effects. Call your doctor for medical advice about side effects. You may report side effects to FDA at 1-800-FDA-1088. Where should I keep my medicine? This drug is given in a hospital or clinic and will not be stored at home. NOTE: This sheet is a summary. It may not cover all possible information. If you have questions about this medicine, talk to your doctor, pharmacist, or health care provider.  2018 Elsevier/Gold Standard (2015-09-26 11:28:51)   Cyclophosphamide (Cytoxan) injection What is this medicine? CYCLOPHOSPHAMIDE (sye kloe FOSS fa mide) is a chemotherapy drug. It slows the growth of cancer cells. This medicine is used to treat many  types of cancer like lymphoma, myeloma, leukemia, breast cancer, and ovarian cancer, to name a few. This medicine may be used for other purposes; ask your health care provider or pharmacist if you have questions. COMMON BRAND NAME(S): Cytoxan, Neosar What should I tell my health care provider before I take this medicine? They need to know if you have any of these conditions: -blood disorders -history of other chemotherapy -infection -kidney disease -liver disease -recent or ongoing radiation  therapy -tumors in the bone marrow -an unusual or allergic reaction to cyclophosphamide, other chemotherapy, other medicines, foods, dyes, or preservatives -pregnant or trying to get pregnant -breast-feeding How should I use this medicine? This drug is usually given as an injection into a vein or muscle or by infusion into a vein. It is administered in a hospital or clinic by a specially trained health care professional. Talk to your pediatrician regarding the use of this medicine in children. Special care may be needed. Overdosage: If you think you have taken too much of this medicine contact a poison control center or emergency room at once. NOTE: This medicine is only for you. Do not share this medicine with others. What if I miss a dose? It is important not to miss your dose. Call your doctor or health care professional if you are unable to keep an appointment. What may interact with this medicine? This medicine may interact with the following medications: -amiodarone -amphotericin B -azathioprine -certain antiviral medicines for HIV or AIDS such as protease inhibitors (e.g., indinavir, ritonavir) and zidovudine -certain blood pressure medications such as benazepril, captopril, enalapril, fosinopril, lisinopril, moexipril, monopril, perindopril, quinapril, ramipril, trandolapril -certain cancer medications such as anthracyclines (e.g., daunorubicin, doxorubicin), busulfan, cytarabine, paclitaxel, pentostatin, tamoxifen, trastuzumab -certain diuretics such as chlorothiazide, chlorthalidone, hydrochlorothiazide, indapamide, metolazone -certain medicines that treat or prevent blood clots like warfarin -certain muscle relaxants such as succinylcholine -cyclosporine -etanercept -indomethacin -medicines to increase blood counts like filgrastim, pegfilgrastim, sargramostim -medicines used as general anesthesia -metronidazole -natalizumab This list may not describe all possible  interactions. Give your health care provider a list of all the medicines, herbs, non-prescription drugs, or dietary supplements you use. Also tell them if you smoke, drink alcohol, or use illegal drugs. Some items may interact with your medicine. What should I watch for while using this medicine? Visit your doctor for checks on your progress. This drug may make you feel generally unwell. This is not uncommon, as chemotherapy can affect healthy cells as well as cancer cells. Report any side effects. Continue your course of treatment even though you feel ill unless your doctor tells you to stop. Drink water or other fluids as directed. Urinate often, even at night. In some cases, you may be given additional medicines to help with side effects. Follow all directions for their use. Call your doctor or health care professional for advice if you get a fever, chills or sore throat, or other symptoms of a cold or flu. Do not treat yourself. This drug decreases your body's ability to fight infections. Try to avoid being around people who are sick. This medicine may increase your risk to bruise or bleed. Call your doctor or health care professional if you notice any unusual bleeding. Be careful brushing and flossing your teeth or using a toothpick because you may get an infection or bleed more easily. If you have any dental work done, tell your dentist you are receiving this medicine. You may get drowsy or dizzy. Do not drive, use machinery, or do  anything that needs mental alertness until you know how this medicine affects you. Do not become pregnant while taking this medicine or for 1 year after stopping it. Women should inform their doctor if they wish to become pregnant or think they might be pregnant. Men should not father a child while taking this medicine and for 4 months after stopping it. There is a potential for serious side effects to an unborn child. Talk to your health care professional or pharmacist for  more information. Do not breast-feed an infant while taking this medicine. This medicine may interfere with the ability to have a child. This medicine has caused ovarian failure in some women. This medicine has caused reduced sperm counts in some men. You should talk with your doctor or health care professional if you are concerned about your fertility. If you are going to have surgery, tell your doctor or health care professional that you have taken this medicine. What side effects may I notice from receiving this medicine? Side effects that you should report to your doctor or health care professional as soon as possible: -allergic reactions like skin rash, itching or hives, swelling of the face, lips, or tongue -low blood counts - this medicine may decrease the number of white blood cells, red blood cells and platelets. You may be at increased risk for infections and bleeding. -signs of infection - fever or chills, cough, sore throat, pain or difficulty passing urine -signs of decreased platelets or bleeding - bruising, pinpoint red spots on the skin, black, tarry stools, blood in the urine -signs of decreased red blood cells - unusually weak or tired, fainting spells, lightheadedness -breathing problems -dark urine -dizziness -palpitations -swelling of the ankles, feet, hands -trouble passing urine or change in the amount of urine -weight gain -yellowing of the eyes or skin Side effects that usually do not require medical attention (report to your doctor or health care professional if they continue or are bothersome): -changes in nail or skin color -hair loss -missed menstrual periods -mouth sores -nausea, vomiting This list may not describe all possible side effects. Call your doctor for medical advice about side effects. You may report side effects to FDA at 1-800-FDA-1088. Where should I keep my medicine? This drug is given in a hospital or clinic and will not be stored at  home. NOTE: This sheet is a summary. It may not cover all possible information. If you have questions about this medicine, talk to your doctor, pharmacist, or health care provider.  2018 Elsevier/Gold Standard (2012-06-13 16:22:58)   Pegfilgrastim (Neulasta) injection What is this medicine? PEGFILGRASTIM (PEG fil gra stim) is a long-acting granulocyte colony-stimulating factor that stimulates the growth of neutrophils, a type of white blood cell important in the body's fight against infection. It is used to reduce the incidence of fever and infection in patients with certain types of cancer who are receiving chemotherapy that affects the bone marrow, and to increase survival after being exposed to high doses of radiation. This medicine may be used for other purposes; ask your health care provider or pharmacist if you have questions. COMMON BRAND NAME(S): Neulasta What should I tell my health care provider before I take this medicine? They need to know if you have any of these conditions: -kidney disease -latex allergy -ongoing radiation therapy -sickle cell disease -skin reactions to acrylic adhesives (On-Body Injector only) -an unusual or allergic reaction to pegfilgrastim, filgrastim, other medicines, foods, dyes, or preservatives -pregnant or trying to get pregnant -breast-feeding  How should I use this medicine? This medicine is for injection under the skin. If you get this medicine at home, you will be taught how to prepare and give the pre-filled syringe or how to use the On-body Injector. Refer to the patient Instructions for Use for detailed instructions. Use exactly as directed. Tell your healthcare provider immediately if you suspect that the On-body Injector may not have performed as intended or if you suspect the use of the On-body Injector resulted in a missed or partial dose. It is important that you put your used needles and syringes in a special sharps container. Do not put them  in a trash can. If you do not have a sharps container, call your pharmacist or healthcare provider to get one. Talk to your pediatrician regarding the use of this medicine in children. While this drug may be prescribed for selected conditions, precautions do apply. Overdosage: If you think you have taken too much of this medicine contact a poison control center or emergency room at once. NOTE: This medicine is only for you. Do not share this medicine with others. What if I miss a dose? It is important not to miss your dose. Call your doctor or health care professional if you miss your dose. If you miss a dose due to an On-body Injector failure or leakage, a new dose should be administered as soon as possible using a single prefilled syringe for manual use. What may interact with this medicine? Interactions have not been studied. Give your health care provider a list of all the medicines, herbs, non-prescription drugs, or dietary supplements you use. Also tell them if you smoke, drink alcohol, or use illegal drugs. Some items may interact with your medicine. This list may not describe all possible interactions. Give your health care provider a list of all the medicines, herbs, non-prescription drugs, or dietary supplements you use. Also tell them if you smoke, drink alcohol, or use illegal drugs. Some items may interact with your medicine. What should I watch for while using this medicine? You may need blood work done while you are taking this medicine. If you are going to need a MRI, CT scan, or other procedure, tell your doctor that you are using this medicine (On-Body Injector only). What side effects may I notice from receiving this medicine? Side effects that you should report to your doctor or health care professional as soon as possible: -allergic reactions like skin rash, itching or hives, swelling of the face, lips, or tongue -dizziness -fever -pain, redness, or irritation at site where  injected -pinpoint red spots on the skin -red or dark-brown urine -shortness of breath or breathing problems -stomach or side pain, or pain at the shoulder -swelling -tiredness -trouble passing urine or change in the amount of urine Side effects that usually do not require medical attention (report to your doctor or health care professional if they continue or are bothersome): -bone pain -muscle pain This list may not describe all possible side effects. Call your doctor for medical advice about side effects. You may report side effects to FDA at 1-800-FDA-1088. Where should I keep my medicine? Keep out of the reach of children. Store pre-filled syringes in a refrigerator between 2 and 8 degrees C (36 and 46 degrees F). Do not freeze. Keep in carton to protect from light. Throw away this medicine if it is left out of the refrigerator for more than 48 hours. Throw away any unused medicine after the expiration date. NOTE:  This sheet is a summary. It may not cover all possible information. If you have questions about this medicine, talk to your doctor, pharmacist, or health care provider.  2018 Elsevier/Gold Standard (2016-07-26 12:58:03)

## 2017-05-24 NOTE — Assessment & Plan Note (Signed)
04/30/2017: Left lumpectomy: IDC grade 3, 1.7 cm, DCIS, lymphovascular invasion present, margins negative, 0/1 lymph node negative, ER 0%, PR 0%, HER-2 negative ratio 1.37, Ki-67 40%, T1c N0 stage IB  Pathology counseling: I discussed the final pathology report of the patient provided  a copy of this report. I discussed the margins as well as lymph node surgeries. We also discussed the final staging along with previously performed ER/PR and HER-2/neu testing.  Recommendation: 1. adjuvant chemotherapy with dose dense Adriamycin and Cytoxan 4 followed by Taxol weekly 12 ( cannot use steroids because patient is diabetic) 2. Followed by radiation --------------------------------------------------------------------------------------------------------------------------- Current treatment: Cycle 1 day 1 dose dense Adriamycin and Cytoxan Chemotherapy consent obtained Labs reviewed Antiemetics were reviewed Echocardiogram 04/23/2017: EF 55-60%  Return to clinic in one week for toxicity check

## 2017-05-28 NOTE — Progress Notes (Signed)
Patient Care Team: [name], MD as PCP - General (Internal Medicine)  DIAGNOSIS:  Encounter Diagnosis  Name Primary?  . Malignant neoplasm of upper-outer quadrant of left breast in female, estrogen receptor negative ([place])     SUMMARY OF ONCOLOGIC HISTORY:   Malignant neoplasm of upper-outer quadrant of left breast in female, estrogen receptor negative ([place])   04/05/2017 Initial Diagnosis    Left breast asymmetry by ultrasound measured 1.3 cm at 2:30 position 10 cm from nipple, no axillary lymph nodes; biopsy IDC grade 2, ER 0%, PR 0%, HER-2 negative ratio 1.37, Ki-67 40%, T1c N0 stage IB AJCC 8       04/30/2017 Surgery    Left lumpectomy: IDC grade 3, 1.7 cm, DCIS, lymphovascular invasion present, margins negative, 0/1 lymph node negative, ER 0%, PR 0%, HER-2 negative ratio 1.37, Ki-67 40%, T1c N0 stage IB      05/22/2017 Genetic Testing    Patient had genetic testing due to a personal history of triple negative breast cancer.  The Common Hereditary Cancer Panel was ordered. The Hereditary Gene Panel offered by [facility] includes sequencing and/or deletion duplication testing of the following 46 genes: APC, ATM, AXIN2, BARD1, BMPR1A, BRCA1, BRCA2, BRIP1, CDH1, CDKN2A (p14ARF), CDKN2A (p16INK4a), CHEK2, CTNNA1, DICER1, EPCAM (Deletion/duplication testing only), GREM1 (promoter region deletion/duplication testing only), KIT, MEN1, MLH1, MSH2, MSH3, MSH6, MUTYH, NBN, NF1, NHTL1, PALB2, PDGFRA, PMS2, POLD1, POLE, PTEN, RAD50, RAD51C, RAD51D, SDHB, SDHC, SDHD, SMAD4, SMARCA4. STK11, TP53, TSC1, TSC2, and VHL.  The following genes were evaluated for sequence changes only: SDHA and HOXB13 c.251G>A variant only.    Results: No pathogenic mutations identified.  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  The date of this test report is 05/22/2017.       05/24/2017 -  Chemotherapy    Dose dense Adriamycin and Cytoxan 4 followed by Taxol weekly 12        CHIEF COMPLIANT:  cycle 1 day 8 dose dense Adriamycin and Cytoxan  INTERVAL HISTORY: Audrey Peters is a 61-year-old with above-mentioned history of left breast cancer currently undergoing adjuvant chemotherapy with dose dense Adriamycin and Cytoxan. She feels extremely miserable. She has had nausea for several days and hence has not been able to eat or drink a whole lot. She tells me that she is taking Zofran and it gives her relief. She took Ativan and Compazine apparently had palpitations and could not sleep. She does not want to take those medications anymore. She feels extremely wiped out tired.  REVIEW OF SYSTEMS:   Constitutional: Denies fevers, chills or abnormal weight loss Eyes: Denies blurriness of vision Ears, nose, mouth, throat, and face: Denies mucositis or sore throat Respiratory: Denies cough, dyspnea or wheezes Cardiovascular: Denies palpitation, chest discomfort Gastrointestinal:  Denies nausea, heartburn or change in bowel habits Skin: Denies abnormal skin rashes Lymphatics: Denies new lymphadenopathy or easy bruising Neurological:Denies numbness, tingling or new weaknesses Behavioral/Psych: Mood is stable, no new changes  Extremities: No lower extremity edema  All other systems were reviewed with the patient and are negative.  I have reviewed the past medical history, past surgical history, social history and family history with the patient and they are unchanged from previous note.  ALLERGIES:  is allergic to invokana [canagliflozin]; sulfa antibiotics; and ciprofloxacin.  MEDICATIONS:  Current Outpatient Prescriptions  Medication Sig Dispense Refill  . acetaminophen (TYLENOL 8 HOUR ARTHRITIS PAIN) 650 MG CR tablet Take 650 mg by mouth every 8 (eight) hours as needed for pain.    Marland Kitchen  clotrimazole (LOTRIMIN) 1 % cream Apply 1 application topically daily as needed (for irritated/itchy skin.).     Marland Kitchen dexamethasone (DECADRON) 4 MG tablet Take 1 tablet (4 mg total) by mouth daily. Take 1  tablet daily after chemotherapy and one tablet 2 days after chemotherapy with food 8 tablet 0  . Dulaglutide (TRULICITY) 1.5 DU/2.0UR SOPN Inject 1.5 mg into the skin every Tuesday.    . estradiol (ESTRACE) 0.1 MG/GM vaginal cream Place 1 Applicatorful vaginally 2 (two) times a week.    Marland Kitchen ibuprofen (ADVIL,MOTRIN) 800 MG tablet Take 1 tablet (800 mg total) by mouth every 8 (eight) hours as needed. 30 tablet 0  . insulin regular human CONCENTRATED (HUMULIN R U-500 KWIKPEN) 500 UNIT/ML kwikpen Inject 50-120 Units into the skin 3 (three) times daily with meals. 120 units in the morning, 50 units at lunch and 50 units at supper.    . lidocaine-prilocaine (EMLA) cream Apply to affected area once 30 g 3  . LORazepam (ATIVAN) 0.5 MG tablet Take 1 tablet (0.5 mg total) by mouth at bedtime. 30 tablet 0  . losartan (COZAAR) 100 MG tablet Take 100 mg by mouth daily.    . meloxicam (MOBIC) 15 MG tablet Take 15 mg by mouth daily.    . metFORMIN (GLUCOPHAGE) 1000 MG tablet Take 1,000 mg by mouth daily.    . ondansetron (ZOFRAN) 8 MG tablet Take 1 tablet (8 mg total) by mouth 2 (two) times daily as needed. Start on the third day after chemotherapy. 30 tablet 1  . oxyCODONE (OXY IR/ROXICODONE) 5 MG immediate release tablet Take 1-2 tablets (5-10 mg total) by mouth every 6 (six) hours as needed for severe pain. (Patient not taking: Reported on 05/14/2017) 20 tablet 0  . prochlorperazine (COMPAZINE) 10 MG tablet Take 1 tablet (10 mg total) by mouth every 6 (six) hours as needed (Nausea or vomiting). 30 tablet 1  . triamterene-hydrochlorothiazide (MAXZIDE-25) 37.5-25 MG per tablet Take 1 tablet by mouth daily.      No current facility-administered medications for this visit.     PHYSICAL EXAMINATION: ECOG PERFORMANCE STATUS: 1 - Symptomatic but completely ambulatory  Vitals:   05/31/17 0854  BP: (!) 99/58  Pulse: 83  Resp: 18  Temp: 98.1 F (36.7 C)  SpO2: 100%   Filed Weights   05/31/17 0854  Weight: 235  lb 3.2 oz (106.7 kg)    GENERAL:alert, no distress and comfortable SKIN: skin color, texture, turgor are normal, no rashes or significant lesions EYES: normal, Conjunctiva are pink and non-injected, sclera clear OROPHARYNX:no exudate, no erythema and lips, buccal mucosa, and tongue normal  NECK: supple, thyroid normal size, non-tender, without nodularity LYMPH:  no palpable lymphadenopathy in the cervical, axillary or inguinal LUNGS: clear to auscultation and percussion with normal breathing effort HEART: regular rate & rhythm and no murmurs and no lower extremity edema ABDOMEN:abdomen soft, non-tender and normal bowel sounds MUSCULOSKELETAL:no cyanosis of digits and no clubbing  NEURO: alert & oriented x 3 with fluent speech, no focal motor/sensory deficits EXTREMITIES: No lower extremity edema  LABORATORY DATA:  I have reviewed the data as listed   Chemistry      Component Value Date/Time   NA 136 05/31/2017 0837   K 3.9 05/31/2017 0837   CL 105 04/25/2017 0946   CO2 24 05/31/2017 0837   BUN 21.0 05/31/2017 0837   CREATININE 1.0 05/31/2017 0837      Component Value Date/Time   CALCIUM 8.9 05/31/2017 0837   ALKPHOS  80 05/31/2017 0837   AST 27 05/31/2017 0837   ALT 26 05/31/2017 0837   BILITOT 1.13 05/31/2017 0837      Deciliter on the same Lab Results  Component Value Date   WBC 0.9 (LL) 05/31/2017   HGB 11.1 (L) 05/31/2017   HCT 34.5 (L) 05/31/2017   MCV 86.9 05/31/2017   PLT 121 (L) 05/31/2017   NEUTROABS 0.1 (LL) 05/31/2017    ASSESSMENT & PLAN:  Malignant neoplasm of upper-outer quadrant of left breast in female, estrogen receptor negative ([place]) 04/30/2017: Left lumpectomy: IDC grade 3, 1.7 cm, DCIS, lymphovascular invasion present, margins negative, 0/1 lymph node negative, ER 0%, PR 0%, HER-2 negative ratio 1.37, Ki-67 40%, T1c N0 stage IB  Pathology counseling: I discussed the final pathology report of the patient provided  a copy of this report. I  discussed the margins as well as lymph node surgeries. We also discussed the final staging along with previously performed ER/PR and HER-2/neu testing.  Recommendation: 1. adjuvant chemotherapy with dose dense Adriamycin and Cytoxan 4 followed by Taxol weekly 12 ( cannot use steroids because patient is diabetic) 2. Followed by radiation --------------------------------------------------------------------------------------------------------------------------- Current treatment: Cycle 1 day 8 dose dense Adriamycin and Cytoxan Echocardiogram 04/23/2017: EF 55-60%  Chemo Toxicities: 1. Nausea and vomiting 2. Neutropenia: We will decrease dosage with cycle 2 3. Severe fatigue: Patient reports that she is a very busy lady and does not like the fact that she has to rest and not be as active as before.  Return to clinic in one week for cycle 2   I spent 25 minutes talking to the patient of which more than half was spent in counseling and coordination of care.  Orders Placed This Encounter  Procedures  . MM DIAG BREAST TOMO UNI LEFT    Standing Status:   Future    Standing Expiration Date:   05/31/2018    Order Specific Question:   Reason for Exam (SYMPTOM  OR DIAGNOSIS REQUIRED)    Answer:   New left breast lump    Order Specific Question:   Is the patient pregnant?    Answer:   No    Order Specific Question:   Preferred imaging location?    Answer:   [facility]  . US BREAST LTD UNI LEFT INC AXILLA    Standing Status:   Future    Standing Expiration Date:   07/31/2018    Order Specific Question:   Reason for Exam (SYMPTOM  OR DIAGNOSIS REQUIRED)    Answer:   New left breast lump    Order Specific Question:   Preferred imaging location?    Answer:   [facility]   The patient has a good understanding of the overall plan. she agrees with it. she will call with any problems that may develop before the next visit here.   Rulon Eisenmenger, MD 05/31/17

## 2017-05-28 NOTE — Assessment & Plan Note (Signed)
04/30/2017: Left lumpectomy: IDC grade 3, 1.7 cm, DCIS, lymphovascular invasion present, margins negative, 0/1 lymph node negative, ER 0%, PR 0%, HER-2 negative ratio 1.37, Ki-67 40%, T1c N0 stage IB  Pathology counseling: I discussed the final pathology report of the patient provided  a copy of this report. I discussed the margins as well as lymph node surgeries. We also discussed the final staging along with previously performed ER/PR and HER-2/neu testing.  Recommendation: 1. adjuvant chemotherapy with dose dense Adriamycin and Cytoxan 4 followed by Taxol weekly 12 ( cannot use steroids because patient is diabetic) 2. Followed by radiation --------------------------------------------------------------------------------------------------------------------------- Current treatment: Cycle 1 day 8 dose dense Adriamycin and Cytoxan Echocardiogram 04/23/2017: EF 55-60%  Chemo Toxicities:  Return to clinic in one week for cycle 2

## 2017-05-31 ENCOUNTER — Ambulatory Visit (HOSPITAL_BASED_OUTPATIENT_CLINIC_OR_DEPARTMENT_OTHER): Payer: BC Managed Care – PPO | Admitting: Hematology and Oncology

## 2017-05-31 ENCOUNTER — Other Ambulatory Visit (HOSPITAL_BASED_OUTPATIENT_CLINIC_OR_DEPARTMENT_OTHER): Payer: BC Managed Care – PPO

## 2017-05-31 ENCOUNTER — Ambulatory Visit (HOSPITAL_BASED_OUTPATIENT_CLINIC_OR_DEPARTMENT_OTHER): Payer: BC Managed Care – PPO

## 2017-05-31 DIAGNOSIS — E119 Type 2 diabetes mellitus without complications: Secondary | ICD-10-CM | POA: Diagnosis not present

## 2017-05-31 DIAGNOSIS — C50412 Malignant neoplasm of upper-outer quadrant of left female breast: Secondary | ICD-10-CM | POA: Diagnosis not present

## 2017-05-31 DIAGNOSIS — R530 Neoplastic (malignant) related fatigue: Secondary | ICD-10-CM

## 2017-05-31 DIAGNOSIS — D701 Agranulocytosis secondary to cancer chemotherapy: Secondary | ICD-10-CM

## 2017-05-31 DIAGNOSIS — Z171 Estrogen receptor negative status [ER-]: Secondary | ICD-10-CM

## 2017-05-31 DIAGNOSIS — R112 Nausea with vomiting, unspecified: Secondary | ICD-10-CM

## 2017-05-31 DIAGNOSIS — Z95828 Presence of other vascular implants and grafts: Secondary | ICD-10-CM

## 2017-05-31 LAB — CBC WITH DIFFERENTIAL/PLATELET
BASO%: 0.6 % (ref 0.0–2.0)
Basophils Absolute: 0 10*3/uL (ref 0.0–0.1)
EOS ABS: 0 10*3/uL (ref 0.0–0.5)
EOS%: 1.3 % (ref 0.0–7.0)
HCT: 34.5 % — ABNORMAL LOW (ref 34.8–46.6)
HEMOGLOBIN: 11.1 g/dL — AB (ref 11.6–15.9)
LYMPH%: 81.8 % — ABNORMAL HIGH (ref 14.0–49.7)
MCH: 28.1 pg (ref 25.1–34.0)
MCHC: 32.3 g/dL (ref 31.5–36.0)
MCV: 86.9 fL (ref 79.5–101.0)
MONO#: 0 10*3/uL — AB (ref 0.1–0.9)
MONO%: 2.2 % (ref 0.0–14.0)
NEUT%: 14.1 % — ABNORMAL LOW (ref 38.4–76.8)
NEUTROS ABS: 0.1 10*3/uL — AB (ref 1.5–6.5)
PLATELETS: 121 10*3/uL — AB (ref 145–400)
RBC: 3.97 10*6/uL (ref 3.70–5.45)
RDW: 13 % (ref 11.2–14.5)
WBC: 0.9 10*3/uL — AB (ref 3.9–10.3)
lymph#: 0.7 10*3/uL — ABNORMAL LOW (ref 0.9–3.3)

## 2017-05-31 LAB — COMPREHENSIVE METABOLIC PANEL
ALK PHOS: 80 U/L (ref 40–150)
ALT: 26 U/L (ref 0–55)
ANION GAP: 11 meq/L (ref 3–11)
AST: 27 U/L (ref 5–34)
Albumin: 3.4 g/dL — ABNORMAL LOW (ref 3.5–5.0)
BILIRUBIN TOTAL: 1.13 mg/dL (ref 0.20–1.20)
BUN: 21 mg/dL (ref 7.0–26.0)
CALCIUM: 8.9 mg/dL (ref 8.4–10.4)
CHLORIDE: 102 meq/L (ref 98–109)
CO2: 24 mEq/L (ref 22–29)
CREATININE: 1 mg/dL (ref 0.6–1.1)
EGFR: >60 mL/min/{1.73_m2} (ref >60)
Glucose: 282 mg/dl — ABNORMAL HIGH (ref 70–140)
Potassium: 3.9 mEq/L (ref 3.5–5.1)
Sodium: 136 mEq/L (ref 136–145)
Total Protein: 7 g/dL (ref 6.4–8.3)

## 2017-05-31 MED ORDER — HEPARIN SOD (PORK) LOCK FLUSH 100 UNIT/ML IV SOLN
500.0000 [IU] | Freq: Once | INTRAVENOUS | Status: AC | PRN
  Administered 2017-05-31: 500 [IU] via INTRAVENOUS
  Filled 2017-05-31: qty 5

## 2017-05-31 MED ORDER — SODIUM CHLORIDE 0.9% FLUSH
10.0000 mL | INTRAVENOUS | Status: DC | PRN
  Administered 2017-05-31: 10 mL via INTRAVENOUS
  Filled 2017-05-31: qty 10

## 2017-06-04 ENCOUNTER — Telehealth: Payer: Self-pay | Admitting: Hematology and Oncology

## 2017-06-04 NOTE — Telephone Encounter (Signed)
Scheduled appts per 10/23 los. Patient will get an updated schedule on 10/26.

## 2017-06-06 ENCOUNTER — Ambulatory Visit
Admission: RE | Admit: 2017-06-06 | Discharge: 2017-06-06 | Disposition: A | Discharge status: Home / Self Care | Payer: BC Managed Care – PPO | Source: Ambulatory Visit | Attending: Hematology and Oncology | Admitting: Hematology and Oncology

## 2017-06-06 ENCOUNTER — Other Ambulatory Visit: Payer: Self-pay | Admitting: Hematology and Oncology

## 2017-06-06 DIAGNOSIS — Z171 Estrogen receptor negative status [ER-]: Principal | ICD-10-CM

## 2017-06-06 DIAGNOSIS — C50412 Malignant neoplasm of upper-outer quadrant of left female breast: Secondary | ICD-10-CM

## 2017-06-06 DIAGNOSIS — N6320 Unspecified lump in the left breast, unspecified quadrant: Secondary | ICD-10-CM

## 2017-06-06 HISTORY — DX: Personal history of antineoplastic chemotherapy: Z92.21

## 2017-06-07 ENCOUNTER — Other Ambulatory Visit (HOSPITAL_BASED_OUTPATIENT_CLINIC_OR_DEPARTMENT_OTHER): Payer: BC Managed Care – PPO

## 2017-06-07 ENCOUNTER — Encounter: Payer: Self-pay | Admitting: *Deleted

## 2017-06-07 ENCOUNTER — Encounter: Payer: Self-pay | Admitting: Oncology

## 2017-06-07 ENCOUNTER — Ambulatory Visit (HOSPITAL_BASED_OUTPATIENT_CLINIC_OR_DEPARTMENT_OTHER): Payer: BC Managed Care – PPO

## 2017-06-07 ENCOUNTER — Ambulatory Visit (HOSPITAL_BASED_OUTPATIENT_CLINIC_OR_DEPARTMENT_OTHER): Payer: BC Managed Care – PPO | Admitting: Oncology

## 2017-06-07 ENCOUNTER — Ambulatory Visit: Payer: BC Managed Care – PPO

## 2017-06-07 VITALS — BP 126/68 | HR 82 | Temp 98.0°F | Resp 20 | Ht 65.0 in | Wt 237.2 lb

## 2017-06-07 DIAGNOSIS — R530 Neoplastic (malignant) related fatigue: Secondary | ICD-10-CM | POA: Diagnosis not present

## 2017-06-07 DIAGNOSIS — C50412 Malignant neoplasm of upper-outer quadrant of left female breast: Secondary | ICD-10-CM

## 2017-06-07 DIAGNOSIS — E119 Type 2 diabetes mellitus without complications: Secondary | ICD-10-CM

## 2017-06-07 DIAGNOSIS — L659 Nonscarring hair loss, unspecified: Secondary | ICD-10-CM

## 2017-06-07 DIAGNOSIS — D701 Agranulocytosis secondary to cancer chemotherapy: Secondary | ICD-10-CM

## 2017-06-07 DIAGNOSIS — Z171 Estrogen receptor negative status [ER-]: Secondary | ICD-10-CM | POA: Diagnosis not present

## 2017-06-07 DIAGNOSIS — Z5111 Encounter for antineoplastic chemotherapy: Secondary | ICD-10-CM | POA: Diagnosis not present

## 2017-06-07 DIAGNOSIS — Z95828 Presence of other vascular implants and grafts: Secondary | ICD-10-CM

## 2017-06-07 LAB — CBC WITH DIFFERENTIAL/PLATELET
BASO%: 0.1 % (ref 0.0–2.0)
Basophils Absolute: 0 10*3/uL (ref 0.0–0.1)
EOS%: 0 % (ref 0.0–7.0)
Eosinophils Absolute: 0 10*3/uL (ref 0.0–0.5)
HCT: 33.3 % — ABNORMAL LOW (ref 34.8–46.6)
HGB: 10.6 g/dL — ABNORMAL LOW (ref 11.6–15.9)
LYMPH%: 18.9 % (ref 14.0–49.7)
MCH: 27.8 pg (ref 25.1–34.0)
MCHC: 31.8 g/dL (ref 31.5–36.0)
MCV: 87.4 fL (ref 79.5–101.0)
MONO#: 1.2 10*3/uL — ABNORMAL HIGH (ref 0.1–0.9)
MONO%: 13.6 % (ref 0.0–14.0)
NEUT#: 5.7 10*3/uL (ref 1.5–6.5)
NEUT%: 67.4 % (ref 38.4–76.8)
Platelets: 138 10*3/uL — ABNORMAL LOW (ref 145–400)
RBC: 3.81 10*6/uL (ref 3.70–5.45)
RDW: 12.8 % (ref 11.2–14.5)
WBC: 8.5 10*3/uL (ref 3.9–10.3)
lymph#: 1.6 10*3/uL (ref 0.9–3.3)

## 2017-06-07 LAB — COMPREHENSIVE METABOLIC PANEL
ALT: 34 U/L (ref 0–55)
AST: 48 U/L — ABNORMAL HIGH (ref 5–34)
Albumin: 3.3 g/dL — ABNORMAL LOW (ref 3.5–5.0)
Alkaline Phosphatase: 89 U/L (ref 40–150)
Anion Gap: 11 mEq/L (ref 3–11)
BUN: 17.5 mg/dL (ref 7.0–26.0)
CO2: 24 mEq/L (ref 22–29)
Calcium: 8.6 mg/dL (ref 8.4–10.4)
Chloride: 104 mEq/L (ref 98–109)
Creatinine: 1.1 mg/dL (ref 0.6–1.1)
EGFR: >60 mL/min/{1.73_m2} (ref >60)
Glucose: 247 mg/dl — ABNORMAL HIGH (ref 70–140)
Potassium: 3.4 mEq/L — ABNORMAL LOW (ref 3.5–5.1)
Sodium: 138 mEq/L (ref 136–145)
Total Bilirubin: 0.28 mg/dL (ref 0.20–1.20)
Total Protein: 6.9 g/dL (ref 6.4–8.3)

## 2017-06-07 MED ORDER — HEPARIN SOD (PORK) LOCK FLUSH 100 UNIT/ML IV SOLN
500.0000 [IU] | Freq: Once | INTRAVENOUS | Status: AC | PRN
  Administered 2017-06-07: 500 [IU]
  Filled 2017-06-07: qty 5

## 2017-06-07 MED ORDER — SODIUM CHLORIDE 0.9% FLUSH
10.0000 mL | INTRAVENOUS | Status: DC | PRN
  Administered 2017-06-07: 10 mL via INTRAVENOUS
  Filled 2017-06-07: qty 10

## 2017-06-07 MED ORDER — SODIUM CHLORIDE 0.9 % IV SOLN
Freq: Once | INTRAVENOUS | Status: AC
  Administered 2017-06-07: 12:00:00 via INTRAVENOUS
  Filled 2017-06-07: qty 5

## 2017-06-07 MED ORDER — PALONOSETRON HCL INJECTION 0.25 MG/5ML
INTRAVENOUS | Status: AC
  Filled 2017-06-07: qty 5

## 2017-06-07 MED ORDER — PALONOSETRON HCL INJECTION 0.25 MG/5ML
0.2500 mg | Freq: Once | INTRAVENOUS | Status: AC
  Administered 2017-06-07: 0.25 mg via INTRAVENOUS

## 2017-06-07 MED ORDER — PEGFILGRASTIM 6 MG/0.6ML ~~LOC~~ PSKT
6.0000 mg | PREFILLED_SYRINGE | Freq: Once | SUBCUTANEOUS | Status: AC
  Administered 2017-06-07: 6 mg via SUBCUTANEOUS
  Filled 2017-06-07: qty 0.6

## 2017-06-07 MED ORDER — SODIUM CHLORIDE 0.9 % IV SOLN
500.0000 mg/m2 | Freq: Once | INTRAVENOUS | Status: AC
  Administered 2017-06-07: 1120 mg via INTRAVENOUS
  Filled 2017-06-07: qty 56

## 2017-06-07 MED ORDER — SODIUM CHLORIDE 0.9 % IV SOLN
Freq: Once | INTRAVENOUS | Status: AC
  Administered 2017-06-07: 12:00:00 via INTRAVENOUS

## 2017-06-07 MED ORDER — DOXORUBICIN HCL CHEMO IV INJECTION 2 MG/ML
50.0000 mg/m2 | Freq: Once | INTRAVENOUS | Status: AC
  Administered 2017-06-07: 112 mg via INTRAVENOUS
  Filled 2017-06-07: qty 56

## 2017-06-07 MED ORDER — SODIUM CHLORIDE 0.9% FLUSH
10.0000 mL | INTRAVENOUS | Status: DC | PRN
  Administered 2017-06-07: 10 mL
  Filled 2017-06-07: qty 10

## 2017-06-07 NOTE — Patient Instructions (Signed)

## 2017-06-07 NOTE — Assessment & Plan Note (Addendum)
04/30/2017: Left lumpectomy: IDC grade 3, 1.7 cm, DCIS, lymphovascular invasion present, margins negative, 0/1 lymph node negative, ER 0%, PR 0%, HER-2 negative ratio 1.37, Ki-67 40%, T1c N0 stage IB  Pathology counseling: I discussed the final pathology report of the patient provided  a copy of this report. I discussed the margins as well as lymph node surgeries. We also discussed the final staging along with previously performed ER/PR and HER-2/neu testing.  Recommendation: 1. adjuvant chemotherapy with dose dense Adriamycin and Cytoxan 4 followed by Taxol weekly 12 ( cannot use steroids because patient is diabetic) 2. Followed by radiation --------------------------------------------------------------------------------------------------------------------------- Current treatment: Cycle 2 day 1 dose dense Adriamycin and Cytoxan Echocardiogram 04/23/2017: EF 55-60%  Chemo Toxicities: 1. Nausea and vomiting 2. Neutropenia: We will decrease dosage with cycle 2. Receiving OnPro 3. Severe fatigue: Patient reports that she is a very busy lady and does not like the fact that she has to rest and not be as active as before.  Return to clinic in 2 weeks for cycle 3 She was given a prescription for cranial prosthesis today.

## 2017-06-07 NOTE — Patient Instructions (Signed)
[facility] Discharge Instructions for Patients Receiving Chemotherapy  Today you received the following chemotherapy agents Adriamycin and Cytoxan  To help prevent nausea and vomiting after your treatment, we encourage you to take your nausea medication as directed   If you develop nausea and vomiting that is not controlled by your nausea medication, call the clinic.   BELOW ARE SYMPTOMS THAT SHOULD BE REPORTED IMMEDIATELY:  *FEVER GREATER THAN 100.5 F  *CHILLS WITH OR WITHOUT FEVER  NAUSEA AND VOMITING THAT IS NOT CONTROLLED WITH YOUR NAUSEA MEDICATION  *UNUSUAL SHORTNESS OF BREATH  *UNUSUAL BRUISING OR BLEEDING  TENDERNESS IN MOUTH AND THROAT WITH OR WITHOUT PRESENCE OF ULCERS  *URINARY PROBLEMS  *BOWEL PROBLEMS  UNUSUAL RASH Items with * indicate a potential emergency and should be followed up as soon as possible.  Feel free to call the clinic should you have any questions or concerns. The clinic phone number is [phone].  Please show the [place] at check-in to the Emergency Department and triage nurse.   Doxorubicin  (Adriamycin) injection What is this medicine? DOXORUBICIN (dox oh ROO bi sin) is a chemotherapy drug. It is used to treat many kinds of cancer like leukemia, lymphoma, neuroblastoma, sarcoma, and Wilms' tumor. It is also used to treat bladder cancer, breast cancer, lung cancer, ovarian cancer, stomach cancer, and thyroid cancer. This medicine may be used for other purposes; ask your health care provider or pharmacist if you have questions. COMMON BRAND NAME(S): Adriamycin, Adriamycin PFS, Adriamycin RDF, Rubex What should I tell my health care provider before I take this medicine? They need to know if you have any of these conditions: -heart disease -history of low blood counts caused by a medicine -liver disease -recent or ongoing radiation therapy -an unusual or allergic reaction to doxorubicin, other chemotherapy agents,  other medicines, foods, dyes, or preservatives -pregnant or trying to get pregnant -breast-feeding How should I use this medicine? This drug is given as an infusion into a vein. It is administered in a hospital or clinic by a specially trained health care professional. If you have pain, swelling, burning or any unusual feeling around the site of your injection, tell your health care professional right away. Talk to your pediatrician regarding the use of this medicine in children. Special care may be needed. Overdosage: If you think you have taken too much of this medicine contact a poison control center or emergency room at once. NOTE: This medicine is only for you. Do not share this medicine with others. What if I miss a dose? It is important not to miss your dose. Call your doctor or health care professional if you are unable to keep an appointment. What may interact with this medicine? This medicine may interact with the following medications: -6-mercaptopurine -paclitaxel -phenytoin -St. John's Wort -trastuzumab -verapamil This list may not describe all possible interactions. Give your health care provider a list of all the medicines, herbs, non-prescription drugs, or dietary supplements you use. Also tell them if you smoke, drink alcohol, or use illegal drugs. Some items may interact with your medicine. What should I watch for while using this medicine? This drug may make you feel generally unwell. This is not uncommon, as chemotherapy can affect healthy cells as well as cancer cells. Report any side effects. Continue your course of treatment even though you feel ill unless your doctor tells you to stop. There is a maximum amount of this medicine you should receive throughout your life. The  amount depends on the medical condition being treated and your overall health. Your doctor will watch how much of this medicine you receive in your lifetime. Tell your doctor if you have taken this  medicine before. You may need blood work done while you are taking this medicine. Your urine may turn red for a few days after your dose. This is not blood. If your urine is dark or brown, call your doctor. In some cases, you may be given additional medicines to help with side effects. Follow all directions for their use. Call your doctor or health care professional for advice if you get a fever, chills or sore throat, or other symptoms of a cold or flu. Do not treat yourself. This drug decreases your body's ability to fight infections. Try to avoid being around people who are sick. This medicine may increase your risk to bruise or bleed. Call your doctor or health care professional if you notice any unusual bleeding. Talk to your doctor about your risk of cancer. You may be more at risk for certain types of cancers if you take this medicine. Do not become pregnant while taking this medicine or for 6 months after stopping it. Women should inform their doctor if they wish to become pregnant or think they might be pregnant. Men should not father a child while taking this medicine and for 6 months after stopping it. There is a potential for serious side effects to an unborn child. Talk to your health care professional or pharmacist for more information. Do not breast-feed an infant while taking this medicine. This medicine has caused ovarian failure in some women and reduced sperm counts in some men This medicine may interfere with the ability to have a child. Talk with your doctor or health care professional if you are concerned about your fertility. What side effects may I notice from receiving this medicine? Side effects that you should report to your doctor or health care professional as soon as possible: -allergic reactions like skin rash, itching or hives, swelling of the face, lips, or tongue -breathing problems -chest pain -fast or irregular heartbeat -low blood counts - this medicine may  decrease the number of white blood cells, red blood cells and platelets. You may be at increased risk for infections and bleeding. -pain, redness, or irritation at site where injected -signs of infection - fever or chills, cough, sore throat, pain or difficulty passing urine -signs of decreased platelets or bleeding - bruising, pinpoint red spots on the skin, black, tarry stools, blood in the urine -swelling of the ankles, feet, hands -tiredness -weakness Side effects that usually do not require medical attention (report to your doctor or health care professional if they continue or are bothersome): -diarrhea -hair loss -mouth sores -nail discoloration or damage -nausea -red colored urine -vomiting This list may not describe all possible side effects. Call your doctor for medical advice about side effects. You may report side effects to FDA at 1-800-FDA-1088. Where should I keep my medicine? This drug is given in a hospital or clinic and will not be stored at home. NOTE: This sheet is a summary. It may not cover all possible information. If you have questions about this medicine, talk to your doctor, pharmacist, or health care provider.  2018 Elsevier/Gold Standard (2015-09-26 11:28:51)   Cyclophosphamide (Cytoxan) injection What is this medicine? CYCLOPHOSPHAMIDE (sye kloe FOSS fa mide) is a chemotherapy drug. It slows the growth of cancer cells. This medicine is used to treat many  types of cancer like lymphoma, myeloma, leukemia, breast cancer, and ovarian cancer, to name a few. This medicine may be used for other purposes; ask your health care provider or pharmacist if you have questions. COMMON BRAND NAME(S): Cytoxan, Neosar What should I tell my health care provider before I take this medicine? They need to know if you have any of these conditions: -blood disorders -history of other chemotherapy -infection -kidney disease -liver disease -recent or ongoing radiation  therapy -tumors in the bone marrow -an unusual or allergic reaction to cyclophosphamide, other chemotherapy, other medicines, foods, dyes, or preservatives -pregnant or trying to get pregnant -breast-feeding How should I use this medicine? This drug is usually given as an injection into a vein or muscle or by infusion into a vein. It is administered in a hospital or clinic by a specially trained health care professional. Talk to your pediatrician regarding the use of this medicine in children. Special care may be needed. Overdosage: If you think you have taken too much of this medicine contact a poison control center or emergency room at once. NOTE: This medicine is only for you. Do not share this medicine with others. What if I miss a dose? It is important not to miss your dose. Call your doctor or health care professional if you are unable to keep an appointment. What may interact with this medicine? This medicine may interact with the following medications: -amiodarone -amphotericin B -azathioprine -certain antiviral medicines for HIV or AIDS such as protease inhibitors (e.g., indinavir, ritonavir) and zidovudine -certain blood pressure medications such as benazepril, captopril, enalapril, fosinopril, lisinopril, moexipril, monopril, perindopril, quinapril, ramipril, trandolapril -certain cancer medications such as anthracyclines (e.g., daunorubicin, doxorubicin), busulfan, cytarabine, paclitaxel, pentostatin, tamoxifen, trastuzumab -certain diuretics such as chlorothiazide, chlorthalidone, hydrochlorothiazide, indapamide, metolazone -certain medicines that treat or prevent blood clots like warfarin -certain muscle relaxants such as succinylcholine -cyclosporine -etanercept -indomethacin -medicines to increase blood counts like filgrastim, pegfilgrastim, sargramostim -medicines used as general anesthesia -metronidazole -natalizumab This list may not describe all possible  interactions. Give your health care provider a list of all the medicines, herbs, non-prescription drugs, or dietary supplements you use. Also tell them if you smoke, drink alcohol, or use illegal drugs. Some items may interact with your medicine. What should I watch for while using this medicine? Visit your doctor for checks on your progress. This drug may make you feel generally unwell. This is not uncommon, as chemotherapy can affect healthy cells as well as cancer cells. Report any side effects. Continue your course of treatment even though you feel ill unless your doctor tells you to stop. Drink water or other fluids as directed. Urinate often, even at night. In some cases, you may be given additional medicines to help with side effects. Follow all directions for their use. Call your doctor or health care professional for advice if you get a fever, chills or sore throat, or other symptoms of a cold or flu. Do not treat yourself. This drug decreases your body's ability to fight infections. Try to avoid being around people who are sick. This medicine may increase your risk to bruise or bleed. Call your doctor or health care professional if you notice any unusual bleeding. Be careful brushing and flossing your teeth or using a toothpick because you may get an infection or bleed more easily. If you have any dental work done, tell your dentist you are receiving this medicine. You may get drowsy or dizzy. Do not drive, use machinery, or do  anything that needs mental alertness until you know how this medicine affects you. Do not become pregnant while taking this medicine or for 1 year after stopping it. Women should inform their doctor if they wish to become pregnant or think they might be pregnant. Men should not father a child while taking this medicine and for 4 months after stopping it. There is a potential for serious side effects to an unborn child. Talk to your health care professional or pharmacist for  more information. Do not breast-feed an infant while taking this medicine. This medicine may interfere with the ability to have a child. This medicine has caused ovarian failure in some women. This medicine has caused reduced sperm counts in some men. You should talk with your doctor or health care professional if you are concerned about your fertility. If you are going to have surgery, tell your doctor or health care professional that you have taken this medicine. What side effects may I notice from receiving this medicine? Side effects that you should report to your doctor or health care professional as soon as possible: -allergic reactions like skin rash, itching or hives, swelling of the face, lips, or tongue -low blood counts - this medicine may decrease the number of white blood cells, red blood cells and platelets. You may be at increased risk for infections and bleeding. -signs of infection - fever or chills, cough, sore throat, pain or difficulty passing urine -signs of decreased platelets or bleeding - bruising, pinpoint red spots on the skin, black, tarry stools, blood in the urine -signs of decreased red blood cells - unusually weak or tired, fainting spells, lightheadedness -breathing problems -dark urine -dizziness -palpitations -swelling of the ankles, feet, hands -trouble passing urine or change in the amount of urine -weight gain -yellowing of the eyes or skin Side effects that usually do not require medical attention (report to your doctor or health care professional if they continue or are bothersome): -changes in nail or skin color -hair loss -missed menstrual periods -mouth sores -nausea, vomiting This list may not describe all possible side effects. Call your doctor for medical advice about side effects. You may report side effects to FDA at 1-800-FDA-1088. Where should I keep my medicine? This drug is given in a hospital or clinic and will not be stored at  home. NOTE: This sheet is a summary. It may not cover all possible information. If you have questions about this medicine, talk to your doctor, pharmacist, or health care provider.  2018 Elsevier/Gold Standard (2012-06-13 16:22:58)   Pegfilgrastim (Neulasta) injection What is this medicine? PEGFILGRASTIM (PEG fil gra stim) is a long-acting granulocyte colony-stimulating factor that stimulates the growth of neutrophils, a type of white blood cell important in the body's fight against infection. It is used to reduce the incidence of fever and infection in patients with certain types of cancer who are receiving chemotherapy that affects the bone marrow, and to increase survival after being exposed to high doses of radiation. This medicine may be used for other purposes; ask your health care provider or pharmacist if you have questions. COMMON BRAND NAME(S): Neulasta What should I tell my health care provider before I take this medicine? They need to know if you have any of these conditions: -kidney disease -latex allergy -ongoing radiation therapy -sickle cell disease -skin reactions to acrylic adhesives (On-Body Injector only) -an unusual or allergic reaction to pegfilgrastim, filgrastim, other medicines, foods, dyes, or preservatives -pregnant or trying to get pregnant -breast-feeding  How should I use this medicine? This medicine is for injection under the skin. If you get this medicine at home, you will be taught how to prepare and give the pre-filled syringe or how to use the On-body Injector. Refer to the patient Instructions for Use for detailed instructions. Use exactly as directed. Tell your healthcare provider immediately if you suspect that the On-body Injector may not have performed as intended or if you suspect the use of the On-body Injector resulted in a missed or partial dose. It is important that you put your used needles and syringes in a special sharps container. Do not put them  in a trash can. If you do not have a sharps container, call your pharmacist or healthcare provider to get one. Talk to your pediatrician regarding the use of this medicine in children. While this drug may be prescribed for selected conditions, precautions do apply. Overdosage: If you think you have taken too much of this medicine contact a poison control center or emergency room at once. NOTE: This medicine is only for you. Do not share this medicine with others. What if I miss a dose? It is important not to miss your dose. Call your doctor or health care professional if you miss your dose. If you miss a dose due to an On-body Injector failure or leakage, a new dose should be administered as soon as possible using a single prefilled syringe for manual use. What may interact with this medicine? Interactions have not been studied. Give your health care provider a list of all the medicines, herbs, non-prescription drugs, or dietary supplements you use. Also tell them if you smoke, drink alcohol, or use illegal drugs. Some items may interact with your medicine. This list may not describe all possible interactions. Give your health care provider a list of all the medicines, herbs, non-prescription drugs, or dietary supplements you use. Also tell them if you smoke, drink alcohol, or use illegal drugs. Some items may interact with your medicine. What should I watch for while using this medicine? You may need blood work done while you are taking this medicine. If you are going to need a MRI, CT scan, or other procedure, tell your doctor that you are using this medicine (On-Body Injector only). What side effects may I notice from receiving this medicine? Side effects that you should report to your doctor or health care professional as soon as possible: -allergic reactions like skin rash, itching or hives, swelling of the face, lips, or tongue -dizziness -fever -pain, redness, or irritation at site where  injected -pinpoint red spots on the skin -red or dark-brown urine -shortness of breath or breathing problems -stomach or side pain, or pain at the shoulder -swelling -tiredness -trouble passing urine or change in the amount of urine Side effects that usually do not require medical attention (report to your doctor or health care professional if they continue or are bothersome): -bone pain -muscle pain This list may not describe all possible side effects. Call your doctor for medical advice about side effects. You may report side effects to FDA at 1-800-FDA-1088. Where should I keep my medicine? Keep out of the reach of children. Store pre-filled syringes in a refrigerator between 2 and 8 degrees C (36 and 46 degrees F). Do not freeze. Keep in carton to protect from light. Throw away this medicine if it is left out of the refrigerator for more than 48 hours. Throw away any unused medicine after the expiration date. NOTE:  This sheet is a summary. It may not cover all possible information. If you have questions about this medicine, talk to your doctor, pharmacist, or health care provider.  2018 Elsevier/Gold Standard (2016-07-26 12:58:03)

## 2017-06-07 NOTE — Progress Notes (Signed)
Audrey Peters Follow up:    Audrey Carol, MD [address] Audrey Peters [address]   DIAGNOSIS: Peters Staging Malignant neoplasm of upper-outer quadrant of left breast in female, estrogen receptor negative (Audrey Peters) Staging form: Breast, AJCC 8th Edition - Clinical: No stage assigned - Unsigned - Pathologic: Stage IB (pT1c, pN0, cM0, G3, ER: Negative, PR: Negative, HER2: Negative) - Signed by Eppie Gibson, MD on 05/14/2017   SUMMARY OF ONCOLOGIC HISTORY:   Malignant neoplasm of upper-outer quadrant of left breast in female, estrogen receptor negative (Audrey Peters)   04/05/2017 Initial Diagnosis    Left breast asymmetry by ultrasound measured 1.3 cm at 2:30 position 10 cm from nipple, no axillary lymph nodes; biopsy IDC grade 2, ER 0%, PR 0%, HER-2 negative ratio 1.37, Ki-67 40%, T1c N0 stage IB AJCC 8       04/30/2017 Surgery    Left lumpectomy: IDC grade 3, 1.7 cm, DCIS, lymphovascular invasion present, margins negative, 0/1 lymph node negative, ER 0%, PR 0%, HER-2 negative ratio 1.37, Ki-67 40%, T1c N0 stage IB      05/22/2017 Genetic Testing    Patient had genetic testing due to a 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  The Common Hereditary Peters Panel was ordered. The Hereditary Gene Panel offered by [facility] includes sequencing and/or deletion duplication testing of the following 46 genes: APC, ATM, AXIN2, BARD1, BMPR1A, BRCA1, BRCA2, BRIP1, CDH1, CDKN2A (p14ARF), CDKN2A (p16INK4a), CHEK2, CTNNA1, DICER1, EPCAM (Deletion/duplication testing only), GREM1 (promoter region deletion/duplication testing only), KIT, MEN1, MLH1, MSH2, MSH3, MSH6, MUTYH, NBN, NF1, NHTL1, PALB2, PDGFRA, PMS2, POLD1, POLE, PTEN, RAD50, RAD51C, RAD51D, SDHB, SDHC, SDHD, SMAD4, SMARCA4. STK11, TP53, TSC1, TSC2, and VHL.  The following genes were evaluated for sequence changes only: SDHA and HOXB13 c.251G>A variant only.    Results: No pathogenic mutations identified.  A VUS in  ATM c.4279G>A (p.Ala1427Thr) was identified.  The date of this test report is 05/22/2017.       05/24/2017 -  Chemotherapy    Dose dense Adriamycin and Cytoxan 4 followed by Taxol weekly 12        CURRENT THERAPY: Cycle 2 day 1 dose dense Adriamycin and Cytoxan  INTERVAL HISTORY: Audrey Peters 60 y.o. female returns for a routine follow-up visit. She is currently undergoing adjuvant chemotherapy with dose dense Adriamycin and Cytoxan. She was seen at the breast center earlier this week and had an ultrasound of her left breast. There is an indeterminate 2.1 cm mass in the lower outer periareolar left breast at the 9:30 o'clock position 1 cm from the nipple. The patient is scheduled to undergo a biopsy next week. The patient feels fair today. She does have some ongoing fatigue. She denies fevers and chills. Denies chest pain, shortness breath, cough, hemoptysis. Denies nausea and vomiting. Denies constipation and diarrhea. The patient requested a prescription for a wig. The patient is here for evaluation prior to cycle 2 day 1 of her chemotherapy.   Patient Active Problem List   Diagnosis Date Noted  . Encounter for antineoplastic chemotherapy 06/07/2017  . Port-A-Cath in place 05/24/2017  . Genetic testing 05/23/2017  . Malignant neoplasm of upper-outer quadrant of left breast in female, estrogen receptor negative (Audrey Peters) 04/22/2017  . Essential hypertension 01/30/2015  . Diabetes mellitus ([place]) 01/30/2015    is allergic to invokana [canagliflozin]; sulfa antibiotics; and ciprofloxacin.  MEDICAL HISTORY: Past Medical History:  Diagnosis Date  . Anemia yrs ago  . Arthritis   .  Breast Peters ([place])   . Peters [facility])    recent dx in breast  . Carpal tunnel syndrome of right wrist   . Diabetes mellitus without complication ([place])    dx 2008  . Headache    sinus  . Hypertension   . Personal history of chemotherapy   . Sleep apnea    does not use cpap  . Vaginal delivery 1983     SURGICAL HISTORY: Past Surgical History:  Procedure Laterality Date  . BREAST LUMPECTOMY Left   . BREAST LUMPECTOMY WITH RADIOACTIVE SEED AND SENTINEL LYMPH NODE BIOPSY Left 04/30/2017   Procedure: LEFT BREAST LUMPECTOMY WITH RADIOACTIVE SEED AND LEFT SENTINEL LYMPH NODE BIOPSY ERAS PATHWAY;  Surgeon: Erroll Luna, MD;  Location: [facility];  Service: General;  Laterality: Left;  . COLONOSCOPY WITH PROPOFOL N/A 05/28/2016   Procedure: COLONOSCOPY WITH PROPOFOL;  Surgeon: Garlan Fair, MD;  Location: [facility];  Service: Endoscopy;  Laterality: N/A;  . DILATION AND CURETTAGE OF UTERUS    . HYSTEROSCOPY W/D&C N/A 07/21/2015   Procedure: DILATATION AND CURETTAGE /HYSTEROSCOPY with myosure;  Surgeon: Janyth Pupa, DO;  Location: [facility];  Service: Gynecology;  Laterality: N/A;  . PORTACATH PLACEMENT Right 04/30/2017   Procedure: INSERTION PORT-A-CATH;  Surgeon: Erroll Luna, MD;  Location: [facility];  Service: General;  Laterality: Right;    SOCIAL HISTORY: Social History   Social History  . Marital status: Married    Spouse name: N/A  . Number of children: N/A  . Years of education: N/A   Occupational History  . Not on file.   Social History Main Topics  . Smoking status: Never Smoker  . Smokeless tobacco: Never Used  . Alcohol use 0.0 oz/week     Comment: occ wine  . Drug use: No  . Sexual activity: Not on file   Other Topics Concern  . Not on file   Social History Narrative  . No narrative on file    FAMILY HISTORY: Family History  Problem Relation Age of Onset  . Diabetes Mother   . Hypertension Mother     Review of Systems  Constitutional: Positive for fatigue. Negative for appetite change, chills and fever.  HENT:  Negative.   Eyes: Negative.   Respiratory: Negative.   Cardiovascular: Negative.   Gastrointestinal: Negative.   Genitourinary: Negative.    Musculoskeletal: Negative.   Skin: Negative.   Neurological: Negative.   Hematological:  Negative.   Psychiatric/Behavioral: Negative.       PHYSICAL EXAMINATION  ECOG PERFORMANCE STATUS: 1 - Symptomatic but completely ambulatory  Vitals:   06/07/17 1039  BP: 126/68  Pulse: 82  Resp: 20  Temp: 98 F (36.7 C)  SpO2: 99%    Physical Exam  Constitutional: She is oriented to person, place, and time and well-developed, well-nourished, and in no distress. No distress.  HENT:  Head: Normocephalic and atraumatic.  Mouth/Throat: Oropharynx is clear and moist. No oropharyngeal exudate.  Eyes: Conjunctivae are normal. Right eye exhibits no discharge. Left eye exhibits no discharge. No scleral icterus.  Neck: Normal range of motion. Neck supple.  Cardiovascular: Normal rate, regular rhythm, normal heart sounds and intact distal pulses.   Pulmonary/Chest: Effort normal and breath sounds normal. No respiratory distress. She has no wheezes. She has no rales.  Abdominal: Soft. Bowel sounds are normal. She exhibits no distension and no mass. There is no tenderness.  Musculoskeletal: Normal range of motion. She exhibits no edema.  Lymphadenopathy:    She has  no cervical adenopathy.  Neurological: She is alert and oriented to person, place, and time. She exhibits normal muscle tone. Gait normal. Coordination normal.  Skin: Skin is warm and dry. No rash noted. She is not diaphoretic. No erythema. No pallor.  Psychiatric: Mood, memory and judgment normal.  Vitals reviewed.   LABORATORY DATA:  CBC    Component Value Date/Time   WBC 8.5 06/07/2017 0936   WBC 8.2 04/25/2017 0946   RBC 3.81 06/07/2017 0936   RBC 4.56 04/25/2017 0946   HGB 10.6 (L) 06/07/2017 0936   HCT 33.3 (L) 06/07/2017 0936   PLT 138 (L) 06/07/2017 0936   MCV 87.4 06/07/2017 0936   MCH 27.8 06/07/2017 0936   MCH 27.9 04/25/2017 0946   MCHC 31.8 06/07/2017 0936   MCHC 31.4 04/25/2017 0946   RDW 12.8 06/07/2017 0936   LYMPHSABS 1.6 06/07/2017 0936   MONOABS 1.2 (H) 06/07/2017 0936   EOSABS 0.0  06/07/2017 0936   BASOSABS 0.0 06/07/2017 0936    CMP     Component Value Date/Time   NA 138 06/07/2017 0936   K 3.4 (L) 06/07/2017 0936   CL 105 04/25/2017 0946   CO2 24 06/07/2017 0936   GLUCOSE 247 (H) 06/07/2017 0936   BUN 17.5 06/07/2017 0936   CREATININE 1.1 06/07/2017 0936   CALCIUM 8.6 06/07/2017 0936   PROT 6.9 06/07/2017 0936   ALBUMIN 3.3 (L) 06/07/2017 0936   AST 48 (H) 06/07/2017 0936   ALT 34 06/07/2017 0936   ALKPHOS 89 06/07/2017 0936   BILITOT 0.28 06/07/2017 0936   GFRNONAA >60 04/25/2017 0946   GFRAA >60 04/25/2017 0946   RADIOGRAPHIC STUDIES:  US Breast Ltd Uni Left Inc Axilla  Result Date: 06/06/2017 CLINICAL DATA:  62 year old who underwent malignant lumpectomy of the upper outer quadrant of the left breast on 04/30/2017, 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 Patient is currently undergoing adjuvant chemotherapy and will undergo adjuvant radiation therapy thereafter. She presents today with a palpable lump in lower outer subareolar left breast. EXAM: 2D DIGITAL DIAGNOSTIC LEFT MAMMOGRAM WITH CAD AND ADJUNCT TOMO ULTRASOUND LEFT BREAST COMPARISON:  Mammography 04/30/2017 (left), 04/29/2017 (left), 04/05/2017 (left), 04/03/2017 (left), 03/28/2017 (bilateral) and earlier. Left breast ultrasound 04/03/2017 was performed in a different part of the breast. ACR Breast Density Category b: There are scattered areas of fibroglandular density. FINDINGS: Standard 2D and tomosynthesis full field CC and MLO views of the left breast were obtained. A standard and tomosynthesis spot-compression tangential view of the palpable concern was also obtained. The spot tangential tomosynthesis images demonstrate a focal asymmetry in the lower inner subareolar left breast which is interspersed with fat corresponding to the palpable concern. There is no associated architectural distortion or suspicious calcifications. Focal asymmetry in the upper outer left breast is  associated with the lumpectomy site, indicating expected post surgical changes. Post surgical scar/architectural distortion is present at the lumpectomy site. No suspicious findings elsewhere in left breast. Mammographic images were processed with CAD. On physical exam, there is a discrete firm palpable approximate 2 cm mass in the lower outer subareolar left breast. Targeted left breast ultrasound is performed, showing an oval parallel hyperechoic mass within the subcutaneous fat at the 9:30 o'clock position approximately 1 cm from the nipple measuring approximately 1.1 x 1.8 x 2.1 cm, demonstrating no posterior characteristics and demonstrating internal power Doppler flow, corresponding to the palpable concern. Sonographic evaluation of the left axilla demonstrates no pathologic lymphadenopathy. IMPRESSION: Indeterminate 2.1 cm mass in the  lower outer periareolar left breast at the 9:30 o'clock position 1 cm from the nipple. RECOMMENDATION: Ultrasound-guided core needle biopsy of the palpable mass in the lower outer periareolar left breast. The ultrasound biopsy procedure was discussed with the patient and her questions were answered. She has agreed to proceed and the biopsy has been scheduled for Wednesday, October 31 at 3:45 p.m. I have discussed the findings and recommendations with the patient. Results were also provided in writing at the conclusion of the visit. If applicable, a reminder letter will be sent to the patient regarding the next appointment. BI-RADS CATEGORY  4: Suspicious. Electronically Signed   By: Evangeline Dakin M.D.   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 Patient is currently undergoing adjuvant chemotherapy and will undergo adjuvant radiation therapy thereafter.  She presents today with a palpable lump in lower outer subareolar left breast. EXAM: 2D DIGITAL DIAGNOSTIC LEFT MAMMOGRAM WITH CAD AND ADJUNCT TOMO ULTRASOUND LEFT BREAST COMPARISON:  Mammography 04/30/2017 (left), 04/29/2017 (left), 04/05/2017 (left), 04/03/2017 (left), 03/28/2017 (bilateral) and earlier. Left breast ultrasound 04/03/2017 was performed in a different part of the breast. ACR Breast Density Category b: There are scattered areas of fibroglandular density. FINDINGS: Standard 2D and tomosynthesis full field CC and MLO views of the left breast were obtained. A standard and tomosynthesis spot-compression tangential view of the palpable concern was also obtained. The spot tangential tomosynthesis images demonstrate a focal asymmetry in the lower inner subareolar left breast which is interspersed with fat corresponding to the palpable concern. There is no associated architectural distortion or suspicious calcifications. Focal asymmetry in the upper outer left breast is associated with the lumpectomy site, indicating expected post surgical changes. Post surgical scar/architectural distortion is present at the lumpectomy site. No suspicious findings elsewhere in left breast. Mammographic images were processed with CAD. On physical exam, there is a discrete firm palpable approximate 2 cm mass in the lower outer subareolar left breast. Targeted left breast ultrasound is performed, showing an oval parallel hyperechoic mass within the subcutaneous fat at the 9:30 o'clock position approximately 1 cm from the nipple measuring approximately 1.1 x 1.8 x 2.1 cm, demonstrating no posterior characteristics and demonstrating internal power Doppler flow, corresponding to the palpable concern. Sonographic evaluation of the left axilla demonstrates no pathologic lymphadenopathy. IMPRESSION: Indeterminate 2.1 cm mass in the lower outer periareolar left breast at the 9:30 o'clock position 1 cm from the nipple. RECOMMENDATION:  Ultrasound-guided core needle biopsy of the palpable mass in the lower outer periareolar left breast. The ultrasound biopsy procedure was discussed with the patient and her questions were answered. She has agreed to proceed and the biopsy has been scheduled for Wednesday, October 31 at 3:45 p.m. I have discussed the findings and recommendations with the patient. Results were also provided in writing at the conclusion of the visit. If applicable, a reminder letter will be sent to the patient regarding the next appointment. BI-RADS CATEGORY  4: Suspicious. Electronically Signed   By: Evangeline Dakin M.D.   On: 06/06/2017 16:33   ASSESSMENT and THERAPY PLAN:   Malignant neoplasm of upper-outer quadrant of left breast in female, estrogen receptor negative ([place]) 04/30/2017: Left lumpectomy: IDC grade 3, 1.7 cm, DCIS, lymphovascular invasion present, margins negative, 0/1 lymph node negative, ER 0%, PR 0%, HER-2 negative ratio  1.37, Ki-67 40%, T1c N0 stage IB  Pathology counseling: I discussed the final pathology report of the patient provided  a copy of this report. I discussed the margins as well as lymph node surgeries. We also discussed the final staging along with previously performed ER/PR and HER-2/neu testing.  Recommendation: 1. adjuvant chemotherapy with dose dense Adriamycin and Cytoxan 4 followed by Taxol weekly 12 ( cannot use steroids because patient is diabetic) 2. Followed by radiation --------------------------------------------------------------------------------------------------------------------------- Current treatment: Cycle 2 day 1 dose dense Adriamycin and Cytoxan Echocardiogram 04/23/2017: EF 55-60%  Chemo Toxicities: 1. Nausea and vomiting 2. Neutropenia: We will decrease dosage with cycle 2. Receiving OnPro 3. Severe fatigue: Patient reports that she is a very busy lady and does not like the fact that she has to rest and not be as active as before.  Return to clinic  in 2 weeks for cycle 3 She was given a prescription for cranial prosthesis today.   Orders Placed This Encounter  Procedures  . 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 Dx. of breast Peters.    All questions were answered. The patient knows to call the clinic with any problems, questions or concerns. We can certainly see the patient much sooner if necessary.  Mikey Bussing, NP 06/07/2017

## 2017-06-12 ENCOUNTER — Ambulatory Visit
Admission: RE | Admit: 2017-06-12 | Discharge: 2017-06-12 | Disposition: A | Discharge status: Home / Self Care | Payer: BC Managed Care – PPO | Source: Ambulatory Visit | Attending: Hematology and Oncology | Admitting: Hematology and Oncology

## 2017-06-12 ENCOUNTER — Other Ambulatory Visit: Payer: Self-pay | Admitting: Hematology and Oncology

## 2017-06-12 DIAGNOSIS — N6320 Unspecified lump in the left breast, unspecified quadrant: Secondary | ICD-10-CM

## 2017-06-21 ENCOUNTER — Telehealth: Payer: Self-pay

## 2017-06-21 ENCOUNTER — Encounter: Payer: Self-pay | Admitting: Adult Health

## 2017-06-21 ENCOUNTER — Ambulatory Visit: Payer: BC Managed Care – PPO

## 2017-06-21 ENCOUNTER — Ambulatory Visit (HOSPITAL_BASED_OUTPATIENT_CLINIC_OR_DEPARTMENT_OTHER): Payer: BC Managed Care – PPO

## 2017-06-21 ENCOUNTER — Ambulatory Visit (HOSPITAL_BASED_OUTPATIENT_CLINIC_OR_DEPARTMENT_OTHER): Payer: BC Managed Care – PPO | Admitting: Adult Health

## 2017-06-21 ENCOUNTER — Telehealth: Payer: Self-pay | Admitting: Adult Health

## 2017-06-21 ENCOUNTER — Other Ambulatory Visit (HOSPITAL_BASED_OUTPATIENT_CLINIC_OR_DEPARTMENT_OTHER): Payer: BC Managed Care – PPO

## 2017-06-21 DIAGNOSIS — Z171 Estrogen receptor negative status [ER-]: Principal | ICD-10-CM

## 2017-06-21 DIAGNOSIS — R530 Neoplastic (malignant) related fatigue: Secondary | ICD-10-CM

## 2017-06-21 DIAGNOSIS — B370 Candidal stomatitis: Secondary | ICD-10-CM | POA: Diagnosis not present

## 2017-06-21 DIAGNOSIS — N179 Acute kidney failure, unspecified: Secondary | ICD-10-CM | POA: Diagnosis not present

## 2017-06-21 DIAGNOSIS — E119 Type 2 diabetes mellitus without complications: Secondary | ICD-10-CM

## 2017-06-21 DIAGNOSIS — C50412 Malignant neoplasm of upper-outer quadrant of left female breast: Secondary | ICD-10-CM

## 2017-06-21 DIAGNOSIS — I10 Essential (primary) hypertension: Secondary | ICD-10-CM

## 2017-06-21 DIAGNOSIS — Z95828 Presence of other vascular implants and grafts: Secondary | ICD-10-CM

## 2017-06-21 LAB — CBC WITH DIFFERENTIAL/PLATELET
BASO%: 0.2 % (ref 0.0–2.0)
BASOS ABS: 0 10*3/uL (ref 0.0–0.1)
EOS ABS: 0 10*3/uL (ref 0.0–0.5)
EOS%: 0.1 % (ref 0.0–7.0)
HCT: 30.7 % — ABNORMAL LOW (ref 34.8–46.6)
HEMOGLOBIN: 10.2 g/dL — AB (ref 11.6–15.9)
LYMPH%: 11 % — ABNORMAL LOW (ref 14.0–49.7)
MCH: 28.4 pg (ref 25.1–34.0)
MCHC: 33.3 g/dL (ref 31.5–36.0)
MCV: 85.4 fL (ref 79.5–101.0)
MONO#: 0.9 10*3/uL (ref 0.1–0.9)
MONO%: 11 % (ref 0.0–14.0)
NEUT#: 6.6 10*3/uL — ABNORMAL HIGH (ref 1.5–6.5)
NEUT%: 77.7 % — AB (ref 38.4–76.8)
Platelets: 156 10*3/uL (ref 145–400)
RBC: 3.59 10*6/uL — ABNORMAL LOW (ref 3.70–5.45)
RDW: 12.9 % (ref 11.2–14.5)
WBC: 8.5 10*3/uL (ref 3.9–10.3)
lymph#: 0.9 10*3/uL (ref 0.9–3.3)

## 2017-06-21 LAB — COMPREHENSIVE METABOLIC PANEL
ALK PHOS: 82 U/L (ref 40–150)
ALT: 26 U/L (ref 0–55)
AST: 29 U/L (ref 5–34)
Albumin: 3.4 g/dL — ABNORMAL LOW (ref 3.5–5.0)
Anion Gap: 11 mEq/L (ref 3–11)
BUN: 35.8 mg/dL — AB (ref 7.0–26.0)
CO2: 23 meq/L (ref 22–29)
Calcium: 8.6 mg/dL (ref 8.4–10.4)
Chloride: 103 mEq/L (ref 98–109)
Creatinine: 2.1 mg/dL — ABNORMAL HIGH (ref 0.6–1.1)
EGFR: 28 mL/min/{1.73_m2} — ABNORMAL LOW (ref >60)
GLUCOSE: 264 mg/dL — AB (ref 70–140)
POTASSIUM: 4.1 meq/L (ref 3.5–5.1)
SODIUM: 138 meq/L (ref 136–145)
Total Bilirubin: 0.48 mg/dL (ref 0.20–1.20)
Total Protein: 7 g/dL (ref 6.4–8.3)

## 2017-06-21 MED ORDER — HEPARIN SOD (PORK) LOCK FLUSH 100 UNIT/ML IV SOLN
500.0000 [IU] | Freq: Once | INTRAVENOUS | Status: AC | PRN
Start: 2017-06-21 — End: 2017-06-21
  Administered 2017-06-21: 500 [IU]
  Filled 2017-06-21: qty 5

## 2017-06-21 MED ORDER — CYCLOPHOSPHAMIDE CHEMO INJECTION 1 GM: 500.0000 mg/m2 | Freq: Once | INTRAMUSCULAR | Status: DC

## 2017-06-21 MED ORDER — SODIUM CHLORIDE 0.9 % IV SOLN: Freq: Once | INTRAVENOUS | Status: DC

## 2017-06-21 MED ORDER — PEGFILGRASTIM 6 MG/0.6ML ~~LOC~~ PSKT: 6.0000 mg | PREFILLED_SYRINGE | Freq: Once | SUBCUTANEOUS | Status: DC

## 2017-06-21 MED ORDER — DOXORUBICIN HCL CHEMO IV INJECTION 2 MG/ML: 50.0000 mg/m2 | Freq: Once | INTRAVENOUS | Status: DC

## 2017-06-21 MED ORDER — PALONOSETRON HCL INJECTION 0.25 MG/5ML: 0.2500 mg | Freq: Once | INTRAVENOUS | Status: DC

## 2017-06-21 MED ORDER — FLUCONAZOLE 200 MG PO TABS: 200.0000 mg | ORAL_TABLET | Freq: Every day | ORAL | 0 refills | Status: DC

## 2017-06-21 MED ORDER — SODIUM CHLORIDE 0.9 % IV SOLN
INTRAVENOUS | Status: DC
  Administered 2017-06-21: 13:00:00 via INTRAVENOUS

## 2017-06-21 MED ORDER — MAGIC MOUTHWASH: 5.0000 mL | Freq: Four times a day (QID) | ORAL | 0 refills | Status: DC | PRN

## 2017-06-21 MED ORDER — SODIUM CHLORIDE 0.9% FLUSH
10.0000 mL | INTRAVENOUS | Status: DC | PRN
  Administered 2017-06-21: 10 mL
  Filled 2017-06-21: qty 10

## 2017-06-21 MED ORDER — SODIUM CHLORIDE 0.9% FLUSH
10.0000 mL | INTRAVENOUS | Status: DC | PRN
  Administered 2017-06-21: 10 mL via INTRAVENOUS
  Filled 2017-06-21: qty 10

## 2017-06-21 NOTE — Telephone Encounter (Signed)
Spoke with Audrey Peters from Dr. Cindra Eves office about creatinine level 2.1.  Patient is stop metformin and mobic and drink plenty of water.  Labs to be drawn at center on Monday and send results to Dr. Buddy Duty.

## 2017-06-21 NOTE — Assessment & Plan Note (Addendum)
04/30/2017: Left lumpectomy: IDC grade 3, 1.7 cm, DCIS, lymphovascular invasion present, margins negative, 0/1 lymph node negative, ER 0%, PR 0%, HER-2 negative ratio 1.37, Ki-67 40%, T1c N0 stage IB  Pathology counseling: I discussed the final pathology report of the patient provided  a copy of this report. I discussed the margins as well as lymph node surgeries. We also discussed the final staging along with previously performed ER/PR and HER-2/neu testing.  Recommendation: 1. adjuvant chemotherapy with dose dense Adriamycin and Cytoxan 4 followed by Taxol weekly 12 ( cannot use steroids because patient is diabetic) 2. Followed by radiation --------------------------------------------------------------------------------------------------------------------------- Current treatment: Cycle 3 day 1 dose dense Adriamycin and Cytoxan Echocardiogram 04/23/2017: EF 55-60%  Audrey Peters is doing moderately well today.  I sent in a week of Diflucan for her to take in addition to magic mouthwash QID PRN for her mouth.  Her CMET resulted after her appointment was over demonstrating an increase in her kidney function to 2.1.  Due to her acute kidney injury, she will not receive chemotherapy.  She will instead receive 1 L iv fluids today and tomorrow, and return on Monday for labs, f/u with Dr. Lindi Adie. Audrey Peters has h/o HTN, Diabetes (uncontrolled).  Due to this, she will need to stop some of her medications.  I printed out a list of her medications and wrote instructions of which ones to stop in detail along with the plan.    She will stop: Maxzide, Losartan, Metformin, Meloxicam.   I reviewed this in detail with Dr. Burr Medico.  We also called Dr. Cindra Eves office to see if they could reach out to the patient about how to adjust her insulin over the weekend since she isn't taking Metformin.  They requested we fax her creatinine results on Monday to their office.  Audrey Peters is tearful with the plan, but does understand it.

## 2017-06-21 NOTE — Patient Instructions (Signed)
Dehydration, Adult Dehydration is when there is not enough fluid or water in your body. This happens when you lose more fluids than you take in. Dehydration can range from mild to very bad. It should be treated right away to keep it from getting very bad. Symptoms of mild dehydration may include:  Thirst.  Dry lips.  Slightly dry mouth.  Dry, warm skin.  Dizziness. Symptoms of moderate dehydration may include:  Very dry mouth.  Muscle cramps.  Dark pee (urine). Pee may be the color of tea.  Your body making less pee.  Your eyes making fewer tears.  Heartbeat that is uneven or faster than normal (palpitations).  Headache.  Light-headedness, especially when you stand up from sitting.  Fainting (syncope). Symptoms of very bad dehydration may include:  Changes in skin, such as: ? Cold and clammy skin. ? Blotchy (mottled) or pale skin. ? Skin that does not quickly return to normal after being lightly pinched and let go (poor skin turgor).  Changes in body fluids, such as: ? Feeling very thirsty. ? Your eyes making fewer tears. ? Not sweating when body temperature is high, such as in hot weather. ? Your body making very little pee.  Changes in vital signs, such as: ? Weak pulse. ? Pulse that is more than 100 beats a minute when you are sitting still. ? Fast breathing. ? Low blood pressure.  Other changes, such as: ? Sunken eyes. ? Cold hands and feet. ? Confusion. ? Lack of energy (lethargy). ? Trouble waking up from sleep. ? Short-term weight loss. ? Unconsciousness. Follow these instructions at home:  If told by your doctor, drink an ORS: ? Make an ORS by using instructions on the package. ? Start by drinking small amounts, about  cup (120 mL) every 5-10 minutes. ? Slowly drink more until you have had the amount that your doctor said to have.  Drink enough clear fluid to keep your pee clear or pale yellow. If you were told to drink an ORS, finish the ORS  first, then start slowly drinking clear fluids. Drink fluids such as: ? Water. Do not drink only water by itself. Doing that can make the salt (sodium) level in your body get too low (hyponatremia). ? Ice chips. ? Fruit juice that you have added water to (diluted). ? Low-calorie sports drinks.  Avoid: ? Alcohol. ? Drinks that have a lot of sugar. These include high-calorie sports drinks, fruit juice that does not have water added, and soda. ? Caffeine. ? Foods that are greasy or have a lot of fat or sugar.  Take over-the-counter and prescription medicines only as told by your doctor.  Do not take salt tablets. Doing that can make the salt level in your body get too high (hypernatremia).  Eat foods that have minerals (electrolytes). Examples include bananas, oranges, potatoes, tomatoes, and spinach.  Keep all follow-up visits as told by your doctor. This is important. Contact a doctor if:  You have belly (abdominal) pain that: ? Gets worse. ? Stays in one area (localizes).  You have a rash.  You have a stiff neck.  You get angry or annoyed more easily than normal (irritability).  You are more sleepy than normal.  You have a harder time waking up than normal.  You feel: ? Weak. ? Dizzy. ? Very thirsty.  You have peed (urinated) only a small amount of very dark pee during 6-8 hours. Get help right away if:  You have symptoms of   very bad dehydration.  You cannot drink fluids without throwing up (vomiting).  Your symptoms get worse with treatment.  You have a fever.  You have a very bad headache.  You are throwing up or having watery poop (diarrhea) and it: ? Gets worse. ? Does not go away.  You have blood or something green (bile) in your throw-up.  You have blood in your poop (stool). This may cause poop to look black and tarry.  You have not peed in 6-8 hours.  You pass out (faint).  Your heart rate when you are sitting still is more than 100 beats a  minute.  You have trouble breathing. This information is not intended to replace advice given to you by your health care provider. Make sure you discuss any questions you have with your health care provider. Document Released: 05/26/2009 Document Revised: 02/17/2016 Document Reviewed: 09/23/2015 Elsevier Interactive Patient Education  2018 [employer]  

## 2017-06-21 NOTE — Telephone Encounter (Signed)
Scheduled appt per 11/9 los - Gave patient AVS and calender per los.  

## 2017-06-21 NOTE — Progress Notes (Signed)
Pt request to leave PAC needle in over night, coming back for IVF tomorrow morning.  Pt did not want dressing changed for biopatch.

## 2017-06-21 NOTE — Progress Notes (Signed)
[facility] up:    [name], MD [address] [place] [address]   DIAGNOSIS: Cancer Staging Malignant neoplasm of upper-outer quadrant of left breast in female, estrogen receptor negative ([place]) Staging form: Breast, AJCC 8th Edition - Clinical: No stage assigned - Unsigned - Pathologic: Stage IB (pT1c, pN0, cM0, G3, ER: Negative, PR: Negative, HER2: Negative) - Signed by Eppie Gibson, MD on 05/14/2017   SUMMARY OF ONCOLOGIC HISTORY:   Malignant neoplasm of upper-outer quadrant of left breast in female, estrogen receptor negative ([place])   04/05/2017 Initial Diagnosis    Left breast asymmetry by ultrasound measured 1.3 cm at 2:30 position 10 cm from nipple, no axillary lymph nodes; biopsy IDC grade 2, ER 0%, PR 0%, HER-2 negative ratio 1.37, Ki-67 40%, T1c N0 stage IB AJCC 8       04/30/2017 Surgery    Left lumpectomy: IDC grade 3, 1.7 cm, DCIS, lymphovascular invasion present, margins negative, 0/1 lymph node negative, ER 0%, PR 0%, HER-2 negative ratio 1.37, Ki-67 40%, T1c N0 stage IB      05/22/2017 Genetic Testing    Patient had genetic testing due to a personal history of triple negative breast cancer.  The Common Hereditary Cancer Panel was ordered. The Hereditary Gene Panel offered by [facility] includes sequencing and/or deletion duplication testing of the following 46 genes: APC, ATM, AXIN2, BARD1, BMPR1A, BRCA1, BRCA2, BRIP1, CDH1, CDKN2A (p14ARF), CDKN2A (p16INK4a), CHEK2, CTNNA1, DICER1, EPCAM (Deletion/duplication testing only), GREM1 (promoter region deletion/duplication testing only), KIT, MEN1, MLH1, MSH2, MSH3, MSH6, MUTYH, NBN, NF1, NHTL1, PALB2, PDGFRA, PMS2, POLD1, POLE, PTEN, RAD50, RAD51C, RAD51D, SDHB, SDHC, SDHD, SMAD4, SMARCA4. STK11, TP53, TSC1, TSC2, and VHL.  The following genes were evaluated for sequence changes only: SDHA and HOXB13 c.251G>A variant only.    Results: No pathogenic mutations identified.  A VUS in  ATM c.4279G>A (p.Ala1427Thr) was identified.  The date of this test report is 05/22/2017.       05/24/2017 -  Chemotherapy    Dose dense Adriamycin and Cytoxan 4 followed by Taxol weekly 12        CURRENT THERAPY: Adriamycin and Cytoxan cycle 3 day 1  INTERVAL HISTORY: Audrey Peters 61 y.o. female returns for evaluation prior to receiving her third cycle of Adriamycin/Cytoxan.  She has had mucositis with this treatment and feels like there is a cut in her mouth.  This has decreased her oral intake, but she tells me she is eating and drinking the best she can.  She is unable to quantify it.  She is tearful today because she is fatigued from treatment and it is keeping her from doing her normal activities which she describes as being on the go.  She denies any other challenges at this time.     Patient Active Problem List   Diagnosis Date Noted  . Encounter for antineoplastic chemotherapy 06/07/2017  . Port-A-Cath in place 05/24/2017  . Genetic testing 05/23/2017  . Malignant neoplasm of upper-outer quadrant of left breast in female, estrogen receptor negative ([place]) 04/22/2017  . Essential hypertension 01/30/2015  . Diabetes mellitus ([place]) 01/30/2015    is allergic to invokana [canagliflozin]; sulfa antibiotics; and ciprofloxacin.  MEDICAL HISTORY: Past Medical History:  Diagnosis Date  . Anemia yrs ago  . Arthritis   . Breast cancer ([place])   . Cancer [facility])    recent dx in breast  . Carpal tunnel syndrome of right wrist   . Diabetes mellitus without  complication ([place])    dx 2008  . Headache    sinus  . Hypertension   . Personal history of chemotherapy   . Sleep apnea    does not use cpap  . Vaginal delivery 1983    SURGICAL HISTORY: Past Surgical History:  Procedure Laterality Date  . BREAST LUMPECTOMY Left   . DILATION AND CURETTAGE OF UTERUS      SOCIAL HISTORY: Social History   Socioeconomic History  . Marital status: Married    Spouse name: Not on file   . Number of children: Not on file  . Years of education: Not on file  . Highest education level: Not on file  Social Needs  . Financial resource strain: Not on file  . Food insecurity - worry: Not on file  . Food insecurity - inability: Not on file  . Transportation needs - medical: Not on file  . Transportation needs - non-medical: Not on file  Occupational History  . Not on file  Tobacco Use  . Smoking status: Never Smoker  . Smokeless tobacco: Never Used  Substance and Sexual Activity  . Alcohol use: Yes    Alcohol/week: 0.0 oz    Comment: occ wine  . Drug use: No  . Sexual activity: Not on file  Other Topics Concern  . Not on file  Social History Narrative  . Not on file    FAMILY HISTORY: Family History  Problem Relation Age of Onset  . Diabetes Mother   . Hypertension Mother     Review of Systems  Constitutional: Positive for fatigue and unexpected weight change. Negative for appetite change, chills and fever.  HENT:   Positive for mouth sores. Negative for hearing loss and lump/mass.   Eyes: Negative for eye problems and icterus.  Respiratory: Negative for chest tightness, cough and shortness of breath.   Cardiovascular: Negative for chest pain, leg swelling and palpitations.  Gastrointestinal: Negative for abdominal distention, abdominal pain, constipation, diarrhea, nausea and vomiting.  Endocrine: Negative for hot flashes.  Musculoskeletal: Negative for arthralgias.  Skin: Negative for itching and rash.  Neurological: Negative for dizziness, extremity weakness, headaches and numbness.  Hematological: Negative for adenopathy. Does not bruise/bleed easily.  Psychiatric/Behavioral: Negative for depression. The patient is not nervous/anxious.       PHYSICAL EXAMINATION  ECOG PERFORMANCE STATUS: 2 - Symptomatic, <50% confined to bed  Vitals:   06/21/17 1056  BP: (!) 105/58  Pulse: 84  Resp: 17  Temp: (!) 97.4 F (36.3 C)  SpO2: 100%    Physical  Exam  Constitutional: She is oriented to person, place, and time and well-developed, well-nourished, and in no distress.  HENT:  Head: Normocephalic and atraumatic.  Mouth/Throat: Oropharynx is clear and moist.  One area of thrush, very small, in right buccal mucosa, no other lesions seen throughout mouth  Eyes: No scleral icterus.  Neck: Neck supple.  Cardiovascular: Normal rate, regular rhythm and normal heart sounds.  Pulmonary/Chest: Effort normal and breath sounds normal.  Abdominal: Soft. Bowel sounds are normal. She exhibits no distension and no mass. There is no tenderness. There is no rebound.  Musculoskeletal: Normal range of motion. She exhibits no edema.  Lymphadenopathy:    She has no cervical adenopathy.  Neurological: She is alert and oriented to person, place, and time.  Skin: Skin is warm and dry.  Psychiatric:  Appears anxious and flat    LABORATORY DATA:  CBC    Component Value Date/Time   WBC 8.5  06/21/2017 1017   WBC 8.2 04/25/2017 0946   RBC 3.59 (L) 06/21/2017 1017   RBC 4.56 04/25/2017 0946   HGB 10.2 (L) 06/21/2017 1017   HCT 30.7 (L) 06/21/2017 1017   PLT 156 06/21/2017 1017   MCV 85.4 06/21/2017 1017   MCH 28.4 06/21/2017 1017   MCH 27.9 04/25/2017 0946   MCHC 33.3 06/21/2017 1017   MCHC 31.4 04/25/2017 0946   RDW 12.9 06/21/2017 1017   LYMPHSABS 0.9 06/21/2017 1017   MONOABS 0.9 06/21/2017 1017   EOSABS 0.0 06/21/2017 1017   BASOSABS 0.0 06/21/2017 1017    CMP     Component Value Date/Time   NA 138 06/21/2017 1017   K 4.1 06/21/2017 1017   CL 105 04/25/2017 0946   CO2 23 06/21/2017 1017   GLUCOSE 264 (H) 06/21/2017 1017   BUN 35.8 (H) 06/21/2017 1017   CREATININE 2.1 (H) 06/21/2017 1017   CALCIUM 8.6 06/21/2017 1017   PROT 7.0 06/21/2017 1017   ALBUMIN 3.4 (L) 06/21/2017 1017   AST 29 06/21/2017 1017   ALT 26 06/21/2017 1017   ALKPHOS 82 06/21/2017 1017   BILITOT 0.48 06/21/2017 1017   GFRNONAA >60 04/25/2017 0946   GFRAA >60  04/25/2017 0946      ASSESSMENT and PLAN:   Malignant neoplasm of upper-outer quadrant of left breast in female, estrogen receptor negative ([place]) 04/30/2017: Left lumpectomy: IDC grade 3, 1.7 cm, DCIS, lymphovascular invasion present, margins negative, 0/1 lymph node negative, ER 0%, PR 0%, HER-2 negative ratio 1.37, Ki-67 40%, T1c N0 stage IB  Pathology counseling: I discussed the final pathology report of the patient provided  a copy of this report. I discussed the margins as well as lymph node surgeries. We also discussed the final staging along with previously performed ER/PR and HER-2/neu testing.  Recommendation: 1. adjuvant chemotherapy with dose dense Adriamycin and Cytoxan 4 followed by Taxol weekly 12 ( cannot use steroids because patient is diabetic) 2. Followed by radiation --------------------------------------------------------------------------------------------------------------------------- Current treatment: Cycle 3 day 1 dose dense Adriamycin and Cytoxan Echocardiogram 04/23/2017: EF 55-60%  Audrey Peters is doing moderately well today.  I sent in a week of Diflucan for her to take in addition to magic mouthwash QID PRN for her mouth.  Her CMET resulted after her appointment was over demonstrating an increase in her kidney function to 2.1.  Due to her acute kidney injury, she will not receive chemotherapy.  She will instead receive 1 L iv fluids today and tomorrow, and return on Monday for labs, f/u with Dr. Lindi Adie. [place] has h/o HTN, Diabetes (uncontrolled).  Due to this, she will need to stop some of her medications.  I printed out a list of her medications and wrote instructions of which ones to stop in detail along with the plan.    She will stop: Maxzide, Losartan, Metformin, Meloxicam.   I reviewed this in detail with Dr. Burr Medico.  We also called Dr. Cindra Eves office to see if they could reach out to the patient about how to adjust her insulin over the weekend since she isn't  taking Metformin.  They requested we fax her creatinine results on Monday to their office.  Hana is tearful with the plan, but does understand it.     All questions were answered. The patient knows to call the clinic with any problems, questions or concerns. We can certainly see the patient much sooner if necessary.  A total of (30) minutes of face-to-face time was spent with this  patient with greater than 50% of that time in counseling and care-coordination.  This note was electronically signed. Scot Dock, NP 06/21/2017

## 2017-06-22 ENCOUNTER — Ambulatory Visit (HOSPITAL_BASED_OUTPATIENT_CLINIC_OR_DEPARTMENT_OTHER): Payer: BC Managed Care – PPO

## 2017-06-22 VITALS — BP 114/67 | HR 78 | Temp 98.2°F | Resp 18

## 2017-06-22 DIAGNOSIS — Z171 Estrogen receptor negative status [ER-]: Secondary | ICD-10-CM

## 2017-06-22 DIAGNOSIS — Z95828 Presence of other vascular implants and grafts: Secondary | ICD-10-CM

## 2017-06-22 DIAGNOSIS — C50412 Malignant neoplasm of upper-outer quadrant of left female breast: Secondary | ICD-10-CM | POA: Diagnosis not present

## 2017-06-22 MED ORDER — SODIUM CHLORIDE 0.9 % IV SOLN
INTRAVENOUS | Status: DC
  Administered 2017-06-22: 09:00:00 via INTRAVENOUS

## 2017-06-22 MED ORDER — SODIUM CHLORIDE 0.9% FLUSH
10.0000 mL | INTRAVENOUS | Status: DC | PRN
  Administered 2017-06-22: 10 mL via INTRAVENOUS
  Filled 2017-06-22: qty 10

## 2017-06-22 MED ORDER — HEPARIN SOD (PORK) LOCK FLUSH 100 UNIT/ML IV SOLN
500.0000 [IU] | Freq: Once | INTRAVENOUS | Status: AC
  Administered 2017-06-22: 500 [IU] via INTRAVENOUS
  Filled 2017-06-22: qty 5

## 2017-06-22 NOTE — Patient Instructions (Signed)
Dehydration, Adult Dehydration is a condition in which there is not enough fluid or water in the body. This happens when you lose more fluids than you take in. Important organs, such as the kidneys, brain, and heart, cannot function without a proper amount of fluids. Any loss of fluids from the body can lead to dehydration. Dehydration can range from mild to severe. This condition should be treated right away to prevent it from becoming severe. What are the causes? This condition may be caused by:  Vomiting.  Diarrhea.  Excessive sweating, such as from heat exposure or exercise.  Not drinking enough fluid, especially: ? When ill. ? While doing activity that requires a lot of energy.  Excessive urination.  Fever.  Infection.  Certain medicines, such as medicines that cause the body to lose excess fluid (diuretics).  Inability to access safe drinking water.  Reduced physical ability to get adequate water and food.  What increases the risk? This condition is more likely to develop in people:  Who have a poorly controlled long-term (chronic) illness, such as diabetes, heart disease, or kidney disease.  Who are age 65 or older.  Who are disabled.  Who live in a place with high altitude.  Who play endurance sports.  What are the signs or symptoms? Symptoms of mild dehydration may include:  Thirst.  Dry lips.  Slightly dry mouth.  Dry, warm skin.  Dizziness. Symptoms of moderate dehydration may include:  Very dry mouth.  Muscle cramps.  Dark urine. Urine may be the color of tea.  Decreased urine production.  Decreased tear production.  Heartbeat that is irregular or faster than normal (palpitations).  Headache.  Light-headedness, especially when you stand up from a sitting position.  Fainting (syncope). Symptoms of severe dehydration may include:  Changes in skin, such as: ? Cold and clammy skin. ? Blotchy (mottled) or pale skin. ? Skin that does  not quickly return to normal after being lightly pinched and released (poor skin turgor).  Changes in body fluids, such as: ? Extreme thirst. ? No tear production. ? Inability to sweat when body temperature is high, such as in hot weather. ? Very little urine production.  Changes in vital signs, such as: ? Weak pulse. ? Pulse that is more than 100 beats a minute when sitting still. ? Rapid breathing. ? Low blood pressure.  Other changes, such as: ? Sunken eyes. ? Cold hands and feet. ? Confusion. ? Lack of energy (lethargy). ? Difficulty waking up from sleep. ? Short-term weight loss. ? Unconsciousness. How is this diagnosed? This condition is diagnosed based on your symptoms and a physical exam. Blood and urine tests may be done to help confirm the diagnosis. How is this treated? Treatment for this condition depends on the severity. Mild or moderate dehydration can often be treated at home. Treatment should be started right away. Do not wait until dehydration becomes severe. Severe dehydration is an emergency and it needs to be treated in a hospital. Treatment for mild dehydration may include:  Drinking more fluids.  Replacing salts and minerals in your blood (electrolytes) that you may have lost. Treatment for moderate dehydration may include:  Drinking an oral rehydration solution (ORS). This is a drink that helps you replace fluids and electrolytes (rehydrate). It can be found at pharmacies and retail stores. Treatment for severe dehydration may include:  Receiving fluids through an IV tube.  Receiving an electrolyte solution through a feeding tube that is passed through your nose   and into your stomach (nasogastric tube, or NG tube).  Correcting any abnormalities in electrolytes.  Treating the underlying cause of dehydration. Follow these instructions at home:  If directed by your health care provider, drink an ORS: ? Make an ORS by following instructions on the  package. ? Start by drinking small amounts, about  cup (120 mL) every 5-10 minutes. ? Slowly increase how much you drink until you have taken the amount recommended by your health care provider.  Drink enough clear fluid to keep your urine clear or pale yellow. If you were told to drink an ORS, finish the ORS first, then start slowly drinking other clear fluids. Drink fluids such as: ? Water. Do not drink only water. Doing that can lead to having too little salt (sodium) in the body (hyponatremia). ? Ice chips. ? Fruit juice that you have added water to (diluted fruit juice). ? Low-calorie sports drinks.  Avoid: ? Alcohol. ? Drinks that contain a lot of sugar. These include high-calorie sports drinks, fruit juice that is not diluted, and soda. ? Caffeine. ? Foods that are greasy or contain a lot of fat or sugar.  Take over-the-counter and prescription medicines only as told by your health care provider.  Do not take sodium tablets. This can lead to having too much sodium in the body (hypernatremia).  Eat foods that contain a healthy balance of electrolytes, such as bananas, oranges, potatoes, tomatoes, and spinach.  Keep all follow-up visits as told by your health care provider. This is important. Contact a health care provider if:  You have abdominal pain that: ? Gets worse. ? Stays in one area (localizes).  You have a rash.  You have a stiff neck.  You are more irritable than usual.  You are sleepier or more difficult to wake up than usual.  You feel weak or dizzy.  You feel very thirsty.  You have urinated only a small amount of very dark urine over 6-8 hours. Get help right away if:  You have symptoms of severe dehydration.  You cannot drink fluids without vomiting.  Your symptoms get worse with treatment.  You have a fever.  You have a severe headache.  You have vomiting or diarrhea that: ? Gets worse. ? Does not go away.  You have blood or green matter  (bile) in your vomit.  You have blood in your stool. This may cause stool to look black and tarry.  You have not urinated in 6-8 hours.  You faint.  Your heart rate while sitting still is over 100 beats a minute.  You have trouble breathing. This information is not intended to replace advice given to you by your health care provider. Make sure you discuss any questions you have with your health care provider. Document Released: 07/30/2005 Document Revised: 02/24/2016 Document Reviewed: 09/23/2015 Elsevier Interactive Patient Education  2018 [employer]  

## 2017-06-24 ENCOUNTER — Ambulatory Visit: Payer: BC Managed Care – PPO | Admitting: Hematology and Oncology

## 2017-06-24 ENCOUNTER — Other Ambulatory Visit (HOSPITAL_BASED_OUTPATIENT_CLINIC_OR_DEPARTMENT_OTHER): Payer: BC Managed Care – PPO

## 2017-06-24 ENCOUNTER — Ambulatory Visit (HOSPITAL_BASED_OUTPATIENT_CLINIC_OR_DEPARTMENT_OTHER): Payer: BC Managed Care – PPO | Admitting: Hematology and Oncology

## 2017-06-24 ENCOUNTER — Ambulatory Visit: Payer: BC Managed Care – PPO | Admitting: Adult Health

## 2017-06-24 DIAGNOSIS — E119 Type 2 diabetes mellitus without complications: Secondary | ICD-10-CM

## 2017-06-24 DIAGNOSIS — Z171 Estrogen receptor negative status [ER-]: Secondary | ICD-10-CM

## 2017-06-24 DIAGNOSIS — N179 Acute kidney failure, unspecified: Secondary | ICD-10-CM

## 2017-06-24 DIAGNOSIS — D6481 Anemia due to antineoplastic chemotherapy: Secondary | ICD-10-CM

## 2017-06-24 DIAGNOSIS — L658 Other specified nonscarring hair loss: Secondary | ICD-10-CM | POA: Diagnosis not present

## 2017-06-24 DIAGNOSIS — C50412 Malignant neoplasm of upper-outer quadrant of left female breast: Secondary | ICD-10-CM

## 2017-06-24 LAB — CBC WITH DIFFERENTIAL/PLATELET
BASO%: 0.2 % (ref 0.0–2.0)
BASOS ABS: 0 10*3/uL (ref 0.0–0.1)
EOS ABS: 0 10*3/uL (ref 0.0–0.5)
EOS%: 0.1 % (ref 0.0–7.0)
HEMATOCRIT: 31.1 % — AB (ref 34.8–46.6)
HGB: 10 g/dL — ABNORMAL LOW (ref 11.6–15.9)
LYMPH#: 1 10*3/uL (ref 0.9–3.3)
LYMPH%: 12.5 % — ABNORMAL LOW (ref 14.0–49.7)
MCH: 28 pg (ref 25.1–34.0)
MCHC: 32.2 g/dL (ref 31.5–36.0)
MCV: 87.1 fL (ref 79.5–101.0)
MONO#: 0.9 10*3/uL (ref 0.1–0.9)
MONO%: 10.7 % (ref 0.0–14.0)
NEUT#: 6.1 10*3/uL (ref 1.5–6.5)
NEUT%: 76.5 % (ref 38.4–76.8)
PLATELETS: 127 10*3/uL — AB (ref 145–400)
RBC: 3.57 10*6/uL — ABNORMAL LOW (ref 3.70–5.45)
RDW: 12.7 % (ref 11.2–14.5)
WBC: 8 10*3/uL (ref 3.9–10.3)

## 2017-06-24 LAB — COMPREHENSIVE METABOLIC PANEL
ALT: 27 U/L (ref 0–55)
AST: 31 U/L (ref 5–34)
Albumin: 3.3 g/dL — ABNORMAL LOW (ref 3.5–5.0)
Alkaline Phosphatase: 75 U/L (ref 40–150)
Anion Gap: 9 mEq/L (ref 3–11)
BILIRUBIN TOTAL: 0.35 mg/dL (ref 0.20–1.20)
BUN: 22.4 mg/dL (ref 7.0–26.0)
CO2: 25 meq/L (ref 22–29)
Calcium: 8.5 mg/dL (ref 8.4–10.4)
Chloride: 105 mEq/L (ref 98–109)
Creatinine: 1.3 mg/dL — ABNORMAL HIGH (ref 0.6–1.1)
EGFR: 50 mL/min/{1.73_m2} — AB (ref >60)
GLUCOSE: 402 mg/dL — AB (ref 70–140)
Potassium: 4 mEq/L (ref 3.5–5.1)
SODIUM: 140 meq/L (ref 136–145)
TOTAL PROTEIN: 6.8 g/dL (ref 6.4–8.3)

## 2017-06-24 NOTE — Assessment & Plan Note (Signed)
04/30/2017: Left lumpectomy: IDC grade 3, 1.7 cm, DCIS, lymphovascular invasion present, margins negative, 0/1 lymph node negative, ER 0%, PR 0%, HER-2 negative ratio 1.37, Ki-67 40%, T1c N0 stage IB  Pathology counseling: I discussed the final pathology report of the patient provided  a copy of this report. I discussed the margins as well as lymph node surgeries. We also discussed the final staging along with previously performed ER/PR and HER-2/neu testing.  Recommendation: 1. adjuvant chemotherapy with dose dense Adriamycin and Cytoxan 4 followed by Taxol weekly 12 ( cannot use steroids because patient is diabetic) 2. Followed by radiation --------------------------------------------------------------------------------------------------------------------------- Current treatment: Cycle 3 of dose dense Adriamycin and Cytoxan (postponed due to toxicities) Echocardiogram 04/23/2017: EF 55-60%  Chemo toxicities: 1. Mucositis: Resolved 2. Acute renal failure: Market improvement creatinine is 1.3 today 3. Anemia due to chemotherapy Alopecia We will dose reduce the cycle 3 of chemotherapy.

## 2017-06-24 NOTE — Progress Notes (Signed)
Patient Care Team: Audrey Carol, MD as PCP - General (Internal Medicine)  DIAGNOSIS:  Encounter Diagnosis  Name Primary?  . Malignant neoplasm of upper-outer quadrant of left breast in female, estrogen receptor negative ([place])     SUMMARY OF ONCOLOGIC HISTORY:   Malignant neoplasm of upper-outer quadrant of left breast in female, estrogen receptor negative ([place])   04/05/2017 Initial Diagnosis    Left breast asymmetry by ultrasound measured 1.3 cm at 2:30 position 10 cm from nipple, no axillary lymph nodes; biopsy IDC grade 2, ER 0%, PR 0%, HER-2 negative ratio 1.37, Ki-67 40%, T1c N0 stage IB AJCC 8       04/30/2017 Surgery    Left lumpectomy: IDC grade 3, 1.7 cm, DCIS, lymphovascular invasion present, margins negative, 0/1 lymph node negative, ER 0%, PR 0%, HER-2 negative ratio 1.37, Ki-67 40%, T1c N0 stage IB      05/22/2017 Genetic Testing    Patient had genetic testing due to a personal history of triple negative breast cancer.  The Common Hereditary Cancer Panel was ordered. The Hereditary Gene Panel offered by [facility] includes sequencing and/or deletion duplication testing of the following 46 genes: APC, ATM, AXIN2, BARD1, BMPR1A, BRCA1, BRCA2, BRIP1, CDH1, CDKN2A (p14ARF), CDKN2A (p16INK4a), CHEK2, CTNNA1, DICER1, EPCAM (Deletion/duplication testing only), GREM1 (promoter region deletion/duplication testing only), KIT, MEN1, MLH1, MSH2, MSH3, MSH6, MUTYH, NBN, NF1, NHTL1, PALB2, PDGFRA, PMS2, POLD1, POLE, PTEN, RAD50, RAD51C, RAD51D, SDHB, SDHC, SDHD, SMAD4, SMARCA4. STK11, TP53, TSC1, TSC2, and VHL.  The following genes were evaluated for sequence changes only: SDHA and HOXB13 c.251G>A variant only.    Results: No pathogenic mutations identified.  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  The date of this test report is 05/22/2017.       05/24/2017 -  Chemotherapy    Dose dense Adriamycin and Cytoxan 4 followed by Taxol weekly 12        CHIEF COMPLIANT:  Cycle 3 of dose dense Adriamycin and Cytoxan delayed due to toxicities  INTERVAL HISTORY: ABEGAIL Peters is a 61-year-old with above-mentioned history of left breast cancer treated with lumpectomy and is currently on adjuvant chemotherapy.  She was not given chemo last Friday because of severe mucositis as well as renal failure.  She was given IV fluids on Friday and Saturday and Magic mouthwash for the mucositis.  Her mucositis has resolved.  She does not have any pain or discomfort in the mouth.  She has been eating and drinking better.  REVIEW OF SYSTEMS:   Constitutional: Denies fevers, chills or abnormal weight loss Eyes: Denies blurriness of vision Ears, nose, mouth, throat, and face: Denies mucositis or sore throat Respiratory: Denies cough, dyspnea or wheezes Cardiovascular: Denies palpitation, chest discomfort Gastrointestinal:  Denies nausea, heartburn or change in bowel habits Skin: Denies abnormal skin rashes Lymphatics: Denies new lymphadenopathy or easy bruising Neurological:Denies numbness, tingling or new weaknesses Behavioral/Psych: Mood is stable, no new changes  Extremities: No lower extremity edema  All other systems were reviewed with the patient and are negative.  I have reviewed the past medical history, past surgical history, social history and family history with the patient and they are unchanged from previous note.  ALLERGIES:  is allergic to invokana [canagliflozin]; sulfa antibiotics; and ciprofloxacin.  MEDICATIONS:  Current Outpatient Medications  Medication Sig Dispense Refill  . acetaminophen (TYLENOL 8 HOUR ARTHRITIS PAIN) 650 MG CR tablet Take 650 mg by mouth every 8 (eight) hours as needed for pain.    Marland Kitchen  clotrimazole (LOTRIMIN) 1 % cream Apply 1 application topically daily as needed (for irritated/itchy skin.).     Marland Kitchen dexamethasone (DECADRON) 4 MG tablet Take 1 tablet (4 mg total) by mouth daily. Take 1 tablet daily after chemotherapy and one tablet 2  days after chemotherapy with food (Patient not taking: Reported on 06/07/2017) 8 tablet 0  . Dulaglutide (TRULICITY) 1.5 JE/5.6DJ SOPN Inject 1.5 mg into the skin every Tuesday.    . estradiol (ESTRACE) 0.1 MG/GM vaginal cream Place 1 Applicatorful vaginally 2 (two) times a week.    . fluconazole (DIFLUCAN) 200 MG tablet Take 1 tablet (200 mg total) daily by mouth. 30 tablet 0  . ibuprofen (ADVIL,MOTRIN) 800 MG tablet Take 1 tablet (800 mg total) by mouth every 8 (eight) hours as needed. 30 tablet 0  . insulin regular human CONCENTRATED (HUMULIN R U-500 KWIKPEN) 500 UNIT/ML kwikpen Inject 50-120 Units into the skin 3 (three) times daily with meals. 120 units in the morning, 50 units at lunch and 50 units at supper.    . lidocaine-prilocaine (EMLA) cream Apply to affected area once 30 g 3  . LORazepam (ATIVAN) 0.5 MG tablet Take 1 tablet (0.5 mg total) by mouth at bedtime. (Patient not taking: Reported on 06/07/2017) 30 tablet 0  . losartan (COZAAR) 100 MG tablet Take 100 mg by mouth daily.    . magic mouthwash SOLN Take 5 mLs 4 (four) times daily as needed by mouth for mouth pain. 240 mL 0  . meloxicam (MOBIC) 15 MG tablet Take 15 mg by mouth daily.    . metFORMIN (GLUCOPHAGE) 1000 MG tablet Take 1,000 mg by mouth daily.    . ondansetron (ZOFRAN) 8 MG tablet Take 1 tablet (8 mg total) by mouth 2 (two) times daily as needed. Start on the third day after chemotherapy. 30 tablet 1  . oxyCODONE (OXY IR/ROXICODONE) 5 MG immediate release tablet Take 1-2 tablets (5-10 mg total) by mouth every 6 (six) hours as needed for severe pain. (Patient not taking: Reported on 05/14/2017) 20 tablet 0  . prochlorperazine (COMPAZINE) 10 MG tablet Take 1 tablet (10 mg total) by mouth every 6 (six) hours as needed (Nausea or vomiting). 30 tablet 1  . triamterene-hydrochlorothiazide (MAXZIDE-25) 37.5-25 MG per tablet Take 1 tablet by mouth daily.      No current facility-administered medications for this visit.      PHYSICAL EXAMINATION: ECOG PERFORMANCE STATUS: 1 - Symptomatic but completely ambulatory  Vitals:   06/24/17 1049  BP: 121/64  Pulse: 87  Resp: 20  Temp: 98.4 F (36.9 C)  SpO2: 98%   Filed Weights   06/24/17 1049  Weight: 231 lb 1.6 oz (104.8 kg)    GENERAL:alert, no distress and comfortable SKIN: skin color, texture, turgor are normal, no rashes or significant lesions EYES: normal, Conjunctiva are pink and non-injected, sclera clear OROPHARYNX:no exudate, no erythema and lips, buccal mucosa, and tongue normal  NECK: supple, thyroid normal size, non-tender, without nodularity LYMPH:  no palpable lymphadenopathy in the cervical, axillary or inguinal LUNGS: clear to auscultation and percussion with normal breathing effort HEART: regular rate & rhythm and no murmurs and no lower extremity edema ABDOMEN:abdomen soft, non-tender and normal bowel sounds MUSCULOSKELETAL:no cyanosis of digits and no clubbing  NEURO: alert & oriented x 3 with fluent speech, no focal motor/sensory deficits EXTREMITIES: No lower extremity edema   LABORATORY DATA:  I have reviewed the data as listed   Chemistry      Component Value Date/Time  NA 140 06/24/2017 1024   K 4.0 06/24/2017 1024   CL 105 04/25/2017 0946   CO2 25 06/24/2017 1024   BUN 22.4 06/24/2017 1024   CREATININE 1.3 (H) 06/24/2017 1024      Component Value Date/Time   CALCIUM 8.5 06/24/2017 1024   ALKPHOS 75 06/24/2017 1024   AST 31 06/24/2017 1024   ALT 27 06/24/2017 1024   BILITOT 0.35 06/24/2017 1024       Lab Results  Component Value Date   WBC 8.0 06/24/2017   HGB 10.0 (L) 06/24/2017   HCT 31.1 (L) 06/24/2017   MCV 87.1 06/24/2017   PLT 127 (L) 06/24/2017   NEUTROABS 6.1 06/24/2017    ASSESSMENT & PLAN:  Malignant neoplasm of upper-outer quadrant of left breast in female, estrogen receptor negative ([place]) 04/30/2017: Left lumpectomy: IDC grade 3, 1.7 cm, DCIS, lymphovascular invasion present, margins  negative, 0/1 lymph node negative, ER 0%, PR 0%, HER-2 negative ratio 1.37, Ki-67 40%, T1c N0 stage IB  Pathology counseling: I discussed the final pathology report of the patient provided  a copy of this report. I discussed the margins as well as lymph node surgeries. We also discussed the final staging along with previously performed ER/PR and HER-2/neu testing.  Recommendation: 1. adjuvant chemotherapy with dose dense Adriamycin and Cytoxan 4 followed by Taxol weekly 12 ( cannot use steroids because patient is diabetic) 2. Followed by radiation --------------------------------------------------------------------------------------------------------------------------- Current treatment: Cycle 3 of dose dense Adriamycin and Cytoxan (postponed due to toxicities) Echocardiogram 04/23/2017: EF 55-60%  Chemo toxicities: 1. Mucositis: Resolved 2. Acute renal failure: Market improvement creatinine is 1.3 today 3. Anemia due to chemotherapy Alopecia We will dose reduce the cycle 3 of chemotherapy.   I spent 25 minutes talking to the patient of which more than half was spent in counseling and coordination of care.  No orders of the defined types were placed in this encounter.  The patient has a good understanding of the overall plan. she agrees with it. she will call with any problems that may develop before the next visit here.   Rulon Eisenmenger, MD 06/24/17

## 2017-06-26 ENCOUNTER — Telehealth: Payer: Self-pay | Admitting: Hematology and Oncology

## 2017-06-26 NOTE — Telephone Encounter (Signed)
Scheduled appt per 11/12 - patient is aware of next appt date and time on 11/16

## 2017-06-28 ENCOUNTER — Ambulatory Visit (HOSPITAL_BASED_OUTPATIENT_CLINIC_OR_DEPARTMENT_OTHER): Payer: BC Managed Care – PPO | Admitting: Hematology and Oncology

## 2017-06-28 ENCOUNTER — Ambulatory Visit (HOSPITAL_BASED_OUTPATIENT_CLINIC_OR_DEPARTMENT_OTHER): Payer: BC Managed Care – PPO

## 2017-06-28 ENCOUNTER — Encounter: Payer: Self-pay | Admitting: *Deleted

## 2017-06-28 ENCOUNTER — Other Ambulatory Visit (HOSPITAL_BASED_OUTPATIENT_CLINIC_OR_DEPARTMENT_OTHER): Payer: BC Managed Care – PPO

## 2017-06-28 DIAGNOSIS — Z171 Estrogen receptor negative status [ER-]: Principal | ICD-10-CM

## 2017-06-28 DIAGNOSIS — C50412 Malignant neoplasm of upper-outer quadrant of left female breast: Secondary | ICD-10-CM

## 2017-06-28 DIAGNOSIS — N179 Acute kidney failure, unspecified: Secondary | ICD-10-CM | POA: Diagnosis not present

## 2017-06-28 DIAGNOSIS — L658 Other specified nonscarring hair loss: Secondary | ICD-10-CM | POA: Diagnosis not present

## 2017-06-28 DIAGNOSIS — E119 Type 2 diabetes mellitus without complications: Secondary | ICD-10-CM

## 2017-06-28 DIAGNOSIS — D6481 Anemia due to antineoplastic chemotherapy: Secondary | ICD-10-CM

## 2017-06-28 DIAGNOSIS — Z5111 Encounter for antineoplastic chemotherapy: Secondary | ICD-10-CM

## 2017-06-28 LAB — CBC WITH DIFFERENTIAL/PLATELET
BASO%: 0.6 % (ref 0.0–2.0)
Basophils Absolute: 0 10*3/uL (ref 0.0–0.1)
EOS%: 0.2 % (ref 0.0–7.0)
Eosinophils Absolute: 0 10*3/uL (ref 0.0–0.5)
HEMATOCRIT: 30.6 % — AB (ref 34.8–46.6)
HEMOGLOBIN: 9.9 g/dL — AB (ref 11.6–15.9)
LYMPH%: 17.5 % (ref 14.0–49.7)
MCH: 27.9 pg (ref 25.1–34.0)
MCHC: 32.3 g/dL (ref 31.5–36.0)
MCV: 86.5 fL (ref 79.5–101.0)
MONO#: 0.8 10*3/uL (ref 0.1–0.9)
MONO%: 12.1 % (ref 0.0–14.0)
NEUT%: 69.6 % (ref 38.4–76.8)
NEUTROS ABS: 4.9 10*3/uL (ref 1.5–6.5)
Platelets: 199 10*3/uL (ref 145–400)
RBC: 3.54 10*6/uL — AB (ref 3.70–5.45)
RDW: 13.3 % (ref 11.2–14.5)
WBC: 7 10*3/uL (ref 3.9–10.3)
lymph#: 1.2 10*3/uL (ref 0.9–3.3)

## 2017-06-28 LAB — COMPREHENSIVE METABOLIC PANEL
ALBUMIN: 3.3 g/dL — AB (ref 3.5–5.0)
ALK PHOS: 69 U/L (ref 40–150)
ALT: 39 U/L (ref 0–55)
AST: 55 U/L — ABNORMAL HIGH (ref 5–34)
Anion Gap: 10 mEq/L (ref 3–11)
BUN: 12.1 mg/dL (ref 7.0–26.0)
CALCIUM: 8.7 mg/dL (ref 8.4–10.4)
CO2: 23 mEq/L (ref 22–29)
CREATININE: 1.1 mg/dL (ref 0.6–1.1)
Chloride: 105 mEq/L (ref 98–109)
EGFR: >60 mL/min/{1.73_m2} (ref >60)
Glucose: 239 mg/dl — ABNORMAL HIGH (ref 70–140)
POTASSIUM: 4 meq/L (ref 3.5–5.1)
Sodium: 138 mEq/L (ref 136–145)
Total Bilirubin: 0.31 mg/dL (ref 0.20–1.20)
Total Protein: 6.8 g/dL (ref 6.4–8.3)

## 2017-06-28 MED ORDER — SODIUM CHLORIDE 0.9 % IV SOLN
Freq: Once | INTRAVENOUS | Status: AC
  Administered 2017-06-28: 14:00:00 via INTRAVENOUS

## 2017-06-28 MED ORDER — SODIUM CHLORIDE 0.9 % IV SOLN
Freq: Once | INTRAVENOUS | Status: AC
  Administered 2017-06-28: 14:00:00 via INTRAVENOUS
  Filled 2017-06-28: qty 5

## 2017-06-28 MED ORDER — CYCLOPHOSPHAMIDE CHEMO INJECTION 1 GM
500.0000 mg/m2 | Freq: Once | INTRAMUSCULAR | Status: AC
  Administered 2017-06-28: 1120 mg via INTRAVENOUS
  Filled 2017-06-28: qty 56

## 2017-06-28 MED ORDER — PEGFILGRASTIM 6 MG/0.6ML ~~LOC~~ PSKT
6.0000 mg | PREFILLED_SYRINGE | Freq: Once | SUBCUTANEOUS | Status: AC
  Administered 2017-06-28: 6 mg via SUBCUTANEOUS
  Filled 2017-06-28: qty 0.6

## 2017-06-28 MED ORDER — PALONOSETRON HCL INJECTION 0.25 MG/5ML
0.2500 mg | Freq: Once | INTRAVENOUS | Status: AC
  Administered 2017-06-28: 0.25 mg via INTRAVENOUS

## 2017-06-28 MED ORDER — DOXORUBICIN HCL CHEMO IV INJECTION 2 MG/ML
50.0000 mg/m2 | Freq: Once | INTRAVENOUS | Status: AC
  Administered 2017-06-28: 112 mg via INTRAVENOUS
  Filled 2017-06-28: qty 56

## 2017-06-28 MED ORDER — PALONOSETRON HCL INJECTION 0.25 MG/5ML
INTRAVENOUS | Status: AC
  Filled 2017-06-28: qty 5

## 2017-06-28 NOTE — Progress Notes (Signed)
Patient Care Team: [name], MD as PCP - General (Internal Medicine)  DIAGNOSIS:  Encounter Diagnosis  Name Primary?  . Malignant neoplasm of upper-outer quadrant of left breast in female, estrogen receptor negative ([place])     SUMMARY OF ONCOLOGIC HISTORY:   Malignant neoplasm of upper-outer quadrant of left breast in female, estrogen receptor negative ([place])   04/05/2017 Initial Diagnosis    Left breast asymmetry by ultrasound measured 1.3 cm at 2:30 position 10 cm from nipple, no axillary lymph nodes; biopsy IDC grade 2, ER 0%, PR 0%, HER-2 negative ratio 1.37, Ki-67 40%, T1c N0 stage IB AJCC 8       04/30/2017 Surgery    Left lumpectomy: IDC grade 3, 1.7 cm, DCIS, lymphovascular invasion present, margins negative, 0/1 lymph node negative, ER 0%, PR 0%, HER-2 negative ratio 1.37, Ki-67 40%, T1c N0 stage IB      05/22/2017 Genetic Testing    Patient had genetic testing due to a personal history of triple negative breast cancer.  The Common Hereditary Cancer Panel was ordered. The Hereditary Gene Panel offered by [facility] includes sequencing and/or deletion duplication testing of the following 46 genes: APC, ATM, AXIN2, BARD1, BMPR1A, BRCA1, BRCA2, BRIP1, CDH1, CDKN2A (p14ARF), CDKN2A (p16INK4a), CHEK2, CTNNA1, DICER1, EPCAM (Deletion/duplication testing only), GREM1 (promoter region deletion/duplication testing only), KIT, MEN1, MLH1, MSH2, MSH3, MSH6, MUTYH, NBN, NF1, NHTL1, PALB2, PDGFRA, PMS2, POLD1, POLE, PTEN, RAD50, RAD51C, RAD51D, SDHB, SDHC, SDHD, SMAD4, SMARCA4. STK11, TP53, TSC1, TSC2, and VHL.  The following genes were evaluated for sequence changes only: SDHA and HOXB13 c.251G>A variant only.    Results: No pathogenic mutations identified.  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  The date of this test report is 05/22/2017.       05/24/2017 -  Chemotherapy    Dose dense Adriamycin and Cytoxan 4 followed by Taxol weekly 12        CHIEF COMPLIANT:  Cycle 3 dose dense Adriamycin and Cytoxan  INTERVAL HISTORY: Audrey Peters is a 61 year old with above-mentioned history of breast cancer is currently on adjuvant chemotherapy with dose dense Adriamycin and Cytoxan.  Today is cycle 3 of treatment.  This was postponed from last week.  She has recovered from the thrush as well as a renal failure.  She is eating and drinking quite well.  Energy levels are better.  Oral thrush has resolved.  REVIEW OF SYSTEMS:   Constitutional: Denies fevers, chills or abnormal weight loss Eyes: Denies blurriness of vision Ears, nose, mouth, throat, and face: Denies mucositis or sore throat Respiratory: Denies cough, dyspnea or wheezes Cardiovascular: Denies palpitation, chest discomfort Gastrointestinal:  Denies nausea, heartburn or change in bowel habits Skin: Denies abnormal skin rashes Lymphatics: Denies new lymphadenopathy or easy bruising Neurological:Denies numbness, tingling or new weaknesses Behavioral/Psych: Mood is stable, no new changes  Extremities: No lower extremity edema  All other systems were reviewed with the patient and are negative.  I have reviewed the past medical history, past surgical history, social history and family history with the patient and they are unchanged from previous note.  ALLERGIES:  is allergic to invokana [canagliflozin]; sulfa antibiotics; and ciprofloxacin.  MEDICATIONS:  Current Outpatient Medications  Medication Sig Dispense Refill  . acetaminophen (TYLENOL 8 HOUR ARTHRITIS PAIN) 650 MG CR tablet Take 650 mg by mouth every 8 (eight) hours as needed for pain.    . clotrimazole (LOTRIMIN) 1 % cream Apply 1 application topically daily as needed (for irritated/itchy skin.).     Marland Kitchen  dexamethasone (DECADRON) 4 MG tablet Take 1 tablet (4 mg total) by mouth daily. Take 1 tablet daily after chemotherapy and one tablet 2 days after chemotherapy with food (Patient not taking: Reported on 06/07/2017) 8 tablet 0  .  Dulaglutide (TRULICITY) 1.5 WU/1.3KG SOPN Inject 1.5 mg into the skin every Tuesday.    . estradiol (ESTRACE) 0.1 MG/GM vaginal cream Place 1 Applicatorful vaginally 2 (two) times a week.    . fluconazole (DIFLUCAN) 200 MG tablet Take 1 tablet (200 mg total) daily by mouth. 30 tablet 0  . ibuprofen (ADVIL,MOTRIN) 800 MG tablet Take 1 tablet (800 mg total) by mouth every 8 (eight) hours as needed. 30 tablet 0  . insulin regular human CONCENTRATED (HUMULIN R U-500 KWIKPEN) 500 UNIT/ML kwikpen Inject 50-120 Units into the skin 3 (three) times daily with meals. 120 units in the morning, 50 units at lunch and 50 units at supper.    . lidocaine-prilocaine (EMLA) cream Apply to affected area once 30 g 3  . LORazepam (ATIVAN) 0.5 MG tablet Take 1 tablet (0.5 mg total) by mouth at bedtime. (Patient not taking: Reported on 06/07/2017) 30 tablet 0  . losartan (COZAAR) 100 MG tablet Take 100 mg by mouth daily.    . magic mouthwash SOLN Take 5 mLs 4 (four) times daily as needed by mouth for mouth pain. 240 mL 0  . meloxicam (MOBIC) 15 MG tablet Take 15 mg by mouth daily.    . metFORMIN (GLUCOPHAGE) 1000 MG tablet Take 1,000 mg by mouth daily.    . ondansetron (ZOFRAN) 8 MG tablet Take 1 tablet (8 mg total) by mouth 2 (two) times daily as needed. Start on the third day after chemotherapy. 30 tablet 1  . oxyCODONE (OXY IR/ROXICODONE) 5 MG immediate release tablet Take 1-2 tablets (5-10 mg total) by mouth every 6 (six) hours as needed for severe pain. (Patient not taking: Reported on 05/14/2017) 20 tablet 0  . prochlorperazine (COMPAZINE) 10 MG tablet Take 1 tablet (10 mg total) by mouth every 6 (six) hours as needed (Nausea or vomiting). 30 tablet 1  . triamterene-hydrochlorothiazide (MAXZIDE-25) 37.5-25 MG per tablet Take 1 tablet by mouth daily.      No current facility-administered medications for this visit.     PHYSICAL EXAMINATION: ECOG PERFORMANCE STATUS: 1 - Symptomatic but completely  ambulatory  Vitals:   06/28/17 1153  BP: (!) 111/48  Pulse: 88  Resp: 18  Temp: 97.9 F (36.6 C)  SpO2: 100%   Filed Weights   06/28/17 1153  Weight: 230 lb 4.8 oz (104.5 kg)    GENERAL:alert, no distress and comfortable SKIN: skin color, texture, turgor are normal, no rashes or significant lesions EYES: normal, Conjunctiva are pink and non-injected, sclera clear OROPHARYNX:no exudate, no erythema and lips, buccal mucosa, and tongue normal  NECK: supple, thyroid normal size, non-tender, without nodularity LYMPH:  no palpable lymphadenopathy in the cervical, axillary or inguinal LUNGS: clear to auscultation and percussion with normal breathing effort HEART: regular rate & rhythm and no murmurs and no lower extremity edema ABDOMEN:abdomen soft, non-tender and normal bowel sounds MUSCULOSKELETAL:no cyanosis of digits and no clubbing  NEURO: alert & oriented x 3 with fluent speech, no focal motor/sensory deficits EXTREMITIES: No lower extremity edema  LABORATORY DATA:  I have reviewed the data as listed   Chemistry      Component Value Date/Time   NA 138 06/28/2017 1111   K 4.0 06/28/2017 1111   CL 105 04/25/2017 0946  CO2 23 06/28/2017 1111   BUN 12.1 06/28/2017 1111   CREATININE 1.1 06/28/2017 1111      Component Value Date/Time   CALCIUM 8.7 06/28/2017 1111   ALKPHOS 69 06/28/2017 1111   AST 55 (H) 06/28/2017 1111   ALT 39 06/28/2017 1111   BILITOT 0.31 06/28/2017 1111       Lab Results  Component Value Date   WBC 7.0 06/28/2017   HGB 9.9 (L) 06/28/2017   HCT 30.6 (L) 06/28/2017   MCV 86.5 06/28/2017   PLT 199 06/28/2017   NEUTROABS 4.9 06/28/2017    ASSESSMENT & PLAN:  Malignant neoplasm of upper-outer quadrant of left breast in female, estrogen receptor negative ([place]) 04/30/2017: Left lumpectomy: IDC grade 3, 1.7 cm, DCIS, lymphovascular invasion present, margins negative, 0/1 lymph node negative, ER 0%, PR 0%, HER-2 negative ratio 1.37, Ki-67 40%, T1c  N0 stage IB  Pathology counseling: I discussed the final pathology report of the patient provided  a copy of this report. I discussed the margins as well as lymph node surgeries. We also discussed the final staging along with previously performed ER/PR and HER-2/neu testing.  Recommendation: 1. adjuvant chemotherapy with dose dense Adriamycin and Cytoxan 4 followed by Taxol weekly 12 ( cannot use steroids because patient is diabetic) 2. Followed by radiation --------------------------------------------------------------------------------------------------------------------------- Current treatment: Cycle 3 of dose dense Adriamycin and Cytoxan (postponed due to toxicities) Echocardiogram 04/23/2017: EF 55-60%  Chemo toxicities: 1. Mucositis: Resolved 2. Acute renal failure: Market improvement creatinine is 1.3 today 3. Anemia due to chemotherapy: Hemoglobin is stable at 9.9 4. Alopecia We reduced the dosage of cycle 3 of chemotherapy.  Return to clinic in 2 weeks for cycle 4  I spent 25 minutes talking to the patient of which more than half was spent in counseling and coordination of care.  No orders of the defined types were placed in this encounter.  The patient has a good understanding of the overall plan. she agrees with it. she will call with any problems that may develop before the next visit here.   Rulon Eisenmenger, MD 06/28/17

## 2017-06-28 NOTE — Patient Instructions (Signed)
[facility] Discharge Instructions for Patients Receiving Chemotherapy  Today you received the following chemotherapy agents Adriamycin and Cytoxan  To help prevent nausea and vomiting after your treatment, we encourage you to take your nausea medication as directed.  If you develop nausea and vomiting that is not controlled by your nausea medication, call the clinic.   BELOW ARE SYMPTOMS THAT SHOULD BE REPORTED IMMEDIATELY:  *FEVER GREATER THAN 100.5 F  *CHILLS WITH OR WITHOUT FEVER  NAUSEA AND VOMITING THAT IS NOT CONTROLLED WITH YOUR NAUSEA MEDICATION  *UNUSUAL SHORTNESS OF BREATH  *UNUSUAL BRUISING OR BLEEDING  TENDERNESS IN MOUTH AND THROAT WITH OR WITHOUT PRESENCE OF ULCERS  *URINARY PROBLEMS  *BOWEL PROBLEMS  UNUSUAL RASH Items with * indicate a potential emergency and should be followed up as soon as possible.  Feel free to call the clinic should you have any questions or concerns. The clinic phone number is [phone].  Please show the CHEMO ALERT CARD at check-in to the Emergency Department and triage nurse.   

## 2017-06-28 NOTE — Assessment & Plan Note (Signed)
04/30/2017: Left lumpectomy: IDC grade 3, 1.7 cm, DCIS, lymphovascular invasion present, margins negative, 0/1 lymph node negative, ER 0%, PR 0%, HER-2 negative ratio 1.37, Ki-67 40%, T1c N0 stage IB  Pathology counseling: I discussed the final pathology report of the patient provided  a copy of this report. I discussed the margins as well as lymph node surgeries. We also discussed the final staging along with previously performed ER/PR and HER-2/neu testing.  Recommendation: 1. adjuvant chemotherapy with dose dense Adriamycin and Cytoxan 4 followed by Taxol weekly 12 ( cannot use steroids because patient is diabetic) 2. Followed by radiation --------------------------------------------------------------------------------------------------------------------------- Current treatment: Cycle 3 of dose dense Adriamycin and Cytoxan (postponed due to toxicities) Echocardiogram 04/23/2017: EF 55-60%  Chemo toxicities: 1. Mucositis: Resolved 2. Acute renal failure: Market improvement creatinine is 1.3 today 3. Anemia due to chemotherapy Alopecia We will dose reduce the cycle 3 of chemotherapy. 

## 2017-07-05 ENCOUNTER — Ambulatory Visit: Payer: BC Managed Care – PPO

## 2017-07-05 ENCOUNTER — Ambulatory Visit: Payer: BC Managed Care – PPO | Admitting: Adult Health

## 2017-07-05 ENCOUNTER — Other Ambulatory Visit: Payer: BC Managed Care – PPO

## 2017-07-05 ENCOUNTER — Telehealth: Payer: Self-pay | Admitting: *Deleted

## 2017-07-05 DIAGNOSIS — Z171 Estrogen receptor negative status [ER-]: Principal | ICD-10-CM

## 2017-07-05 DIAGNOSIS — C50412 Malignant neoplasm of upper-outer quadrant of left female breast: Secondary | ICD-10-CM

## 2017-07-05 MED ORDER — ONDANSETRON HCL 8 MG PO TABS: 8.0000 mg | ORAL_TABLET | Freq: Two times a day (BID) | ORAL | 1 refills | Status: DC | PRN

## 2017-07-05 NOTE — Telephone Encounter (Signed)
This RN spoke with pt in the lobby per concern due to onset over the past 4 days of AM nausea with vomiting and her request for a refill on the Zofran.  " it's like after I take my morning pills  - I get nauseated and then vomit"  She takes her medication with apple sauce or orange juice.  She states vomit is yellow and liquidy- she denies seeing any medication in the emesis.  Per above- refill will be given with instructions to take at bedtime ( medication last 12 hours ) to prevent issues of above in the AM with request to take every day for the next 3 days- she is to call on Monday with update and further instructions. Discussed possible constipation secondary to daily use of zofran.  Also advised pt to take morning meds with milk for possible better tolerance vs orange juice.  Audrey Peters verbalized understanding. She is scheduled for visit next week with [facility]/NP.

## 2017-07-12 ENCOUNTER — Other Ambulatory Visit: Payer: Self-pay

## 2017-07-12 ENCOUNTER — Other Ambulatory Visit (HOSPITAL_BASED_OUTPATIENT_CLINIC_OR_DEPARTMENT_OTHER): Payer: BC Managed Care – PPO

## 2017-07-12 ENCOUNTER — Ambulatory Visit (HOSPITAL_BASED_OUTPATIENT_CLINIC_OR_DEPARTMENT_OTHER): Payer: BC Managed Care – PPO

## 2017-07-12 ENCOUNTER — Ambulatory Visit (HOSPITAL_BASED_OUTPATIENT_CLINIC_OR_DEPARTMENT_OTHER): Payer: BC Managed Care – PPO | Admitting: Adult Health

## 2017-07-12 ENCOUNTER — Encounter: Payer: Self-pay | Admitting: Adult Health

## 2017-07-12 ENCOUNTER — Ambulatory Visit: Payer: BC Managed Care – PPO

## 2017-07-12 VITALS — BP 103/56 | HR 96 | Temp 97.8°F | Resp 20 | Ht 65.0 in | Wt 222.4 lb

## 2017-07-12 DIAGNOSIS — Z171 Estrogen receptor negative status [ER-]: Secondary | ICD-10-CM | POA: Diagnosis not present

## 2017-07-12 DIAGNOSIS — C50412 Malignant neoplasm of upper-outer quadrant of left female breast: Secondary | ICD-10-CM

## 2017-07-12 DIAGNOSIS — Z5111 Encounter for antineoplastic chemotherapy: Secondary | ICD-10-CM | POA: Diagnosis not present

## 2017-07-12 DIAGNOSIS — Z95828 Presence of other vascular implants and grafts: Secondary | ICD-10-CM

## 2017-07-12 DIAGNOSIS — R5383 Other fatigue: Secondary | ICD-10-CM | POA: Diagnosis not present

## 2017-07-12 LAB — CBC WITH DIFFERENTIAL/PLATELET
BASO%: 0.1 % (ref 0.0–2.0)
Basophils Absolute: 0 10*3/uL (ref 0.0–0.1)
EOS ABS: 0.1 10*3/uL (ref 0.0–0.5)
EOS%: 0.5 % (ref 0.0–7.0)
HCT: 28.6 % — ABNORMAL LOW (ref 34.8–46.6)
HGB: 9.3 g/dL — ABNORMAL LOW (ref 11.6–15.9)
LYMPH%: 12.4 % — AB (ref 14.0–49.7)
MCH: 28.4 pg (ref 25.1–34.0)
MCHC: 32.5 g/dL (ref 31.5–36.0)
MCV: 87.2 fL (ref 79.5–101.0)
MONO#: 1.3 10*3/uL — AB (ref 0.1–0.9)
MONO%: 13.8 % (ref 0.0–14.0)
NEUT#: 7.1 10*3/uL — ABNORMAL HIGH (ref 1.5–6.5)
NEUT%: 73.2 % (ref 38.4–76.8)
PLATELETS: 120 10*3/uL — AB (ref 145–400)
RBC: 3.28 10*6/uL — AB (ref 3.70–5.45)
RDW: 14.3 % (ref 11.2–14.5)
WBC: 9.7 10*3/uL (ref 3.9–10.3)
lymph#: 1.2 10*3/uL (ref 0.9–3.3)

## 2017-07-12 LAB — COMPREHENSIVE METABOLIC PANEL
ALT: 46 U/L (ref 0–55)
ANION GAP: 10 meq/L (ref 3–11)
AST: 63 U/L — ABNORMAL HIGH (ref 5–34)
Albumin: 3.5 g/dL (ref 3.5–5.0)
Alkaline Phosphatase: 83 U/L (ref 40–150)
BUN: 22.6 mg/dL (ref 7.0–26.0)
CHLORIDE: 106 meq/L (ref 98–109)
CO2: 22 meq/L (ref 22–29)
Calcium: 8.9 mg/dL (ref 8.4–10.4)
Creatinine: 1.3 mg/dL — ABNORMAL HIGH (ref 0.6–1.1)
EGFR: 50 mL/min/{1.73_m2} — AB (ref >60)
Glucose: 137 mg/dl (ref 70–140)
Potassium: 3.7 mEq/L (ref 3.5–5.1)
Sodium: 138 mEq/L (ref 136–145)
Total Bilirubin: <0.22 mg/dL (ref 0.20–1.20)
Total Protein: 7.1 g/dL (ref 6.4–8.3)

## 2017-07-12 MED ORDER — DOXORUBICIN HCL CHEMO IV INJECTION 2 MG/ML
50.0000 mg/m2 | Freq: Once | INTRAVENOUS | Status: AC
  Administered 2017-07-12: 112 mg via INTRAVENOUS
  Filled 2017-07-12: qty 56

## 2017-07-12 MED ORDER — SODIUM CHLORIDE 0.9 % IV SOLN
Freq: Once | INTRAVENOUS | Status: AC
  Administered 2017-07-12: 15:00:00 via INTRAVENOUS
  Filled 2017-07-12: qty 5

## 2017-07-12 MED ORDER — HEPARIN SOD (PORK) LOCK FLUSH 100 UNIT/ML IV SOLN
500.0000 [IU] | Freq: Once | INTRAVENOUS | Status: AC | PRN
  Administered 2017-07-12: 500 [IU]
  Filled 2017-07-12: qty 5

## 2017-07-12 MED ORDER — PALONOSETRON HCL INJECTION 0.25 MG/5ML
0.2500 mg | Freq: Once | INTRAVENOUS | Status: AC
  Administered 2017-07-12: 0.25 mg via INTRAVENOUS

## 2017-07-12 MED ORDER — PEGFILGRASTIM 6 MG/0.6ML ~~LOC~~ PSKT
6.0000 mg | PREFILLED_SYRINGE | Freq: Once | SUBCUTANEOUS | Status: AC
  Administered 2017-07-12: 6 mg via SUBCUTANEOUS
  Filled 2017-07-12: qty 0.6

## 2017-07-12 MED ORDER — SODIUM CHLORIDE 0.9 % IV SOLN
Freq: Once | INTRAVENOUS | Status: AC
  Administered 2017-07-12: 15:00:00 via INTRAVENOUS

## 2017-07-12 MED ORDER — SODIUM CHLORIDE 0.9 % IV SOLN
500.0000 mg/m2 | Freq: Once | INTRAVENOUS | Status: AC
  Administered 2017-07-12: 1120 mg via INTRAVENOUS
  Filled 2017-07-12: qty 56

## 2017-07-12 MED ORDER — SODIUM CHLORIDE 0.9% FLUSH
10.0000 mL | INTRAVENOUS | Status: DC | PRN
  Administered 2017-07-12: 10 mL via INTRAVENOUS
  Filled 2017-07-12: qty 10

## 2017-07-12 MED ORDER — SODIUM CHLORIDE 0.9% FLUSH
10.0000 mL | INTRAVENOUS | Status: DC | PRN
  Administered 2017-07-12: 10 mL
  Filled 2017-07-12: qty 10

## 2017-07-12 MED ORDER — PALONOSETRON HCL INJECTION 0.25 MG/5ML
INTRAVENOUS | Status: AC
  Filled 2017-07-12: qty 5

## 2017-07-12 NOTE — Patient Instructions (Signed)
[facility] Discharge Instructions for Patients Receiving Chemotherapy  Today you received the following chemotherapy agents Adriamycin and Cytoxan  To help prevent nausea and vomiting after your treatment, we encourage you to take your nausea medication as directed.  If you develop nausea and vomiting that is not controlled by your nausea medication, call the clinic.   BELOW ARE SYMPTOMS THAT SHOULD BE REPORTED IMMEDIATELY:  *FEVER GREATER THAN 100.5 F  *CHILLS WITH OR WITHOUT FEVER  NAUSEA AND VOMITING THAT IS NOT CONTROLLED WITH YOUR NAUSEA MEDICATION  *UNUSUAL SHORTNESS OF BREATH  *UNUSUAL BRUISING OR BLEEDING  TENDERNESS IN MOUTH AND THROAT WITH OR WITHOUT PRESENCE OF ULCERS  *URINARY PROBLEMS  *BOWEL PROBLEMS  UNUSUAL RASH Items with * indicate a potential emergency and should be followed up as soon as possible.  Feel free to call the clinic should you have any questions or concerns. The clinic phone number is [phone].  Please show the CHEMO ALERT CARD at check-in to the Emergency Department and triage nurse.   

## 2017-07-12 NOTE — Assessment & Plan Note (Signed)
04/30/2017: Left lumpectomy: IDC grade 3, 1.7 cm, DCIS, lymphovascular invasion present, margins negative, 0/1 lymph node negative, ER 0%, PR 0%, HER-2 negative ratio 1.37, Ki-67 40%, T1c N0 stage IB  Pathology counseling: I discussed the final pathology report of the patient provided  a copy of this report. I discussed the margins as well as lymph node surgeries. We also discussed the final staging along with previously performed ER/PR and HER-2/neu testing.  Recommendation: 1. adjuvant chemotherapy with dose dense Adriamycin and Cytoxan 4 followed by Taxol weekly 12 ( cannot use steroids because patient is diabetic) 2. Followed by radiation --------------------------------------------------------------------------------------------------------------------------- Current treatment: Cycle 4 of dose dense Adriamycin and Cytoxan (postponed due to toxicities) Echocardiogram 04/23/2017: EF 55-60%  Audrey Peters is not in good spirits today.  She will proceed with treatment.  She and I reviewed that the Taxol, which is the next regimen is better tolerated and will likely be much easier for her.  She is relieved to hear this.  Though still tearful, I explained to her that if she feels depressed, that she is crying too much, or has lost enjoyment in things, to please let me know or Dr. Lindi Adie know and we can prescribe an anti depressant for her to help her through the next few months.  She declined at this point.  She will return in 2 weeks for labs, f/u and Taxol.

## 2017-07-12 NOTE — Progress Notes (Signed)
Audrey Peters:    [name], MD [address] [place] [address]   DIAGNOSIS: Cancer Staging Malignant neoplasm of upper-outer quadrant of left breast in female, estrogen receptor negative ([place]) Staging form: Breast, AJCC 8th Edition - Clinical: No stage assigned - Unsigned - Pathologic: Stage IB (pT1c, pN0, cM0, G3, ER: Negative, PR: Negative, HER2: Negative) - Signed by Eppie Gibson, MD on 05/14/2017   SUMMARY OF ONCOLOGIC HISTORY:   Malignant neoplasm of upper-outer quadrant of left breast in female, estrogen receptor negative ([place])   04/05/2017 Initial Diagnosis    Left breast asymmetry by ultrasound measured 1.3 cm at 2:30 position 10 cm from nipple, no axillary lymph nodes; biopsy IDC grade 2, ER 0%, PR 0%, HER-2 negative ratio 1.37, Ki-67 40%, T1c N0 stage IB AJCC 8       04/30/2017 Surgery    Left lumpectomy: IDC grade 3, 1.7 cm, DCIS, lymphovascular invasion present, margins negative, 0/1 lymph node negative, ER 0%, PR 0%, HER-2 negative ratio 1.37, Ki-67 40%, T1c N0 stage IB      05/22/2017 Genetic Testing    Patient had genetic testing due to a personal history of triple negative breast cancer.  The Common Hereditary Cancer Panel was ordered. The Hereditary Gene Panel offered by [facility] includes sequencing and/or deletion duplication testing of the following 46 genes: APC, ATM, AXIN2, BARD1, BMPR1A, BRCA1, BRCA2, BRIP1, CDH1, CDKN2A (p14ARF), CDKN2A (p16INK4a), CHEK2, CTNNA1, DICER1, EPCAM (Deletion/duplication testing only), GREM1 (promoter region deletion/duplication testing only), KIT, MEN1, MLH1, MSH2, MSH3, MSH6, MUTYH, NBN, NF1, NHTL1, PALB2, PDGFRA, PMS2, POLD1, POLE, PTEN, RAD50, RAD51C, RAD51D, SDHB, SDHC, SDHD, SMAD4, SMARCA4. STK11, TP53, TSC1, TSC2, and VHL.  The following genes were evaluated for sequence changes only: SDHA and HOXB13 c.251G>A variant only.    Results: No pathogenic mutations identified.  A VUS in  ATM c.4279G>A (p.Ala1427Thr) was identified.  The date of this test report is 05/22/2017.       05/24/2017 -  Chemotherapy    Dose dense Adriamycin and Cytoxan 4 followed by Taxol weekly 12        CURRENT THERAPY: Adriamycin/Cytoxan cycle 4  INTERVAL HISTORY: Audrey Peters 61 y.o. female returns for evaluation prior to her fourth cycle of Adriamycin/Cytoxan.  She is tearful today.  She is fatigued.  She says she is trying to remain positive, but some days it is hard for her.  She is having some mild nausea and occasional vomiting.  She is taking her anti emetics as prescribed.    Patient Active Problem List   Diagnosis Date Noted  . Encounter for antineoplastic chemotherapy 06/07/2017  . Port-A-Cath in place 05/24/2017  . Genetic testing 05/23/2017  . Malignant neoplasm of upper-outer quadrant of left breast in female, estrogen receptor negative ([name]) 04/22/2017  . Essential hypertension 01/30/2015  . Diabetes mellitus ([name]) 01/30/2015    is allergic to invokana [canagliflozin]; sulfa antibiotics; and ciprofloxacin.  MEDICAL HISTORY: Past Medical History:  Diagnosis Date  . Anemia yrs ago  . Arthritis   . Breast cancer ([name])   . Cancer [facility])    recent dx in breast  . Carpal tunnel syndrome of right wrist   . Diabetes mellitus without complication ([name])    dx 2008  . Headache    sinus  . Hypertension   . Personal history of chemotherapy   . Sleep apnea    does not use cpap  . Vaginal delivery [phone]  SURGICAL HISTORY: Past Surgical History:  Procedure Laterality Date  . BREAST LUMPECTOMY Left   . BREAST LUMPECTOMY WITH RADIOACTIVE SEED AND SENTINEL LYMPH NODE BIOPSY Left 04/30/2017   Procedure: LEFT BREAST LUMPECTOMY WITH RADIOACTIVE SEED AND LEFT SENTINEL LYMPH NODE BIOPSY ERAS PATHWAY;  Surgeon: Erroll Luna, MD;  Location: [place];  Service: General;  Laterality: Left;  . COLONOSCOPY WITH PROPOFOL N/A 05/28/2016   Procedure: COLONOSCOPY WITH PROPOFOL;   Surgeon: Garlan Fair, MD;  Location: [facility];  Service: Endoscopy;  Laterality: N/A;  . DILATION AND CURETTAGE OF UTERUS    . HYSTEROSCOPY W/D&C N/A 07/21/2015   Procedure: DILATATION AND CURETTAGE /HYSTEROSCOPY with myosure;  Surgeon: Janyth Pupa, DO;  Location: [facility];  Service: Gynecology;  Laterality: N/A;  . PORTACATH PLACEMENT Right 04/30/2017   Procedure: INSERTION PORT-A-CATH;  Surgeon: Erroll Luna, MD;  Location: [facility];  Service: General;  Laterality: Right;    SOCIAL HISTORY: Social History   Socioeconomic History  . Marital status: Married    Spouse name: Not on file  . Number of children: Not on file  . Years of education: Not on file  . Highest education level: Not on file  Social Needs  . Financial resource strain: Not on file  . Food insecurity - worry: Not on file  . Food insecurity - inability: Not on file  . Transportation needs - medical: Not on file  . Transportation needs - non-medical: Not on file  Occupational History  . Not on file  Tobacco Use  . Smoking status: Never Smoker  . Smokeless tobacco: Never Used  Substance and Sexual Activity  . Alcohol use: Yes    Alcohol/week: 0.0 oz    Comment: occ wine  . Drug use: No  . Sexual activity: Not on file  Other Topics Concern  . Not on file  Social History Narrative  . Not on file    FAMILY HISTORY: Family History  Problem Relation Age of Onset  . Diabetes Mother   . Hypertension Mother     Review of Systems  Constitutional: Negative for appetite change, chills, fatigue, fever and unexpected weight change.  HENT:   Negative for hearing loss and lump/mass.   Eyes: Negative for eye problems and icterus.  Respiratory: Negative for chest tightness, cough and shortness of breath.   Cardiovascular: Negative for chest pain, leg swelling and palpitations.  Gastrointestinal: Negative for abdominal distention, abdominal pain, blood in stool, constipation, diarrhea, nausea and vomiting.   Endocrine: Negative for hot flashes.  Genitourinary: Negative for difficulty urinating.   Musculoskeletal: Negative for arthralgias.  Skin: Negative for itching and rash.  Neurological: Negative for dizziness, extremity weakness, headaches and numbness.  Hematological: Negative for adenopathy. Does not bruise/bleed easily.  Psychiatric/Behavioral: Negative for depression. The patient is not nervous/anxious.       PHYSICAL EXAMINATION  ECOG PERFORMANCE STATUS: 1 - Symptomatic but completely ambulatory  Vitals:   07/12/17 1331  BP: (!) 103/56  Pulse: 96  Resp: 20  Temp: 97.8 F (36.6 C)  SpO2: 100%    Physical Exam  Constitutional: She is oriented to person, place, and time and well-developed, well-nourished, and in no distress.  HENT:  Head: Normocephalic and atraumatic.  Mouth/Throat: Oropharynx is clear and moist. No oropharyngeal exudate.  Eyes: Pupils are equal, round, and reactive to light. No scleral icterus.  Neck: Neck supple.  Cardiovascular: Normal rate, regular rhythm and normal heart sounds.  Pulmonary/Chest: Effort normal and breath sounds normal.  Abdominal: Soft.  Bowel sounds are normal. She exhibits no distension. There is no tenderness. There is no rebound and no guarding.  Musculoskeletal: She exhibits no edema.  Lymphadenopathy:    She has no cervical adenopathy.  Neurological: She is alert and oriented to person, place, and time.  Skin: Skin is warm and dry. No rash noted.  Psychiatric:  Flat affect, tearful     LABORATORY DATA:  CBC    Component Value Date/Time   WBC 9.7 07/12/2017 1217   WBC 8.2 04/25/2017 0946   RBC 3.28 (L) 07/12/2017 1217   RBC 4.56 04/25/2017 0946   HGB 9.3 (L) 07/12/2017 1217   HCT 28.6 (L) 07/12/2017 1217   PLT 120 (L) 07/12/2017 1217   MCV 87.2 07/12/2017 1217   MCH 28.4 07/12/2017 1217   MCH 27.9 04/25/2017 0946   MCHC 32.5 07/12/2017 1217   MCHC 31.4 04/25/2017 0946   RDW 14.3 07/12/2017 1217   LYMPHSABS  1.2 07/12/2017 1217   MONOABS 1.3 (H) 07/12/2017 1217   EOSABS 0.1 07/12/2017 1217   BASOSABS 0.0 07/12/2017 1217    CMP     Component Value Date/Time   NA 138 07/12/2017 1217   K 3.7 07/12/2017 1217   CL 105 04/25/2017 0946   CO2 22 07/12/2017 1217   GLUCOSE 137 07/12/2017 1217   BUN 22.6 07/12/2017 1217   CREATININE 1.3 (H) 07/12/2017 1217   CALCIUM 8.9 07/12/2017 1217   PROT 7.1 07/12/2017 1217   ALBUMIN 3.5 07/12/2017 1217   AST 63 (H) 07/12/2017 1217   ALT 46 07/12/2017 1217   ALKPHOS 83 07/12/2017 1217   BILITOT <0.22 07/12/2017 1217   GFRNONAA >60 04/25/2017 0946   GFRAA >60 04/25/2017 0946      ASSESSMENT and PLAN:   Malignant neoplasm of upper-outer quadrant of left breast in female, estrogen receptor negative ([place]) 04/30/2017: Left lumpectomy: IDC grade 3, 1.7 cm, DCIS, lymphovascular invasion present, margins negative, 0/1 lymph node negative, ER 0%, PR 0%, HER-2 negative ratio 1.37, Ki-67 40%, T1c N0 stage IB  Pathology counseling: I discussed the final pathology report of the patient provided  a copy of this report. I discussed the margins as well as lymph node surgeries. We also discussed the final staging along with previously performed ER/PR and HER-2/neu testing.  Recommendation: 1. adjuvant chemotherapy with dose dense Adriamycin and Cytoxan 4 followed by Taxol weekly 12 ( cannot use steroids because patient is diabetic) 2. Followed by radiation --------------------------------------------------------------------------------------------------------------------------- Current treatment: Cycle 4 of dose dense Adriamycin and Cytoxan (postponed due to toxicities) Echocardiogram 04/23/2017: EF 55-60%  Titania is not in good spirits today.  She will proceed with treatment.  She and I reviewed that the Taxol, which is the next regimen is better tolerated and will likely be much easier for her.  She is relieved to hear this.  Though still tearful, I explained to  her that if she feels depressed, that she is crying too much, or has lost enjoyment in things, to please let me know or Dr. Lindi Adie know and we can prescribe an anti depressant for her to help her through the next few months.  She declined at this point.  She will return in 2 weeks for labs, f/u and Taxol.     All questions were answered. The patient knows to call the clinic with any problems, questions or concerns. We can certainly see the patient much sooner if necessary.  A total of (30) minutes of face-to-face time was spent with this  patient with greater than 50% of that time in counseling and care-coordination.  This note was electronically signed. Scot Dock, NP 07/12/2017

## 2017-07-19 ENCOUNTER — Ambulatory Visit: Payer: BC Managed Care – PPO

## 2017-07-19 ENCOUNTER — Ambulatory Visit (HOSPITAL_BASED_OUTPATIENT_CLINIC_OR_DEPARTMENT_OTHER): Payer: BC Managed Care – PPO | Admitting: Hematology and Oncology

## 2017-07-19 ENCOUNTER — Other Ambulatory Visit: Payer: Self-pay

## 2017-07-19 ENCOUNTER — Other Ambulatory Visit (HOSPITAL_BASED_OUTPATIENT_CLINIC_OR_DEPARTMENT_OTHER): Payer: BC Managed Care – PPO

## 2017-07-19 ENCOUNTER — Ambulatory Visit (HOSPITAL_BASED_OUTPATIENT_CLINIC_OR_DEPARTMENT_OTHER): Payer: BC Managed Care – PPO

## 2017-07-19 VITALS — BP 108/66 | HR 85 | Temp 98.7°F | Resp 18

## 2017-07-19 DIAGNOSIS — R112 Nausea with vomiting, unspecified: Secondary | ICD-10-CM

## 2017-07-19 DIAGNOSIS — E860 Dehydration: Secondary | ICD-10-CM

## 2017-07-19 DIAGNOSIS — Z171 Estrogen receptor negative status [ER-]: Principal | ICD-10-CM

## 2017-07-19 DIAGNOSIS — C50412 Malignant neoplasm of upper-outer quadrant of left female breast: Secondary | ICD-10-CM

## 2017-07-19 DIAGNOSIS — Z95828 Presence of other vascular implants and grafts: Secondary | ICD-10-CM

## 2017-07-19 LAB — COMPREHENSIVE METABOLIC PANEL
ALT: 18 U/L (ref 0–55)
AST: 29 U/L (ref 5–34)
Albumin: 3.5 g/dL (ref 3.5–5.0)
Alkaline Phosphatase: 92 U/L (ref 40–150)
Anion Gap: 13 mEq/L — ABNORMAL HIGH (ref 3–11)
BILIRUBIN TOTAL: 0.63 mg/dL (ref 0.20–1.20)
BUN: 39 mg/dL — ABNORMAL HIGH (ref 7.0–26.0)
CO2: 18 meq/L — AB (ref 22–29)
CREATININE: 1.5 mg/dL — AB (ref 0.6–1.1)
Calcium: 8.6 mg/dL (ref 8.4–10.4)
Chloride: 102 mEq/L (ref 98–109)
EGFR: 43 mL/min/{1.73_m2} — ABNORMAL LOW (ref >60)
GLUCOSE: 253 mg/dL — AB (ref 70–140)
Potassium: 3.4 mEq/L — ABNORMAL LOW (ref 3.5–5.1)
SODIUM: 134 meq/L — AB (ref 136–145)
TOTAL PROTEIN: 7.1 g/dL (ref 6.4–8.3)

## 2017-07-19 LAB — CBC WITH DIFFERENTIAL/PLATELET
BASO%: 1.1 % (ref 0.0–2.0)
BASOS ABS: 0 10*3/uL (ref 0.0–0.1)
EOS%: 1.1 % (ref 0.0–7.0)
Eosinophils Absolute: 0 10*3/uL (ref 0.0–0.5)
HCT: 25.8 % — ABNORMAL LOW (ref 34.8–46.6)
HEMOGLOBIN: 8.4 g/dL — AB (ref 11.6–15.9)
LYMPH%: 38.6 % (ref 14.0–49.7)
MCH: 28.1 pg (ref 25.1–34.0)
MCHC: 32.6 g/dL (ref 31.5–36.0)
MCV: 86.3 fL (ref 79.5–101.0)
MONO#: 0 10*3/uL — ABNORMAL LOW (ref 0.1–0.9)
MONO%: 4.5 % (ref 0.0–14.0)
NEUT#: 0.5 10*3/uL — CL (ref 1.5–6.5)
NEUT%: 54.7 % (ref 38.4–76.8)
PLATELETS: 115 10*3/uL — AB (ref 145–400)
RBC: 2.99 10*6/uL — AB (ref 3.70–5.45)
RDW: 14.3 % (ref 11.2–14.5)
WBC: 0.9 10*3/uL — CL (ref 3.9–10.3)
lymph#: 0.3 10*3/uL — ABNORMAL LOW (ref 0.9–3.3)
nRBC: 0 % (ref 0–0)

## 2017-07-19 MED ORDER — HEPARIN SOD (PORK) LOCK FLUSH 100 UNIT/ML IV SOLN
500.0000 [IU] | Freq: Once | INTRAVENOUS | Status: AC | PRN
  Administered 2017-07-19: 500 [IU] via INTRAVENOUS
  Filled 2017-07-19: qty 5

## 2017-07-19 MED ORDER — SODIUM CHLORIDE 0.9% FLUSH
10.0000 mL | INTRAVENOUS | Status: DC | PRN
  Administered 2017-07-19: 10 mL via INTRAVENOUS
  Filled 2017-07-19: qty 10

## 2017-07-19 MED ORDER — MEPERIDINE HCL 25 MG/ML IJ SOLN
INTRAMUSCULAR | Status: AC
  Filled 2017-07-19: qty 1

## 2017-07-19 MED ORDER — PALONOSETRON HCL INJECTION 0.25 MG/5ML
INTRAVENOUS | Status: AC
  Filled 2017-07-19: qty 5

## 2017-07-19 MED ORDER — PALONOSETRON HCL INJECTION 0.25 MG/5ML
0.2500 mg | Freq: Once | INTRAVENOUS | Status: AC
  Administered 2017-07-19: 0.25 mg via INTRAVENOUS

## 2017-07-19 MED ORDER — SODIUM CHLORIDE 0.9 % IV SOLN
INTRAVENOUS | Status: AC
  Administered 2017-07-19: 10:00:00 via INTRAVENOUS

## 2017-07-19 NOTE — Assessment & Plan Note (Signed)
04/30/2017: Left lumpectomy: IDC grade 3, 1.7 cm, DCIS, lymphovascular invasion present, margins negative, 0/1 lymph node negative, ER 0%, PR 0%, HER-2 negative ratio 1.37, Ki-67 40%, T1c N0 stage IB  Pathology counseling: I discussed the final pathology report of the patient provided  a copy of this report. I discussed the margins as well as lymph node surgeries. We also discussed the final staging along with previously performed ER/PR and HER-2/neu testing.  Recommendation: 1. adjuvant chemotherapy with dose dense Adriamycin and Cytoxan 4 followed by Taxol weekly 12 ( cannot use steroids because patient is diabetic) 2. Followed by radiation --------------------------------------------------------------------------------------------------------------------------- Current treatment: Completed 4 cycles of dose dense Adriamycin and Cytoxan, today cycle 1 of Taxol Echocardiogram 04/23/2017: EF 55-60%  I discussed with her that normally with Taxol we do need steroids.  However we will try to treat her without the steroids.  She cannot tolerate Taxol then we will have to switch her to Abraxane if necessary. We will be monitoring her very closely for toxicities especially neuropathy.  Return to clinic 1 week for toxicity check

## 2017-07-19 NOTE — Patient Instructions (Signed)
Dehydration, Adult Dehydration is a condition in which there is not enough fluid or water in the body. This happens when you lose more fluids than you take in. Important organs, such as the kidneys, brain, and heart, cannot function without a proper amount of fluids. Any loss of fluids from the body can lead to dehydration. Dehydration can range from mild to severe. This condition should be treated right away to prevent it from becoming severe. What are the causes? This condition may be caused by:  Vomiting.  Diarrhea.  Excessive sweating, such as from heat exposure or exercise.  Not drinking enough fluid, especially: ? When ill. ? While doing activity that requires a lot of energy.  Excessive urination.  Fever.  Infection.  Certain medicines, such as medicines that cause the body to lose excess fluid (diuretics).  Inability to access safe drinking water.  Reduced physical ability to get adequate water and food.  What increases the risk? This condition is more likely to develop in people:  Who have a poorly controlled long-term (chronic) illness, such as diabetes, heart disease, or kidney disease.  Who are age 65 or older.  Who are disabled.  Who live in a place with high altitude.  Who play endurance sports.  What are the signs or symptoms? Symptoms of mild dehydration may include:  Thirst.  Dry lips.  Slightly dry mouth.  Dry, warm skin.  Dizziness. Symptoms of moderate dehydration may include:  Very dry mouth.  Muscle cramps.  Dark urine. Urine may be the color of tea.  Decreased urine production.  Decreased tear production.  Heartbeat that is irregular or faster than normal (palpitations).  Headache.  Light-headedness, especially when you stand up from a sitting position.  Fainting (syncope). Symptoms of severe dehydration may include:  Changes in skin, such as: ? Cold and clammy skin. ? Blotchy (mottled) or pale skin. ? Skin that does  not quickly return to normal after being lightly pinched and released (poor skin turgor).  Changes in body fluids, such as: ? Extreme thirst. ? No tear production. ? Inability to sweat when body temperature is high, such as in hot weather. ? Very little urine production.  Changes in vital signs, such as: ? Weak pulse. ? Pulse that is more than 100 beats a minute when sitting still. ? Rapid breathing. ? Low blood pressure.  Other changes, such as: ? Sunken eyes. ? Cold hands and feet. ? Confusion. ? Lack of energy (lethargy). ? Difficulty waking up from sleep. ? Short-term weight loss. ? Unconsciousness. How is this diagnosed? This condition is diagnosed based on your symptoms and a physical exam. Blood and urine tests may be done to help confirm the diagnosis. How is this treated? Treatment for this condition depends on the severity. Mild or moderate dehydration can often be treated at home. Treatment should be started right away. Do not wait until dehydration becomes severe. Severe dehydration is an emergency and it needs to be treated in a hospital. Treatment for mild dehydration may include:  Drinking more fluids.  Replacing salts and minerals in your blood (electrolytes) that you may have lost. Treatment for moderate dehydration may include:  Drinking an oral rehydration solution (ORS). This is a drink that helps you replace fluids and electrolytes (rehydrate). It can be found at pharmacies and retail stores. Treatment for severe dehydration may include:  Receiving fluids through an IV tube.  Receiving an electrolyte solution through a feeding tube that is passed through your nose   and into your stomach (nasogastric tube, or NG tube).  Correcting any abnormalities in electrolytes.  Treating the underlying cause of dehydration. Follow these instructions at home:  If directed by your health care provider, drink an ORS: ? Make an ORS by following instructions on the  package. ? Start by drinking small amounts, about  cup (120 mL) every 5-10 minutes. ? Slowly increase how much you drink until you have taken the amount recommended by your health care provider.  Drink enough clear fluid to keep your urine clear or pale yellow. If you were told to drink an ORS, finish the ORS first, then start slowly drinking other clear fluids. Drink fluids such as: ? Water. Do not drink only water. Doing that can lead to having too little salt (sodium) in the body (hyponatremia). ? Ice chips. ? Fruit juice that you have added water to (diluted fruit juice). ? Low-calorie sports drinks.  Avoid: ? Alcohol. ? Drinks that contain a lot of sugar. These include high-calorie sports drinks, fruit juice that is not diluted, and soda. ? Caffeine. ? Foods that are greasy or contain a lot of fat or sugar.  Take over-the-counter and prescription medicines only as told by your health care provider.  Do not take sodium tablets. This can lead to having too much sodium in the body (hypernatremia).  Eat foods that contain a healthy balance of electrolytes, such as bananas, oranges, potatoes, tomatoes, and spinach.  Keep all follow-up visits as told by your health care provider. This is important. Contact a health care provider if:  You have abdominal pain that: ? Gets worse. ? Stays in one area (localizes).  You have a rash.  You have a stiff neck.  You are more irritable than usual.  You are sleepier or more difficult to wake up than usual.  You feel weak or dizzy.  You feel very thirsty.  You have urinated only a small amount of very dark urine over 6-8 hours. Get help right away if:  You have symptoms of severe dehydration.  You cannot drink fluids without vomiting.  Your symptoms get worse with treatment.  You have a fever.  You have a severe headache.  You have vomiting or diarrhea that: ? Gets worse. ? Does not go away.  You have blood or green matter  (bile) in your vomit.  You have blood in your stool. This may cause stool to look black and tarry.  You have not urinated in 6-8 hours.  You faint.  Your heart rate while sitting still is over 100 beats a minute.  You have trouble breathing. This information is not intended to replace advice given to you by your health care provider. Make sure you discuss any questions you have with your health care provider. Document Released: 07/30/2005 Document Revised: 02/24/2016 Document Reviewed: 09/23/2015 Elsevier Interactive Patient Education  2018 [employer]  

## 2017-07-19 NOTE — Progress Notes (Unsigned)
1L NS and Aloxi per Dr. Lindi Adie. Orders placed. Infusion RN aware.  Cyndia Bent RN

## 2017-07-19 NOTE — Progress Notes (Signed)
Patient Care Team: [name], MD as PCP - General (Internal Medicine)  DIAGNOSIS:  Encounter Diagnosis  Name Primary?  . Malignant neoplasm of upper-outer quadrant of left breast in female, estrogen receptor negative ([place])     SUMMARY OF ONCOLOGIC HISTORY:   Malignant neoplasm of upper-outer quadrant of left breast in female, estrogen receptor negative ([place])   04/05/2017 Initial Diagnosis    Left breast asymmetry by ultrasound measured 1.3 cm at 2:30 position 10 cm from nipple, no axillary lymph nodes; biopsy IDC grade 2, ER 0%, PR 0%, HER-2 negative ratio 1.37, Ki-67 40%, T1c N0 stage IB AJCC 8       04/30/2017 Surgery    Left lumpectomy: IDC grade 3, 1.7 cm, DCIS, lymphovascular invasion present, margins negative, 0/1 lymph node negative, ER 0%, PR 0%, HER-2 negative ratio 1.37, Ki-67 40%, T1c N0 stage IB      05/22/2017 Genetic Testing    Patient had genetic testing due to a personal history of triple negative breast cancer.  The Common Hereditary Cancer Panel was ordered. The Hereditary Gene Panel offered by [facility] includes sequencing and/or deletion duplication testing of the following 46 genes: APC, ATM, AXIN2, BARD1, BMPR1A, BRCA1, BRCA2, BRIP1, CDH1, CDKN2A (p14ARF), CDKN2A (p16INK4a), CHEK2, CTNNA1, DICER1, EPCAM (Deletion/duplication testing only), GREM1 (promoter region deletion/duplication testing only), KIT, MEN1, MLH1, MSH2, MSH3, MSH6, MUTYH, NBN, NF1, NHTL1, PALB2, PDGFRA, PMS2, POLD1, POLE, PTEN, RAD50, RAD51C, RAD51D, SDHB, SDHC, SDHD, SMAD4, SMARCA4. STK11, TP53, TSC1, TSC2, and VHL.  The following genes were evaluated for sequence changes only: SDHA and HOXB13 c.251G>A variant only.    Results: No pathogenic mutations identified.  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  The date of this test report is 05/22/2017.       05/24/2017 -  Chemotherapy    Dose dense Adriamycin and Cytoxan 4 followed by Taxol weekly 12        CHIEF COMPLIANT:  Completed 4 cycles of dose dense Adriamycin and Cytoxan, profound nausea vomiting and fatigue  INTERVAL HISTORY: Audrey Peters is a 61 year old with above-mentioned history of left breast cancer treated with lumpectomy and is currently on adjuvant chemotherapy and today is cycle 1 of Taxol.    With the last chemotherapy she had profound fatigue along with profound nausea and vomiting.  In spite of antiemetic medications his symptoms do not improve.  She appears to be mildly dehydrated.  She has not been eating or drinking much food.  REVIEW OF SYSTEMS:   Constitutional: Denies fevers, chills or abnormal weight loss Eyes: Denies blurriness of vision Ears, nose, mouth, throat, and face: Denies mucositis or sore throat Respiratory: Denies cough, dyspnea or wheezes Cardiovascular: Denies palpitation, chest discomfort Gastrointestinal:  Denies nausea, heartburn or change in bowel habits Skin: Denies abnormal skin rashes Lymphatics: Denies new lymphadenopathy or easy bruising Neurological:Denies numbness, tingling or new weaknesses Behavioral/Psych: Mood is stable, no new changes  Extremities: No lower extremity edema Breast:  denies any pain or lumps or nodules in either breasts All other systems were reviewed with the patient and are negative.  I have reviewed the past medical history, past surgical history, social history and family history with the patient and they are unchanged from previous note.  ALLERGIES:  is allergic to invokana [canagliflozin]; sulfa antibiotics; and ciprofloxacin.  MEDICATIONS:  Current Outpatient Medications  Medication Sig Dispense Refill  . acetaminophen (TYLENOL 8 HOUR ARTHRITIS PAIN) 650 MG CR tablet Take 650 mg by mouth every 8 (eight) hours as  needed for pain.    . clotrimazole (LOTRIMIN) 1 % cream Apply 1 application topically daily as needed (for irritated/itchy skin.).     Marland Kitchen dexamethasone (DECADRON) 4 MG tablet Take 1 tablet (4 mg total) by mouth daily.  Take 1 tablet daily after chemotherapy and one tablet 2 days after chemotherapy with food (Patient not taking: Reported on 06/07/2017) 8 tablet 0  . Dulaglutide (TRULICITY) 1.5 VW/0.9WJ SOPN Inject 1.5 mg into the skin every Tuesday.    . estradiol (ESTRACE) 0.1 MG/GM vaginal cream Place 1 Applicatorful vaginally 2 (two) times a week.    . fluconazole (DIFLUCAN) 200 MG tablet Take 1 tablet (200 mg total) daily by mouth. 30 tablet 0  . ibuprofen (ADVIL,MOTRIN) 800 MG tablet Take 1 tablet (800 mg total) by mouth every 8 (eight) hours as needed. 30 tablet 0  . insulin regular human CONCENTRATED (HUMULIN R U-500 KWIKPEN) 500 UNIT/ML kwikpen Inject 50-120 Units into the skin 3 (three) times daily with meals. 120 units in the morning, 50 units at lunch and 50 units at supper.    . lidocaine-prilocaine (EMLA) cream Apply to affected area once 30 g 3  . LORazepam (ATIVAN) 0.5 MG tablet Take 1 tablet (0.5 mg total) by mouth at bedtime. (Patient not taking: Reported on 06/07/2017) 30 tablet 0  . losartan (COZAAR) 100 MG tablet Take 100 mg by mouth daily.    . magic mouthwash SOLN Take 5 mLs 4 (four) times daily as needed by mouth for mouth pain. 240 mL 0  . meloxicam (MOBIC) 15 MG tablet Take 15 mg by mouth daily.    . metFORMIN (GLUCOPHAGE) 1000 MG tablet Take 1,000 mg by mouth daily.    . ondansetron (ZOFRAN) 8 MG tablet Take 1 tablet (8 mg total) by mouth 2 (two) times daily as needed (may take at night to prevent AM nausea/vomiting). 30 tablet 1  . oxyCODONE (OXY IR/ROXICODONE) 5 MG immediate release tablet Take 1-2 tablets (5-10 mg total) by mouth every 6 (six) hours as needed for severe pain. (Patient not taking: Reported on 05/14/2017) 20 tablet 0  . prochlorperazine (COMPAZINE) 10 MG tablet Take 1 tablet (10 mg total) by mouth every 6 (six) hours as needed (Nausea or vomiting). 30 tablet 1  . triamterene-hydrochlorothiazide (MAXZIDE-25) 37.5-25 MG per tablet Take 1 tablet by mouth daily.      No  current facility-administered medications for this visit.    Facility-Administered Medications Ordered in Other Visits  Medication Dose Route Frequency Provider Last Rate Last Dose  . 0.9 %  sodium chloride infusion   Intravenous Continuous [name], MD      . palonosetron (ALOXI) injection 0.25 mg  0.25 mg Intravenous Once [name], MD        PHYSICAL EXAMINATION: ECOG PERFORMANCE STATUS: 1 - Symptomatic but completely ambulatory  Vitals:   07/19/17 0920 07/19/17 0935  BP: (!) 87/58 100/64  Pulse: 97   Resp: 18   Temp: 98.3 F (36.8 C)   SpO2: 100%    Filed Weights   07/19/17 0920  Weight: 217 lb 9.6 oz (98.7 kg)    GENERAL:alert, no distress and comfortable SKIN: skin color, texture, turgor are normal, no rashes or significant lesions EYES: normal, Conjunctiva are pink and non-injected, sclera clear OROPHARYNX:no exudate, no erythema and lips, buccal mucosa, and tongue normal  NECK: supple, thyroid normal size, non-tender, without nodularity LYMPH:  no palpable lymphadenopathy in the cervical, axillary or inguinal LUNGS: clear to auscultation and percussion with  normal breathing effort HEART: regular rate & rhythm and no murmurs and no lower extremity edema ABDOMEN:abdomen soft, non-tender and normal bowel sounds MUSCULOSKELETAL:no cyanosis of digits and no clubbing  NEURO: alert & oriented x 3 with fluent speech, no focal motor/sensory deficits EXTREMITIES: No lower extremity edema BREAST: No palpable masses or nodules in either right or left breasts. No palpable axillary supraclavicular or infraclavicular adenopathy no breast tenderness or nipple discharge. (exam performed in the presence of a chaperone)  LABORATORY DATA:  I have reviewed the data as listed   Chemistry      Component Value Date/Time   NA 134 (L) 07/19/2017 0817   K 3.4 (L) 07/19/2017 0817   CL 105 04/25/2017 0946   CO2 18 (L) 07/19/2017 0817   BUN 39.0 (H) 07/19/2017 0817   CREATININE  1.5 (H) 07/19/2017 0817      Component Value Date/Time   CALCIUM 8.6 07/19/2017 0817   ALKPHOS 92 07/19/2017 0817   AST 29 07/19/2017 0817   ALT 18 07/19/2017 0817   BILITOT 0.63 07/19/2017 0817       Lab Results  Component Value Date   WBC 0.9 (LL) 07/19/2017   HGB 8.4 (L) 07/19/2017   HCT 25.8 (L) 07/19/2017   MCV 86.3 07/19/2017   PLT 115 (L) 07/19/2017   NEUTROABS 0.5 (LL) 07/19/2017    ASSESSMENT & PLAN:  Malignant neoplasm of upper-outer quadrant of left breast in female, estrogen receptor negative ([place]) 04/30/2017: Left lumpectomy: IDC grade 3, 1.7 cm, DCIS, lymphovascular invasion present, margins negative, 0/1 lymph node negative, ER 0%, PR 0%, HER-2 negative ratio 1.37, Ki-67 40%, T1c N0 stage IB  Pathology counseling: I discussed the final pathology report of the patient provided  a copy of this report. I discussed the margins as well as lymph node surgeries. We also discussed the final staging along with previously performed ER/PR and HER-2/neu testing.  Recommendation: 1. adjuvant chemotherapy with dose dense Adriamycin and Cytoxan 4 followed by Taxol weekly 12 ( cannot use steroids because patient is diabetic) 2. Followed by radiation --------------------------------------------------------------------------------------------------------------------------- Current treatment: Completed 4 cycles of dose dense Adriamycin and Cytoxan, today is cycle 4-day 8 Echocardiogram 04/23/2017: EF 55-60%  Patient has profound nausea and vomiting with chemotherapy.  We will administer IV fluids normal saline along with Aloxi. Dehydration: Creatinine 1.5, IV fluids will be given today I encouraged her to drink more water.  Return to clinic 1 week for cycle 1 Taxol   I spent 25 minutes talking to the patient of which more than half was spent in counseling and coordination of care.  No orders of the defined types were placed in this encounter.  The patient has a good  understanding of the overall plan. she agrees with it. she will call with any problems that may develop before the next visit here.   Rulon Eisenmenger, MD 07/19/17

## 2017-07-25 ENCOUNTER — Ambulatory Visit (HOSPITAL_COMMUNITY)
Admission: RE | Admit: 2017-07-25 | Discharge: 2017-07-25 | Disposition: A | Discharge status: Home / Self Care | Payer: BC Managed Care – PPO | Source: Ambulatory Visit | Attending: Hematology and Oncology | Admitting: Hematology and Oncology

## 2017-07-25 ENCOUNTER — Ambulatory Visit (HOSPITAL_BASED_OUTPATIENT_CLINIC_OR_DEPARTMENT_OTHER): Payer: BC Managed Care – PPO

## 2017-07-25 ENCOUNTER — Ambulatory Visit (HOSPITAL_BASED_OUTPATIENT_CLINIC_OR_DEPARTMENT_OTHER): Payer: BC Managed Care – PPO | Admitting: Hematology and Oncology

## 2017-07-25 ENCOUNTER — Ambulatory Visit: Payer: BC Managed Care – PPO

## 2017-07-25 ENCOUNTER — Other Ambulatory Visit: Payer: Self-pay

## 2017-07-25 VITALS — BP 101/60 | HR 89 | Temp 98.6°F | Resp 18

## 2017-07-25 DIAGNOSIS — Z171 Estrogen receptor negative status [ER-]: Principal | ICD-10-CM

## 2017-07-25 DIAGNOSIS — D649 Anemia, unspecified: Secondary | ICD-10-CM | POA: Insufficient documentation

## 2017-07-25 DIAGNOSIS — D6481 Anemia due to antineoplastic chemotherapy: Secondary | ICD-10-CM | POA: Diagnosis not present

## 2017-07-25 DIAGNOSIS — R42 Dizziness and giddiness: Secondary | ICD-10-CM | POA: Diagnosis not present

## 2017-07-25 DIAGNOSIS — C50412 Malignant neoplasm of upper-outer quadrant of left female breast: Secondary | ICD-10-CM

## 2017-07-25 DIAGNOSIS — Z5111 Encounter for antineoplastic chemotherapy: Secondary | ICD-10-CM

## 2017-07-25 DIAGNOSIS — Z95828 Presence of other vascular implants and grafts: Secondary | ICD-10-CM

## 2017-07-25 LAB — CBC WITH DIFFERENTIAL/PLATELET
BASO%: 0.2 % (ref 0.0–2.0)
BASOS ABS: 0 10*3/uL (ref 0.0–0.1)
EOS ABS: 0 10*3/uL (ref 0.0–0.5)
EOS%: 0.1 % (ref 0.0–7.0)
HEMATOCRIT: 25.2 % — AB (ref 34.8–46.6)
HEMOGLOBIN: 8.2 g/dL — AB (ref 11.6–15.9)
LYMPH#: 0.7 10*3/uL — AB (ref 0.9–3.3)
LYMPH%: 9.4 % — ABNORMAL LOW (ref 14.0–49.7)
MCH: 28.3 pg (ref 25.1–34.0)
MCHC: 32.5 g/dL (ref 31.5–36.0)
MCV: 87.3 fL (ref 79.5–101.0)
MONO#: 0.9 10*3/uL (ref 0.1–0.9)
MONO%: 11.7 % (ref 0.0–14.0)
NEUT#: 6.2 10*3/uL (ref 1.5–6.5)
NEUT%: 78.6 % — AB (ref 38.4–76.8)
PLATELETS: 126 10*3/uL — AB (ref 145–400)
RBC: 2.88 10*6/uL — ABNORMAL LOW (ref 3.70–5.45)
RDW: 15 % — AB (ref 11.2–14.5)
WBC: 7.9 10*3/uL (ref 3.9–10.3)

## 2017-07-25 LAB — COMPREHENSIVE METABOLIC PANEL
ALT: 19 U/L (ref 0–55)
ANION GAP: 14 meq/L — AB (ref 3–11)
AST: 31 U/L (ref 5–34)
Albumin: 3.4 g/dL — ABNORMAL LOW (ref 3.5–5.0)
Alkaline Phosphatase: 84 U/L (ref 40–150)
BILIRUBIN TOTAL: 0.25 mg/dL (ref 0.20–1.20)
BUN: 24.8 mg/dL (ref 7.0–26.0)
CHLORIDE: 105 meq/L (ref 98–109)
CO2: 19 meq/L — AB (ref 22–29)
Calcium: 8.5 mg/dL (ref 8.4–10.4)
Creatinine: 1.5 mg/dL — ABNORMAL HIGH (ref 0.6–1.1)
EGFR: 44 mL/min/{1.73_m2} — AB (ref >60)
GLUCOSE: 314 mg/dL — AB (ref 70–140)
POTASSIUM: 3.6 meq/L (ref 3.5–5.1)
SODIUM: 138 meq/L (ref 136–145)
Total Protein: 6.7 g/dL (ref 6.4–8.3)

## 2017-07-25 LAB — ABO/RH: ABO/RH(D): O POS

## 2017-07-25 LAB — PREPARE RBC (CROSSMATCH)

## 2017-07-25 MED ORDER — FAMOTIDINE IN NACL 20-0.9 MG/50ML-% IV SOLN
INTRAVENOUS | Status: AC
  Filled 2017-07-25: qty 50

## 2017-07-25 MED ORDER — PALONOSETRON HCL INJECTION 0.25 MG/5ML
INTRAVENOUS | Status: AC
  Filled 2017-07-25: qty 5

## 2017-07-25 MED ORDER — DIPHENHYDRAMINE HCL 50 MG/ML IJ SOLN
INTRAMUSCULAR | Status: AC
  Filled 2017-07-25: qty 1

## 2017-07-25 MED ORDER — DIPHENHYDRAMINE HCL 50 MG/ML IJ SOLN
50.0000 mg | Freq: Once | INTRAMUSCULAR | Status: AC
  Administered 2017-07-25: 50 mg via INTRAVENOUS

## 2017-07-25 MED ORDER — DEXAMETHASONE SODIUM PHOSPHATE 10 MG/ML IJ SOLN
INTRAMUSCULAR | Status: AC
  Filled 2017-07-25: qty 1

## 2017-07-25 MED ORDER — SODIUM CHLORIDE 0.9 % IV SOLN: 10.0000 mg | Freq: Once | INTRAVENOUS | Status: DC

## 2017-07-25 MED ORDER — FAMOTIDINE IN NACL 20-0.9 MG/50ML-% IV SOLN
20.0000 mg | Freq: Once | INTRAVENOUS | Status: AC
  Administered 2017-07-25: 20 mg via INTRAVENOUS

## 2017-07-25 MED ORDER — HEPARIN SOD (PORK) LOCK FLUSH 100 UNIT/ML IV SOLN
500.0000 [IU] | Freq: Once | INTRAVENOUS | Status: AC | PRN
  Administered 2017-07-25: 500 [IU]
  Filled 2017-07-25: qty 5

## 2017-07-25 MED ORDER — SODIUM CHLORIDE 0.9% FLUSH
10.0000 mL | INTRAVENOUS | Status: DC | PRN
  Administered 2017-07-25: 10 mL
  Filled 2017-07-25: qty 10

## 2017-07-25 MED ORDER — DEXAMETHASONE SODIUM PHOSPHATE 10 MG/ML IJ SOLN
10.0000 mg | Freq: Once | INTRAMUSCULAR | Status: AC
  Administered 2017-07-25: 10 mg via INTRAVENOUS

## 2017-07-25 MED ORDER — PALONOSETRON HCL INJECTION 0.25 MG/5ML
0.2500 mg | Freq: Once | INTRAVENOUS | Status: AC
  Administered 2017-07-25: 0.25 mg via INTRAVENOUS

## 2017-07-25 MED ORDER — SODIUM CHLORIDE 0.9 % IV SOLN
Freq: Once | INTRAVENOUS | Status: AC
  Administered 2017-07-25: 13:00:00 via INTRAVENOUS

## 2017-07-25 MED ORDER — SODIUM CHLORIDE 0.9% FLUSH
10.0000 mL | INTRAVENOUS | Status: DC | PRN
Start: 2017-07-25 — End: 2017-07-25
  Administered 2017-07-25: 10 mL via INTRAVENOUS
  Filled 2017-07-25: qty 10

## 2017-07-25 MED ORDER — SODIUM CHLORIDE 0.9 % IV SOLN
80.0000 mg/m2 | Freq: Once | INTRAVENOUS | Status: AC
  Administered 2017-07-25: 180 mg via INTRAVENOUS
  Filled 2017-07-25: qty 30

## 2017-07-25 NOTE — Assessment & Plan Note (Signed)
04/30/2017: Left lumpectomy: IDC grade 3, 1.7 cm, DCIS, lymphovascular invasion present, margins negative, 0/1 lymph node negative, ER 0%, PR 0%, HER-2 negative ratio 1.37, Ki-67 40%, T1c N0 stage IB  Pathology counseling: I discussed the final pathology report of the patient provided a copy of this report. I discussed the margins as well as lymph node surgeries. We also discussed the final staging along with previously performed ER/PR and HER-2/neu testing.  Recommendation: 1. adjuvant chemotherapy with dose dense Adriamycin and Cytoxan 4 followed by Taxol weekly 12 ( cannot use steroids because patient is diabetic) 2. Followed by radiation --------------------------------------------------------------------------------------------------------------------------- Current treatment: Completed 4 cycles ofdose dense Adriamycin and Cytoxan, today is cycle 1 Taxol  Chemo toxicities: 1.  With Adriamycin and Cytoxan she had profound intractable nausea and vomiting 2. renal impairment due to dehydration We will be monitoring her very closely for toxicities. Return to clinic in 1 week for toxicity check and for cycle 2

## 2017-07-25 NOTE — Patient Instructions (Signed)
[facility] Discharge Instructions for Patients Receiving Chemotherapy  Today you received the following chemotherapy agents Taxol  To help prevent nausea and vomiting after your treatment, we encourage you to take your nausea medication as directed   If you develop nausea and vomiting that is not controlled by your nausea medication, call the clinic.   BELOW ARE SYMPTOMS THAT SHOULD BE REPORTED IMMEDIATELY:  *FEVER GREATER THAN 100.5 F  *CHILLS WITH OR WITHOUT FEVER  NAUSEA AND VOMITING THAT IS NOT CONTROLLED WITH YOUR NAUSEA MEDICATION  *UNUSUAL SHORTNESS OF BREATH  *UNUSUAL BRUISING OR BLEEDING  TENDERNESS IN MOUTH AND THROAT WITH OR WITHOUT PRESENCE OF ULCERS  *URINARY PROBLEMS  *BOWEL PROBLEMS  UNUSUAL RASH Items with * indicate a potential emergency and should be followed up as soon as possible.  Feel free to call the clinic should you have any questions or concerns. The clinic phone number is [phone].  Please show the CHEMO ALERT CARD at check-in to the Emergency Department and triage nurse.   Paclitaxel injection What is this medicine? PACLITAXEL (PAK li TAX el) is a chemotherapy drug. It targets fast dividing cells, like cancer cells, and causes these cells to die. This medicine is used to treat ovarian cancer, breast cancer, and other cancers. This medicine may be used for other purposes; ask your health care provider or pharmacist if you have questions. COMMON BRAND NAME(S): Onxol, Taxol What should I tell my health care provider before I take this medicine? They need to know if you have any of these conditions: -blood disorders -irregular heartbeat -infection (especially a virus infection such as chickenpox, cold sores, or herpes) -liver disease -previous or ongoing radiation therapy -an unusual or allergic reaction to paclitaxel, alcohol, polyoxyethylated castor oil, other chemotherapy agents, other medicines, foods, dyes, or  preservatives -pregnant or trying to get pregnant -breast-feeding How should I use this medicine? This drug is given as an infusion into a vein. It is administered in a hospital or clinic by a specially trained health care professional. Talk to your pediatrician regarding the use of this medicine in children. Special care may be needed. Overdosage: If you think you have taken too much of this medicine contact a poison control center or emergency room at once. NOTE: This medicine is only for you. Do not share this medicine with others. What if I miss a dose? It is important not to miss your dose. Call your doctor or health care professional if you are unable to keep an appointment. What may interact with this medicine? Do not take this medicine with any of the following medications: -disulfiram -metronidazole This medicine may also interact with the following medications: -cyclosporine -diazepam -ketoconazole -medicines to increase blood counts like filgrastim, pegfilgrastim, sargramostim -other chemotherapy drugs like cisplatin, doxorubicin, epirubicin, etoposide, teniposide, vincristine -quinidine -testosterone -vaccines -verapamil Talk to your doctor or health care professional before taking any of these medicines: -acetaminophen -aspirin -ibuprofen -ketoprofen -naproxen This list may not describe all possible interactions. Give your health care provider a list of all the medicines, herbs, non-prescription drugs, or dietary supplements you use. Also tell them if you smoke, drink alcohol, or use illegal drugs. Some items may interact with your medicine. What should I watch for while using this medicine? Your condition will be monitored carefully while you are receiving this medicine. You will need important blood work done while you are taking this medicine. This medicine can cause serious allergic reactions. To reduce your risk you will need to   take other medicine(s) before  treatment with this medicine. If you experience allergic reactions like skin rash, itching or hives, swelling of the face, lips, or tongue, tell your doctor or health care professional right away. In some cases, you may be given additional medicines to help with side effects. Follow all directions for their use. This drug may make you feel generally unwell. This is not uncommon, as chemotherapy can affect healthy cells as well as cancer cells. Report any side effects. Continue your course of treatment even though you feel ill unless your doctor tells you to stop. Call your doctor or health care professional for advice if you get a fever, chills or sore throat, or other symptoms of a cold or flu. Do not treat yourself. This drug decreases your body's ability to fight infections. Try to avoid being around people who are sick. This medicine may increase your risk to bruise or bleed. Call your doctor or health care professional if you notice any unusual bleeding. Be careful brushing and flossing your teeth or using a toothpick because you may get an infection or bleed more easily. If you have any dental work done, tell your dentist you are receiving this medicine. Avoid taking products that contain aspirin, acetaminophen, ibuprofen, naproxen, or ketoprofen unless instructed by your doctor. These medicines may hide a fever. Do not become pregnant while taking this medicine. Women should inform their doctor if they wish to become pregnant or think they might be pregnant. There is a potential for serious side effects to an unborn child. Talk to your health care professional or pharmacist for more information. Do not breast-feed an infant while taking this medicine. Men are advised not to father a child while receiving this medicine. This product may contain alcohol. Ask your pharmacist or healthcare provider if this medicine contains alcohol. Be sure to tell all healthcare providers you are taking this medicine.  Certain medicines, like metronidazole and disulfiram, can cause an unpleasant reaction when taken with alcohol. The reaction includes flushing, headache, nausea, vomiting, sweating, and increased thirst. The reaction can last from 30 minutes to several hours. What side effects may I notice from receiving this medicine? Side effects that you should report to your doctor or health care professional as soon as possible: -allergic reactions like skin rash, itching or hives, swelling of the face, lips, or tongue -low blood counts - This drug may decrease the number of white blood cells, red blood cells and platelets. You may be at increased risk for infections and bleeding. -signs of infection - fever or chills, cough, sore throat, pain or difficulty passing urine -signs of decreased platelets or bleeding - bruising, pinpoint red spots on the skin, black, tarry stools, nosebleeds -signs of decreased red blood cells - unusually weak or tired, fainting spells, lightheadedness -breathing problems -chest pain -high or low blood pressure -mouth sores -nausea and vomiting -pain, swelling, redness or irritation at the injection site -pain, tingling, numbness in the hands or feet -slow or irregular heartbeat -swelling of the ankle, feet, hands Side effects that usually do not require medical attention (report to your doctor or health care professional if they continue or are bothersome): -bone pain -complete hair loss including hair on your head, underarms, pubic hair, eyebrows, and eyelashes -changes in the color of fingernails -diarrhea -loosening of the fingernails -loss of appetite -muscle or joint pain -red flush to skin -sweating This list may not describe all possible side effects. Call your doctor for medical advice about   side effects. You may report side effects to FDA at 1-800-FDA-1088. Where should I keep my medicine? This drug is given in a hospital or clinic and will not be stored at  home. NOTE: This sheet is a summary. It may not cover all possible information. If you have questions about this medicine, talk to your doctor, pharmacist, or health care provider.  2018 Elsevier/Gold Standard (2015-05-31 19:58:00)   

## 2017-07-25 NOTE — Progress Notes (Signed)
Pt to receive 2 units of prbc on Saturday 07/27/17 per Dr.Gudena for hg 8.2. Pt okay to treat today with chemo. Pt will need a t/c today for Saturday's appt. Called [facility], lab and blood bank. Infusion RN aware of plan

## 2017-07-25 NOTE — Progress Notes (Signed)
Per May RN per Dr. Lindi Adie okay to proceed with treatment with 07/25/17 labs. Pt to have 2 units of PRBCs 07/27/17. Pt aware and verbalizes understanding. Pt tolerated infusion well. Pt and VS stable at discharge.

## 2017-07-25 NOTE — Progress Notes (Signed)
Patient Care Team: [name], MD as PCP - General (Internal Medicine)  DIAGNOSIS:  Encounter Diagnosis  Name Primary?  . Malignant neoplasm of upper-outer quadrant of left breast in female, estrogen receptor negative ([place])     SUMMARY OF ONCOLOGIC HISTORY:   Malignant neoplasm of upper-outer quadrant of left breast in female, estrogen receptor negative ([place])   04/05/2017 Initial Diagnosis    Left breast asymmetry by ultrasound measured 1.3 cm at 2:30 position 10 cm from nipple, no axillary lymph nodes; biopsy IDC grade 2, ER 0%, PR 0%, HER-2 negative ratio 1.37, Ki-67 40%, T1c N0 stage IB AJCC 8       04/30/2017 Surgery    Left lumpectomy: IDC grade 3, 1.7 cm, DCIS, lymphovascular invasion present, margins negative, 0/1 lymph node negative, ER 0%, PR 0%, HER-2 negative ratio 1.37, Ki-67 40%, T1c N0 stage IB      05/22/2017 Genetic Testing    Patient had genetic testing due to a personal history of triple negative breast cancer.  The Common Hereditary Cancer Panel was ordered. The Hereditary Gene Panel offered by [facility] includes sequencing and/or deletion duplication testing of the following 46 genes: APC, ATM, AXIN2, BARD1, BMPR1A, BRCA1, BRCA2, BRIP1, CDH1, CDKN2A (p14ARF), CDKN2A (p16INK4a), CHEK2, CTNNA1, DICER1, EPCAM (Deletion/duplication testing only), GREM1 (promoter region deletion/duplication testing only), KIT, MEN1, MLH1, MSH2, MSH3, MSH6, MUTYH, NBN, NF1, NHTL1, PALB2, PDGFRA, PMS2, POLD1, POLE, PTEN, RAD50, RAD51C, RAD51D, SDHB, SDHC, SDHD, SMAD4, SMARCA4. STK11, TP53, TSC1, TSC2, and VHL.  The following genes were evaluated for sequence changes only: SDHA and HOXB13 c.251G>A variant only.    Results: No pathogenic mutations identified.  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  The date of this test report is 05/22/2017.       05/24/2017 -  Chemotherapy    Dose dense Adriamycin and Cytoxan 4 followed by Taxol weekly 12        CHIEF COMPLIANT:  Cycle 1 Taxol  INTERVAL HISTORY: Audrey Peters is a 61-year-old with above-mentioned history of left breast cancer underwent lumpectomy and is currently on adjuvant chemotherapy.  She completed 4 cycles of dose dense Adriamycin and Cytoxan and did moderately well.  She had intractable nausea and vomiting issues because of which she was dehydrated and had to be given IV fluids multiple times in between her cycles.  She is glad to be done with Adriamycin and Cytoxan and is here to receive her first cycle of Taxol.  She feels dizzy and lightheaded when she stands up as well as shortness of breath to minimal exertion and severe fatigue.  REVIEW OF SYSTEMS:   Constitutional: Denies fevers, chills or abnormal weight loss, severe fatigue Eyes: Denies blurriness of vision Ears, nose, mouth, throat, and face: Denies mucositis or sore throat Respiratory: Shortness of breath to minimal exertion Cardiovascular: Intermittent palpitations Gastrointestinal:  Denies nausea, heartburn or change in bowel habits Skin: Denies abnormal skin rashes Lymphatics: Denies new lymphadenopathy or easy bruising Neurological: Mild diabetic neuropathy Behavioral/Psych: Mood is stable, no new changes  Extremities: No lower extremity edema  All other systems were reviewed with the patient and are negative.  I have reviewed the past medical history, past surgical history, social history and family history with the patient and they are unchanged from previous note.  ALLERGIES:  is allergic to invokana [canagliflozin]; sulfa antibiotics; and ciprofloxacin.  MEDICATIONS:  Current Outpatient Medications  Medication Sig Dispense Refill  . acetaminophen (TYLENOL 8 HOUR ARTHRITIS PAIN) 650 MG CR tablet Take  650 mg by mouth every 8 (eight) hours as needed for pain.    . clotrimazole (LOTRIMIN) 1 % cream Apply 1 application topically daily as needed (for irritated/itchy skin.).     Marland Kitchen Dulaglutide (TRULICITY) 1.5 CL/2.7NT SOPN  Inject 1.5 mg into the skin every Tuesday.    . estradiol (ESTRACE) 0.1 MG/GM vaginal cream Place 1 Applicatorful vaginally 2 (two) times a week.    Marland Kitchen ibuprofen (ADVIL,MOTRIN) 800 MG tablet Take 1 tablet (800 mg total) by mouth every 8 (eight) hours as needed. 30 tablet 0  . insulin regular human CONCENTRATED (HUMULIN R U-500 KWIKPEN) 500 UNIT/ML kwikpen Inject 50-120 Units into the skin 3 (three) times daily with meals. 120 units in the morning, 50 units at lunch and 50 units at supper.    . lidocaine-prilocaine (EMLA) cream Apply to affected area once 30 g 3  . losartan (COZAAR) 100 MG tablet Take 100 mg by mouth daily.    . magic mouthwash SOLN Take 5 mLs 4 (four) times daily as needed by mouth for mouth pain. 240 mL 0  . meloxicam (MOBIC) 15 MG tablet Take 15 mg by mouth daily.    . metFORMIN (GLUCOPHAGE) 1000 MG tablet Take 1,000 mg by mouth daily.    . ondansetron (ZOFRAN) 8 MG tablet Take 1 tablet (8 mg total) by mouth 2 (two) times daily as needed (may take at night to prevent AM nausea/vomiting). 30 tablet 1  . prochlorperazine (COMPAZINE) 10 MG tablet Take 1 tablet (10 mg total) by mouth every 6 (six) hours as needed (Nausea or vomiting). 30 tablet 1  . triamterene-hydrochlorothiazide (MAXZIDE-25) 37.5-25 MG per tablet Take 1 tablet by mouth daily.      No current facility-administered medications for this visit.     PHYSICAL EXAMINATION: ECOG PERFORMANCE STATUS: 2 - Symptomatic, <50% confined to bed  Vitals:   07/25/17 1108  BP: 116/70  Pulse: (!) 102  Resp: 17  Temp: 98.2 F (36.8 C)  SpO2: 100%   Filed Weights   07/25/17 1108  Weight: 220 lb 4.8 oz (99.9 kg)    GENERAL:alert, no distress and comfortable SKIN: skin color, texture, turgor are normal, no rashes or significant lesions EYES: normal, Conjunctiva are pink and non-injected, sclera clear OROPHARYNX:no exudate, no erythema and lips, buccal mucosa, and tongue normal  NECK: supple, thyroid normal size,  non-tender, without nodularity LYMPH:  no palpable lymphadenopathy in the cervical, axillary or inguinal LUNGS: clear to auscultation and percussion with normal breathing effort HEART: regular rate & rhythm and no murmurs and no lower extremity edema ABDOMEN:abdomen soft, non-tender and normal bowel sounds MUSCULOSKELETAL:no cyanosis of digits and no clubbing  NEURO: alert & oriented x 3 with fluent speech, distal sensory neuropathy from diabetes EXTREMITIES: No lower extremity edema   LABORATORY DATA:  I have reviewed the data as listed   Chemistry      Component Value Date/Time   NA 138 07/25/2017 1028   K 3.6 07/25/2017 1028   CL 105 04/25/2017 0946   CO2 19 (L) 07/25/2017 1028   BUN 24.8 07/25/2017 1028   CREATININE 1.5 (H) 07/25/2017 1028      Component Value Date/Time   CALCIUM 8.5 07/25/2017 1028   ALKPHOS 84 07/25/2017 1028   AST 31 07/25/2017 1028   ALT 19 07/25/2017 1028   BILITOT 0.25 07/25/2017 1028       Lab Results  Component Value Date   WBC 7.9 07/25/2017   HGB 8.2 (L) 07/25/2017  HCT 25.2 (L) 07/25/2017   MCV 87.3 07/25/2017   PLT 126 (L) 07/25/2017   NEUTROABS 6.2 07/25/2017    ASSESSMENT & PLAN:  Malignant neoplasm of upper-outer quadrant of left breast in female, estrogen receptor negative ([place]) 04/30/2017: Left lumpectomy: IDC grade 3, 1.7 cm, DCIS, lymphovascular invasion present, margins negative, 0/1 lymph node negative, ER 0%, PR 0%, HER-2 negative ratio 1.37, Ki-67 40%, T1c N0 stage IB  Pathology counseling: I discussed the final pathology report of the patient provided a copy of this report. I discussed the margins as well as lymph node surgeries. We also discussed the final staging along with previously performed ER/PR and HER-2/neu testing.  Recommendation: 1. adjuvant chemotherapy with dose dense Adriamycin and Cytoxan 4 followed by Taxol weekly 12 ( cannot use steroids because patient is diabetic) 2. Followed by  radiation --------------------------------------------------------------------------------------------------------------------------- Current treatment: Completed 4 cycles ofdose dense Adriamycin and Cytoxan, today is cycle 1 Taxol  Chemo toxicities: 1.  With Adriamycin and Cytoxan she had profound intractable nausea and vomiting 2. renal impairment due to dehydration 3.  Severe chemotherapy-induced anemia: She will need 2 units of packed red cells.  We will type and cross and get it ready for this Saturday.  We will be monitoring her very closely for toxicities. Return to clinic in 1 week for toxicity check and will see Mendel Ryder.  I will see her with cycle 4  I spent 25 minutes talking to the patient of which more than half was spent in counseling and coordination of care.  No orders of the defined types were placed in this encounter.  The patient has a good understanding of the overall plan. she agrees with it. she will call with any problems that may develop before the next visit here.   Rulon Eisenmenger, MD 07/25/17

## 2017-07-26 LAB — PREPARE RBC (CROSSMATCH)

## 2017-07-27 ENCOUNTER — Ambulatory Visit (HOSPITAL_BASED_OUTPATIENT_CLINIC_OR_DEPARTMENT_OTHER): Payer: BC Managed Care – PPO

## 2017-07-27 ENCOUNTER — Telehealth: Payer: Self-pay | Admitting: Hematology and Oncology

## 2017-07-27 DIAGNOSIS — D6481 Anemia due to antineoplastic chemotherapy: Secondary | ICD-10-CM | POA: Diagnosis not present

## 2017-07-27 DIAGNOSIS — D649 Anemia, unspecified: Secondary | ICD-10-CM | POA: Diagnosis not present

## 2017-07-27 MED ORDER — SODIUM CHLORIDE 0.9 % IV SOLN
250.0000 mL | Freq: Once | INTRAVENOUS | Status: AC
Start: 2017-07-27 — End: 2017-07-27
  Administered 2017-07-27: 250 mL via INTRAVENOUS

## 2017-07-27 MED ORDER — DIPHENHYDRAMINE HCL 25 MG PO CAPS
ORAL_CAPSULE | ORAL | Status: AC
  Filled 2017-07-27: qty 1

## 2017-07-27 MED ORDER — SODIUM CHLORIDE 0.9% FLUSH
10.0000 mL | INTRAVENOUS | Status: DC | PRN
  Filled 2017-07-27: qty 10

## 2017-07-27 MED ORDER — DIPHENHYDRAMINE HCL 25 MG PO CAPS
25.0000 mg | ORAL_CAPSULE | Freq: Once | ORAL | Status: AC
  Administered 2017-07-27: 25 mg via ORAL

## 2017-07-27 MED ORDER — ACETAMINOPHEN 325 MG PO TABS
650.0000 mg | ORAL_TABLET | Freq: Once | ORAL | Status: AC
  Administered 2017-07-27: 650 mg via ORAL

## 2017-07-27 MED ORDER — HEPARIN SOD (PORK) LOCK FLUSH 100 UNIT/ML IV SOLN
500.0000 [IU] | Freq: Every day | INTRAVENOUS | Status: DC | PRN
  Filled 2017-07-27: qty 5

## 2017-07-27 MED ORDER — ACETAMINOPHEN 325 MG PO TABS
ORAL_TABLET | ORAL | Status: AC
  Filled 2017-07-27: qty 2

## 2017-07-27 NOTE — Patient Instructions (Signed)

## 2017-07-27 NOTE — Telephone Encounter (Signed)
Spoke with patient re change in appointment time on 12/21 to 12:45 pm.

## 2017-07-28 LAB — BPAM RBC
BLOOD PRODUCT EXPIRATION DATE: 201901082359
Blood Product Expiration Date: 201901082359
ISSUE DATE / TIME: 201812150906
ISSUE DATE / TIME: 201812150906
Unit Type and Rh: 5100
Unit Type and Rh: 5100

## 2017-07-28 LAB — TYPE AND SCREEN
ABO/RH(D): O POS
ANTIBODY SCREEN: NEGATIVE
UNIT DIVISION: 0
UNIT DIVISION: 0

## 2017-07-29 ENCOUNTER — Ambulatory Visit (HOSPITAL_COMMUNITY)
Admission: RE | Admit: 2017-07-29 | Discharge: 2017-07-29 | Disposition: A | Discharge status: Home / Self Care | Payer: BC Managed Care – PPO | Source: Ambulatory Visit | Attending: Adult Health | Admitting: Adult Health

## 2017-07-29 ENCOUNTER — Ambulatory Visit (HOSPITAL_BASED_OUTPATIENT_CLINIC_OR_DEPARTMENT_OTHER): Payer: BC Managed Care – PPO | Admitting: Adult Health

## 2017-07-29 ENCOUNTER — Encounter: Payer: Self-pay | Admitting: Adult Health

## 2017-07-29 ENCOUNTER — Telehealth: Payer: Self-pay

## 2017-07-29 ENCOUNTER — Other Ambulatory Visit (HOSPITAL_BASED_OUTPATIENT_CLINIC_OR_DEPARTMENT_OTHER): Payer: BC Managed Care – PPO

## 2017-07-29 VITALS — BP 112/73 | HR 106 | Temp 98.5°F | Resp 18 | Ht 65.0 in | Wt 216.7 lb

## 2017-07-29 DIAGNOSIS — C50412 Malignant neoplasm of upper-outer quadrant of left female breast: Secondary | ICD-10-CM

## 2017-07-29 DIAGNOSIS — Z171 Estrogen receptor negative status [ER-]: Principal | ICD-10-CM

## 2017-07-29 DIAGNOSIS — R51 Headache: Secondary | ICD-10-CM

## 2017-07-29 DIAGNOSIS — M5134 Other intervertebral disc degeneration, thoracic region: Secondary | ICD-10-CM | POA: Insufficient documentation

## 2017-07-29 DIAGNOSIS — M545 Low back pain, unspecified: Secondary | ICD-10-CM

## 2017-07-29 DIAGNOSIS — E119 Type 2 diabetes mellitus without complications: Secondary | ICD-10-CM

## 2017-07-29 DIAGNOSIS — R197 Diarrhea, unspecified: Secondary | ICD-10-CM

## 2017-07-29 LAB — COMPREHENSIVE METABOLIC PANEL
ALK PHOS: 79 U/L (ref 40–150)
ALT: 27 U/L (ref 0–55)
AST: 41 U/L — ABNORMAL HIGH (ref 5–34)
Albumin: 3.4 g/dL — ABNORMAL LOW (ref 3.5–5.0)
Anion Gap: 10 mEq/L (ref 3–11)
BILIRUBIN TOTAL: 0.7 mg/dL (ref 0.20–1.20)
BUN: 12.8 mg/dL (ref 7.0–26.0)
CO2: 24 mEq/L (ref 22–29)
CREATININE: 1 mg/dL (ref 0.6–1.1)
Calcium: 8.2 mg/dL — ABNORMAL LOW (ref 8.4–10.4)
Chloride: 101 mEq/L (ref 98–109)
EGFR: >60 mL/min/{1.73_m2} (ref >60)
Glucose: 229 mg/dl — ABNORMAL HIGH (ref 70–140)
Potassium: 4 mEq/L (ref 3.5–5.1)
SODIUM: 136 meq/L (ref 136–145)
Total Protein: 7.1 g/dL (ref 6.4–8.3)

## 2017-07-29 LAB — CBC WITH DIFFERENTIAL/PLATELET
BASO%: 0.8 % (ref 0.0–2.0)
Basophils Absolute: 0 10*3/uL (ref 0.0–0.1)
EOS%: 0.2 % (ref 0.0–7.0)
Eosinophils Absolute: 0 10*3/uL (ref 0.0–0.5)
HCT: 33.3 % — ABNORMAL LOW (ref 34.8–46.6)
HEMOGLOBIN: 11.1 g/dL — AB (ref 11.6–15.9)
LYMPH%: 7.1 % — AB (ref 14.0–49.7)
MCH: 28.7 pg (ref 25.1–34.0)
MCHC: 33.3 g/dL (ref 31.5–36.0)
MCV: 86.2 fL (ref 79.5–101.0)
MONO#: 0.2 10*3/uL (ref 0.1–0.9)
MONO%: 5.3 % (ref 0.0–14.0)
NEUT%: 86.6 % — ABNORMAL HIGH (ref 38.4–76.8)
NEUTROS ABS: 3.6 10*3/uL (ref 1.5–6.5)
Platelets: 166 10*3/uL (ref 145–400)
RBC: 3.86 10*6/uL (ref 3.70–5.45)
RDW: 15.2 % — AB (ref 11.2–14.5)
WBC: 4.1 10*3/uL (ref 3.9–10.3)
lymph#: 0.3 10*3/uL — ABNORMAL LOW (ref 0.9–3.3)

## 2017-07-29 LAB — MAGNESIUM: MAGNESIUM: 1.2 mg/dL — AB (ref 1.5–2.5)

## 2017-07-29 MED ORDER — TRAMADOL HCL 50 MG PO TABS: 50.0000 mg | ORAL_TABLET | Freq: Four times a day (QID) | ORAL | 0 refills | Status: DC | PRN

## 2017-07-29 NOTE — Telephone Encounter (Signed)
Pt had taxol on Thursday. Pt had blood on Saturday. She now has low back pain, headache and diarrhea.  LBP started Sunday morning. Not radiating. Throbbing. 8/10. Ibuprofen eased it a little. Heat did not help. Is worse today.  Headache. Started on friday before she got the blood. She feels it is more like her sinuses. She is having little nose bleeds. It is throbbing. 8/10. She is using claritin. Diarrhea. Started Saturday after she got home. She had 4-5 stools yesterday, 2 x today. Soft stool. Irritating her hemorrhoids. Not used immodium.

## 2017-07-29 NOTE — Telephone Encounter (Signed)
S/w Dr Lindi Adie who said to have her come in. S/w Ria Comment and she will see pt. Pt stated she can be at [facility] about 1245. inbasket sent.

## 2017-07-29 NOTE — Assessment & Plan Note (Signed)
04/30/2017: Left lumpectomy: IDC grade 3, 1.7 cm, DCIS, lymphovascular invasion present, margins negative, 0/1 lymph node negative, ER 0%, PR 0%, HER-2 negative ratio 1.37, Ki-67 40%, T1c N0 stage IB  Pathology counseling: I discussed the final pathology report of the patient provided a copy of this report. I discussed the margins as well as lymph node surgeries. We also discussed the final staging along with previously performed ER/PR and HER-2/neu testing.  Recommendation: 1. adjuvant chemotherapy with dose dense Adriamycin and Cytoxan 4 followed by Taxol weekly 12 ( cannot use steroids because patient is diabetic) 2. Followed by radiation --------------------------------------------------------------------------------------------------------------------------- Current treatment: Completed 4 cycles ofdose dense Adriamycin and Cytoxan, currently receiving Taxol  Audrey Peters is experiencing some symptoms from her chemotherapy.  1. Diarrhea: Imodium per package instructions, and to call us if it doesn't help.  She is eating and drinking well.  I encouraged her to continue to keep up with her PO intake.    2. Back pain: Likely related to Taxol, first dose pain.  I prescribed Tramadol, and sent her for thoracic and lumbar plain films.   3. Headaches/blood tinged nasal drainage.  I recommended she continue taking Claritin and use flonase/saline nasal spray.    4. Hypomagnesemia: Recommended she take OTC BID.  Will re check on Friday.    Audrey Peters will return on Friday, 12/21 for labs, f/u with me, and week 2 of Taxol.  I have already reviewed her premeds with Dr. Lindi Adie.  Due to hyperglycemia with Dexamethasone with cycle one, she will not receive it with cycle 2.  She will also receive Benadryl PO instead of IV.

## 2017-07-29 NOTE — Progress Notes (Signed)
[facility] Follow up:    [name], MD [address] [place] [address]   DIAGNOSIS: Cancer Staging Malignant neoplasm of upper-outer quadrant of left breast in female, estrogen receptor negative ([place]) Staging form: Breast, AJCC 8th Edition - Clinical: No stage assigned - Unsigned - Pathologic: Stage IB (pT1c, pN0, cM0, G3, ER: Negative, PR: Negative, HER2: Negative) - Signed by Eppie Gibson, MD on 05/14/2017   SUMMARY OF ONCOLOGIC HISTORY:   Malignant neoplasm of upper-outer quadrant of left breast in female, estrogen receptor negative ([place])   04/05/2017 Initial Diagnosis    Left breast asymmetry by ultrasound measured 1.3 cm at 2:30 position 10 cm from nipple, no axillary lymph nodes; biopsy IDC grade 2, ER 0%, PR 0%, HER-2 negative ratio 1.37, Ki-67 40%, T1c N0 stage IB AJCC 8       04/30/2017 Surgery    Left lumpectomy: IDC grade 3, 1.7 cm, DCIS, lymphovascular invasion present, margins negative, 0/1 lymph node negative, ER 0%, PR 0%, HER-2 negative ratio 1.37, Ki-67 40%, T1c N0 stage IB      05/22/2017 Genetic Testing    Patient had genetic testing due to a personal history of triple negative breast cancer.  The Common Hereditary Cancer Panel was ordered. The Hereditary Gene Panel offered by [facility] includes sequencing and/or deletion duplication testing of the following 46 genes: APC, ATM, AXIN2, BARD1, BMPR1A, BRCA1, BRCA2, BRIP1, CDH1, CDKN2A (p14ARF), CDKN2A (p16INK4a), CHEK2, CTNNA1, DICER1, EPCAM (Deletion/duplication testing only), GREM1 (promoter region deletion/duplication testing only), KIT, MEN1, MLH1, MSH2, MSH3, MSH6, MUTYH, NBN, NF1, NHTL1, PALB2, PDGFRA, PMS2, POLD1, POLE, PTEN, RAD50, RAD51C, RAD51D, SDHB, SDHC, SDHD, SMAD4, SMARCA4. STK11, TP53, TSC1, TSC2, and VHL.  The following genes were evaluated for sequence changes only: SDHA and HOXB13 c.251G>A variant only.    Results: No pathogenic mutations identified.  A VUS in  ATM c.4279G>A (p.Ala1427Thr) was identified.  The date of this test report is 05/22/2017.       05/24/2017 -  Chemotherapy    Dose dense Adriamycin and Cytoxan 4 followed by Taxol weekly 12        CURRENT THERAPY: Taxol cycle 1 given on 12/13  INTERVAL HISTORY: Audrey Peters 61 y.o. female returns for follow up after receiving Taxol about 4-5 days ago.  She is here for an urgent appointment due to diarrhea, back pain, and a headache.  The diarrhea began Saturday (two days ago) and has continued to today.  She had 4-5 bowel movements yesterday.  This was associated with abdominal cramping.  She denies blood, mucous, pus, or black tarry like stool consistency.  She says it wasn't watery, but very loose.  No recent antibiotic use.    The back pain is occurring in the lower back and began on Saturday.  Its about the same.  She rates the pain on 7-8/10 most of the time.  No precipitating factor.  She denies any alleviating and aggravating factors.  She denies any lower extremity weakness, numbness, bowel/bladder incontinence, saddle anesthesia.  The pain is located in her midline lower back and radiates laterally.    She is also experiencing a headache.  These have been going on frequently.  She has been taking Claritin.  She says she is getting enough water to drink.  She did take Ibuprofen for her headache.  It helped a little bit.  She has her headaches that are near her forehead/frontal sinuses.  She also has had some blood tinged mucous  noted when blowing her nose.     Patient Active Problem List   Diagnosis Date Noted  . Encounter for antineoplastic chemotherapy 06/07/2017  . Port-A-Cath in place 05/24/2017  . Genetic testing 05/23/2017  . Malignant neoplasm of upper-outer quadrant of left breast in female, estrogen receptor negative ([place]) 04/22/2017  . Essential hypertension 01/30/2015  . Diabetes mellitus ([place]) 01/30/2015    is allergic to invokana [canagliflozin]; sulfa  antibiotics; and ciprofloxacin.  MEDICAL HISTORY: Past Medical History:  Diagnosis Date  . Anemia yrs ago  . Arthritis   . Breast cancer ([place])   . Cancer [facility])    recent dx in breast  . Carpal tunnel syndrome of right wrist   . Diabetes mellitus without complication ([place])    dx 2008  . Headache    sinus  . Hypertension   . Personal history of chemotherapy   . Sleep apnea    does not use cpap  . Vaginal delivery 1983    SURGICAL HISTORY: Past Surgical History:  Procedure Laterality Date  . BREAST LUMPECTOMY Left   . BREAST LUMPECTOMY WITH RADIOACTIVE SEED AND SENTINEL LYMPH NODE BIOPSY Left 04/30/2017   Procedure: LEFT BREAST LUMPECTOMY WITH RADIOACTIVE SEED AND LEFT SENTINEL LYMPH NODE BIOPSY ERAS PATHWAY;  Surgeon: Erroll Luna, MD;  Location: [facility];  Service: General;  Laterality: Left;  . COLONOSCOPY WITH PROPOFOL N/A 05/28/2016   Procedure: COLONOSCOPY WITH PROPOFOL;  Surgeon: Garlan Fair, MD;  Location: [facility];  Service: Endoscopy;  Laterality: N/A;  . DILATION AND CURETTAGE OF UTERUS    . HYSTEROSCOPY W/D&C N/A 07/21/2015   Procedure: DILATATION AND CURETTAGE /HYSTEROSCOPY with myosure;  Surgeon: Janyth Pupa, DO;  Location: [facility];  Service: Gynecology;  Laterality: N/A;  . PORTACATH PLACEMENT Right 04/30/2017   Procedure: INSERTION PORT-A-CATH;  Surgeon: Erroll Luna, MD;  Location: [facility];  Service: General;  Laterality: Right;    SOCIAL HISTORY: Social History   Socioeconomic History  . Marital status: Married    Spouse name: Not on file  . Number of children: Not on file  . Years of education: Not on file  . Highest education level: Not on file  Social Needs  . Financial resource strain: Not on file  . Food insecurity - worry: Not on file  . Food insecurity - inability: Not on file  . Transportation needs - medical: Not on file  . Transportation needs - non-medical: Not on file  Occupational History  . Not on file  Tobacco Use  . Smoking  status: Never Smoker  . Smokeless tobacco: Never Used  Substance and Sexual Activity  . Alcohol use: Yes    Alcohol/week: 0.0 oz    Comment: occ wine  . Drug use: No  . Sexual activity: Not on file  Other Topics Concern  . Not on file  Social History Narrative  . Not on file    FAMILY HISTORY: Family History  Problem Relation Age of Onset  . Diabetes Mother   . Hypertension Mother     Review of Systems  Constitutional: Negative for appetite change, chills, diaphoresis, fatigue, fever and unexpected weight change.  HENT:   Negative for hearing loss, lump/mass and trouble swallowing.   Eyes: Negative for eye problems and icterus.  Respiratory: Negative for chest tightness, cough and shortness of breath.   Cardiovascular: Negative for chest pain, leg swelling and palpitations.  Gastrointestinal: Positive for diarrhea (see HPI). Negative for abdominal distention, abdominal pain, constipation, nausea, rectal pain and  vomiting.  Endocrine: Negative for hot flashes.  Genitourinary: Negative for difficulty urinating.   Musculoskeletal: Positive for back pain (see HPI). Negative for arthralgias and gait problem.  Skin: Negative for itching and rash.  Neurological: Positive for headaches (see hpi). Negative for dizziness, extremity weakness, gait problem, light-headedness, numbness, seizures and speech difficulty.  Hematological: Negative for adenopathy. Does not bruise/bleed easily.  Psychiatric/Behavioral: Negative for depression. The patient is not nervous/anxious.       PHYSICAL EXAMINATION  ECOG PERFORMANCE STATUS: 1 - Symptomatic but completely ambulatory  Vitals:   07/29/17 1343  BP: 112/73  Pulse: (!) 106  Resp: 18  Temp: 98.5 F (36.9 C)  SpO2: 100%    Physical Exam  Constitutional: She is oriented to person, place, and time and well-developed, well-nourished, and in no distress.  HENT:  Head: Normocephalic and atraumatic.  Mouth/Throat: Oropharynx is clear and  moist. No oropharyngeal exudate.  Eyes: Pupils are equal, round, and reactive to light. No scleral icterus.  Neck: Neck supple.  Cardiovascular: Normal rate, regular rhythm and normal heart sounds.  Pulmonary/Chest: Effort normal. No respiratory distress. She has no wheezes. She has no rales. She exhibits no tenderness.  Abdominal: Soft. Bowel sounds are normal. She exhibits no distension. There is no tenderness.  Musculoskeletal: She exhibits no edema.  TTP at L1  Lymphadenopathy:    She has no cervical adenopathy.  Neurological: She is alert and oriented to person, place, and time.  Skin: Skin is warm and dry. No rash noted.  Psychiatric: Mood and affect normal.    LABORATORY DATA:  CBC    Component Value Date/Time   WBC 4.1 07/29/2017 1328   WBC 8.2 04/25/2017 0946   RBC 3.86 07/29/2017 1328   RBC 4.56 04/25/2017 0946   HGB 11.1 (L) 07/29/2017 1328   HCT 33.3 (L) 07/29/2017 1328   PLT 166 07/29/2017 1328   MCV 86.2 07/29/2017 1328   MCH 28.7 07/29/2017 1328   MCH 27.9 04/25/2017 0946   MCHC 33.3 07/29/2017 1328   MCHC 31.4 04/25/2017 0946   RDW 15.2 (H) 07/29/2017 1328   LYMPHSABS 0.3 (L) 07/29/2017 1328   MONOABS 0.2 07/29/2017 1328   EOSABS 0.0 07/29/2017 1328   BASOSABS 0.0 07/29/2017 1328    CMP     Component Value Date/Time   NA 136 07/29/2017 1328   K 4.0 07/29/2017 1328   CL 105 04/25/2017 0946   CO2 24 07/29/2017 1328   GLUCOSE 229 (H) 07/29/2017 1328   BUN 12.8 07/29/2017 1328   CREATININE 1.0 07/29/2017 1328   CALCIUM 8.2 (L) 07/29/2017 1328   PROT 7.1 07/29/2017 1328   ALBUMIN 3.4 (L) 07/29/2017 1328   AST 41 (H) 07/29/2017 1328   ALT 27 07/29/2017 1328   ALKPHOS 79 07/29/2017 1328   BILITOT 0.70 07/29/2017 1328   GFRNONAA >60 04/25/2017 0946   GFRAA >60 04/25/2017 0946    ASSESSMENT and PLAN:   Malignant neoplasm of upper-outer quadrant of left breast in female, estrogen receptor negative ([place]) 04/30/2017: Left lumpectomy: IDC grade 3, 1.7  cm, DCIS, lymphovascular invasion present, margins negative, 0/1 lymph node negative, ER 0%, PR 0%, HER-2 negative ratio 1.37, Ki-67 40%, T1c N0 stage IB  Pathology counseling: I discussed the final pathology report of the patient provided a copy of this report. I discussed the margins as well as lymph node surgeries. We also discussed the final staging along with previously performed ER/PR and HER-2/neu testing.  Recommendation: 1. adjuvant chemotherapy with dose  dense Adriamycin and Cytoxan 4 followed by Taxol weekly 12 ( cannot use steroids because patient is diabetic) 2. Followed by radiation --------------------------------------------------------------------------------------------------------------------------- Current treatment: Completed 4 cycles ofdose dense Adriamycin and Cytoxan, currently receiving Taxol  Savera is experiencing some symptoms from her chemotherapy.  1. Diarrhea: Imodium per package instructions, and to call us if it doesn't help.  She is eating and drinking well.  I encouraged her to continue to keep up with her PO intake.    2. Back pain: Likely related to Taxol, first dose pain.  I prescribed Tramadol, and sent her for thoracic and lumbar plain films.   3. Headaches/blood tinged nasal drainage.  I recommended she continue taking Claritin and use flonase/saline nasal spray.    4. Hypomagnesemia: Recommended she take OTC BID.  Will re check on Friday.    Kriti will return on Friday, 12/21 for labs, f/u with me, and week 2 of Taxol.  I have already reviewed her premeds with Dr. Lindi Adie.  Due to hyperglycemia with Dexamethasone with cycle one, she will not receive it with cycle 2.  She will also receive Benadryl PO instead of IV.         Orders Placed This Encounter  Procedures  . DG Lumbar Spine 2-3 Views    Standing Status:   Future    Number of Occurrences:   1    Standing Expiration Date:   07/29/2018    Order Specific Question:   Reason for Exam  (SYMPTOM  OR DIAGNOSIS REQUIRED)    Answer:   back pain, breast cancer    Order Specific Question:   Is patient pregnant?    Answer:   No    Order Specific Question:   Preferred imaging location?    Answer:   [facility]    Order Specific Question:   Radiology Contrast Protocol - do NOT remove file path    Answer:   [email or web address]  . DG Thoracic Spine 2 View    Standing Status:   Future    Number of Occurrences:   1    Standing Expiration Date:   07/29/2018    Order Specific Question:   Reason for Exam (SYMPTOM  OR DIAGNOSIS REQUIRED)    Answer:   bresat cancer back pain    Order Specific Question:   Is patient pregnant?    Answer:   No    Order Specific Question:   Preferred imaging location?    Answer:   [facility]    Order Specific Question:   Radiology Contrast Protocol - do NOT remove file path    Answer:   [email or web address]    All questions were answered. The patient knows to call the clinic with any problems, questions or concerns. We can certainly see the patient much sooner if necessary.  A total of (30) minutes of face-to-face time was spent with this patient with greater than 50% of that time in counseling and care-coordination.  This note was electronically signed. Scot Dock, NP 07/29/2017

## 2017-07-30 ENCOUNTER — Telehealth: Payer: Self-pay | Admitting: Adult Health

## 2017-07-30 NOTE — Telephone Encounter (Signed)
No 12/17 los °

## 2017-08-02 ENCOUNTER — Ambulatory Visit: Payer: BC Managed Care – PPO

## 2017-08-02 ENCOUNTER — Other Ambulatory Visit (HOSPITAL_BASED_OUTPATIENT_CLINIC_OR_DEPARTMENT_OTHER): Payer: BC Managed Care – PPO

## 2017-08-02 ENCOUNTER — Other Ambulatory Visit: Payer: BC Managed Care – PPO

## 2017-08-02 ENCOUNTER — Encounter: Payer: Self-pay | Admitting: Adult Health

## 2017-08-02 ENCOUNTER — Ambulatory Visit (HOSPITAL_BASED_OUTPATIENT_CLINIC_OR_DEPARTMENT_OTHER): Payer: BC Managed Care – PPO | Admitting: Adult Health

## 2017-08-02 VITALS — BP 97/70 | HR 98 | Temp 98.6°F | Resp 18 | Ht 65.0 in | Wt 217.5 lb

## 2017-08-02 DIAGNOSIS — R7989 Other specified abnormal findings of blood chemistry: Secondary | ICD-10-CM

## 2017-08-02 DIAGNOSIS — R197 Diarrhea, unspecified: Secondary | ICD-10-CM

## 2017-08-02 DIAGNOSIS — Z95828 Presence of other vascular implants and grafts: Secondary | ICD-10-CM

## 2017-08-02 DIAGNOSIS — C50412 Malignant neoplasm of upper-outer quadrant of left female breast: Secondary | ICD-10-CM

## 2017-08-02 DIAGNOSIS — Z171 Estrogen receptor negative status [ER-]: Secondary | ICD-10-CM

## 2017-08-02 LAB — COMPREHENSIVE METABOLIC PANEL
ALBUMIN: 3.3 g/dL — AB (ref 3.5–5.0)
ALK PHOS: 68 U/L (ref 40–150)
ALT: 32 U/L (ref 0–55)
AST: 60 U/L — ABNORMAL HIGH (ref 5–34)
Anion Gap: 12 mEq/L — ABNORMAL HIGH (ref 3–11)
BUN: 31.2 mg/dL — ABNORMAL HIGH (ref 7.0–26.0)
CALCIUM: 8.5 mg/dL (ref 8.4–10.4)
CO2: 21 mEq/L — ABNORMAL LOW (ref 22–29)
Chloride: 103 mEq/L (ref 98–109)
Creatinine: 1.9 mg/dL — ABNORMAL HIGH (ref 0.6–1.1)
EGFR: 33 mL/min/{1.73_m2} — AB (ref >60)
GLUCOSE: 243 mg/dL — AB (ref 70–140)
POTASSIUM: 3.6 meq/L (ref 3.5–5.1)
SODIUM: 135 meq/L — AB (ref 136–145)
Total Bilirubin: 0.42 mg/dL (ref 0.20–1.20)
Total Protein: 7 g/dL (ref 6.4–8.3)

## 2017-08-02 LAB — CBC WITH DIFFERENTIAL/PLATELET
BASO%: 0.7 % (ref 0.0–2.0)
Basophils Absolute: 0 10*3/uL (ref 0.0–0.1)
EOS%: 0.2 % (ref 0.0–7.0)
Eosinophils Absolute: 0 10*3/uL (ref 0.0–0.5)
HEMATOCRIT: 30.8 % — AB (ref 34.8–46.6)
HGB: 10.2 g/dL — ABNORMAL LOW (ref 11.6–15.9)
LYMPH#: 0.8 10*3/uL — AB (ref 0.9–3.3)
LYMPH%: 16 % (ref 14.0–49.7)
MCH: 28.8 pg (ref 25.1–34.0)
MCHC: 33 g/dL (ref 31.5–36.0)
MCV: 87.4 fL (ref 79.5–101.0)
MONO#: 0.8 10*3/uL (ref 0.1–0.9)
MONO%: 16.4 % — ABNORMAL HIGH (ref 0.0–14.0)
NEUT%: 66.7 % (ref 38.4–76.8)
NEUTROS ABS: 3.1 10*3/uL (ref 1.5–6.5)
PLATELETS: 265 10*3/uL (ref 145–400)
RBC: 3.52 10*6/uL — ABNORMAL LOW (ref 3.70–5.45)
RDW: 15.2 % — ABNORMAL HIGH (ref 11.2–14.5)
WBC: 4.7 10*3/uL (ref 3.9–10.3)

## 2017-08-02 MED ORDER — SODIUM CHLORIDE 0.9 % IV SOLN
INTRAVENOUS | Status: DC
  Administered 2017-08-02: 15:00:00 via INTRAVENOUS

## 2017-08-02 MED ORDER — SODIUM CHLORIDE 0.9% FLUSH
10.0000 mL | INTRAVENOUS | Status: DC | PRN
  Administered 2017-08-02: 10 mL via INTRAVENOUS
  Filled 2017-08-02: qty 10

## 2017-08-02 NOTE — Progress Notes (Signed)
[facility] Follow up:    Audrey Carol, MD [address] [place] [address]   DIAGNOSIS: Cancer Staging Malignant neoplasm of upper-outer quadrant of left breast in female, estrogen receptor negative ([place]) Staging form: Breast, AJCC 8th Edition - Clinical: No stage assigned - Unsigned - Pathologic: Stage IB (pT1c, pN0, cM0, G3, ER: Negative, PR: Negative, HER2: Negative) - Signed by Eppie Gibson, MD on 05/14/2017   SUMMARY OF ONCOLOGIC HISTORY:   Malignant neoplasm of upper-outer quadrant of left breast in female, estrogen receptor negative ([place])   04/05/2017 Initial Diagnosis    Left breast asymmetry by ultrasound measured 1.3 cm at 2:30 position 10 cm from nipple, no axillary lymph nodes; biopsy IDC grade 2, ER 0%, PR 0%, HER-2 negative ratio 1.37, Ki-67 40%, T1c N0 stage IB AJCC 8       04/30/2017 Surgery    Left lumpectomy: IDC grade 3, 1.7 cm, DCIS, lymphovascular invasion present, margins negative, 0/1 lymph node negative, ER 0%, PR 0%, HER-2 negative ratio 1.37, Ki-67 40%, T1c N0 stage IB      05/22/2017 Genetic Testing    Patient had genetic testing due to a personal history of triple negative breast cancer.  The Common Hereditary Cancer Panel was ordered. The Hereditary Gene Panel offered by [facility] includes sequencing and/or deletion duplication testing of the following 46 genes: APC, ATM, AXIN2, BARD1, BMPR1A, BRCA1, BRCA2, BRIP1, CDH1, CDKN2A (p14ARF), CDKN2A (p16INK4a), CHEK2, CTNNA1, DICER1, EPCAM (Deletion/duplication testing only), GREM1 (promoter region deletion/duplication testing only), KIT, MEN1, MLH1, MSH2, MSH3, MSH6, MUTYH, NBN, NF1, NHTL1, PALB2, PDGFRA, PMS2, POLD1, POLE, PTEN, RAD50, RAD51C, RAD51D, SDHB, SDHC, SDHD, SMAD4, SMARCA4. STK11, TP53, TSC1, TSC2, and VHL.  The following genes were evaluated for sequence changes only: SDHA and HOXB13 c.251G>A variant only.    Results: No pathogenic mutations identified.  A VUS in  ATM c.4279G>A (p.Ala1427Thr) was identified.  The date of this test report is 05/22/2017.       05/24/2017 -  Chemotherapy    Dose dense Adriamycin and Cytoxan 4 followed by Taxol weekly 12        CURRENT THERAPY: Taxol week 2  INTERVAL HISTORY: Audrey Peters 61 y.o. female returns for evaluation prior to receiving Taxol.  She tolerated the last week moderately well.  She had diarrhea and was seen on Monday.  She was instructed on how to take imodium.  Her creatinine was normal at that time.  She says her diarrhea has almost resolved and she is going about once per day.  She says she is drinking enough fluids.     Patient Active Problem List   Diagnosis Date Noted  . Encounter for antineoplastic chemotherapy 06/07/2017  . Port-A-Cath in place 05/24/2017  . Genetic testing 05/23/2017  . Malignant neoplasm of upper-outer quadrant of left breast in female, estrogen receptor negative ([place]) 04/22/2017  . Essential hypertension 01/30/2015  . Diabetes mellitus ([place]) 01/30/2015    is allergic to invokana [canagliflozin]; sulfa antibiotics; and ciprofloxacin.  MEDICAL HISTORY: Past Medical History:  Diagnosis Date  . Anemia yrs ago  . Arthritis   . Breast cancer ([place])   . Cancer [facility])    recent dx in breast  . Carpal tunnel syndrome of right wrist   . Diabetes mellitus without complication ([place])    dx 2008  . Headache    sinus  . Hypertension   . Personal history of chemotherapy   . Sleep apnea    does  not use cpap  . Vaginal delivery 1983    SURGICAL HISTORY: Past Surgical History:  Procedure Laterality Date  . BREAST LUMPECTOMY Left   . BREAST LUMPECTOMY WITH RADIOACTIVE SEED AND SENTINEL LYMPH NODE BIOPSY Left 04/30/2017   Procedure: LEFT BREAST LUMPECTOMY WITH RADIOACTIVE SEED AND LEFT SENTINEL LYMPH NODE BIOPSY ERAS PATHWAY;  Surgeon: Erroll Luna, MD;  Location: [facility];  Service: General;  Laterality: Left;  . COLONOSCOPY WITH PROPOFOL N/A 05/28/2016    Procedure: COLONOSCOPY WITH PROPOFOL;  Surgeon: Garlan Fair, MD;  [facility];  Service: Endoscopy;  Laterality: N/A;  . DILATION AND CURETTAGE OF UTERUS    . HYSTEROSCOPY W/D&C N/A 07/21/2015   Procedure: DILATATION AND CURETTAGE /HYSTEROSCOPY with myosure;  Surgeon: Janyth Pupa, DO;  Location: [facility];  Service: Gynecology;  Laterality: N/A;  . PORTACATH PLACEMENT Right 04/30/2017   Procedure: INSERTION PORT-A-CATH;  Surgeon: Erroll Luna, MD;  Location: [facility];  Service: General;  Laterality: Right;    SOCIAL HISTORY: Social History   Socioeconomic History  . Marital status: Married    Spouse name: Not on file  . Number of children: Not on file  . Years of education: Not on file  . Highest education level: Not on file  Social Needs  . Financial resource strain: Not on file  . Food insecurity - worry: Not on file  . Food insecurity - inability: Not on file  . Transportation needs - medical: Not on file  . Transportation needs - non-medical: Not on file  Occupational History  . Not on file  Tobacco Use  . Smoking status: Never Smoker  . Smokeless tobacco: Never Used  Substance and Sexual Activity  . Alcohol use: Yes    Alcohol/week: 0.0 oz    Comment: occ wine  . Drug use: No  . Sexual activity: Not on file  Other Topics Concern  . Not on file  Social History Narrative  . Not on file    FAMILY HISTORY: Family History  Problem Relation Age of Onset  . Diabetes Mother   . Hypertension Mother     Review of Systems  Constitutional: Negative for appetite change, chills, fatigue, fever and unexpected weight change.  HENT:   Negative for hearing loss and lump/mass.   Eyes: Negative for eye problems and icterus.  Respiratory: Negative for chest tightness, cough and shortness of breath.   Cardiovascular: Negative for chest pain, leg swelling and palpitations.  Gastrointestinal: Negative for abdominal distention, abdominal pain, constipation, diarrhea,  nausea, rectal pain and vomiting.  Endocrine: Negative for hot flashes.  Musculoskeletal: Negative for arthralgias.  Skin: Negative for itching and rash.  Neurological: Negative for dizziness, extremity weakness, headaches and numbness.  Hematological: Negative for adenopathy.  Psychiatric/Behavioral: Negative for depression. The patient is not nervous/anxious.       PHYSICAL EXAMINATION  ECOG PERFORMANCE STATUS: 1 - Symptomatic but completely ambulatory  Vitals:   08/02/17 1316  BP: 97/70  Pulse: 98  Resp: 18  Temp: 98.6 F (37 C)  SpO2: 100%    Physical Exam  Constitutional: She is oriented to person, place, and time and well-developed, well-nourished, and in no distress.  HENT:  Head: Normocephalic and atraumatic.  Mouth/Throat: No oropharyngeal exudate.  Eyes: Pupils are equal, round, and reactive to light. No scleral icterus.  Neck: Neck supple.  Cardiovascular: Normal rate, regular rhythm, normal heart sounds and intact distal pulses.  Pulmonary/Chest: Effort normal and breath sounds normal. No respiratory distress. She has no wheezes.  She has no rales.  Abdominal: Soft. Bowel sounds are normal. She exhibits no distension. There is no tenderness.  Musculoskeletal: She exhibits no edema.  Lymphadenopathy:    She has no cervical adenopathy.  Neurological: She is alert and oriented to person, place, and time.  Psychiatric: Mood and affect normal.    LABORATORY DATA:  CBC    Component Value Date/Time   WBC 4.7 08/02/2017 1200   WBC 8.2 04/25/2017 0946   RBC 3.52 (L) 08/02/2017 1200   RBC 4.56 04/25/2017 0946   HGB 10.2 (L) 08/02/2017 1200   HCT 30.8 (L) 08/02/2017 1200   PLT 265 08/02/2017 1200   MCV 87.4 08/02/2017 1200   MCH 28.8 08/02/2017 1200   MCH 27.9 04/25/2017 0946   MCHC 33.0 08/02/2017 1200   MCHC 31.4 04/25/2017 0946   RDW 15.2 (H) 08/02/2017 1200   LYMPHSABS 0.8 (L) 08/02/2017 1200   MONOABS 0.8 08/02/2017 1200   EOSABS 0.0 08/02/2017 1200    BASOSABS 0.0 08/02/2017 1200    CMP     Component Value Date/Time   NA 135 (L) 08/02/2017 1200   K 3.6 08/02/2017 1200   CL 105 04/25/2017 0946   CO2 21 (L) 08/02/2017 1200   GLUCOSE 243 (H) 08/02/2017 1200   BUN 31.2 (H) 08/02/2017 1200   CREATININE 1.9 (H) 08/02/2017 1200   CALCIUM 8.5 08/02/2017 1200   PROT 7.0 08/02/2017 1200   ALBUMIN 3.3 (L) 08/02/2017 1200   AST 60 (H) 08/02/2017 1200   ALT 32 08/02/2017 1200   ALKPHOS 68 08/02/2017 1200   BILITOT 0.42 08/02/2017 1200   GFRNONAA >60 04/25/2017 0946   GFRAA >60 04/25/2017 0946   ASSESSMENT and PLAN:   Malignant neoplasm of upper-outer quadrant of left breast in female, estrogen receptor negative ([place]) 04/30/2017: Left lumpectomy: IDC grade 3, 1.7 cm, DCIS, lymphovascular invasion present, margins negative, 0/1 lymph node negative, ER 0%, PR 0%, HER-2 negative ratio 1.37, Ki-67 40%, T1c N0 stage IB  Pathology counseling: I discussed the final pathology report of the patient provided a copy of this report. I discussed the margins as well as lymph node surgeries. We also discussed the final staging along with previously performed ER/PR and HER-2/neu testing.  Recommendation: 1. adjuvant chemotherapy with dose dense Adriamycin and Cytoxan 4 followed by Taxol weekly 12 ( cannot use steroids because patient is diabetic) 2. Followed by radiation --------------------------------------------------------------------------------------------------------------------------- Current treatment: Completed 4 cycles ofdose dense Adriamycin and Cytoxan, currently receiving Taxol  Today is week 2, however her creatinine is up to 1.9 today ? Related to dehydration from previous diarrhea.  I recommended that she call her endocrinologist and discuss him changing her metformin due to diarrhea and elevated creatinine today.  She will not receive chemotherapy.  We will give her one liter of fluids instead.  I discontinued meloxicam,  ibuprofen, and Maxzide from her medication list.  Her blood pressure was low normal today.   We will see Audrey Peters again next week for labs, f/u and hopefully cycle 2 of Taxol.        All questions were answered. The patient knows to call the clinic with any problems, questions or concerns. We can certainly see the patient much sooner if necessary.  A total of (30) minutes of face-to-face time was spent with this patient with greater than 50% of that time in counseling and care-coordination.  This note was electronically signed. Scot Dock, NP 08/02/2017

## 2017-08-02 NOTE — Patient Instructions (Signed)
Implanted Port Home Guide An implanted port is a type of central line that is placed under the skin. Central lines are used to provide IV access when treatment or nutrition needs to be given through a person's veins. Implanted ports are used for long-term IV access. An implanted port may be placed because:  You need IV medicine that would be irritating to the small veins in your hands or arms.  You need long-term IV medicines, such as antibiotics.  You need IV nutrition for a long period.  You need frequent blood draws for lab tests.  You need dialysis.  Implanted ports are usually placed in the chest area, but they can also be placed in the upper arm, the abdomen, or the leg. An implanted port has two main parts:  Reservoir. The reservoir is round and will appear as a small, raised area under your skin. The reservoir is the part where a needle is inserted to give medicines or draw blood.  Catheter. The catheter is a thin, flexible tube that extends from the reservoir. The catheter is placed into a large vein. Medicine that is inserted into the reservoir goes into the catheter and then into the vein.  How will I care for my incision site? Do not get the incision site wet. Bathe or shower as directed by your health care provider. How is my port accessed? Special steps must be taken to access the port:  Before the port is accessed, a numbing cream can be placed on the skin. This helps numb the skin over the port site.  Your health care provider uses a sterile technique to access the port. ? Your health care provider must put on a mask and sterile gloves. ? The skin over your port is cleaned carefully with an antiseptic and allowed to dry. ? The port is gently pinched between sterile gloves, and a needle is inserted into the port.  Only "non-coring" port needles should be used to access the port. Once the port is accessed, a blood return should be checked. This helps ensure that the port  is in the vein and is not clogged.  If your port needs to remain accessed for a constant infusion, a clear (transparent) bandage will be placed over the needle site. The bandage and needle will need to be changed every week, or as directed by your health care provider.  Keep the bandage covering the needle clean and dry. Do not get it wet. Follow your health care provider's instructions on how to take a shower or bath while the port is accessed.  If your port does not need to stay accessed, no bandage is needed over the port.  What is flushing? Flushing helps keep the port from getting clogged. Follow your health care provider's instructions on how and when to flush the port. Ports are usually flushed with saline solution or a medicine called heparin. The need for flushing will depend on how the port is used.  If the port is used for intermittent medicines or blood draws, the port will need to be flushed: ? After medicines have been given. ? After blood has been drawn. ? As part of routine maintenance.  If a constant infusion is running, the port may not need to be flushed.  How long will my port stay implanted? The port can stay in for as long as your health care provider thinks it is needed. When it is time for the port to come out, surgery will be   done to remove it. The procedure is similar to the one performed when the port was put in. When should I seek immediate medical care? When you have an implanted port, you should seek immediate medical care if:  You notice a bad smell coming from the incision site.  You have swelling, redness, or drainage at the incision site.  You have more swelling or pain at the port site or the surrounding area.  You have a fever that is not controlled with medicine.  This information is not intended to replace advice given to you by your health care provider. Make sure you discuss any questions you have with your health care provider. Document  Released: 07/30/2005 Document Revised: 01/05/2016 Document Reviewed: 04/06/2013 Elsevier Interactive Patient Education  2017 [employer]  

## 2017-08-02 NOTE — Patient Instructions (Signed)
Dehydration, Adult Dehydration is when there is not enough fluid or water in your body. This happens when you lose more fluids than you take in. Dehydration can range from mild to very bad. It should be treated right away to keep it from getting very bad. Symptoms of mild dehydration may include:  Thirst.  Dry lips.  Slightly dry mouth.  Dry, warm skin.  Dizziness. Symptoms of moderate dehydration may include:  Very dry mouth.  Muscle cramps.  Dark pee (urine). Pee may be the color of tea.  Your body making less pee.  Your eyes making fewer tears.  Heartbeat that is uneven or faster than normal (palpitations).  Headache.  Light-headedness, especially when you stand up from sitting.  Fainting (syncope). Symptoms of very bad dehydration may include:  Changes in skin, such as: ? Cold and clammy skin. ? Blotchy (mottled) or pale skin. ? Skin that does not quickly return to normal after being lightly pinched and let go (poor skin turgor).  Changes in body fluids, such as: ? Feeling very thirsty. ? Your eyes making fewer tears. ? Not sweating when body temperature is high, such as in hot weather. ? Your body making very little pee.  Changes in vital signs, such as: ? Weak pulse. ? Pulse that is more than 100 beats a minute when you are sitting still. ? Fast breathing. ? Low blood pressure.  Other changes, such as: ? Sunken eyes. ? Cold hands and feet. ? Confusion. ? Lack of energy (lethargy). ? Trouble waking up from sleep. ? Short-term weight loss. ? Unconsciousness. Follow these instructions at home:  If told by your doctor, drink an ORS: ? Make an ORS by using instructions on the package. ? Start by drinking small amounts, about  cup (120 mL) every 5-10 minutes. ? Slowly drink more until you have had the amount that your doctor said to have.  Drink enough clear fluid to keep your pee clear or pale yellow. If you were told to drink an ORS, finish the ORS  first, then start slowly drinking clear fluids. Drink fluids such as: ? Water. Do not drink only water by itself. Doing that can make the salt (sodium) level in your body get too low (hyponatremia). ? Ice chips. ? Fruit juice that you have added water to (diluted). ? Low-calorie sports drinks.  Avoid: ? Alcohol. ? Drinks that have a lot of sugar. These include high-calorie sports drinks, fruit juice that does not have water added, and soda. ? Caffeine. ? Foods that are greasy or have a lot of fat or sugar.  Take over-the-counter and prescription medicines only as told by your doctor.  Do not take salt tablets. Doing that can make the salt level in your body get too high (hypernatremia).  Eat foods that have minerals (electrolytes). Examples include bananas, oranges, potatoes, tomatoes, and spinach.  Keep all follow-up visits as told by your doctor. This is important. Contact a doctor if:  You have belly (abdominal) pain that: ? Gets worse. ? Stays in one area (localizes).  You have a rash.  You have a stiff neck.  You get angry or annoyed more easily than normal (irritability).  You are more sleepy than normal.  You have a harder time waking up than normal.  You feel: ? Weak. ? Dizzy. ? Very thirsty.  You have peed (urinated) only a small amount of very dark pee during 6-8 hours. Get help right away if:  You have symptoms of   very bad dehydration.  You cannot drink fluids without throwing up (vomiting).  Your symptoms get worse with treatment.  You have a fever.  You have a very bad headache.  You are throwing up or having watery poop (diarrhea) and it: ? Gets worse. ? Does not go away.  You have blood or something green (bile) in your throw-up.  You have blood in your poop (stool). This may cause poop to look black and tarry.  You have not peed in 6-8 hours.  You pass out (faint).  Your heart rate when you are sitting still is more than 100 beats a  minute.  You have trouble breathing. This information is not intended to replace advice given to you by your health care provider. Make sure you discuss any questions you have with your health care provider. Document Released: 05/26/2009 Document Revised: 02/17/2016 Document Reviewed: 09/23/2015 Elsevier Interactive Patient Education  2018 [employer]  

## 2017-08-02 NOTE — Assessment & Plan Note (Addendum)
04/30/2017: Left lumpectomy: IDC grade 3, 1.7 cm, DCIS, lymphovascular invasion present, margins negative, 0/1 lymph node negative, ER 0%, PR 0%, HER-2 negative ratio 1.37, Ki-67 40%, T1c N0 stage IB  Pathology counseling: I discussed the final pathology report of the patient provided a copy of this report. I discussed the margins as well as lymph node surgeries. We also discussed the final staging along with previously performed ER/PR and HER-2/neu testing.  Recommendation: 1. adjuvant chemotherapy with dose dense Adriamycin and Cytoxan 4 followed by Taxol weekly 12 ( cannot use steroids because patient is diabetic) 2. Followed by radiation --------------------------------------------------------------------------------------------------------------------------- Current treatment: Completed 4 cycles ofdose dense Adriamycin and Cytoxan, currently receiving Taxol  Today is week 2, however her creatinine is up to 1.9 today ? Related to dehydration from previous diarrhea.  I recommended that she call her endocrinologist and discuss him changing her metformin due to diarrhea and elevated creatinine today.  She will not receive chemotherapy.  We will give her one liter of fluids instead.  I discontinued meloxicam, ibuprofen, and Maxzide from her medication list.  Her blood pressure was low normal today.   We will see Ricka again next week for labs, f/u and hopefully cycle 2 of Taxol.

## 2017-08-08 NOTE — Assessment & Plan Note (Addendum)
04/30/2017: Left lumpectomy: IDC grade 3, 1.7 cm, DCIS, lymphovascular invasion present, margins negative, 0/1 lymph node negative, ER 0%, PR 0%, HER-2 negative ratio 1.37, Ki-67 40%, T1c N0 stage IB  Pathology counseling: I discussed the final pathology report of the patient provided a copy of this report. I discussed the margins as well as lymph node surgeries. We also discussed the final staging along with previously performed ER/PR and HER-2/neu testing.  Recommendation: 1. adjuvant chemotherapy with dose dense Adriamycin and Cytoxan 4 followed by Taxol weekly 12 ( cannot use steroids because patient is diabetic) 2. Followed by radiation --------------------------------------------------------------------------------------------------------------------------- Current treatment: Completed 4 cycles ofdose dense Adriamycin and Cytoxan,  Today is week 2 Taxol  Diarrhea and Dehydration: creatinine was up to 1.9  Given IV fluids instead.  Her kidney function is much improved.  She will proceed with chemo today. Inability to tolerate dexamethasone and IV Benadryl, she will not receive Dexamethasone pre chemo today, and the benadryl was changed to PO.    RTC weekly for chemo and q 2 weeks for follow up with Dr. Lindi Adie   She was seen with Dr. Lindi Adie today.

## 2017-08-09 ENCOUNTER — Ambulatory Visit (HOSPITAL_BASED_OUTPATIENT_CLINIC_OR_DEPARTMENT_OTHER): Payer: BC Managed Care – PPO

## 2017-08-09 ENCOUNTER — Ambulatory Visit: Payer: BC Managed Care – PPO

## 2017-08-09 ENCOUNTER — Telehealth: Payer: Self-pay | Admitting: Hematology and Oncology

## 2017-08-09 ENCOUNTER — Other Ambulatory Visit (HOSPITAL_BASED_OUTPATIENT_CLINIC_OR_DEPARTMENT_OTHER): Payer: BC Managed Care – PPO

## 2017-08-09 ENCOUNTER — Ambulatory Visit (HOSPITAL_BASED_OUTPATIENT_CLINIC_OR_DEPARTMENT_OTHER): Payer: BC Managed Care – PPO | Admitting: Hematology and Oncology

## 2017-08-09 ENCOUNTER — Encounter: Payer: Self-pay | Admitting: *Deleted

## 2017-08-09 ENCOUNTER — Other Ambulatory Visit: Payer: Self-pay

## 2017-08-09 DIAGNOSIS — C50412 Malignant neoplasm of upper-outer quadrant of left female breast: Secondary | ICD-10-CM

## 2017-08-09 DIAGNOSIS — Z95828 Presence of other vascular implants and grafts: Secondary | ICD-10-CM

## 2017-08-09 DIAGNOSIS — Z171 Estrogen receptor negative status [ER-]: Secondary | ICD-10-CM

## 2017-08-09 DIAGNOSIS — G620 Drug-induced polyneuropathy: Secondary | ICD-10-CM

## 2017-08-09 DIAGNOSIS — Z5111 Encounter for antineoplastic chemotherapy: Secondary | ICD-10-CM

## 2017-08-09 LAB — CBC WITH DIFFERENTIAL/PLATELET
BASO%: 0.3 % (ref 0.0–2.0)
Basophils Absolute: 0 10*3/uL (ref 0.0–0.1)
EOS%: 0.9 % (ref 0.0–7.0)
Eosinophils Absolute: 0 10*3/uL (ref 0.0–0.5)
HCT: 29.2 % — ABNORMAL LOW (ref 34.8–46.6)
HGB: 9.6 g/dL — ABNORMAL LOW (ref 11.6–15.9)
LYMPH%: 14 % (ref 14.0–49.7)
MCH: 29.1 pg (ref 25.1–34.0)
MCHC: 33 g/dL (ref 31.5–36.0)
MCV: 88.1 fL (ref 79.5–101.0)
MONO#: 0.9 10*3/uL (ref 0.1–0.9)
MONO%: 17.7 % — AB (ref 0.0–14.0)
NEUT%: 67.1 % (ref 38.4–76.8)
NEUTROS ABS: 3.4 10*3/uL (ref 1.5–6.5)
Platelets: 209 10*3/uL (ref 145–400)
RBC: 3.31 10*6/uL — AB (ref 3.70–5.45)
RDW: 16.6 % — ABNORMAL HIGH (ref 11.2–14.5)
WBC: 5 10*3/uL (ref 3.9–10.3)
lymph#: 0.7 10*3/uL — ABNORMAL LOW (ref 0.9–3.3)

## 2017-08-09 LAB — COMPREHENSIVE METABOLIC PANEL
ALBUMIN: 3 g/dL — AB (ref 3.5–5.0)
ALK PHOS: 65 U/L (ref 40–150)
ALT: 26 U/L (ref 0–55)
AST: 42 U/L — AB (ref 5–34)
Anion Gap: 9 mEq/L (ref 3–11)
BILIRUBIN TOTAL: 0.34 mg/dL (ref 0.20–1.20)
BUN: 15.2 mg/dL (ref 7.0–26.0)
CALCIUM: 8.2 mg/dL — AB (ref 8.4–10.4)
CO2: 23 mEq/L (ref 22–29)
CREATININE: 1.1 mg/dL (ref 0.6–1.1)
Chloride: 107 mEq/L (ref 98–109)
EGFR: >60 mL/min/{1.73_m2} (ref >60)
GLUCOSE: 237 mg/dL — AB (ref 70–140)
Potassium: 3.2 mEq/L — ABNORMAL LOW (ref 3.5–5.1)
SODIUM: 140 meq/L (ref 136–145)
TOTAL PROTEIN: 6.2 g/dL — AB (ref 6.4–8.3)

## 2017-08-09 MED ORDER — PALONOSETRON HCL INJECTION 0.25 MG/5ML
INTRAVENOUS | Status: AC
  Filled 2017-08-09: qty 5

## 2017-08-09 MED ORDER — PALONOSETRON HCL INJECTION 0.25 MG/5ML
0.2500 mg | Freq: Once | INTRAVENOUS | Status: AC
  Administered 2017-08-09: 0.25 mg via INTRAVENOUS

## 2017-08-09 MED ORDER — SODIUM CHLORIDE 0.9 % IV SOLN
80.0000 mg/m2 | Freq: Once | INTRAVENOUS | Status: AC
  Administered 2017-08-09: 180 mg via INTRAVENOUS
  Filled 2017-08-09: qty 30

## 2017-08-09 MED ORDER — FAMOTIDINE IN NACL 20-0.9 MG/50ML-% IV SOLN
INTRAVENOUS | Status: AC
  Filled 2017-08-09: qty 50

## 2017-08-09 MED ORDER — SODIUM CHLORIDE 0.9% FLUSH
10.0000 mL | INTRAVENOUS | Status: DC | PRN
  Administered 2017-08-09: 10 mL
  Filled 2017-08-09: qty 10

## 2017-08-09 MED ORDER — DIPHENHYDRAMINE HCL 25 MG PO CAPS
ORAL_CAPSULE | ORAL | Status: AC
  Filled 2017-08-09: qty 2

## 2017-08-09 MED ORDER — SODIUM CHLORIDE 0.9% FLUSH
10.0000 mL | INTRAVENOUS | Status: DC | PRN
  Administered 2017-08-09: 10 mL via INTRAVENOUS
  Filled 2017-08-09: qty 10

## 2017-08-09 MED ORDER — POTASSIUM CHLORIDE CRYS ER 20 MEQ PO TBCR: 20.0000 meq | EXTENDED_RELEASE_TABLET | Freq: Every day | ORAL | 0 refills | Status: DC

## 2017-08-09 MED ORDER — DIPHENHYDRAMINE HCL 25 MG PO CAPS
50.0000 mg | ORAL_CAPSULE | Freq: Once | ORAL | Status: AC
  Administered 2017-08-09: 50 mg via ORAL

## 2017-08-09 MED ORDER — SODIUM CHLORIDE 0.9 % IV SOLN
Freq: Once | INTRAVENOUS | Status: AC
  Administered 2017-08-09: 11:00:00 via INTRAVENOUS

## 2017-08-09 MED ORDER — HEPARIN SOD (PORK) LOCK FLUSH 100 UNIT/ML IV SOLN
500.0000 [IU] | Freq: Once | INTRAVENOUS | Status: AC | PRN
  Administered 2017-08-09: 500 [IU]
  Filled 2017-08-09: qty 5

## 2017-08-09 MED ORDER — FAMOTIDINE IN NACL 20-0.9 MG/50ML-% IV SOLN
20.0000 mg | Freq: Once | INTRAVENOUS | Status: AC
Start: 2017-08-09 — End: 2017-08-09
  Administered 2017-08-09: 20 mg via INTRAVENOUS

## 2017-08-09 NOTE — Patient Instructions (Signed)
[facility] Discharge Instructions for Patients Receiving Chemotherapy  Today you received the following chemotherapy agents paclitaxel (Taxol)  To help prevent nausea and vomiting after your treatment, we encourage you to take your nausea medication as directed by your doctor.   If you develop nausea and vomiting that is not controlled by your nausea medication, call the clinic.   BELOW ARE SYMPTOMS THAT SHOULD BE REPORTED IMMEDIATELY:  *FEVER GREATER THAN 100.5 F  *CHILLS WITH OR WITHOUT FEVER  NAUSEA AND VOMITING THAT IS NOT CONTROLLED WITH YOUR NAUSEA MEDICATION  *UNUSUAL SHORTNESS OF BREATH  *UNUSUAL BRUISING OR BLEEDING  TENDERNESS IN MOUTH AND THROAT WITH OR WITHOUT PRESENCE OF ULCERS  *URINARY PROBLEMS  *BOWEL PROBLEMS  UNUSUAL RASH Items with * indicate a potential emergency and should be followed up as soon as possible.  Feel free to call the clinic should you have any questions or concerns. The clinic phone number is [phone].  Please show the CHEMO ALERT CARD at check-in to the Emergency Department and triage nurse.   

## 2017-08-09 NOTE — Telephone Encounter (Signed)
Ok to treat with todays labs per Dr. Gudena.  Izacc Demeyer N [place] Shannahan RN  

## 2017-08-09 NOTE — Progress Notes (Addendum)
Patient Care Team: [name], MD as PCP - General (Internal Medicine)  DIAGNOSIS:  Encounter Diagnosis  Name Primary?  . Malignant neoplasm of upper-outer quadrant of left breast in female, estrogen receptor negative ([place])     SUMMARY OF ONCOLOGIC HISTORY:   Malignant neoplasm of upper-outer quadrant of left breast in female, estrogen receptor negative ([place])   04/05/2017 Initial Diagnosis    Left breast asymmetry by ultrasound measured 1.3 cm at 2:30 position 10 cm from nipple, no axillary lymph nodes; biopsy IDC grade 2, ER 0%, PR 0%, HER-2 negative ratio 1.37, Ki-67 40%, T1c N0 stage IB AJCC 8       04/30/2017 Surgery    Left lumpectomy: IDC grade 3, 1.7 cm, DCIS, lymphovascular invasion present, margins negative, 0/1 lymph node negative, ER 0%, PR 0%, HER-2 negative ratio 1.37, Ki-67 40%, T1c N0 stage IB      05/22/2017 Genetic Testing    Patient had genetic testing due to a personal history of triple negative breast cancer.  The Common Hereditary Cancer Panel was ordered. The Hereditary Gene Panel offered by [facility] includes sequencing and/or deletion duplication testing of the following 46 genes: APC, ATM, AXIN2, BARD1, BMPR1A, BRCA1, BRCA2, BRIP1, CDH1, CDKN2A (p14ARF), CDKN2A (p16INK4a), CHEK2, CTNNA1, DICER1, EPCAM (Deletion/duplication testing only), GREM1 (promoter region deletion/duplication testing only), KIT, MEN1, MLH1, MSH2, MSH3, MSH6, MUTYH, NBN, NF1, NHTL1, PALB2, PDGFRA, PMS2, POLD1, POLE, PTEN, RAD50, RAD51C, RAD51D, SDHB, SDHC, SDHD, SMAD4, SMARCA4. STK11, TP53, TSC1, TSC2, and VHL.  The following genes were evaluated for sequence changes only: SDHA and HOXB13 c.251G>A variant only.    Results: No pathogenic mutations identified.  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  The date of this test report is 05/22/2017.       05/24/2017 -  Chemotherapy    Dose dense Adriamycin and Cytoxan 4 followed by Taxol weekly 12        CHIEF COMPLIANT:  Taxol cycle 2  INTERVAL HISTORY: Audrey Peters is a 61 year old with above-mentioned history of left breast cancer treated with lumpectomy and is here adjuvant chemotherapy and today is cycle 2 of Taxol.  She tolerated cycle 1 of Taxol, but did have some mild diarrhea following.  She also had difficulty with the Dexamethasone and IV Diphenhydramine.  We held her chemo last week due to elevated kidney function, and she received one liter of iv fluids.  Her Metformin and HCTZ was discontinued.  Her blood pressure is normal today and her endocrinologist is aware of the Metformin discontinuation.  She has mild baseline peripheral neuroapthy.    REVIEW OF SYSTEMS:   Constitutional: Denies fevers, chills or abnormal weight loss Eyes: Denies blurriness of vision Ears, nose, mouth, throat, and face: Denies mucositis or sore throat Respiratory: Denies cough, dyspnea or wheezes Cardiovascular: Denies palpitation, chest discomfort Gastrointestinal:  Denies nausea, heartburn or change in bowel habits Skin: Denies abnormal skin rashes Lymphatics: Denies new lymphadenopathy or easy bruising Neurological:Denies numbness, tingling or new weaknesses Behavioral/Psych: Mood is stable, no new changes  Extremities: No lower extremity edema Breast:  denies any pain or lumps or nodules in either breasts All other systems were reviewed with the patient and are negative.  I have reviewed the past medical history, past surgical history, social history and family history with the patient and they are unchanged from previous note.  ALLERGIES:  is allergic to invokana [canagliflozin]; sulfa antibiotics; and ciprofloxacin.  MEDICATIONS:  Current Outpatient Medications  Medication Sig Dispense Refill  .  acetaminophen (TYLENOL 8 HOUR ARTHRITIS PAIN) 650 MG CR tablet Take 650 mg by mouth every 8 (eight) hours as needed for pain.    . clotrimazole (LOTRIMIN) 1 % cream Apply 1 application topically daily as needed (for  irritated/itchy skin.).     Marland Kitchen Dulaglutide (TRULICITY) 1.5 MV/3.6PQ SOPN Inject 1.5 mg into the skin every Tuesday.    . estradiol (ESTRACE) 0.1 MG/GM vaginal cream Place 1 Applicatorful vaginally 2 (two) times a week.    . insulin regular human CONCENTRATED (HUMULIN R U-500 KWIKPEN) 500 UNIT/ML kwikpen Inject 50-120 Units into the skin 3 (three) times daily with meals. 120 units in the morning, 50 units at lunch and 50 units at supper.    . lidocaine-prilocaine (EMLA) cream Apply to affected area once 30 g 3  . losartan (COZAAR) 100 MG tablet Take 100 mg by mouth daily.    . magic mouthwash SOLN Take 5 mLs 4 (four) times daily as needed by mouth for mouth pain. 240 mL 0  . metFORMIN (GLUCOPHAGE) 1000 MG tablet Take 1,000 mg by mouth daily.    . ondansetron (ZOFRAN) 8 MG tablet Take 1 tablet (8 mg total) by mouth 2 (two) times daily as needed (may take at night to prevent AM nausea/vomiting). 30 tablet 1  . potassium chloride SA (K-DUR,KLOR-CON) 20 MEQ tablet Take 1 tablet (20 mEq total) by mouth daily. 30 tablet 0  . prochlorperazine (COMPAZINE) 10 MG tablet Take 1 tablet (10 mg total) by mouth every 6 (six) hours as needed (Nausea or vomiting). 30 tablet 1  . traMADol (ULTRAM) 50 MG tablet Take 1 tablet (50 mg total) by mouth every 6 (six) hours as needed. 30 tablet 0   No current facility-administered medications for this visit.    Facility-Administered Medications Ordered in Other Visits  Medication Dose Route Frequency Provider Last Rate Last Dose  . heparin lock flush 100 unit/mL  500 Units Intracatheter Once PRN [name], MD      . PACLitaxel (TAXOL) 180 mg in sodium chloride 0.9 % 250 mL chemo infusion (</= 82m/m2)  80 mg/m2 (Treatment Plan Recorded) Intravenous Once GNicholas Lose MD 280 mL/hr at 08/09/17 1211 180 mg at 08/09/17 1211  . sodium chloride flush (NS) 0.9 % injection 10 mL  10 mL Intracatheter PRN GNicholas Lose MD        PHYSICAL EXAMINATION: ECOG PERFORMANCE STATUS:  1 - Symptomatic but completely ambulatory  Vitals:   08/09/17 0943  BP: 136/78  Pulse: 86  Resp: 18  Temp: 98.3 F (36.8 C)  SpO2: 100%   Filed Weights   08/09/17 0943  Weight: 227 lb 12.8 oz (103.3 kg)    GENERAL:alert, no distress and comfortable SKIN: skin color, texture, turgor are normal, no rashes or significant lesions EYES: normal, Conjunctiva are pink and non-injected, sclera clear OROPHARYNX:no exudate, no erythema and lips, buccal mucosa, and tongue normal  NECK: supple, thyroid normal size, non-tender, without nodularity LYMPH:  no palpable lymphadenopathy in the cervical, axillary or inguinal LUNGS: clear to auscultation and percussion with normal breathing effort HEART: regular rate & rhythm and no murmurs and no lower extremity edema ABDOMEN:abdomen soft, non-tender and normal bowel sounds MUSCULOSKELETAL:no cyanosis of digits and no clubbing  NEURO: alert & oriented x 3 with fluent speech, no focal motor/sensory deficits EXTREMITIES: No lower extremity edema   LABORATORY DATA:  I have reviewed the data as listed   Chemistry      Component Value Date/Time   NA 140 08/09/2017  0838   K 3.2 (L) 08/09/2017 0838   CL 105 04/25/2017 0946   CO2 23 08/09/2017 0838   BUN 15.2 08/09/2017 0838   CREATININE 1.1 08/09/2017 0838      Component Value Date/Time   CALCIUM 8.2 (L) 08/09/2017 0838   ALKPHOS 65 08/09/2017 0838   AST 42 (H) 08/09/2017 0838   ALT 26 08/09/2017 0838   BILITOT 0.34 08/09/2017 0838       Lab Results  Component Value Date   WBC 5.0 08/09/2017   HGB 9.6 (L) 08/09/2017   HCT 29.2 (L) 08/09/2017   MCV 88.1 08/09/2017   PLT 209 08/09/2017   NEUTROABS 3.4 08/09/2017    ASSESSMENT & PLAN:  Malignant neoplasm of upper-outer quadrant of left breast in female, estrogen receptor negative ([place]) 04/30/2017: Left lumpectomy: IDC grade 3, 1.7 cm, DCIS, lymphovascular invasion present, margins negative, 0/1 lymph node negative, ER 0%, PR 0%,  HER-2 negative ratio 1.37, Ki-67 40%, T1c N0 stage IB  Pathology counseling: I discussed the final pathology report of the patient provided a copy of this report. I discussed the margins as well as lymph node surgeries. We also discussed the final staging along with previously performed ER/PR and HER-2/neu testing.  Recommendation: 1. adjuvant chemotherapy with dose dense Adriamycin and Cytoxan 4 followed by Taxol weekly 12 ( cannot use steroids because patient is diabetic) 2. Followed by radiation --------------------------------------------------------------------------------------------------------------------------- Current treatment: Completed 4 cycles ofdose dense Adriamycin and Cytoxan,  Today is week 2 Taxol  Diarrhea and Dehydration: creatinine was up to 1.9  Given IV fluids instead.  Her kidney function is much improved.  She will proceed with chemo today. Inability to tolerate dexamethasone and IV Benadryl, she will not receive Dexamethasone pre chemo today, and the benadryl was changed to PO.    RTC weekly for chemo and q 2 weeks for follow up with me.   I spent 25 minutes talking to the patient of which more than half was spent in counseling and coordination of care.   The patient has a good understanding of the overall plan. she agrees with it. she will call with any problems that may develop before the next visit here.   I saw the patient in conjunction with our nurse practitioner. Based upon normalization of kidney function, we can continue with her current chemotherapy. She will need to be closely watched and monitored. Instructed the patient to drink adequate amount of water. Monitoring closely for chemo toxicities.  Harriette Ohara, MD 08/09/17

## 2017-08-09 NOTE — Telephone Encounter (Signed)
No 12/28 los.  

## 2017-08-09 NOTE — Progress Notes (Signed)
Per Dr. Lindi Adie okay to tx with K 3.2 and glucose 237. Order placed for potassium at home.

## 2017-08-16 ENCOUNTER — Ambulatory Visit (HOSPITAL_BASED_OUTPATIENT_CLINIC_OR_DEPARTMENT_OTHER): Payer: BC Managed Care – PPO

## 2017-08-16 ENCOUNTER — Other Ambulatory Visit (HOSPITAL_BASED_OUTPATIENT_CLINIC_OR_DEPARTMENT_OTHER): Payer: BC Managed Care – PPO

## 2017-08-16 VITALS — BP 125/79 | HR 92 | Temp 98.4°F | Resp 16 | Wt 221.8 lb

## 2017-08-16 DIAGNOSIS — C50412 Malignant neoplasm of upper-outer quadrant of left female breast: Secondary | ICD-10-CM

## 2017-08-16 DIAGNOSIS — Z171 Estrogen receptor negative status [ER-]: Principal | ICD-10-CM

## 2017-08-16 DIAGNOSIS — Z5111 Encounter for antineoplastic chemotherapy: Secondary | ICD-10-CM | POA: Diagnosis not present

## 2017-08-16 LAB — COMPREHENSIVE METABOLIC PANEL
ALK PHOS: 62 U/L (ref 40–150)
ALT: 28 U/L (ref 0–55)
ANION GAP: 8 meq/L (ref 3–11)
AST: 48 U/L — ABNORMAL HIGH (ref 5–34)
Albumin: 3 g/dL — ABNORMAL LOW (ref 3.5–5.0)
BILIRUBIN TOTAL: 0.58 mg/dL (ref 0.20–1.20)
BUN: 9 mg/dL (ref 7.0–26.0)
CO2: 23 meq/L (ref 22–29)
Calcium: 7.5 mg/dL — ABNORMAL LOW (ref 8.4–10.4)
Chloride: 107 mEq/L (ref 98–109)
Creatinine: 0.8 mg/dL (ref 0.6–1.1)
Glucose: 200 mg/dl — ABNORMAL HIGH (ref 70–140)
Potassium: 3.2 mEq/L — ABNORMAL LOW (ref 3.5–5.1)
Sodium: 138 mEq/L (ref 136–145)
TOTAL PROTEIN: 6.5 g/dL (ref 6.4–8.3)

## 2017-08-16 LAB — CBC WITH DIFFERENTIAL/PLATELET
BASO%: 0.2 % (ref 0.0–2.0)
Basophils Absolute: 0 10*3/uL (ref 0.0–0.1)
EOS%: 0.7 % (ref 0.0–7.0)
Eosinophils Absolute: 0 10*3/uL (ref 0.0–0.5)
HEMATOCRIT: 27.5 % — AB (ref 34.8–46.6)
HGB: 9 g/dL — ABNORMAL LOW (ref 11.6–15.9)
LYMPH#: 0.9 10*3/uL (ref 0.9–3.3)
LYMPH%: 15.5 % (ref 14.0–49.7)
MCH: 29 pg (ref 25.1–34.0)
MCHC: 32.7 g/dL (ref 31.5–36.0)
MCV: 88.7 fL (ref 79.5–101.0)
MONO#: 0.4 10*3/uL (ref 0.1–0.9)
MONO%: 6.3 % (ref 0.0–14.0)
NEUT%: 77.3 % — ABNORMAL HIGH (ref 38.4–76.8)
NEUTROS ABS: 4.7 10*3/uL (ref 1.5–6.5)
Platelets: 183 10*3/uL (ref 145–400)
RBC: 3.1 10*6/uL — ABNORMAL LOW (ref 3.70–5.45)
RDW: 15.4 % — AB (ref 11.2–14.5)
WBC: 6.1 10*3/uL (ref 3.9–10.3)

## 2017-08-16 MED ORDER — PALONOSETRON HCL INJECTION 0.25 MG/5ML
0.2500 mg | Freq: Once | INTRAVENOUS | Status: AC
  Administered 2017-08-16: 0.25 mg via INTRAVENOUS

## 2017-08-16 MED ORDER — PALONOSETRON HCL INJECTION 0.25 MG/5ML
INTRAVENOUS | Status: AC
  Filled 2017-08-16: qty 5

## 2017-08-16 MED ORDER — FAMOTIDINE IN NACL 20-0.9 MG/50ML-% IV SOLN
INTRAVENOUS | Status: AC
  Filled 2017-08-16: qty 50

## 2017-08-16 MED ORDER — SODIUM CHLORIDE 0.9 % IV SOLN
80.0000 mg/m2 | Freq: Once | INTRAVENOUS | Status: AC
  Administered 2017-08-16: 180 mg via INTRAVENOUS
  Filled 2017-08-16: qty 30

## 2017-08-16 MED ORDER — FAMOTIDINE IN NACL 20-0.9 MG/50ML-% IV SOLN
20.0000 mg | Freq: Once | INTRAVENOUS | Status: AC
  Administered 2017-08-16: 20 mg via INTRAVENOUS

## 2017-08-16 MED ORDER — SODIUM CHLORIDE 0.9% FLUSH
10.0000 mL | INTRAVENOUS | Status: DC | PRN
  Administered 2017-08-16: 10 mL
  Filled 2017-08-16: qty 10

## 2017-08-16 MED ORDER — DIPHENHYDRAMINE HCL 25 MG PO CAPS
50.0000 mg | ORAL_CAPSULE | Freq: Once | ORAL | Status: AC
  Administered 2017-08-16: 50 mg via ORAL

## 2017-08-16 MED ORDER — DIPHENHYDRAMINE HCL 25 MG PO CAPS
ORAL_CAPSULE | ORAL | Status: AC
  Filled 2017-08-16: qty 2

## 2017-08-16 MED ORDER — HEPARIN SOD (PORK) LOCK FLUSH 100 UNIT/ML IV SOLN
500.0000 [IU] | Freq: Once | INTRAVENOUS | Status: AC | PRN
  Administered 2017-08-16: 500 [IU]
  Filled 2017-08-16: qty 5

## 2017-08-16 MED ORDER — SODIUM CHLORIDE 0.9 % IV SOLN
Freq: Once | INTRAVENOUS | Status: AC
  Administered 2017-08-16: 11:00:00 via INTRAVENOUS

## 2017-08-16 NOTE — Patient Instructions (Signed)
[facility] Discharge Instructions for Patients Receiving Chemotherapy  Today you received the following chemotherapy agents :  Taxol.  To help prevent nausea and vomiting after your treatment, we encourage you to take your nausea medication as prescribed.   If you develop nausea and vomiting that is not controlled by your nausea medication, call the clinic.   BELOW ARE SYMPTOMS THAT SHOULD BE REPORTED IMMEDIATELY:  *FEVER GREATER THAN 100.5 F  *CHILLS WITH OR WITHOUT FEVER  NAUSEA AND VOMITING THAT IS NOT CONTROLLED WITH YOUR NAUSEA MEDICATION  *UNUSUAL SHORTNESS OF BREATH  *UNUSUAL BRUISING OR BLEEDING  TENDERNESS IN MOUTH AND THROAT WITH OR WITHOUT PRESENCE OF ULCERS  *URINARY PROBLEMS  *BOWEL PROBLEMS  UNUSUAL RASH Items with * indicate a potential emergency and should be followed up as soon as possible.  Feel free to call the clinic should you have any questions or concerns. The clinic phone number is [phone].  Please show the CHEMO ALERT CARD at check-in to the Emergency Department and triage nurse.   

## 2017-08-22 NOTE — Assessment & Plan Note (Signed)
04/30/2017: Left lumpectomy: IDC grade 3, 1.7 cm, DCIS, lymphovascular invasion present, margins negative, 0/1 lymph node negative, ER 0%, PR 0%, HER-2 negative ratio 1.37, Ki-67 40%, T1c N0 stage IB  Pathology counseling: I discussed the final pathology report of the patient provided a copy of this report. I discussed the margins as well as lymph node surgeries. We also discussed the final staging along with previously performed ER/PR and HER-2/neu testing.  Recommendation: 1. adjuvant chemotherapy with dose dense Adriamycin and Cytoxan 4 followed by Taxol weekly 12 ( cannot use steroids because patient is diabetic) 2. Followed by radiation --------------------------------------------------------------------------------------------------------------------------- Current treatment:Completed 4 cyclesofdose dense Adriamycin and Cytoxan,currently receiving Taxol Toxicities: 1. Diarrhea  Today is week 4

## 2017-08-23 ENCOUNTER — Inpatient Hospital Stay: Payer: BC Managed Care – PPO

## 2017-08-23 ENCOUNTER — Inpatient Hospital Stay (HOSPITAL_BASED_OUTPATIENT_CLINIC_OR_DEPARTMENT_OTHER): Payer: BC Managed Care – PPO | Admitting: Hematology and Oncology

## 2017-08-23 ENCOUNTER — Telehealth: Payer: Self-pay | Admitting: *Deleted

## 2017-08-23 ENCOUNTER — Inpatient Hospital Stay: Payer: BC Managed Care – PPO | Attending: Hematology and Oncology

## 2017-08-23 DIAGNOSIS — Z95828 Presence of other vascular implants and grafts: Secondary | ICD-10-CM

## 2017-08-23 DIAGNOSIS — R530 Neoplastic (malignant) related fatigue: Secondary | ICD-10-CM | POA: Diagnosis not present

## 2017-08-23 DIAGNOSIS — R197 Diarrhea, unspecified: Secondary | ICD-10-CM

## 2017-08-23 DIAGNOSIS — R209 Unspecified disturbances of skin sensation: Secondary | ICD-10-CM

## 2017-08-23 DIAGNOSIS — E876 Hypokalemia: Secondary | ICD-10-CM

## 2017-08-23 DIAGNOSIS — C50412 Malignant neoplasm of upper-outer quadrant of left female breast: Secondary | ICD-10-CM | POA: Diagnosis not present

## 2017-08-23 DIAGNOSIS — T451X5A Adverse effect of antineoplastic and immunosuppressive drugs, initial encounter: Secondary | ICD-10-CM

## 2017-08-23 DIAGNOSIS — Z7984 Long term (current) use of oral hypoglycemic drugs: Secondary | ICD-10-CM | POA: Diagnosis not present

## 2017-08-23 DIAGNOSIS — N939 Abnormal uterine and vaginal bleeding, unspecified: Secondary | ICD-10-CM | POA: Diagnosis not present

## 2017-08-23 DIAGNOSIS — G620 Drug-induced polyneuropathy: Secondary | ICD-10-CM | POA: Diagnosis not present

## 2017-08-23 DIAGNOSIS — E119 Type 2 diabetes mellitus without complications: Secondary | ICD-10-CM | POA: Diagnosis not present

## 2017-08-23 DIAGNOSIS — Z171 Estrogen receptor negative status [ER-]: Secondary | ICD-10-CM | POA: Insufficient documentation

## 2017-08-23 DIAGNOSIS — Z5111 Encounter for antineoplastic chemotherapy: Secondary | ICD-10-CM | POA: Insufficient documentation

## 2017-08-23 DIAGNOSIS — D6481 Anemia due to antineoplastic chemotherapy: Secondary | ICD-10-CM | POA: Insufficient documentation

## 2017-08-23 LAB — CBC WITH DIFFERENTIAL/PLATELET
BASOS ABS: 0 10*3/uL (ref 0.0–0.1)
BASOS PCT: 1 %
Eosinophils Absolute: 0 10*3/uL (ref 0.0–0.5)
Eosinophils Relative: 1 %
HCT: 26.2 % — ABNORMAL LOW (ref 34.8–46.6)
Hemoglobin: 8.7 g/dL — ABNORMAL LOW (ref 11.6–15.9)
Lymphocytes Relative: 25 %
Lymphs Abs: 0.7 10*3/uL — ABNORMAL LOW (ref 0.9–3.3)
MCH: 29.1 pg (ref 25.1–34.0)
MCHC: 33.1 g/dL (ref 31.5–36.0)
MCV: 87.9 fL (ref 79.5–101.0)
MONO ABS: 0.3 10*3/uL (ref 0.1–0.9)
MONOS PCT: 10 %
NEUTROS ABS: 1.7 10*3/uL (ref 1.5–6.5)
NEUTROS PCT: 63 %
PLATELETS: 170 10*3/uL (ref 145–400)
RBC: 2.98 MIL/uL — ABNORMAL LOW (ref 3.70–5.45)
RDW: 16 % (ref 11.2–16.1)
WBC: 2.7 10*3/uL — ABNORMAL LOW (ref 3.9–10.3)

## 2017-08-23 LAB — COMPREHENSIVE METABOLIC PANEL
ALT: 36 U/L (ref 0–55)
ANION GAP: 10 (ref 3–11)
AST: 64 U/L — ABNORMAL HIGH (ref 5–34)
Albumin: 3.1 g/dL — ABNORMAL LOW (ref 3.5–5.0)
Alkaline Phosphatase: 66 U/L (ref 40–150)
BUN: 10 mg/dL (ref 7–26)
CHLORIDE: 106 mmol/L (ref 98–109)
CO2: 23 mmol/L (ref 22–29)
CREATININE: 0.85 mg/dL (ref 0.60–1.10)
Calcium: 8.6 mg/dL (ref 8.4–10.4)
GFR calc non Af Amer: >60 mL/min (ref >60)
Glucose, Bld: 247 mg/dL — ABNORMAL HIGH (ref 70–140)
Potassium: 3.2 mmol/L — ABNORMAL LOW (ref 3.3–4.7)
SODIUM: 139 mmol/L (ref 136–145)
Total Bilirubin: 0.7 mg/dL (ref 0.2–1.2)
Total Protein: 6.7 g/dL (ref 6.4–8.3)

## 2017-08-23 MED ORDER — DIPHENHYDRAMINE HCL 25 MG PO CAPS
50.0000 mg | ORAL_CAPSULE | Freq: Once | ORAL | Status: AC
  Administered 2017-08-23: 50 mg via ORAL

## 2017-08-23 MED ORDER — PACLITAXEL CHEMO INJECTION 300 MG/50ML
60.0000 mg/m2 | Freq: Once | INTRAVENOUS | Status: AC
  Administered 2017-08-23: 132 mg via INTRAVENOUS
  Filled 2017-08-23: qty 22

## 2017-08-23 MED ORDER — SODIUM CHLORIDE 0.9% FLUSH
10.0000 mL | INTRAVENOUS | Status: DC | PRN
  Administered 2017-08-23: 10 mL
  Filled 2017-08-23: qty 10

## 2017-08-23 MED ORDER — PALONOSETRON HCL INJECTION 0.25 MG/5ML
INTRAVENOUS | Status: AC
  Filled 2017-08-23: qty 5

## 2017-08-23 MED ORDER — DIPHENHYDRAMINE HCL 50 MG/ML IJ SOLN
INTRAMUSCULAR | Status: AC
  Filled 2017-08-23: qty 1

## 2017-08-23 MED ORDER — FAMOTIDINE IN NACL 20-0.9 MG/50ML-% IV SOLN
INTRAVENOUS | Status: AC
  Filled 2017-08-23: qty 50

## 2017-08-23 MED ORDER — DIPHENHYDRAMINE HCL 25 MG PO CAPS
ORAL_CAPSULE | ORAL | Status: AC
  Filled 2017-08-23: qty 2

## 2017-08-23 MED ORDER — PALONOSETRON HCL INJECTION 0.25 MG/5ML
0.2500 mg | Freq: Once | INTRAVENOUS | Status: AC
  Administered 2017-08-23: 0.25 mg via INTRAVENOUS

## 2017-08-23 MED ORDER — SODIUM CHLORIDE 0.9 % IV SOLN
Freq: Once | INTRAVENOUS | Status: AC
  Administered 2017-08-23: 11:00:00 via INTRAVENOUS

## 2017-08-23 MED ORDER — SODIUM CHLORIDE 0.9% FLUSH
10.0000 mL | INTRAVENOUS | Status: DC | PRN
  Administered 2017-08-23: 10 mL via INTRAVENOUS
  Filled 2017-08-23: qty 10

## 2017-08-23 MED ORDER — FAMOTIDINE IN NACL 20-0.9 MG/50ML-% IV SOLN
20.0000 mg | Freq: Once | INTRAVENOUS | Status: AC
  Administered 2017-08-23: 20 mg via INTRAVENOUS

## 2017-08-23 MED ORDER — HEPARIN SOD (PORK) LOCK FLUSH 100 UNIT/ML IV SOLN
500.0000 [IU] | Freq: Once | INTRAVENOUS | Status: AC | PRN
  Administered 2017-08-23: 500 [IU]
  Filled 2017-08-23: qty 5

## 2017-08-23 NOTE — Progress Notes (Signed)
Dr. Lindi Adie okay to tx with Hgb 8.7 and K 3.2. Pt questioned whether or not to restart BP medicine. BP today 158/90. MD would like for pt to restart BP medicine. Pt made aware.

## 2017-08-23 NOTE — Patient Instructions (Signed)
[facility] Discharge Instructions for Patients Receiving Chemotherapy  Today you received the following chemotherapy agents: paclitaxel (Taxol).  To help prevent nausea and vomiting after your treatment, we encourage you to take your nausea medication as prescribed.  If you develop nausea and vomiting that is not controlled by your nausea medication, call the clinic.   BELOW ARE SYMPTOMS THAT SHOULD BE REPORTED IMMEDIATELY:  *FEVER GREATER THAN 100.5 F  *CHILLS WITH OR WITHOUT FEVER  NAUSEA AND VOMITING THAT IS NOT CONTROLLED WITH YOUR NAUSEA MEDICATION  *UNUSUAL SHORTNESS OF BREATH  *UNUSUAL BRUISING OR BLEEDING  TENDERNESS IN MOUTH AND THROAT WITH OR WITHOUT PRESENCE OF ULCERS  *URINARY PROBLEMS  *BOWEL PROBLEMS  UNUSUAL RASH Items with * indicate a potential emergency and should be followed up as soon as possible.  Feel free to call the clinic should you have any questions or concerns. The clinic phone number is [phone].  Please show the CHEMO ALERT CARD at check-in to the Emergency Department and triage nurse.   

## 2017-08-23 NOTE — Progress Notes (Signed)
Patient Care Team: [name], MD as PCP - General (Internal Medicine)  DIAGNOSIS:  Encounter Diagnosis  Name Primary?  . Malignant neoplasm of upper-outer quadrant of left breast in female, estrogen receptor negative ([place])     SUMMARY OF ONCOLOGIC HISTORY:   Malignant neoplasm of upper-outer quadrant of left breast in female, estrogen receptor negative ([place])   04/05/2017 Initial Diagnosis    Left breast asymmetry by ultrasound measured 1.3 cm at 2:30 position 10 cm from nipple, no axillary lymph nodes; biopsy IDC grade 2, ER 0%, PR 0%, HER-2 negative ratio 1.37, Ki-67 40%, T1c N0 stage IB AJCC 8       04/30/2017 Surgery    Left lumpectomy: IDC grade 3, 1.7 cm, DCIS, lymphovascular invasion present, margins negative, 0/1 lymph node negative, ER 0%, PR 0%, HER-2 negative ratio 1.37, Ki-67 40%, T1c N0 stage IB      05/22/2017 Genetic Testing    Patient had genetic testing due to a personal history of triple negative breast cancer.  The Common Hereditary Cancer Panel was ordered. The Hereditary Gene Panel offered by [facility] includes sequencing and/or deletion duplication testing of the following 46 genes: APC, ATM, AXIN2, BARD1, BMPR1A, BRCA1, BRCA2, BRIP1, CDH1, CDKN2A (p14ARF), CDKN2A (p16INK4a), CHEK2, CTNNA1, DICER1, EPCAM (Deletion/duplication testing only), GREM1 (promoter region deletion/duplication testing only), KIT, MEN1, MLH1, MSH2, MSH3, MSH6, MUTYH, NBN, NF1, NHTL1, PALB2, PDGFRA, PMS2, POLD1, POLE, PTEN, RAD50, RAD51C, RAD51D, SDHB, SDHC, SDHD, SMAD4, SMARCA4. STK11, TP53, TSC1, TSC2, and VHL.  The following genes were evaluated for sequence changes only: SDHA and HOXB13 c.251G>A variant only.    Results: No pathogenic mutations identified.  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  The date of this test report is 05/22/2017.       05/24/2017 -  Chemotherapy    Dose dense Adriamycin and Cytoxan 4 followed by Taxol weekly 12        CHIEF COMPLIANT:  Cycle 4 Taxol  INTERVAL HISTORY: Audrey Peters is a 62-year-old with above-mentioned history of left breast cancer currently on adjuvant chemotherapy today cycle 4 of Taxol.  She complains of fatigue as well as diarrhea after chemotherapy.  She does take Imodium which appears to be helping.  She has had for some loose stools per day.  Because of this she has been hypokalemic and is been taking potassium supplementation.  She does have chronic fatigue but denies any nausea or vomiting.  She does have numbness of the tips of her fingers.  There is no neuropathy in the toes.  REVIEW OF SYSTEMS:   Constitutional: Denies fevers, chills or abnormal weight loss Eyes: Denies blurriness of vision Ears, nose, mouth, throat, and face: Denies mucositis or sore throat Respiratory: Denies cough, dyspnea or wheezes Cardiovascular: Denies palpitation, chest discomfort Gastrointestinal:  Denies nausea, heartburn or change in bowel habits Skin: Denies abnormal skin rashes Lymphatics: Denies new lymphadenopathy or easy bruising Neurological: Numbness of the tips of the fingers Behavioral/Psych: Mood is stable, no new changes  Extremities: No lower extremity edema 4All other systems were reviewed with the patient and are negative.  I have reviewed the past medical history, past surgical history, social history and family history with the patient and they are unchanged from previous note.  ALLERGIES:  is allergic to invokana [canagliflozin]; sulfa antibiotics; and ciprofloxacin.  MEDICATIONS:  Current Outpatient Medications  Medication Sig Dispense Refill  . acetaminophen (TYLENOL 8 HOUR ARTHRITIS PAIN) 650 MG CR tablet Take 650 mg by mouth every 8 (  eight) hours as needed for pain.    . clotrimazole (LOTRIMIN) 1 % cream Apply 1 application topically daily as needed (for irritated/itchy skin.).     Marland Kitchen Dulaglutide (TRULICITY) 1.5 AC/1.6SA SOPN Inject 1.5 mg into the skin every Tuesday.    . estradiol (ESTRACE)  0.1 MG/GM vaginal cream Place 1 Applicatorful vaginally 2 (two) times a week.    . insulin regular human CONCENTRATED (HUMULIN R U-500 KWIKPEN) 500 UNIT/ML kwikpen Inject 50-120 Units into the skin 3 (three) times daily with meals. 120 units in the morning, 50 units at lunch and 50 units at supper.    . lidocaine-prilocaine (EMLA) cream Apply to affected area once 30 g 3  . losartan (COZAAR) 100 MG tablet Take 100 mg by mouth daily.    . magic mouthwash SOLN Take 5 mLs 4 (four) times daily as needed by mouth for mouth pain. 240 mL 0  . metFORMIN (GLUCOPHAGE) 1000 MG tablet Take 1,000 mg by mouth daily.    . ondansetron (ZOFRAN) 8 MG tablet Take 1 tablet (8 mg total) by mouth 2 (two) times daily as needed (may take at night to prevent AM nausea/vomiting). 30 tablet 1  . potassium chloride SA (K-DUR,KLOR-CON) 20 MEQ tablet Take 1 tablet (20 mEq total) by mouth daily. 30 tablet 0  . prochlorperazine (COMPAZINE) 10 MG tablet Take 1 tablet (10 mg total) by mouth every 6 (six) hours as needed (Nausea or vomiting). 30 tablet 1  . traMADol (ULTRAM) 50 MG tablet Take 1 tablet (50 mg total) by mouth every 6 (six) hours as needed. 30 tablet 0   No current facility-administered medications for this visit.     PHYSICAL EXAMINATION: ECOG PERFORMANCE STATUS: 2 - Symptomatic, <50% confined to bed  Vitals:   08/23/17 1016  BP: (!) 158/90  Pulse: 88  Resp: 20  Temp: 98.4 F (36.9 C)  SpO2: 100%   Filed Weights   08/23/17 1016  Weight: 215 lb 11.2 oz (97.8 kg)    GENERAL:alert, no distress and comfortable SKIN: skin color, texture, turgor are normal, no rashes or significant lesions EYES: normal, Conjunctiva are pink and non-injected, sclera clear OROPHARYNX:no exudate, no erythema and lips, buccal mucosa, and tongue normal  NECK: supple, thyroid normal size, non-tender, without nodularity LYMPH:  no palpable lymphadenopathy in the cervical, axillary or inguinal LUNGS: clear to auscultation and  percussion with normal breathing effort HEART: regular rate & rhythm and no murmurs and no lower extremity edema ABDOMEN:abdomen soft, non-tender and normal bowel sounds MUSCULOSKELETAL:no cyanosis of digits and no clubbing  NEURO: alert & oriented x 3 with fluent speech, no focal motor/sensory deficits EXTREMITIES: No lower extremity edema  LABORATORY DATA:  I have reviewed the data as listed CMP Latest Ref Rng & Units 08/16/2017 08/09/2017 08/02/2017  Glucose 70 - 140 mg/dl 200(H) 237(H) 243(H)  BUN 7.0 - 26.0 mg/dL 9.0 15.2 31.2(H)  Creatinine 0.6 - 1.1 mg/dL 0.8 1.1 1.9(H)  Sodium 136 - 145 mEq/L 138 140 135(L)  Potassium 3.5 - 5.1 mEq/L 3.2(L) 3.2(L) 3.6  Chloride 101 - 111 mmol/L - - -  CO2 22 - 29 mEq/L 23 23 21(L)  Calcium 8.4 - 10.4 mg/dL 7.5(L) 8.2(L) 8.5  Total Protein 6.4 - 8.3 g/dL 6.5 6.2(L) 7.0  Total Bilirubin 0.20 - 1.20 mg/dL 0.58 0.34 0.42  Alkaline Phos 40 - 150 U/L 62 65 68  AST 5 - 34 U/L 48(H) 42(H) 60(H)  ALT 0 - 55 U/L 28 26 32  Lab Results  Component Value Date   WBC 2.7 (L) 08/23/2017   HGB 8.7 (L) 08/23/2017   HCT 26.2 (L) 08/23/2017   MCV 87.9 08/23/2017   PLT 170 08/23/2017   NEUTROABS 1.7 08/23/2017    ASSESSMENT & PLAN:  Malignant neoplasm of upper-outer quadrant of left breast in female, estrogen receptor negative ([place]) 04/30/2017: Left lumpectomy: IDC grade 3, 1.7 cm, DCIS, lymphovascular invasion present, margins negative, 0/1 lymph node negative, ER 0%, PR 0%, HER-2 negative ratio 1.37, Ki-67 40%, T1c N0 stage IB  Pathology counseling: I discussed the final pathology report of the patient provided a copy of this report. I discussed the margins as well as lymph node surgeries. We also discussed the final staging along with previously performed ER/PR and HER-2/neu testing.  Recommendation: 1. adjuvant chemotherapy with dose dense Adriamycin and Cytoxan 4 followed by Taxol weekly 12 ( cannot use steroids because patient is diabetic) 2.  Followed by radiation --------------------------------------------------------------------------------------------------------------------------- Current treatment:Completed 4 cyclesofdose dense Adriamycin and Cytoxan,currently receiving Taxol Toxicities: 1. Diarrhea 2. Hypokalemia 3.  Chemotherapy-induced anemia: I am watching and monitoring her hemoglobin.  Today is week 4 RTC in 2 weeks to see me  I spent 25 minutes talking to the patient of which more than half was spent in counseling and coordination of care.  No orders of the defined types were placed in this encounter.  The patient has a good understanding of the overall plan. she agrees with it. she will call with any problems that may develop before the next visit here.   Harriette Ohara, MD 08/23/17

## 2017-08-23 NOTE — Telephone Encounter (Signed)
Per MD review proceed with treatment per heme of 8.7 and potassium of 3.2.  Due to b/p of 158/90 MD recommended for to restart bp medication ( noted as held 12/21- triamterene/HCTZ ).  Above called to treatment room RN.

## 2017-08-30 ENCOUNTER — Inpatient Hospital Stay: Payer: BC Managed Care – PPO

## 2017-08-30 VITALS — BP 109/62 | HR 89 | Temp 98.3°F | Resp 18

## 2017-08-30 DIAGNOSIS — C50412 Malignant neoplasm of upper-outer quadrant of left female breast: Secondary | ICD-10-CM

## 2017-08-30 DIAGNOSIS — Z5111 Encounter for antineoplastic chemotherapy: Secondary | ICD-10-CM | POA: Diagnosis not present

## 2017-08-30 DIAGNOSIS — Z171 Estrogen receptor negative status [ER-]: Principal | ICD-10-CM

## 2017-08-30 DIAGNOSIS — Z95828 Presence of other vascular implants and grafts: Secondary | ICD-10-CM

## 2017-08-30 LAB — CBC WITH DIFFERENTIAL/PLATELET
BASOS ABS: 0 10*3/uL (ref 0.0–0.1)
Basophils Relative: 1 %
EOS ABS: 0 10*3/uL (ref 0.0–0.5)
Eosinophils Relative: 1 %
HCT: 27.1 % — ABNORMAL LOW (ref 34.8–46.6)
HEMOGLOBIN: 8.9 g/dL — AB (ref 11.6–15.9)
LYMPHS ABS: 0.7 10*3/uL — AB (ref 0.9–3.3)
LYMPHS PCT: 29 %
MCH: 29.3 pg (ref 25.1–34.0)
MCHC: 32.7 g/dL (ref 31.5–36.0)
MCV: 89.5 fL (ref 79.5–101.0)
Monocytes Absolute: 0.3 10*3/uL (ref 0.1–0.9)
Monocytes Relative: 12 %
NEUTROS PCT: 57 %
Neutro Abs: 1.4 10*3/uL — ABNORMAL LOW (ref 1.5–6.5)
Platelets: 204 10*3/uL (ref 145–400)
RBC: 3.03 MIL/uL — AB (ref 3.70–5.45)
RDW: 16.4 % — ABNORMAL HIGH (ref 11.2–16.1)
WBC: 2.4 10*3/uL — AB (ref 3.9–10.3)

## 2017-08-30 LAB — COMPREHENSIVE METABOLIC PANEL
ALT: 40 U/L (ref 0–55)
AST: 69 U/L — ABNORMAL HIGH (ref 5–34)
Albumin: 3.5 g/dL (ref 3.5–5.0)
Alkaline Phosphatase: 74 U/L (ref 40–150)
Anion gap: 11 (ref 3–11)
BUN: 14 mg/dL (ref 7–26)
CO2: 23 mmol/L (ref 22–29)
CREATININE: 0.97 mg/dL (ref 0.60–1.10)
Calcium: 8.9 mg/dL (ref 8.4–10.4)
Chloride: 103 mmol/L (ref 98–109)
Glucose, Bld: 241 mg/dL — ABNORMAL HIGH (ref 70–140)
POTASSIUM: 3.3 mmol/L (ref 3.3–4.7)
Sodium: 137 mmol/L (ref 136–145)
TOTAL PROTEIN: 7.1 g/dL (ref 6.4–8.3)
Total Bilirubin: 0.6 mg/dL (ref 0.2–1.2)

## 2017-08-30 MED ORDER — DIPHENHYDRAMINE HCL 25 MG PO CAPS
ORAL_CAPSULE | ORAL | Status: AC
  Filled 2017-08-30: qty 2

## 2017-08-30 MED ORDER — HEPARIN SOD (PORK) LOCK FLUSH 100 UNIT/ML IV SOLN
500.0000 [IU] | Freq: Once | INTRAVENOUS | Status: AC | PRN
  Administered 2017-08-30: 500 [IU]
  Filled 2017-08-30: qty 5

## 2017-08-30 MED ORDER — SODIUM CHLORIDE 0.9% FLUSH
10.0000 mL | INTRAVENOUS | Status: DC | PRN
  Administered 2017-08-30: 10 mL
  Filled 2017-08-30: qty 10

## 2017-08-30 MED ORDER — DIPHENHYDRAMINE HCL 25 MG PO CAPS
50.0000 mg | ORAL_CAPSULE | Freq: Once | ORAL | Status: AC
  Administered 2017-08-30: 50 mg via ORAL

## 2017-08-30 MED ORDER — FAMOTIDINE IN NACL 20-0.9 MG/50ML-% IV SOLN
INTRAVENOUS | Status: AC
  Filled 2017-08-30: qty 50

## 2017-08-30 MED ORDER — SODIUM CHLORIDE 0.9% FLUSH
10.0000 mL | INTRAVENOUS | Status: DC | PRN
  Administered 2017-08-30: 10 mL via INTRAVENOUS
  Filled 2017-08-30: qty 10

## 2017-08-30 MED ORDER — PALONOSETRON HCL INJECTION 0.25 MG/5ML
0.2500 mg | Freq: Once | INTRAVENOUS | Status: AC
  Administered 2017-08-30: 0.25 mg via INTRAVENOUS

## 2017-08-30 MED ORDER — SODIUM CHLORIDE 0.9 % IV SOLN
60.0000 mg/m2 | Freq: Once | INTRAVENOUS | Status: AC
  Administered 2017-08-30: 132 mg via INTRAVENOUS
  Filled 2017-08-30: qty 22

## 2017-08-30 MED ORDER — FAMOTIDINE IN NACL 20-0.9 MG/50ML-% IV SOLN
20.0000 mg | Freq: Once | INTRAVENOUS | Status: AC
  Administered 2017-08-30: 20 mg via INTRAVENOUS

## 2017-08-30 MED ORDER — SODIUM CHLORIDE 0.9 % IV SOLN
Freq: Once | INTRAVENOUS | Status: AC
  Administered 2017-08-30: 11:00:00 via INTRAVENOUS

## 2017-08-30 MED ORDER — PALONOSETRON HCL INJECTION 0.25 MG/5ML
INTRAVENOUS | Status: AC
  Filled 2017-08-30: qty 5

## 2017-08-30 NOTE — Patient Instructions (Signed)
[facility] Discharge Instructions for Patients Receiving Chemotherapy  Today you received the following chemotherapy agents: paclitaxel (Taxol).  To help prevent nausea and vomiting after your treatment, we encourage you to take your nausea medication as prescribed.  If you develop nausea and vomiting that is not controlled by your nausea medication, call the clinic.   BELOW ARE SYMPTOMS THAT SHOULD BE REPORTED IMMEDIATELY:  *FEVER GREATER THAN 100.5 F  *CHILLS WITH OR WITHOUT FEVER  NAUSEA AND VOMITING THAT IS NOT CONTROLLED WITH YOUR NAUSEA MEDICATION  *UNUSUAL SHORTNESS OF BREATH  *UNUSUAL BRUISING OR BLEEDING  TENDERNESS IN MOUTH AND THROAT WITH OR WITHOUT PRESENCE OF ULCERS  *URINARY PROBLEMS  *BOWEL PROBLEMS  UNUSUAL RASH Items with * indicate a potential emergency and should be followed up as soon as possible.  Feel free to call the clinic should you have any questions or concerns. The clinic phone number is [phone].  Please show the CHEMO ALERT CARD at check-in to the Emergency Department and triage nurse.   

## 2017-08-30 NOTE — Progress Notes (Signed)
Pe Dr. Lindi Adie, ok to treat with ANC of 1.4.  1210- Pt reports that she vomited in the bathroom. She stated that this is not uncommon on treatment days. She states that she will take her compazine tablet now and that usually resolves the issue.

## 2017-09-06 ENCOUNTER — Encounter: Payer: Self-pay | Admitting: Adult Health

## 2017-09-06 ENCOUNTER — Inpatient Hospital Stay: Payer: BC Managed Care – PPO

## 2017-09-06 ENCOUNTER — Inpatient Hospital Stay (HOSPITAL_BASED_OUTPATIENT_CLINIC_OR_DEPARTMENT_OTHER): Payer: BC Managed Care – PPO | Admitting: Adult Health

## 2017-09-06 ENCOUNTER — Encounter: Payer: Self-pay | Admitting: *Deleted

## 2017-09-06 ENCOUNTER — Telehealth: Payer: Self-pay | Admitting: Adult Health

## 2017-09-06 VITALS — BP 102/69 | HR 95 | Temp 98.5°F | Resp 20 | Ht 65.0 in | Wt 205.9 lb

## 2017-09-06 DIAGNOSIS — C50412 Malignant neoplasm of upper-outer quadrant of left female breast: Secondary | ICD-10-CM

## 2017-09-06 DIAGNOSIS — E119 Type 2 diabetes mellitus without complications: Secondary | ICD-10-CM

## 2017-09-06 DIAGNOSIS — Z5111 Encounter for antineoplastic chemotherapy: Secondary | ICD-10-CM | POA: Diagnosis not present

## 2017-09-06 DIAGNOSIS — G620 Drug-induced polyneuropathy: Secondary | ICD-10-CM | POA: Diagnosis not present

## 2017-09-06 DIAGNOSIS — Z171 Estrogen receptor negative status [ER-]: Secondary | ICD-10-CM

## 2017-09-06 DIAGNOSIS — N939 Abnormal uterine and vaginal bleeding, unspecified: Secondary | ICD-10-CM

## 2017-09-06 DIAGNOSIS — T451X5A Adverse effect of antineoplastic and immunosuppressive drugs, initial encounter: Secondary | ICD-10-CM

## 2017-09-06 DIAGNOSIS — Z95828 Presence of other vascular implants and grafts: Secondary | ICD-10-CM

## 2017-09-06 DIAGNOSIS — R197 Diarrhea, unspecified: Secondary | ICD-10-CM | POA: Diagnosis not present

## 2017-09-06 LAB — COMPREHENSIVE METABOLIC PANEL
ALBUMIN: 3.5 g/dL (ref 3.5–5.0)
ALT: 44 U/L (ref 0–55)
ANION GAP: 12 — AB (ref 3–11)
AST: 78 U/L — AB (ref 5–34)
Alkaline Phosphatase: 75 U/L (ref 40–150)
BUN: 13 mg/dL (ref 7–26)
CHLORIDE: 101 mmol/L (ref 98–109)
CO2: 23 mmol/L (ref 22–29)
Calcium: 8.9 mg/dL (ref 8.4–10.4)
Creatinine, Ser: 0.9 mg/dL (ref 0.60–1.10)
GFR calc Af Amer: >60 mL/min (ref >60)
GFR calc non Af Amer: >60 mL/min (ref >60)
GLUCOSE: 247 mg/dL — AB (ref 70–140)
POTASSIUM: 3.1 mmol/L — AB (ref 3.3–4.7)
SODIUM: 136 mmol/L (ref 136–145)
TOTAL PROTEIN: 6.9 g/dL (ref 6.4–8.3)
Total Bilirubin: 0.8 mg/dL (ref 0.2–1.2)

## 2017-09-06 LAB — CBC WITH DIFFERENTIAL/PLATELET
BASOS ABS: 0 10*3/uL (ref 0.0–0.1)
BASOS PCT: 0 %
EOS ABS: 0 10*3/uL (ref 0.0–0.5)
Eosinophils Relative: 0 %
HEMATOCRIT: 25.8 % — AB (ref 34.8–46.6)
Hemoglobin: 8.5 g/dL — ABNORMAL LOW (ref 11.6–15.9)
Lymphocytes Relative: 28 %
Lymphs Abs: 0.6 10*3/uL — ABNORMAL LOW (ref 0.9–3.3)
MCH: 29.2 pg (ref 25.1–34.0)
MCHC: 32.9 g/dL (ref 31.5–36.0)
MCV: 88.8 fL (ref 79.5–101.0)
MONO ABS: 0.2 10*3/uL (ref 0.1–0.9)
MONOS PCT: 11 %
NEUTROS ABS: 1.3 10*3/uL — AB (ref 1.5–6.5)
Neutrophils Relative %: 61 %
PLATELETS: 182 10*3/uL (ref 145–400)
RBC: 2.9 MIL/uL — ABNORMAL LOW (ref 3.70–5.45)
RDW: 16.6 % — AB (ref 11.2–16.1)
WBC: 2.1 10*3/uL — ABNORMAL LOW (ref 3.9–10.3)

## 2017-09-06 MED ORDER — SODIUM CHLORIDE 0.9% FLUSH
10.0000 mL | INTRAVENOUS | Status: DC | PRN
  Administered 2017-09-06: 10 mL via INTRAVENOUS
  Filled 2017-09-06: qty 10

## 2017-09-06 NOTE — Progress Notes (Signed)
Pt cancelled today per Ria Comment

## 2017-09-06 NOTE — Patient Instructions (Signed)
Duloxetine delayed-release capsules What is this medicine? DULOXETINE (doo LOX e teen) is used to treat depression, anxiety, and different types of chronic pain. This medicine may be used for other purposes; ask your health care provider or pharmacist if you have questions. COMMON BRAND NAME(S): Cymbalta, Irenka What should I tell my health care provider before I take this medicine? They need to know if you have any of these conditions: -bipolar disorder or a family history of bipolar disorder -glaucoma -kidney disease -liver disease -suicidal thoughts or a previous suicide attempt -taken medicines called MAOIs like Carbex, Eldepryl, Marplan, Nardil, and Parnate within 14 days -an unusual reaction to duloxetine, other medicines, foods, dyes, or preservatives -pregnant or trying to get pregnant -breast-feeding How should I use this medicine? Take this medicine by mouth with a glass of water. Follow the directions on the prescription label. Do not cut, crush or chew this medicine. You can take this medicine with or without food. Take your medicine at regular intervals. Do not take your medicine more often than directed. Do not stop taking this medicine suddenly except upon the advice of your doctor. Stopping this medicine too quickly may cause serious side effects or your condition may worsen. A special MedGuide will be given to you by the pharmacist with each prescription and refill. Be sure to read this information carefully each time. Talk to your pediatrician regarding the use of this medicine in children. While this drug may be prescribed for children as young as 7 years of age for selected conditions, precautions do apply. Overdosage: If you think you have taken too much of this medicine contact a poison control center or emergency room at once. NOTE: This medicine is only for you. Do not share this medicine with others. What if I miss a dose? If you miss a dose, take it as soon as you  can. If it is almost time for your next dose, take only that dose. Do not take double or extra doses. What may interact with this medicine? Do not take this medicine with any of the following medications: -desvenlafaxine -levomilnacipran -linezolid -MAOIs like Carbex, Eldepryl, Marplan, Nardil, and Parnate -methylene blue (injected into a vein) -milnacipran -thioridazine -venlafaxine This medicine may also interact with the following medications: -alcohol -amphetamines -aspirin and aspirin-like medicines -certain antibiotics like ciprofloxacin and enoxacin -certain medicines for blood pressure, heart disease, irregular heart beat -certain medicines for depression, anxiety, or psychotic disturbances -certain medicines for migraine headache like almotriptan, eletriptan, frovatriptan, naratriptan, rizatriptan, sumatriptan, zolmitriptan -certain medicines that treat or prevent blood clots like warfarin, enoxaparin, and dalteparin -cimetidine -fentanyl -lithium -NSAIDS, medicines for pain and inflammation, like ibuprofen or naproxen -phentermine -procarbazine -rasagiline -sibutramine -St. John's wort -theophylline -tramadol -tryptophan This list may not describe all possible interactions. Give your health care provider a list of all the medicines, herbs, non-prescription drugs, or dietary supplements you use. Also tell them if you smoke, drink alcohol, or use illegal drugs. Some items may interact with your medicine. What should I watch for while using this medicine? Tell your doctor if your symptoms do not get better or if they get worse. Visit your doctor or health care professional for regular checks on your progress. Because it may take several weeks to see the full effects of this medicine, it is important to continue your treatment as prescribed by your doctor. Patients and their families should watch out for new or worsening thoughts of suicide or depression. Also watch out for  sudden changes in   feelings such as feeling anxious, agitated, panicky, irritable, hostile, aggressive, impulsive, severely restless, overly excited and hyperactive, or not being able to sleep. If this happens, especially at the beginning of treatment or after a change in dose, call your health care professional. Dennis Bast may get drowsy or dizzy. Do not drive, use machinery, or do anything that needs mental alertness until you know how this medicine affects you. Do not stand or sit up quickly, especially if you are an older patient. This reduces the risk of dizzy or fainting spells. Alcohol may interfere with the effect of this medicine. Avoid alcoholic drinks. This medicine can cause an increase in blood pressure. This medicine can also cause a sudden drop in your blood pressure, which may make you feel faint and increase the chance of a fall. These effects are most common when you first start the medicine or when the dose is increased, or during use of other medicines that can cause a sudden drop in blood pressure. Check with your doctor for instructions on monitoring your blood pressure while taking this medicine. Your mouth may get dry. Chewing sugarless gum or sucking hard candy, and drinking plenty of water may help. Contact your doctor if the problem does not go away or is severe. What side effects may I notice from receiving this medicine? Side effects that you should report to your doctor or health care professional as soon as possible: -allergic reactions like skin rash, itching or hives, swelling of the face, lips, or tongue -anxious -breathing problems -confusion -changes in vision -chest pain -confusion -elevated mood, decreased need for sleep, racing thoughts, impulsive behavior -eye pain -fast, irregular heartbeat -feeling faint or lightheaded, falls -feeling agitated, angry, or irritable -hallucination, loss of contact with reality -high blood pressure -loss of balance or  coordination -palpitations -redness, blistering, peeling or loosening of the skin, including inside the mouth -restlessness, pacing, inability to keep still -seizures -stiff muscles -suicidal thoughts or other mood changes -trouble passing urine or change in the amount of urine -trouble sleeping -unusual bleeding or bruising -unusually weak or tired -vomiting -yellowing of the eyes or skin Side effects that usually do not require medical attention (report to your doctor or health care professional if they continue or are bothersome): -change in sex drive or performance -change in appetite or weight -constipation -dizziness -dry mouth -headache -increased sweating -nausea -tired This list may not describe all possible side effects. Call your doctor for medical advice about side effects. You may report side effects to FDA at 1-800-FDA-1088. Where should I keep my medicine? Keep out of the reach of children. Store at room temperature between 20 and 25 degrees C (68 to 77 degrees F). Throw away any unused medicine after the expiration date. NOTE: This sheet is a summary. It may not cover all possible information. If you have questions about this medicine, talk to your doctor, pharmacist, or health care provider.  2018 Elsevier/Gold Standard (2015-12-29 18:16:03) Gabapentin capsules or tablets What is this medicine? GABAPENTIN (GA ba pen tin) is used to control partial seizures in adults with epilepsy. It is also used to treat certain types of nerve pain. This medicine may be used for other purposes; ask your health care provider or pharmacist if you have questions. COMMON BRAND NAME(S): Active-PAC with Gabapentin, Gabarone, Neurontin What should I tell my health care provider before I take this medicine? They need to know if you have any of these conditions: -kidney disease -suicidal thoughts, plans, or attempt; a previous  suicide attempt by you or a family member -an unusual or  allergic reaction to gabapentin, other medicines, foods, dyes, or preservatives -pregnant or trying to get pregnant -breast-feeding How should I use this medicine? Take this medicine by mouth with a glass of water. Follow the directions on the prescription label. You can take it with or without food. If it upsets your stomach, take it with food.Take your medicine at regular intervals. Do not take it more often than directed. Do not stop taking except on your doctor's advice. If you are directed to break the 600 or 800 mg tablets in half as part of your dose, the extra half tablet should be used for the next dose. If you have not used the extra half tablet within 28 days, it should be thrown away. A special MedGuide will be given to you by the pharmacist with each prescription and refill. Be sure to read this information carefully each time. Talk to your pediatrician regarding the use of this medicine in children. Special care may be needed. Overdosage: If you think you have taken too much of this medicine contact a poison control center or emergency room at once. NOTE: This medicine is only for you. Do not share this medicine with others. What if I miss a dose? If you miss a dose, take it as soon as you can. If it is almost time for your next dose, take only that dose. Do not take double or extra doses. What may interact with this medicine? Do not take this medicine with any of the following medications: -other gabapentin products This medicine may also interact with the following medications: -alcohol -antacids -antihistamines for allergy, cough and cold -certain medicines for anxiety or sleep -certain medicines for depression or psychotic disturbances -homatropine; hydrocodone -naproxen -narcotic medicines (opiates) for pain -phenothiazines like chlorpromazine, mesoridazine, prochlorperazine, thioridazine This list may not describe all possible interactions. Give your health care provider  a list of all the medicines, herbs, non-prescription drugs, or dietary supplements you use. Also tell them if you smoke, drink alcohol, or use illegal drugs. Some items may interact with your medicine. What should I watch for while using this medicine? Visit your doctor or health care professional for regular checks on your progress. You may want to keep a record at home of how you feel your condition is responding to treatment. You may want to share this information with your doctor or health care professional at each visit. You should contact your doctor or health care professional if your seizures get worse or if you have any new types of seizures. Do not stop taking this medicine or any of your seizure medicines unless instructed by your doctor or health care professional. Stopping your medicine suddenly can increase your seizures or their severity. Wear a medical identification bracelet or chain if you are taking this medicine for seizures, and carry a card that lists all your medications. You may get drowsy, dizzy, or have blurred vision. Do not drive, use machinery, or do anything that needs mental alertness until you know how this medicine affects you. To reduce dizzy or fainting spells, do not sit or stand up quickly, especially if you are an older patient. Alcohol can increase drowsiness and dizziness. Avoid alcoholic drinks. Your mouth may get dry. Chewing sugarless gum or sucking hard candy, and drinking plenty of water will help. The use of this medicine may increase the chance of suicidal thoughts or actions. Pay special attention to how you are  responding while on this medicine. Any worsening of mood, or thoughts of suicide or dying should be reported to your health care professional right away. Women who become pregnant while using this medicine may enroll in the [place] Pregnancy Registry by calling [phone]. This registry collects information about the safety  of antiepileptic drug use during pregnancy. What side effects may I notice from receiving this medicine? Side effects that you should report to your doctor or health care professional as soon as possible: -allergic reactions like skin rash, itching or hives, swelling of the face, lips, or tongue -worsening of mood, thoughts or actions of suicide or dying Side effects that usually do not require medical attention (report to your doctor or health care professional if they continue or are bothersome): -constipation -difficulty walking or controlling muscle movements -dizziness -nausea -slurred speech -tiredness -tremors -weight gain This list may not describe all possible side effects. Call your doctor for medical advice about side effects. You may report side effects to FDA at 1-800-FDA-1088. Where should I keep my medicine? Keep out of reach of children. This medicine may cause accidental overdose and death if it taken by other adults, children, or pets. Mix any unused medicine with a substance like cat litter or coffee grounds. Then throw the medicine away in a sealed container like a sealed bag or a coffee can with a lid. Do not use the medicine after the expiration date. Store at room temperature between 15 and 30 degrees C (59 and 86 degrees F). NOTE: This sheet is a summary. It may not cover all possible information. If you have questions about this medicine, talk to your doctor, pharmacist, or health care provider.  2018 Elsevier/Gold Standard (2013-09-25 15:26:50)

## 2017-09-06 NOTE — Progress Notes (Addendum)
[facility]:    [name], MD [address] [place] [address]   DIAGNOSIS: Cancer Staging Malignant neoplasm of upper-outer quadrant of left breast in female, estrogen receptor negative ([place]) Staging form: Breast, AJCC 8th Edition - Clinical: No stage assigned - Unsigned - Pathologic: Stage IB (pT1c, pN0, cM0, G3, ER: Negative, PR: Negative, HER2: Negative) - Signed by Eppie Gibson, MD on 05/14/2017   SUMMARY OF ONCOLOGIC HISTORY:   Malignant neoplasm of upper-outer quadrant of left breast in female, estrogen receptor negative ([place])   04/05/2017 Initial Diagnosis    Left breast asymmetry by ultrasound measured 1.3 cm at 2:30 position 10 cm from nipple, no axillary lymph nodes; biopsy IDC grade 2, ER 0%, PR 0%, HER-2 negative ratio 1.37, Ki-67 40%, T1c N0 stage IB AJCC 8       04/30/2017 Surgery    Left lumpectomy: IDC grade 3, 1.7 cm, DCIS, lymphovascular invasion present, margins negative, 0/1 lymph node negative, ER 0%, PR 0%, HER-2 negative ratio 1.37, Ki-67 40%, T1c N0 stage IB      05/22/2017 Genetic Testing    Patient had genetic testing due to a personal history of triple negative breast cancer.  The Common Hereditary Cancer Panel was ordered. The Hereditary Gene Panel offered by [facility] includes sequencing and/or deletion duplication testing of the following 46 genes: APC, ATM, AXIN2, BARD1, BMPR1A, BRCA1, BRCA2, BRIP1, CDH1, CDKN2A (p14ARF), CDKN2A (p16INK4a), CHEK2, CTNNA1, DICER1, EPCAM (Deletion/duplication testing only), GREM1 (promoter region deletion/duplication testing only), KIT, MEN1, MLH1, MSH2, MSH3, MSH6, MUTYH, NBN, NF1, NHTL1, PALB2, PDGFRA, PMS2, POLD1, POLE, PTEN, RAD50, RAD51C, RAD51D, SDHB, SDHC, SDHD, SMAD4, SMARCA4. STK11, TP53, TSC1, TSC2, and VHL.  The following genes were evaluated for sequence changes only: SDHA and HOXB13 c.251G>A variant only.    Results: No pathogenic mutations identified.  A VUS in  ATM c.4279G>A (p.Ala1427Thr) was identified.  The date of this test report is 05/22/2017.       05/24/2017 -  Chemotherapy    Dose dense Adriamycin and Cytoxan 4 followed by Taxol weekly 12        CURRENT THERAPY: Adjuvant Taxol  INTERVAL HISTORY: Audrey Peters 62 y.o. female returns for evaluation prior to receiving her weekly Taxol.  She is concerned due to worsening peripheral neuropathy.  This has come on all of the sudden.  IT started last week prior to her chemotherapy.  It was mild.  It has rapidly worsened and now extends down to her mid finger.  She is having difficulty with putting in her earrings and writing with a pen.  She also notes that she has had some vaginal bleeding over the past week.  It has been years since she has had a menstrual cycle.    Patient Active Problem List   Diagnosis Date Noted  . Encounter for antineoplastic chemotherapy 06/07/2017  . Port-A-Cath in place 05/24/2017  . Genetic testing 05/23/2017  . Malignant neoplasm of upper-outer quadrant of left breast in female, estrogen receptor negative ([place]) 04/22/2017  . Essential hypertension 01/30/2015  . Diabetes mellitus ([place]) 01/30/2015    is allergic to invokana [canagliflozin]; sulfa antibiotics; and ciprofloxacin.  MEDICAL HISTORY: Past Medical History:  Diagnosis Date  . Anemia yrs ago  . Arthritis   . Breast cancer ([place])   . Cancer [facility])    recent dx in breast  . Carpal tunnel syndrome of right wrist   . Diabetes mellitus without complication ([place])  dx 2008  . Headache    sinus  . Hypertension   . Personal history of chemotherapy   . Sleep apnea    does not use cpap  . Vaginal delivery 1983    SURGICAL HISTORY: Past Surgical History:  Procedure Laterality Date  . BREAST LUMPECTOMY Left   . BREAST LUMPECTOMY WITH RADIOACTIVE SEED AND SENTINEL LYMPH NODE BIOPSY Left 04/30/2017   Procedure: LEFT BREAST LUMPECTOMY WITH RADIOACTIVE SEED AND LEFT SENTINEL LYMPH NODE BIOPSY ERAS  PATHWAY;  Surgeon: Erroll Luna, MD;  Location: [facility];  Service: General;  Laterality: Left;  . COLONOSCOPY WITH PROPOFOL N/A 05/28/2016   Procedure: COLONOSCOPY WITH PROPOFOL;  Surgeon: Garlan Fair, MD;  Location: [facility];  Service: Endoscopy;  Laterality: N/A;  . DILATION AND CURETTAGE OF UTERUS    . HYSTEROSCOPY W/D&C N/A 07/21/2015   Procedure: DILATATION AND CURETTAGE /HYSTEROSCOPY with myosure;  Surgeon: Janyth Pupa, DO;  Location: [facility];  Service: Gynecology;  Laterality: N/A;  . PORTACATH PLACEMENT Right 04/30/2017   Procedure: INSERTION PORT-A-CATH;  Surgeon: Erroll Luna, MD;  Location: [facility];  Service: General;  Laterality: Right;    SOCIAL HISTORY: Social History   Socioeconomic History  . Marital status: Married    Spouse name: Not on file  . Number of children: Not on file  . Years of education: Not on file  . Highest education level: Not on file  Social Needs  . Financial resource strain: Not on file  . Food insecurity - worry: Not on file  . Food insecurity - inability: Not on file  . Transportation needs - medical: Not on file  . Transportation needs - non-medical: Not on file  Occupational History  . Not on file  Tobacco Use  . Smoking status: Never Smoker  . Smokeless tobacco: Never Used  Substance and Sexual Activity  . Alcohol use: Yes    Alcohol/week: 0.0 oz    Comment: occ wine  . Drug use: No  . Sexual activity: Not on file  Other Topics Concern  . Not on file  Social History Narrative  . Not on file    FAMILY HISTORY: Family History  Problem Relation Age of Onset  . Diabetes Mother   . Hypertension Mother     Review of Systems  Constitutional: Negative for appetite change, chills, fatigue, fever and unexpected weight change.  HENT:   Negative for hearing loss, lump/mass and trouble swallowing.   Eyes: Negative for eye problems and icterus.  Respiratory: Negative for chest tightness, cough and shortness of breath.    Cardiovascular: Negative for chest pain, leg swelling and palpitations.  Gastrointestinal: Negative for abdominal distention, abdominal pain, constipation, diarrhea, nausea and vomiting.  Endocrine: Negative for hot flashes.  Genitourinary: Positive for vaginal bleeding. Negative for difficulty urinating.   Musculoskeletal: Negative for arthralgias and back pain.  Skin: Negative for itching and rash.  Neurological: Positive for numbness. Negative for dizziness, extremity weakness and headaches.  Psychiatric/Behavioral: Negative for depression. The patient is not nervous/anxious.       PHYSICAL EXAMINATION  ECOG PERFORMANCE STATUS: 1 - Symptomatic but completely ambulatory  Vitals:   09/06/17 0941  BP: 102/69  Pulse: 95  Resp: 20  Temp: 98.5 F (36.9 C)  SpO2: 100%    Physical Exam  Constitutional: She is oriented to person, place, and time and well-developed, well-nourished, and in no distress.  HENT:  Head: Normocephalic and atraumatic.  Mouth/Throat: Oropharynx is clear and moist. No oropharyngeal exudate.  Eyes: Pupils are equal, round, and reactive to light. No scleral icterus.  Neck: Neck supple.  Cardiovascular: Normal rate, regular rhythm and normal heart sounds.  Pulmonary/Chest: Effort normal and breath sounds normal. No respiratory distress. She has no wheezes. She has no rales. She exhibits no tenderness.  Abdominal: Soft. Bowel sounds are normal. She exhibits no distension and no mass. There is no tenderness. There is no rebound and no guarding.  Musculoskeletal: She exhibits no edema.  Lymphadenopathy:    She has no cervical adenopathy.  Neurological: She is alert and oriented to person, place, and time. No cranial nerve deficit.  Skin: Skin is warm and dry. No rash noted. No erythema.  Psychiatric: Mood and affect normal.    LABORATORY DATA:  CBC    Component Value Date/Time   WBC 2.1 (L) 09/06/2017 0850   RBC 2.90 (L) 09/06/2017 0850   HGB 8.5 (L)  09/06/2017 0850   HGB 9.0 (L) 08/16/2017 0945   HCT 25.8 (L) 09/06/2017 0850   HCT 27.5 (L) 08/16/2017 0945   PLT 182 09/06/2017 0850   PLT 183 08/16/2017 0945   MCV 88.8 09/06/2017 0850   MCV 88.7 08/16/2017 0945   MCH 29.2 09/06/2017 0850   MCHC 32.9 09/06/2017 0850   RDW 16.6 (H) 09/06/2017 0850   RDW 15.4 (H) 08/16/2017 0945   LYMPHSABS 0.6 (L) 09/06/2017 0850   LYMPHSABS 0.9 08/16/2017 0945   MONOABS 0.2 09/06/2017 0850   MONOABS 0.4 08/16/2017 0945   EOSABS 0.0 09/06/2017 0850   EOSABS 0.0 08/16/2017 0945   BASOSABS 0.0 09/06/2017 0850   BASOSABS 0.0 08/16/2017 0945    CMP     Component Value Date/Time   NA 136 09/06/2017 0850   NA 138 08/16/2017 0945   K 3.1 (L) 09/06/2017 0850   K 3.2 (L) 08/16/2017 0945   CL 101 09/06/2017 0850   CO2 23 09/06/2017 0850   CO2 23 08/16/2017 0945   GLUCOSE 247 (H) 09/06/2017 0850   GLUCOSE 200 (H) 08/16/2017 0945   BUN 13 09/06/2017 0850   BUN 9.0 08/16/2017 0945   CREATININE 0.90 09/06/2017 0850   CREATININE 0.8 08/16/2017 0945   CALCIUM 8.9 09/06/2017 0850   CALCIUM 7.5 (L) 08/16/2017 0945   PROT 6.9 09/06/2017 0850   PROT 6.5 08/16/2017 0945   ALBUMIN 3.5 09/06/2017 0850   ALBUMIN 3.0 (L) 08/16/2017 0945   AST 78 (H) 09/06/2017 0850   AST 48 (H) 08/16/2017 0945   ALT 44 09/06/2017 0850   ALT 28 08/16/2017 0945   ALKPHOS 75 09/06/2017 0850   ALKPHOS 62 08/16/2017 0945   BILITOT 0.8 09/06/2017 0850   BILITOT 0.58 08/16/2017 0945   GFRNONAA >60 09/06/2017 0850   GFRAA >60 09/06/2017 0850      ASSESSMENT and PLAN:   Malignant neoplasm of upper-outer quadrant of left breast in female, estrogen receptor negative ([place]) 04/30/2017: Left lumpectomy: IDC grade 3, 1.7 cm, DCIS, lymphovascular invasion present, margins negative, 0/1 lymph node negative, ER 0%, PR 0%, HER-2 negative ratio 1.37, Ki-67 40%, T1c N0 stage IB  Pathology counseling: I discussed the final pathology report of the patient provided a copy of this  report. I discussed the margins as well as lymph node surgeries. We also discussed the final staging along with previously performed ER/PR and HER-2/neu testing.  Recommendation: 1. adjuvant chemotherapy with dose dense Adriamycin and Cytoxan 4 followed by Taxol weekly 12 ( cannot use steroids because patient is diabetic) 2. Followed by radiation ---------------------------------------------------------------------------------------------------------------------------  Current treatment:Completed 4 cyclesofdose dense Adriamycin and Cytoxan,currently receiving Taxol Toxicities: 1. Diarrhea 2. Persistent peripheral neuropathy  Today is week 6, however due to her worsening persistent peripheral neuropathy, now interfering with motor activities, I have canceled her chemotherapy for today.  I gave her reassurance.  Hopefully, her neuropathy will improve, however if it doesn't then we can certainly prescribe medication, such as Gabapentin or Cymbalta to help with the numbness.  I gave her detailed info about these in her AVS.    In regards to the vaginal bleeding, I recommended that she see her GYN ASAP for eval.  Novice will return in one week for labs, and f/u with Dr. Lindi Adie to discuss either discontinuation of the Taxol and her adjuvant chemotherapy, or changing her to a different treatment.      All questions were answered. The patient knows to call the clinic with any problems, questions or concerns. We can certainly see the patient much sooner if necessary.  A total of (30) minutes of face-to-face time was spent with this patient with greater than 50% of that time in counseling and care-coordination.   A total of (30) minutes of face-to-face time was spent with this patient with greater than 50% of that time in counseling and care-coordination.  This note was electronically signed. Scot Dock, NP 09/09/2017

## 2017-09-06 NOTE — Telephone Encounter (Signed)
Gave patient AVS and calendar of upcoming February appointments.  °

## 2017-09-06 NOTE — Assessment & Plan Note (Addendum)
04/30/2017: Left lumpectomy: IDC grade 3, 1.7 cm, DCIS, lymphovascular invasion present, margins negative, 0/1 lymph node negative, ER 0%, PR 0%, HER-2 negative ratio 1.37, Ki-67 40%, T1c N0 stage IB  Pathology counseling: I discussed the final pathology report of the patient provided a copy of this report. I discussed the margins as well as lymph node surgeries. We also discussed the final staging along with previously performed ER/PR and HER-2/neu testing.  Recommendation: 1. adjuvant chemotherapy with dose dense Adriamycin and Cytoxan 4 followed by Taxol weekly 12 ( cannot use steroids because patient is diabetic) 2. Followed by radiation --------------------------------------------------------------------------------------------------------------------------- Current treatment:Completed 4 cyclesofdose dense Adriamycin and Cytoxan,currently receiving Taxol Toxicities: 1. Diarrhea 2. Persistent peripheral neuropathy  Today is week 6, however due to her worsening persistent peripheral neuropathy, now interfering with motor activities, I have canceled her chemotherapy for today.  I gave her reassurance.  Hopefully, her neuropathy will improve, however if it doesn't then we can certainly prescribe medication, such as Gabapentin or Cymbalta to help with the numbness.  I gave her detailed info about these in her AVS.    In regards to the vaginal bleeding, I recommended that she see her GYN ASAP for eval.  Audrey Peters will return in one week for labs, and f/u with Dr. Lindi Adie to discuss either discontinuation of the Taxol and her adjuvant chemotherapy, or changing her to a different treatment.

## 2017-09-09 ENCOUNTER — Telehealth: Payer: Self-pay

## 2017-09-09 NOTE — Telephone Encounter (Signed)
Left VM for pt to increase her potassium to 65meq daily from 42meq. Will recheck labs on 02/01.  Cyndia Bent RN

## 2017-09-11 ENCOUNTER — Telehealth: Payer: Self-pay

## 2017-09-11 NOTE — Telephone Encounter (Signed)
Nutrition Assessment   Reason for Assessment:   Patient identified on Malnutrition Screening report for weight loss and poor appetite  ASSESSMENT:  62 year old female with left breast cancer. Patient s/p lumpectomy. Patient has completed adriamycin and cytoxan times 4 weeks and currently receiving taxol. Past medical history of HTN, DM.    Spoke with patient via phone today to introduce nutrition service.  Patient reports that her appetite is poor. Reports nausea as well.  Reports that she has been eating small frequent meals.  Also reports drinking ensure original mixed with fruit and yogurt daily.    Nutrition Focused Physical Exam: deferred  Medications: zofran, compazine, metformin, K Cl, metformin, regular insulin  Labs: reviewed  Anthropometrics:   Height: 65 inches Weight: 205 lb 14.4 oz UBW: 220s BMI: 34 9% weight loss in the last month   Estimated Energy Needs  Kcals: 2000-2300 calories/d Protein: 100-115 g/d Fluid: 2 L/d  NUTRITION DIAGNOSIS: Inadequate oral intake related to cancer and cancer treatment related side effects as evidenced by 9% weight loss and eating < 50% of energy needs for > or equal to 5 days.   MALNUTRITION DIAGNOSIS: patient meets criteria for severe malnutrition in context of acute illness as evidenced by 9% weight loss and eating < 50% of energy needs for > or equal to 5 days.   INTERVENTION:   Offered nutrition appointment and patient declined at this time wanting to discuss nutrition over the phone.   Discussed ways to increase calories and protein with patient.  Handwritten information given to Nurse [place] to give to patient in clinic on Feb 1 per patient request.  Encouraged oral nutrition supplement of 350 calories or more 1-2 times per day to increase calories and protein Encouraged patient to take nausea medication to help relieve symptoms and allow patient to eat.   Contact information provided     MONITORING,  EVALUATION, GOAL: Patient will consume adequate calories and protein to maintain weight   NEXT VISIT: as needed, patient to contact  Jaliza Seifried B. Zenia Resides, [place], [place] Registered Dietitian [phone] (pager)

## 2017-09-12 NOTE — Assessment & Plan Note (Signed)
04/30/2017: Left lumpectomy: IDC grade 3, 1.7 cm, DCIS, lymphovascular invasion present, margins negative, 0/1 lymph node negative, ER 0%, PR 0%, HER-2 negative ratio 1.37, Ki-67 40%, T1c N0 stage IB  Pathology counseling: I discussed the final pathology report of the patient provided a copy of this report. I discussed the margins as well as lymph node surgeries. We also discussed the final staging along with previously performed ER/PR and HER-2/neu testing.  Recommendation: 1. adjuvant chemotherapy with dose dense Adriamycin and Cytoxan 4 followed by Taxol weekly 12 ( cannot use steroids because patient is diabetic) 2. Followed by radiation --------------------------------------------------------------------------------------------------------------------------- Current treatment:Completed 4 cyclesofdose dense Adriamycin and Cytoxan,today is cycle 6 Taxol  Toxicities: 1. Diarrhea 2. Persistent peripheral neuropathy  We decided to stop further chemo due to worsening neuropathy

## 2017-09-13 ENCOUNTER — Inpatient Hospital Stay: Payer: BC Managed Care – PPO | Attending: Hematology and Oncology

## 2017-09-13 ENCOUNTER — Inpatient Hospital Stay: Payer: BC Managed Care – PPO

## 2017-09-13 ENCOUNTER — Inpatient Hospital Stay (HOSPITAL_BASED_OUTPATIENT_CLINIC_OR_DEPARTMENT_OTHER): Payer: BC Managed Care – PPO | Admitting: Hematology and Oncology

## 2017-09-13 ENCOUNTER — Encounter: Payer: Self-pay | Admitting: *Deleted

## 2017-09-13 DIAGNOSIS — G620 Drug-induced polyneuropathy: Secondary | ICD-10-CM | POA: Diagnosis not present

## 2017-09-13 DIAGNOSIS — C50412 Malignant neoplasm of upper-outer quadrant of left female breast: Secondary | ICD-10-CM | POA: Diagnosis not present

## 2017-09-13 DIAGNOSIS — T451X5A Adverse effect of antineoplastic and immunosuppressive drugs, initial encounter: Secondary | ICD-10-CM | POA: Insufficient documentation

## 2017-09-13 DIAGNOSIS — Z171 Estrogen receptor negative status [ER-]: Secondary | ICD-10-CM

## 2017-09-13 DIAGNOSIS — Z95828 Presence of other vascular implants and grafts: Secondary | ICD-10-CM

## 2017-09-13 LAB — CBC WITH DIFFERENTIAL/PLATELET
BASOS ABS: 0 10*3/uL (ref 0.0–0.1)
BASOS PCT: 0 %
EOS ABS: 0 10*3/uL (ref 0.0–0.5)
EOS PCT: 1 %
HCT: 27.4 % — ABNORMAL LOW (ref 34.8–46.6)
Hemoglobin: 8.9 g/dL — ABNORMAL LOW (ref 11.6–15.9)
LYMPHS ABS: 0.6 10*3/uL — AB (ref 0.9–3.3)
Lymphocytes Relative: 21 %
MCH: 29.6 pg (ref 25.1–34.0)
MCHC: 32.5 g/dL (ref 31.5–36.0)
MCV: 91 fL (ref 79.5–101.0)
Monocytes Absolute: 0.6 10*3/uL (ref 0.1–0.9)
Monocytes Relative: 20 %
Neutro Abs: 1.8 10*3/uL (ref 1.5–6.5)
Neutrophils Relative %: 58 %
PLATELETS: 204 10*3/uL (ref 145–400)
RBC: 3.01 MIL/uL — AB (ref 3.70–5.45)
RDW: 17.9 % — ABNORMAL HIGH (ref 11.2–14.5)
WBC: 3.1 10*3/uL — AB (ref 3.9–10.3)

## 2017-09-13 LAB — COMPREHENSIVE METABOLIC PANEL
ALK PHOS: 71 U/L (ref 40–150)
ALT: 33 U/L (ref 0–55)
AST: 52 U/L — AB (ref 5–34)
Albumin: 3.2 g/dL — ABNORMAL LOW (ref 3.5–5.0)
Anion gap: 10 (ref 3–11)
BILIRUBIN TOTAL: 0.5 mg/dL (ref 0.2–1.2)
BUN: 12 mg/dL (ref 7–26)
CALCIUM: 8.8 mg/dL (ref 8.4–10.4)
CO2: 21 mmol/L — ABNORMAL LOW (ref 22–29)
CREATININE: 0.87 mg/dL (ref 0.60–1.10)
Chloride: 107 mmol/L (ref 98–109)
Glucose, Bld: 274 mg/dL — ABNORMAL HIGH (ref 70–140)
Potassium: 4.3 mmol/L (ref 3.5–5.1)
Sodium: 138 mmol/L (ref 136–145)
Total Protein: 6.3 g/dL — ABNORMAL LOW (ref 6.4–8.3)

## 2017-09-13 MED ORDER — GABAPENTIN 300 MG PO CAPS: 300.0000 mg | ORAL_CAPSULE | Freq: Every day | ORAL | 3 refills | Status: DC

## 2017-09-13 MED ORDER — SODIUM CHLORIDE 0.9% FLUSH
10.0000 mL | INTRAVENOUS | Status: DC | PRN
  Administered 2017-09-13: 10 mL via INTRAVENOUS
  Filled 2017-09-13: qty 10

## 2017-09-13 NOTE — Progress Notes (Signed)
Patient Care Team: [name], MD as PCP - General (Internal Medicine)  DIAGNOSIS:  Encounter Diagnosis  Name Primary?  . Malignant neoplasm of upper-outer quadrant of left breast in female, estrogen receptor negative ([place])     SUMMARY OF ONCOLOGIC HISTORY:   Malignant neoplasm of upper-outer quadrant of left breast in female, estrogen receptor negative ([place])   04/05/2017 Initial Diagnosis    Left breast asymmetry by ultrasound measured 1.3 cm at 2:30 position 10 cm from nipple, no axillary lymph nodes; biopsy IDC grade 2, ER 0%, PR 0%, HER-2 negative ratio 1.37, Ki-67 40%, T1c N0 stage IB AJCC 8       04/30/2017 Surgery    Left lumpectomy: IDC grade 3, 1.7 cm, DCIS, lymphovascular invasion present, margins negative, 0/1 lymph node negative, ER 0%, PR 0%, HER-2 negative ratio 1.37, Ki-67 40%, T1c N0 stage IB      05/22/2017 Genetic Testing    Patient had genetic testing due to a personal history of triple negative breast cancer.  The Common Hereditary Cancer Panel was ordered. The Hereditary Gene Panel offered by [facility] includes sequencing and/or deletion duplication testing of the following 46 genes: APC, ATM, AXIN2, BARD1, BMPR1A, BRCA1, BRCA2, BRIP1, CDH1, CDKN2A (p14ARF), CDKN2A (p16INK4a), CHEK2, CTNNA1, DICER1, EPCAM (Deletion/duplication testing only), GREM1 (promoter region deletion/duplication testing only), KIT, MEN1, MLH1, MSH2, MSH3, MSH6, MUTYH, NBN, NF1, NHTL1, PALB2, PDGFRA, PMS2, POLD1, POLE, PTEN, RAD50, RAD51C, RAD51D, SDHB, SDHC, SDHD, SMAD4, SMARCA4. STK11, TP53, TSC1, TSC2, and VHL.  The following genes were evaluated for sequence changes only: SDHA and HOXB13 c.251G>A variant only.    Results: No pathogenic mutations identified.  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  The date of this test report is 05/22/2017.       05/24/2017 - 08/30/2017 Chemotherapy    Dose dense Adriamycin and Cytoxan 4 followed by Taxol weekly 5 (stopped early for  neuropathy)       CHIEF COMPLIANT: Profound neuropathy causing discontinuation of chemo  INTERVAL HISTORY: Audrey Peters is a 62 year old with above-mentioned history of left breast cancer treated with lumpectomy and is currently on adjuvant chemotherapy and she completed 6 cycles of treatment and her treatment was stopped early as of today because of worsening neuropathy.  She has pain in her hands and feet that appears to have gotten worse.  REVIEW OF SYSTEMS:   Constitutional: Denies fevers, chills or abnormal weight loss Eyes: Denies blurriness of vision Ears, nose, mouth, throat, and face: Denies mucositis or sore throat Respiratory: Denies cough, dyspnea or wheezes Cardiovascular: Denies palpitation, chest discomfort Gastrointestinal:  Denies nausea, heartburn or change in bowel habits Skin: Denies abnormal skin rashes Lymphatics: Denies new lymphadenopathy or easy bruising Neurological: Neuropathy in hands and feet Behavioral/Psych: Mood is stable, no new changes  Extremities: No lower extremity edema Breast:  denies any pain or lumps or nodules in either breasts All other systems were reviewed with the patient and are negative.  I have reviewed the past medical history, past surgical history, social history and family history with the patient and they are unchanged from previous note.  ALLERGIES:  is allergic to invokana [canagliflozin]; sulfa antibiotics; and ciprofloxacin.  MEDICATIONS:  Current Outpatient Medications  Medication Sig Dispense Refill  . acetaminophen (TYLENOL 8 HOUR ARTHRITIS PAIN) 650 MG CR tablet Take 650 mg by mouth every 8 (eight) hours as needed for pain.    . clotrimazole (LOTRIMIN) 1 % cream Apply 1 application topically daily as needed (for irritated/itchy skin.).     Marland Kitchen  Dulaglutide (TRULICITY) 1.5 CZ/6.6AY SOPN Inject 1.5 mg into the skin every Tuesday.    . estradiol (ESTRACE) 0.1 MG/GM vaginal cream Place 1 Applicatorful vaginally 2 (two) times  a week.    . gabapentin (NEURONTIN) 300 MG capsule Take 1 capsule (300 mg total) by mouth at bedtime. 30 capsule 3  . insulin regular human CONCENTRATED (HUMULIN R U-500 KWIKPEN) 500 UNIT/ML kwikpen Inject 50-120 Units into the skin 3 (three) times daily with meals. 120 units in the morning, 50 units at lunch and 50 units at supper.    . losartan (COZAAR) 100 MG tablet Take 100 mg by mouth daily.    . magic mouthwash SOLN Take 5 mLs 4 (four) times daily as needed by mouth for mouth pain. 240 mL 0  . metFORMIN (GLUCOPHAGE) 1000 MG tablet Take 1,000 mg by mouth daily.    . potassium chloride SA (K-DUR,KLOR-CON) 20 MEQ tablet Take 1 tablet (20 mEq total) by mouth daily. 30 tablet 0  . traMADol (ULTRAM) 50 MG tablet Take 1 tablet (50 mg total) by mouth every 6 (six) hours as needed. 30 tablet 0   No current facility-administered medications for this visit.     PHYSICAL EXAMINATION: ECOG PERFORMANCE STATUS: 1 - Symptomatic but completely ambulatory  Vitals:   09/13/17 0947  BP: (!) 148/81  Pulse: 95  Resp: 18  Temp: (!) 97.3 F (36.3 C)  SpO2: 100%   Filed Weights   09/13/17 0947  Weight: 211 lb 12.8 oz (96.1 kg)    GENERAL:alert, no distress and comfortable SKIN: skin color, texture, turgor are normal, no rashes or significant lesions EYES: normal, Conjunctiva are pink and non-injected, sclera clear OROPHARYNX:no exudate, no erythema and lips, buccal mucosa, and tongue normal  NECK: supple, thyroid normal size, non-tender, without nodularity LYMPH:  no palpable lymphadenopathy in the cervical, axillary or inguinal LUNGS: clear to auscultation and percussion with normal breathing effort HEART: regular rate & rhythm and no murmurs and no lower extremity edema ABDOMEN:abdomen soft, non-tender and normal bowel sounds MUSCULOSKELETAL:no cyanosis of digits and no clubbing  NEURO: alert & oriented x 3 with fluent speech, grade 2-3 peripheral neuropathy EXTREMITIES: No lower extremity  edema   LABORATORY DATA:  I have reviewed the data as listed CMP Latest Ref Rng & Units 09/13/2017 09/06/2017 08/30/2017  Glucose 70 - 140 mg/dL 274(H) 247(H) 241(H)  BUN 7 - 26 mg/dL '12 13 14  ' Creatinine 0.60 - 1.10 mg/dL 0.87 0.90 0.97  Sodium 136 - 145 mmol/L 138 136 137  Potassium 3.5 - 5.1 mmol/L 4.3 3.1(L) 3.3  Chloride 98 - 109 mmol/L 107 101 103  CO2 22 - 29 mmol/L 21(L) 23 23  Calcium 8.4 - 10.4 mg/dL 8.8 8.9 8.9  Total Protein 6.4 - 8.3 g/dL 6.3(L) 6.9 7.1  Total Bilirubin 0.2 - 1.2 mg/dL 0.5 0.8 0.6  Alkaline Phos 40 - 150 U/L 71 75 74  AST 5 - 34 U/L 52(H) 78(H) 69(H)  ALT 0 - 55 U/L 33 44 40    Lab Results  Component Value Date   WBC 3.1 (L) 09/13/2017   HGB 8.9 (L) 09/13/2017   HCT 27.4 (L) 09/13/2017   MCV 91.0 09/13/2017   PLT 204 09/13/2017   NEUTROABS 1.8 09/13/2017    ASSESSMENT & PLAN:  Malignant neoplasm of upper-outer quadrant of left breast in female, estrogen receptor negative ([place]) 04/30/2017: Left lumpectomy: IDC grade 3, 1.7 cm, DCIS, lymphovascular invasion present, margins negative, 0/1 lymph node negative, ER 0%,  PR 0%, HER-2 negative ratio 1.37, Ki-67 40%, T1c N0 stage IB  Pathology counseling: I discussed the final pathology report of the patient provided a copy of this report. I discussed the margins as well as lymph node surgeries. We also discussed the final staging along with previously performed ER/PR and HER-2/neu testing.  Recommendation: 1. adjuvant chemotherapy with dose dense Adriamycin and Cytoxan 4 followed by Taxol weekly 12 ( cannot use steroids because patient is diabetic) 2. Followed by radiation --------------------------------------------------------------------------------------------------------------------------- Current treatment:Completed 4 cyclesofdose dense Adriamycin and Cytoxan,today is cycle 6 Taxol being discontinued because of neuropathy.  Toxicities: 1. Diarrhea 2. Persistent peripheral neuropathy: It  has gotten significantly worse and hence we will stop chemo.  We decided to stop further chemo due to worsening neuropathy Return to clinic after radiation is complete.  I spent 25 minutes talking to the patient of which more than half was spent in counseling and coordination of care.  No orders of the defined types were placed in this encounter.  The patient has a good understanding of the overall plan. she agrees with it. she will call with any problems that may develop before the next visit here.   Harriette Ohara, MD 09/13/17

## 2017-09-16 ENCOUNTER — Encounter: Payer: Self-pay | Admitting: Radiation Oncology

## 2017-09-18 NOTE — Progress Notes (Signed)
Location of Breast Cancer: Left Breast  Histology per Pathology Report:  04/05/17 Diagnosis Breast, left, needle core biopsy, OU, 2:30 o'clock position - INVASIVE DUCTAL CARCINOMA, SEE COMMENT.  Receptor Status: ER(NEG), PR (NEG), Her2-neu (NEG), Ki-(40%)  04/30/17 Diagnosis 1. 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 Lymph node, sentinel, biopsy, Left Axillary ONE BENIGN LYMPH NODE (0/1) 3. Breast, excision, Left additional Medial Margin BENIGN FIBROMUSCULAR TISSUE  Did patient present with symptoms or was this found on screening mammography?: It was found on a screening mammogram.   Past/Anticipated interventions by surgeon, if any: 04/30/17 Procedure: Left breast seed localized lumpectomy with left axillary deep sentinel lymph node mapping with methylene blue dye AND PORT PLACEMENT with C arm and U/S guidance.  Surgeon: Erroll Luna M.D.    Past/Anticipated interventions by medical oncology, if any: 09/13/17 Dr. Lindi Adie: Current treatment:Completed 4 cyclesofdose dense Adriamycin and Cytoxan,today is cycle 6 Taxol being discontinued because of neuropathy. Toxicities: 1. Diarrhea 2. Persistent peripheral neuropathy: It has gotten significantly worse and hence we will stop chemo.  We decided to stop further chemo due to worsening neuropathy Return to clinic after radiation is complete.  Lymphedema issues, if any: She denies. She has good arm mobility.    Pain issues, if any: She reports pain to her hands and feet from peripheral neuropathy related to chemotherapy.   SAFETY ISSUES:  Prior radiation? No  Pacemaker/ICD? No  Possible current pregnancy? No  Is the patient on methotrexate? No   Current Complaints / other details:   She has questions about her PAC and possible removal during  radiation.   BP 132/71   Pulse 91   Temp 98.1 F (36.7 C)   Ht '5\' 5"'  (1.651 m)   Wt 203 lb (92.1 kg)   SpO2 100% Comment: room air  BMI 33.78 kg/m    Wt Readings from Last 3 Encounters:  09/23/17 203 lb (92.1 kg)  09/13/17 211 lb 12.8 oz (96.1 kg)  09/06/17 205 lb 14.4 oz (93.4 kg)      Mischell Branford, Stephani Police, RN 09/18/2017,8:19 AM

## 2017-09-23 ENCOUNTER — Encounter: Payer: Self-pay | Admitting: Radiation Oncology

## 2017-09-23 ENCOUNTER — Ambulatory Visit
Admission: RE | Admit: 2017-09-23 | Discharge: 2017-09-23 | Disposition: A | Discharge status: Home / Self Care | Payer: BC Managed Care – PPO | Source: Ambulatory Visit | Attending: Radiation Oncology | Admitting: Radiation Oncology

## 2017-09-23 VITALS — BP 132/71 | HR 91 | Temp 98.1°F | Ht 65.0 in | Wt 203.0 lb

## 2017-09-23 DIAGNOSIS — Z9221 Personal history of antineoplastic chemotherapy: Secondary | ICD-10-CM | POA: Diagnosis not present

## 2017-09-23 DIAGNOSIS — G629 Polyneuropathy, unspecified: Secondary | ICD-10-CM | POA: Diagnosis not present

## 2017-09-23 DIAGNOSIS — C50412 Malignant neoplasm of upper-outer quadrant of left female breast: Secondary | ICD-10-CM | POA: Insufficient documentation

## 2017-09-23 DIAGNOSIS — Z171 Estrogen receptor negative status [ER-]: Secondary | ICD-10-CM | POA: Insufficient documentation

## 2017-09-23 DIAGNOSIS — Z510 Encounter for antineoplastic radiation therapy: Secondary | ICD-10-CM | POA: Diagnosis present

## 2017-09-23 DIAGNOSIS — Z794 Long term (current) use of insulin: Secondary | ICD-10-CM | POA: Diagnosis not present

## 2017-09-23 DIAGNOSIS — R5383 Other fatigue: Secondary | ICD-10-CM | POA: Diagnosis not present

## 2017-09-23 DIAGNOSIS — Z79899 Other long term (current) drug therapy: Secondary | ICD-10-CM | POA: Insufficient documentation

## 2017-09-23 NOTE — Progress Notes (Signed)
Radiation Oncology         (336) [phone] ________________________________  Name: Audrey Peters MRN: [id number]  Date: 09/23/2017  DOB: January 18, 1956  SIMULATION AND TREATMENT PLANNING NOTE  Special treatment procedure     Outpatient  DIAGNOSIS:     ICD-10-CM   1. Malignant neoplasm of upper-outer quadrant of left breast in female, estrogen receptor negative ([place]) C50.412    Z17.1     NARRATIVE:  The patient was brought to the [facility].  Identity was confirmed.  All relevant records and images related to the planned course of therapy were reviewed.  The patient freely provided informed written consent to proceed with treatment after reviewing the details related to the planned course of therapy. The consent form was witnessed and verified by the simulation staff.    Then, the patient was set-up in a stable reproducible supine position for radiation therapy with her ipsilateral arm over her head, and her upper body secured in a custom-made Vac-lok device.  CT images were obtained.  Surface markings were placed.  The CT images were loaded into the planning software.    Special treatment procedure:  Special treatment procedure was performed today due to the extra time and effort required by myself to plan and prepare this patient for deep inspiration breath hold technique.  I have determined cardiac sparing to be of benefit to this patient to prevent long term cardiac damage due to radiation of the heart.  Bellows were placed on the patient's abdomen. To facilitate cardiac sparing, the patient was coached by the radiation therapists on breath hold techniques and breathing practice was performed. Practice waveforms were obtained. The patient was then scanned while maintaining breath hold in the treatment position.  This image was then transferred over to the imaging specialist. The imaging specialist then created a fusion of the free breathing and breath hold scans using the chest  wall as the stable structure. I personally reviewed the fusion in axial, coronal and sagittal image planes.  Excellent cardiac sparing was obtained.  I felt the patient is an appropriate candidate for breath hold and the patient will be treated as such.  The image fusion was then reviewed with the patient to reinforce the necessity of reproducible breath hold.   TREATMENT PLANNING NOTE: Treatment planning then occurred.  The radiation prescription was entered and confirmed.     A total of 3 medically necessary complex treatment devices were fabricated and supervised by me: 2 fields with MLCs for custom blocks to protect heart, and lungs;  and, a Vac-lok. MORE COMPLEX DEVICES MAY BE MADE IN DOSIMETRY FOR FIELD IN FIELD BEAMS FOR DOSE HOMOGENEITY.  I have requested : 3D Simulation which is medically necessary to give adequate dose to at risk tissues while sparing lungs and heart.  I have requested a DVH of the following structures: lungs, heart, lumpectomy cavity (left).    The patient will receive 40.05 Gy in 15 fractions to the left breast with 2 tangential fields.   This will be followed by a boost.  Optical Surface Tracking Plan:  Since intensity modulated radiotherapy (IMRT) and 3D conformal radiation treatment methods are predicated on accurate and precise positioning for treatment, intrafraction motion monitoring is medically necessary to ensure accurate and safe treatment delivery. The ability to quantify intrafraction motion without excessive ionizing radiation dose can only be performed with optical surface tracking. Accordingly, surface imaging offers the opportunity to obtain 3D measurements of patient position throughout IMRT and  3D treatments without excessive radiation exposure. I am ordering optical surface tracking for this patient's upcoming course of radiotherapy.  ________________________________   Reference:  Ursula Alert, J, et al. Surface imaging-based analysis of  intrafraction motion for breast radiotherapy patients.Journal of [place], n. 6, nov. 2014. ISSN [id number].  Available at: <http://www.jacmp.org/index.php/jacmp/article/view/4957>.    -----------------------------------  Eppie Gibson, MD

## 2017-09-24 ENCOUNTER — Encounter: Payer: Self-pay | Admitting: Radiation Oncology

## 2017-09-24 DIAGNOSIS — Z510 Encounter for antineoplastic radiation therapy: Secondary | ICD-10-CM | POA: Diagnosis not present

## 2017-09-24 NOTE — Progress Notes (Signed)
Radiation Oncology         [phone] ________________________________  Name: Audrey Peters MRN: [id number]  Date: 09/23/2017  DOB: March 27, 1956  Follow-Up Visit Note  Outpatient  CC: [name], MD  [name], MD  Diagnosis:      ICD-10-CM   1. Malignant neoplasm of upper-outer quadrant of left breast in female, estrogen receptor negative ([place]) C50.412    Z17.1    Cancer Staging Malignant neoplasm of upper-outer quadrant of left breast in female, estrogen receptor negative ([place]) Staging form: Breast, AJCC 8th Edition - Clinical: No stage assigned - Unsigned - Pathologic: Stage IB (pT1c, pN0, cM0, G3, ER: Negative, PR: Negative, HER2: Negative) - Signed by Eppie Gibson, MD on 05/14/2017   CHIEF COMPLAINT: Here to discuss management of left breast cancer  Narrative:  The patient returns today for follow-up.    To review, she underwent lumpectomy and SLN bx on 04-30-17 revealing a grade 3 1.7cm tumor (IDC) with negative margins by at least 38m and 1 benign node.    She completed adjuvant chemotherapy (dose dense AC-->Taxol) over three weeks ago.  This was discontinued due to neuropathy.     She reports good ROM in her arms.  No lymphedema.  + peripheral neuropathy.  Some fatigue from chemotherapy.    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           ALLERGIES:  is allergic to invokana [canagliflozin]; sulfa antibiotics; and ciprofloxacin.  Meds: Current Outpatient Medications  Medication Sig Dispense Refill  . acetaminophen (TYLENOL 8 HOUR ARTHRITIS PAIN) 650 MG CR tablet Take 650 mg by mouth every 8 (eight) hours as needed for pain.    . clotrimazole (LOTRIMIN) 1 % cream Apply 1 application topically daily as needed (for irritated/itchy skin.).     .Marland KitchenDulaglutide (TRULICITY) 1.5 MWG/9.5AOSOPN Inject 1.5 mg into the skin every Tuesday.    . gabapentin (NEURONTIN) 300 MG capsule Take 1 capsule (300 mg total) by mouth at bedtime. 30 capsule 3  . insulin regular human  CONCENTRATED (HUMULIN R U-500 KWIKPEN) 500 UNIT/ML kwikpen Inject 50-120 Units into the skin 3 (three) times daily with meals. 120 units in the morning, 50 units at lunch and 50 units at supper.    . losartan (COZAAR) 100 MG tablet Take 100 mg by mouth daily.    . magic mouthwash SOLN Take 5 mLs 4 (four) times daily as needed by mouth for mouth pain. 240 mL 0  . potassium chloride SA (K-DUR,KLOR-CON) 20 MEQ tablet Take 1 tablet (20 mEq total) by mouth daily. 30 tablet 0  . traMADol (ULTRAM) 50 MG tablet Take 1 tablet (50 mg total) by mouth every 6 (six) hours as needed. 30 tablet 0  . estradiol (ESTRACE) 0.1 MG/GM vaginal cream Place 1 Applicatorful vaginally 2 (two) times a week.    . metFORMIN (GLUCOPHAGE) 1000 MG tablet Take 1,000 mg by mouth daily.     No current facility-administered medications for this encounter.     Physical Findings:  height is '5\' 5"'  (1.651 m) and weight is 203 lb (92.1 kg). Her temperature is 98.1 F (36.7 C). Her blood pressure is 132/71 and her pulse is 91. Her oxygen saturation is 100%. .     General: Alert and oriented, in no acute distress Extremities: No upper extremity edema. Musculoskeletal: able to raise both arms over head without issues Psychiatric: Judgment and insight are intact. Affect is appropriate. Breast exam reveals well healed UOQ scar, left breast  Skin no concerning rashes over chest  Lab Findings: Lab Results  Component Value Date   WBC 3.1 (L) 09/13/2017   HGB 8.9 (L) 09/13/2017   HCT 27.4 (L) 09/13/2017   MCV 91.0 09/13/2017   PLT 204 09/13/2017     Radiographic Findings: No results found.  Impression/Plan: We discussed adjuvant radiotherapy today.  I recommend radiotherapy to the left breast in order to reduce risk of locoregional recurrence by 2/3.  The risks, benefits and side effects of this treatment were discussed in detail.  She understands that radiotherapy is associated with skin irritation and fatigue in the acute  setting. Late effects can include cosmetic changes and rare injury to internal organs.   She is enthusiastic about proceeding with treatment. A consent form has been signed and placed in her chart.  A total of 3 medically necessary complex treatment devices will be fabricated and supervised by me: 2 fields with MLCs for custom blocks to protect heart, and lungs;  and, a Vac-lok. MORE COMPLEX DEVICES MAY BE MADE IN DOSIMETRY FOR FIELD IN FIELD BEAMS FOR DOSE HOMOGENEITY.  I have requested : 3D Simulation which is medically necessary to give adequate dose to at risk tissues while sparing lungs and heart.  I have requested a DVH of the following structures: lungs, heart, left lumpectomy cavity.    The patient will receive 40.05 Gy in 15 fractions to the left breast with 2 fields.  This will be followed by a boost.  Simulation will be today, start RT this week to allow completion of RT for an importance conference she has in mid to late March that is very important to her.  I spent 20 minutes face to face with the patient and more than 50% of that time was spent in counseling and/or coordination of care. _____________________________________   Eppie Gibson, MD

## 2017-09-25 ENCOUNTER — Telehealth: Payer: Self-pay | Admitting: Hematology and Oncology

## 2017-09-25 ENCOUNTER — Ambulatory Visit
Admission: RE | Admit: 2017-09-25 | Discharge: 2017-09-25 | Disposition: A | Discharge status: Home / Self Care | Payer: BC Managed Care – PPO | Source: Ambulatory Visit | Attending: Radiation Oncology | Admitting: Radiation Oncology

## 2017-09-25 DIAGNOSIS — Z510 Encounter for antineoplastic radiation therapy: Secondary | ICD-10-CM | POA: Diagnosis not present

## 2017-09-25 NOTE — Telephone Encounter (Signed)
Mailed patient calendar of upcoming March appointments per 2/12 sch message.

## 2017-09-26 ENCOUNTER — Ambulatory Visit
Admission: RE | Admit: 2017-09-26 | Discharge: 2017-09-26 | Disposition: A | Discharge status: Home / Self Care | Payer: BC Managed Care – PPO | Source: Ambulatory Visit | Attending: Radiation Oncology | Admitting: Radiation Oncology

## 2017-09-26 ENCOUNTER — Inpatient Hospital Stay
Admission: RE | Admit: 2017-09-26 | Discharge: 2017-09-26 | Disposition: A | Discharge status: Home / Self Care | Payer: Self-pay | Source: Ambulatory Visit | Attending: Radiation Oncology | Admitting: Radiation Oncology

## 2017-09-26 DIAGNOSIS — Z171 Estrogen receptor negative status [ER-]: Principal | ICD-10-CM

## 2017-09-26 DIAGNOSIS — Z510 Encounter for antineoplastic radiation therapy: Secondary | ICD-10-CM | POA: Diagnosis not present

## 2017-09-26 DIAGNOSIS — C50412 Malignant neoplasm of upper-outer quadrant of left female breast: Secondary | ICD-10-CM

## 2017-09-26 MED ORDER — ALRA NON-METALLIC DEODORANT (RAD-ONC)
1.0000 "application " | Freq: Once | TOPICAL | Status: AC
  Administered 2017-09-26: 1 via TOPICAL

## 2017-09-26 MED ORDER — RADIAPLEXRX EX GEL
Freq: Once | CUTANEOUS | Status: AC
  Administered 2017-09-26: 11:00:00 via TOPICAL

## 2017-09-26 NOTE — Progress Notes (Signed)

## 2017-09-27 ENCOUNTER — Ambulatory Visit
Admission: RE | Admit: 2017-09-27 | Discharge: 2017-09-27 | Disposition: A | Discharge status: Home / Self Care | Payer: BC Managed Care – PPO | Source: Ambulatory Visit | Attending: Radiation Oncology | Admitting: Radiation Oncology

## 2017-09-27 DIAGNOSIS — Z510 Encounter for antineoplastic radiation therapy: Secondary | ICD-10-CM | POA: Diagnosis not present

## 2017-09-30 ENCOUNTER — Ambulatory Visit
Admission: RE | Admit: 2017-09-30 | Discharge: 2017-09-30 | Disposition: A | Discharge status: Home / Self Care | Payer: BC Managed Care – PPO | Source: Ambulatory Visit | Attending: Radiation Oncology | Admitting: Radiation Oncology

## 2017-09-30 DIAGNOSIS — Z510 Encounter for antineoplastic radiation therapy: Secondary | ICD-10-CM | POA: Diagnosis not present

## 2017-10-01 ENCOUNTER — Ambulatory Visit
Admission: RE | Admit: 2017-10-01 | Discharge: 2017-10-01 | Disposition: A | Discharge status: Home / Self Care | Payer: BC Managed Care – PPO | Source: Ambulatory Visit | Attending: Radiation Oncology | Admitting: Radiation Oncology

## 2017-10-01 DIAGNOSIS — Z510 Encounter for antineoplastic radiation therapy: Secondary | ICD-10-CM | POA: Diagnosis not present

## 2017-10-02 ENCOUNTER — Ambulatory Visit
Admission: RE | Admit: 2017-10-02 | Discharge: 2017-10-02 | Disposition: A | Discharge status: Home / Self Care | Payer: BC Managed Care – PPO | Source: Ambulatory Visit | Attending: Radiation Oncology | Admitting: Radiation Oncology

## 2017-10-02 DIAGNOSIS — Z510 Encounter for antineoplastic radiation therapy: Secondary | ICD-10-CM | POA: Diagnosis not present

## 2017-10-03 ENCOUNTER — Ambulatory Visit
Admission: RE | Admit: 2017-10-03 | Discharge: 2017-10-03 | Disposition: A | Discharge status: Home / Self Care | Payer: BC Managed Care – PPO | Source: Ambulatory Visit | Attending: Radiation Oncology | Admitting: Radiation Oncology

## 2017-10-03 DIAGNOSIS — Z510 Encounter for antineoplastic radiation therapy: Secondary | ICD-10-CM | POA: Diagnosis not present

## 2017-10-04 ENCOUNTER — Ambulatory Visit: Payer: BC Managed Care – PPO

## 2017-10-07 ENCOUNTER — Ambulatory Visit
Admission: RE | Admit: 2017-10-07 | Discharge: 2017-10-07 | Disposition: A | Discharge status: Home / Self Care | Payer: BC Managed Care – PPO | Source: Ambulatory Visit | Attending: Radiation Oncology | Admitting: Radiation Oncology

## 2017-10-07 DIAGNOSIS — Z510 Encounter for antineoplastic radiation therapy: Secondary | ICD-10-CM | POA: Diagnosis not present

## 2017-10-08 ENCOUNTER — Ambulatory Visit
Admission: RE | Admit: 2017-10-08 | Discharge: 2017-10-08 | Disposition: A | Discharge status: Home / Self Care | Payer: BC Managed Care – PPO | Source: Ambulatory Visit | Attending: Radiation Oncology | Admitting: Radiation Oncology

## 2017-10-08 DIAGNOSIS — Z510 Encounter for antineoplastic radiation therapy: Secondary | ICD-10-CM | POA: Diagnosis not present

## 2017-10-09 ENCOUNTER — Ambulatory Visit
Admission: RE | Admit: 2017-10-09 | Discharge: 2017-10-09 | Disposition: A | Discharge status: Home / Self Care | Payer: BC Managed Care – PPO | Source: Ambulatory Visit | Attending: Radiation Oncology | Admitting: Radiation Oncology

## 2017-10-09 DIAGNOSIS — Z510 Encounter for antineoplastic radiation therapy: Secondary | ICD-10-CM | POA: Diagnosis not present

## 2017-10-10 ENCOUNTER — Ambulatory Visit
Admission: RE | Admit: 2017-10-10 | Discharge: 2017-10-10 | Disposition: A | Discharge status: Home / Self Care | Payer: BC Managed Care – PPO | Source: Ambulatory Visit | Attending: Radiation Oncology | Admitting: Radiation Oncology

## 2017-10-10 DIAGNOSIS — Z510 Encounter for antineoplastic radiation therapy: Secondary | ICD-10-CM | POA: Diagnosis not present

## 2017-10-11 ENCOUNTER — Ambulatory Visit
Admission: RE | Admit: 2017-10-11 | Discharge: 2017-10-11 | Disposition: A | Discharge status: Home / Self Care | Payer: BC Managed Care – PPO | Source: Ambulatory Visit | Attending: Radiation Oncology | Admitting: Radiation Oncology

## 2017-10-11 DIAGNOSIS — C50412 Malignant neoplasm of upper-outer quadrant of left female breast: Secondary | ICD-10-CM | POA: Insufficient documentation

## 2017-10-11 DIAGNOSIS — Z171 Estrogen receptor negative status [ER-]: Secondary | ICD-10-CM | POA: Diagnosis not present

## 2017-10-11 DIAGNOSIS — Z510 Encounter for antineoplastic radiation therapy: Secondary | ICD-10-CM | POA: Insufficient documentation

## 2017-10-14 ENCOUNTER — Ambulatory Visit
Admission: RE | Admit: 2017-10-14 | Discharge: 2017-10-14 | Disposition: A | Discharge status: Home / Self Care | Payer: BC Managed Care – PPO | Source: Ambulatory Visit | Attending: Radiation Oncology | Admitting: Radiation Oncology

## 2017-10-14 ENCOUNTER — Ambulatory Visit: Payer: BC Managed Care – PPO | Admitting: Radiation Oncology

## 2017-10-14 DIAGNOSIS — C50412 Malignant neoplasm of upper-outer quadrant of left female breast: Secondary | ICD-10-CM

## 2017-10-14 DIAGNOSIS — Z171 Estrogen receptor negative status [ER-]: Principal | ICD-10-CM

## 2017-10-14 MED ORDER — RADIAPLEXRX EX GEL
Freq: Once | CUTANEOUS | Status: AC
  Administered 2017-10-14: 16:00:00 via TOPICAL

## 2017-10-15 ENCOUNTER — Ambulatory Visit
Admission: RE | Admit: 2017-10-15 | Discharge: 2017-10-15 | Disposition: A | Discharge status: Home / Self Care | Payer: BC Managed Care – PPO | Source: Ambulatory Visit | Attending: Radiation Oncology | Admitting: Radiation Oncology

## 2017-10-15 DIAGNOSIS — C50412 Malignant neoplasm of upper-outer quadrant of left female breast: Secondary | ICD-10-CM | POA: Diagnosis not present

## 2017-10-16 ENCOUNTER — Ambulatory Visit
Admission: RE | Admit: 2017-10-16 | Discharge: 2017-10-16 | Disposition: A | Discharge status: Home / Self Care | Payer: BC Managed Care – PPO | Source: Ambulatory Visit | Attending: Radiation Oncology | Admitting: Radiation Oncology

## 2017-10-16 DIAGNOSIS — C50412 Malignant neoplasm of upper-outer quadrant of left female breast: Secondary | ICD-10-CM | POA: Diagnosis not present

## 2017-10-17 ENCOUNTER — Ambulatory Visit
Admission: RE | Admit: 2017-10-17 | Discharge: 2017-10-17 | Disposition: A | Discharge status: Home / Self Care | Payer: BC Managed Care – PPO | Source: Ambulatory Visit | Attending: Radiation Oncology | Admitting: Radiation Oncology

## 2017-10-17 DIAGNOSIS — C50412 Malignant neoplasm of upper-outer quadrant of left female breast: Secondary | ICD-10-CM | POA: Diagnosis not present

## 2017-10-18 ENCOUNTER — Ambulatory Visit
Admission: RE | Admit: 2017-10-18 | Discharge: 2017-10-18 | Disposition: A | Discharge status: Home / Self Care | Payer: BC Managed Care – PPO | Source: Ambulatory Visit | Attending: Radiation Oncology | Admitting: Radiation Oncology

## 2017-10-18 DIAGNOSIS — C50412 Malignant neoplasm of upper-outer quadrant of left female breast: Secondary | ICD-10-CM | POA: Diagnosis not present

## 2017-10-21 ENCOUNTER — Ambulatory Visit
Admission: RE | Admit: 2017-10-21 | Discharge: 2017-10-21 | Disposition: A | Discharge status: Home / Self Care | Payer: BC Managed Care – PPO | Source: Ambulatory Visit | Attending: Radiation Oncology | Admitting: Radiation Oncology

## 2017-10-21 DIAGNOSIS — C50412 Malignant neoplasm of upper-outer quadrant of left female breast: Secondary | ICD-10-CM | POA: Diagnosis not present

## 2017-10-22 ENCOUNTER — Telehealth: Payer: Self-pay | Admitting: Hematology and Oncology

## 2017-10-22 ENCOUNTER — Ambulatory Visit
Admission: RE | Admit: 2017-10-22 | Discharge: 2017-10-22 | Disposition: A | Discharge status: Home / Self Care | Payer: BC Managed Care – PPO | Source: Ambulatory Visit | Attending: Radiation Oncology | Admitting: Radiation Oncology

## 2017-10-22 ENCOUNTER — Inpatient Hospital Stay: Payer: BC Managed Care – PPO | Attending: Hematology and Oncology | Admitting: Hematology and Oncology

## 2017-10-22 DIAGNOSIS — Z171 Estrogen receptor negative status [ER-]: Secondary | ICD-10-CM | POA: Diagnosis not present

## 2017-10-22 DIAGNOSIS — C50412 Malignant neoplasm of upper-outer quadrant of left female breast: Secondary | ICD-10-CM | POA: Diagnosis not present

## 2017-10-22 DIAGNOSIS — T451X5A Adverse effect of antineoplastic and immunosuppressive drugs, initial encounter: Secondary | ICD-10-CM | POA: Insufficient documentation

## 2017-10-22 DIAGNOSIS — G620 Drug-induced polyneuropathy: Secondary | ICD-10-CM | POA: Diagnosis not present

## 2017-10-22 NOTE — Assessment & Plan Note (Signed)
04/30/2017: Left lumpectomy: IDC grade 3, 1.7 cm, DCIS, lymphovascular invasion present, margins negative, 0/1 lymph node negative, ER 0%, PR 0%, HER-2 negative ratio 1.37, Ki-67 40%, T1c N0 stage IB  Recommendation: 1. adjuvant chemotherapy with dose dense Adriamycin and Cytoxan 4 followed by Taxol weekly 6 discontinued for neuropathy 2. Followed by radiation started 09/26/2017 --------------------------------------------------------------------------------------------------------------------------- Chemo-induced peripheral neuropathy  Our plan is to continue with surveillance since she completed her treatment Return to clinic in 6 months for follow-up and subsequently she will come back in 1 year

## 2017-10-22 NOTE — Progress Notes (Signed)
Patient Care Team: [name], MD as PCP - General (Internal Medicine)  DIAGNOSIS:  Encounter Diagnosis  Name Primary?  . Malignant neoplasm of upper-outer quadrant of left breast in female, estrogen receptor negative ([place])     SUMMARY OF ONCOLOGIC HISTORY:   Malignant neoplasm of upper-outer quadrant of left breast in female, estrogen receptor negative ([place])   04/05/2017 Initial Diagnosis    Left breast asymmetry by ultrasound measured 1.3 cm at 2:30 position 10 cm from nipple, no axillary lymph nodes; biopsy IDC grade 2, ER 0%, PR 0%, HER-2 negative ratio 1.37, Ki-67 40%, T1c N0 stage IB AJCC 8       04/30/2017 Surgery    Left lumpectomy: IDC grade 3, 1.7 cm, DCIS, lymphovascular invasion present, margins negative, 0/1 lymph node negative, ER 0%, PR 0%, HER-2 negative ratio 1.37, Ki-67 40%, T1c N0 stage IB      05/22/2017 Genetic Testing    Patient had genetic testing due to a personal history of triple negative breast cancer.  The Common Hereditary Cancer Panel was ordered. The Hereditary Gene Panel offered by [facility] includes sequencing and/or deletion duplication testing of the following 46 genes: APC, ATM, AXIN2, BARD1, BMPR1A, BRCA1, BRCA2, BRIP1, CDH1, CDKN2A (p14ARF), CDKN2A (p16INK4a), CHEK2, CTNNA1, DICER1, EPCAM (Deletion/duplication testing only), GREM1 (promoter region deletion/duplication testing only), KIT, MEN1, MLH1, MSH2, MSH3, MSH6, MUTYH, NBN, NF1, NHTL1, PALB2, PDGFRA, PMS2, POLD1, POLE, PTEN, RAD50, RAD51C, RAD51D, SDHB, SDHC, SDHD, SMAD4, SMARCA4. STK11, TP53, TSC1, TSC2, and VHL.  The following genes were evaluated for sequence changes only: SDHA and HOXB13 c.251G>A variant only.    Results: No pathogenic mutations identified.  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  The date of this test report is 05/22/2017.       05/24/2017 - 08/30/2017 Chemotherapy    Dose dense Adriamycin and Cytoxan 4 followed by Taxol weekly 5 (stopped early for  neuropathy)       09/26/2017 - 10/22/2017 Radiation Therapy    Adjuvant radiation therapy       CHIEF COMPLIANT: Follow-up towards the end of radiation  INTERVAL HISTORY: Audrey Peters is a 62-year-old with above-mentioned history of left breast cancer treated with lumpectomy followed by chemotherapy and she is about to finished radiation in the next couple of days.  Her major complaint is related to neuropathy in hands and feet.  This is making it difficult for her to button her shirt or to put her earrings.  REVIEW OF SYSTEMS:   Constitutional: Denies fevers, chills or abnormal weight loss Eyes: Denies blurriness of vision Ears, nose, mouth, throat, and face: Denies mucositis or sore throat Respiratory: Denies cough, dyspnea or wheezes Cardiovascular: Denies palpitation, chest discomfort Gastrointestinal:  Denies nausea, heartburn or change in bowel habits Skin: Denies abnormal skin rashes Lymphatics: Denies new lymphadenopathy or easy bruising Neurological: Severe neuropathy affecting her quality of life Behavioral/Psych: Mood is stable, no new changes  Extremities: No lower extremity edema  All other systems were reviewed with the patient and are negative.  I have reviewed the past medical history, past surgical history, social history and family history with the patient and they are unchanged from previous note.  ALLERGIES:  is allergic to invokana [canagliflozin]; sulfa antibiotics; and ciprofloxacin.  MEDICATIONS:  Current Outpatient Medications  Medication Sig Dispense Refill  . acetaminophen (TYLENOL 8 HOUR ARTHRITIS PAIN) 650 MG CR tablet Take 650 mg by mouth every 8 (eight) hours as needed for pain.    . clotrimazole (LOTRIMIN) 1 %  cream Apply 1 application topically daily as needed (for irritated/itchy skin.).     Marland Kitchen Dulaglutide (TRULICITY) 1.5 JG/8.1LX SOPN Inject 1.5 mg into the skin every Tuesday.    . estradiol (ESTRACE) 0.1 MG/GM vaginal cream Place 1  Applicatorful vaginally 2 (two) times a week.    . gabapentin (NEURONTIN) 300 MG capsule Take 1 capsule (300 mg total) by mouth at bedtime. 30 capsule 3  . insulin regular human CONCENTRATED (HUMULIN R U-500 KWIKPEN) 500 UNIT/ML kwikpen Inject 50-120 Units into the skin 3 (three) times daily with meals. 120 units in the morning, 50 units at lunch and 50 units at supper.    . losartan (COZAAR) 100 MG tablet Take 100 mg by mouth daily.    . magic mouthwash SOLN Take 5 mLs 4 (four) times daily as needed by mouth for mouth pain. 240 mL 0  . metFORMIN (GLUCOPHAGE) 1000 MG tablet Take 1,000 mg by mouth daily.    . potassium chloride SA (K-DUR,KLOR-CON) 20 MEQ tablet Take 1 tablet (20 mEq total) by mouth daily. 30 tablet 0  . traMADol (ULTRAM) 50 MG tablet Take 1 tablet (50 mg total) by mouth every 6 (six) hours as needed. 30 tablet 0   No current facility-administered medications for this visit.     PHYSICAL EXAMINATION: ECOG PERFORMANCE STATUS: 1 - Symptomatic but completely ambulatory  Vitals:   10/22/17 0914  BP: 118/83  Pulse: 76  Resp: 18  Temp: 97.8 F (36.6 C)  SpO2: 100%   Filed Weights   10/22/17 0914  Weight: 200 lb 4.8 oz (90.9 kg)    GENERAL:alert, no distress and comfortable SKIN: skin color, texture, turgor are normal, no rashes or significant lesions EYES: normal, Conjunctiva are pink and non-injected, sclera clear OROPHARYNX:no exudate, no erythema and lips, buccal mucosa, and tongue normal  NECK: supple, thyroid normal size, non-tender, without nodularity LYMPH:  no palpable lymphadenopathy in the cervical, axillary or inguinal LUNGS: clear to auscultation and percussion with normal breathing effort HEART: regular rate & rhythm and no murmurs and no lower extremity edema ABDOMEN:abdomen soft, non-tender and normal bowel sounds MUSCULOSKELETAL:no cyanosis of digits and no clubbing  NEURO: alert & oriented x 3 with fluent speech, grade 2-3 neuropathy EXTREMITIES: No  lower extremity edema  LABORATORY DATA:  I have reviewed the data as listed CMP Latest Ref Rng & Units 09/13/2017 09/06/2017 08/30/2017  Glucose 70 - 140 mg/dL 274(H) 247(H) 241(H)  BUN 7 - 26 mg/dL _0 Creatinine 0.60 - 1.10 mg/dL 0.87 0.90 0.97  Sodium 136 - 145 mmol/L 138 136 137  Potassium 3.5 - 5.1 mmol/L 4.3 3.1(L) 3.3  Chloride 98 - 109 mmol/L 107 101 103  CO2 22 - 29 mmol/L 21(L) 23 23  Calcium 8.4 - 10.4 mg/dL 8.8 8.9 8.9  Total Protein 6.4 - 8.3 g/dL 6.3(L) 6.9 7.1  Total Bilirubin 0.2 - 1.2 mg/dL 0.5 0.8 0.6  Alkaline Phos 40 - 150 U/L 71 75 74  AST 5 - 34 U/L 52(H) 78(H) 69(H)  ALT 0 - 55 U/L 33 44 40    Lab Results  Component Value Date   WBC 3.1 (L) 09/13/2017   HGB 8.9 (L) 09/13/2017   HCT 27.4 (L) 09/13/2017   MCV 91.0 09/13/2017   PLT 204 09/13/2017   NEUTROABS 1.8 09/13/2017    ASSESSMENT & PLAN:  Malignant neoplasm of upper-outer quadrant of left breast in female, estrogen receptor negative ([place]) 04/30/2017: Left lumpectomy: IDC grade 3, 1.7 cm,  DCIS, lymphovascular invasion present, margins negative, 0/1 lymph node negative, ER 0%, PR 0%, HER-2 negative ratio 1.37, Ki-67 40%, T1c N0 stage IB  Recommendation: 1. adjuvant chemotherapy with dose dense Adriamycin and Cytoxan 4 followed by Taxol weekly 6 discontinued for neuropathy 2. Followed by radiation started 09/26/2017 --------------------------------------------------------------------------------------------------------------------------- Chemo-induced peripheral neuropathy: Patient has not been able to button her shirt or do fine tasks.  I discussed with her that gabapentin could be taken 3 times a day however patient gets very drowsy after she takes it.  Because of this we will leave the dose alone at once a day at bedtime.  Our plan is to continue with surveillance since she completed her treatment Return to clinic in 6 months for follow-up and subsequently she will come back in 1 year  I  spent 25 minutes talking to the patient of which more than half was spent in counseling and coordination of care.  No orders of the defined types were placed in this encounter.  The patient has a good understanding of the overall plan. she agrees with it. she will call with any problems that may develop before the next visit here.   Harriette Ohara, MD 10/22/17

## 2017-10-22 NOTE — Telephone Encounter (Signed)
Gave avs and calendar for September  °

## 2017-10-23 ENCOUNTER — Ambulatory Visit
Admission: RE | Admit: 2017-10-23 | Discharge: 2017-10-23 | Disposition: A | Discharge status: Home / Self Care | Payer: BC Managed Care – PPO | Source: Ambulatory Visit | Attending: Radiation Oncology | Admitting: Radiation Oncology

## 2017-10-23 DIAGNOSIS — C50412 Malignant neoplasm of upper-outer quadrant of left female breast: Secondary | ICD-10-CM | POA: Diagnosis not present

## 2017-10-24 ENCOUNTER — Encounter: Payer: Self-pay | Admitting: Radiation Oncology

## 2017-10-24 ENCOUNTER — Ambulatory Visit
Admission: RE | Admit: 2017-10-24 | Discharge: 2017-10-24 | Disposition: A | Discharge status: Home / Self Care | Payer: BC Managed Care – PPO | Source: Ambulatory Visit | Attending: Radiation Oncology | Admitting: Radiation Oncology

## 2017-10-24 DIAGNOSIS — C50412 Malignant neoplasm of upper-outer quadrant of left female breast: Secondary | ICD-10-CM | POA: Diagnosis not present

## 2017-10-25 NOTE — Progress Notes (Signed)
  Radiation Oncology         [phone] ________________________________  Name: Audrey Peters MRN: [id number]  Date: 10/24/2017  DOB: [birth year]  End of Treatment Note  Diagnosis:    Cancer Staging Malignant neoplasm of upper-outer quadrant of left breast in female, estrogen receptor negative ([place]) Staging form: Breast, AJCC 8th Edition - Clinical: No stage assigned - Unsigned - Pathologic: Stage IB (pT1c, pN0, cM0, G3, ER: Negative, PR: Negative, HER2: Negative) - Signed by Eppie Gibson, MD on 05/14/2017     Indication for treatment:  Curative       Radiation treatment dates:   09/26/2017 - 10/24/2017  Site/dose:   1. Left Breast / 40.05 Gy in 15 fractions 2. Left Breast Boost / 10 Gy in 5 fractions  Beams/energy:   1. 3D / 15X, 10X Photon 2)photons / 15X, 10X Photon  Narrative: The patient tolerated radiation treatment relatively well.   She experienced mild fatigue and radiation-related skin changes. There was erythema, hyperpigmentation, and diffuse dry desquamation to her left breast, and she is using the Radiaplex gel as directed. She also reported tenderness, sensitivity, and sharp shooting in her left breast. She was advised to take ibuprofen PRN for the soreness.  Plan: The patient has completed radiation treatment. The patient will return to radiation oncology clinic for routine followup in one month. I advised them to call or return sooner if they have any questions or concerns related to their recovery or treatment.  -----------------------------------  Eppie Gibson, MD  This document serves as a record of services personally performed by Eppie Gibson, MD. It was created on her behalf by Rae Lips, a trained medical scribe. The creation of this record is based on the scribe's personal observations and the provider's statements to them. This document has been checked and approved by the attending provider.

## 2017-10-29 ENCOUNTER — Telehealth: Payer: Self-pay | Admitting: Adult Health

## 2017-10-29 NOTE — Telephone Encounter (Signed)
Called patient regarding 6/14

## 2017-12-04 ENCOUNTER — Ambulatory Visit
Admission: RE | Admit: 2017-12-04 | Discharge: 2017-12-04 | Disposition: A | Discharge status: Home / Self Care | Payer: BC Managed Care – PPO | Source: Ambulatory Visit | Attending: Radiation Oncology | Admitting: Radiation Oncology

## 2017-12-04 ENCOUNTER — Other Ambulatory Visit: Payer: Self-pay

## 2017-12-04 ENCOUNTER — Encounter: Payer: Self-pay | Admitting: Radiation Oncology

## 2017-12-04 VITALS — BP 134/73 | HR 85 | Temp 98.1°F | Resp 18 | Ht 65.0 in | Wt 205.6 lb

## 2017-12-04 DIAGNOSIS — Z794 Long term (current) use of insulin: Secondary | ICD-10-CM | POA: Insufficient documentation

## 2017-12-04 DIAGNOSIS — Z79899 Other long term (current) drug therapy: Secondary | ICD-10-CM | POA: Insufficient documentation

## 2017-12-04 DIAGNOSIS — R5382 Chronic fatigue, unspecified: Secondary | ICD-10-CM

## 2017-12-04 DIAGNOSIS — Z171 Estrogen receptor negative status [ER-]: Secondary | ICD-10-CM | POA: Diagnosis not present

## 2017-12-04 DIAGNOSIS — N644 Mastodynia: Secondary | ICD-10-CM | POA: Diagnosis not present

## 2017-12-04 DIAGNOSIS — G629 Polyneuropathy, unspecified: Secondary | ICD-10-CM | POA: Insufficient documentation

## 2017-12-04 DIAGNOSIS — Z923 Personal history of irradiation: Secondary | ICD-10-CM | POA: Insufficient documentation

## 2017-12-04 DIAGNOSIS — C50412 Malignant neoplasm of upper-outer quadrant of left female breast: Secondary | ICD-10-CM | POA: Insufficient documentation

## 2017-12-04 LAB — TSH: TSH: 2.246 u[IU]/mL (ref 0.308–3.960)

## 2017-12-04 NOTE — Progress Notes (Signed)
Radiation Oncology         [phone] ________________________________  Name: Audrey Peters MRN: [id number]  Date: 12/04/2017  DOB: 21-Jun-1956  Follow-Up Visit Note  outpatient  CC: Audrey Carol, MD  Audrey Lose, MD  Diagnosis and Prior Radiotherapy:    ICD-10-CM   1. Chronic fatigue R53.82 TSH  2. Malignant neoplasm of upper-outer quadrant of left breast in female, estrogen receptor negative ([place]) C50.412    Z17.1     CHIEF COMPLAINT: Here for follow-up and surveillance of left breast cancer  Narrative:  The patient returns today for routine follow-up 1 month status post radiation therapy.  She tolerated treatment well. She noticed some fatigue and pain to her bilateral toes and fingers from peripheral neuropathy. Occasionally she has soreness and sharp pain to her left breast. Audrey Peters endorses mild skin changes and has used Radiaplex as advised. She denies any other symptoms or complaints at this time.                           ALLERGIES:  is allergic to invokana [canagliflozin]; sulfa antibiotics; and ciprofloxacin.  Meds: Current Outpatient Medications  Medication Sig Dispense Refill  . acetaminophen (TYLENOL 8 HOUR ARTHRITIS PAIN) 650 MG CR tablet Take 650 mg by mouth every 8 (eight) hours as needed for pain.    . clotrimazole (LOTRIMIN) 1 % cream Apply 1 application topically daily as needed (for irritated/itchy skin.).     Marland Kitchen gabapentin (NEURONTIN) 300 MG capsule Take 1 capsule (300 mg total) by mouth at bedtime. 30 capsule 3  . insulin regular human CONCENTRATED (HUMULIN R U-500 KWIKPEN) 500 UNIT/ML kwikpen Inject 50-120 Units into the skin 3 (three) times daily with meals. 120 units in the morning, 50 units at lunch and 50 units at supper.    . losartan (COZAAR) 100 MG tablet Take 100 mg by mouth daily.    . magic mouthwash SOLN Take 5 mLs 4 (four) times daily as needed by mouth for mouth pain. (Patient not taking: Reported on 12/04/2017) 240 mL 0  . potassium chloride  SA (K-DUR,KLOR-CON) 20 MEQ tablet Take 1 tablet (20 mEq total) by mouth daily. (Patient not taking: Reported on 12/04/2017) 30 tablet 0  . traMADol (ULTRAM) 50 MG tablet Take 1 tablet (50 mg total) by mouth every 6 (six) hours as needed. (Patient not taking: Reported on 12/04/2017) 30 tablet 0   No current facility-administered medications for this encounter.     Physical Findings: The patient is in no acute distress. Patient is alert and oriented.  height is 5\' 5"  (1.651 m) and weight is 205 lb 9.6 oz (93.3 kg). Her temperature is 98.1 F (36.7 C). Her blood pressure is 134/73 and her pulse is 85. Her respiration is 18 and oxygen saturation is 100%. .     General: Alert and oriented, in no acute distress Breast: left breast skin is mildly hyperpigmented, overall with excellent healing. Psychiatric: Judgment and insight are intact. Affect is appropriate.   Lab Findings: Lab Results  Component Value Date   WBC 3.1 (L) 09/13/2017   HGB 8.9 (L) 09/13/2017   HCT 27.4 (L) 09/13/2017   MCV 91.0 09/13/2017   PLT 204 09/13/2017   Lab Results  Component Value Date   TSH 2.246 12/04/2017    Radiographic Findings: No results found.  Impression/Plan:  Healing well from radiotherapy to the breast tissue.  Continue skin care with topical Vitamin E Oil and /  or lotion for at least 2 more months for further healing.  I encouraged her to continue with yearly mammography as appropriate (for intact breast tissue) and followup with medical oncology. I will see her back on an as-needed basis. I have encouraged her to call if she has any issues or concerns in the future. I wished her the very best.     Audrey Gibson, MD  This document serves as a record of services personally performed by Audrey Gibson, MD. It was created on her behalf by Audrey Peters, a trained medical scribe. The creation of this record is based on the scribe's personal observations and the provider's statements to them. This  document has been checked and approved by the attending provider.

## 2017-12-04 NOTE — Progress Notes (Signed)
Audrey Peters presents for follow up of radiation completed 10/24/17 to her Left Breast. She reports continued fatigue. She has pain to her bilateral toes and fingers related to peripheral neuropathy. She has some soreness and occasional sharp pains to her Left Breast. She has hyperpigmentation to her left breast. She has completed radiaplex and will begin using a vitamin e based lotion or oil. She will see survivorship on 01/24/18, and Dr. Lindi Adie next on 04/24/18.  BP 134/73   Pulse 85   Temp 98.1 F (36.7 C)   Resp 18   Ht 5\' 5"  (1.651 m)   Wt 205 lb 9.6 oz (93.3 kg)   SpO2 100% Comment: room air  BMI 34.21 kg/m    Wt Readings from Last 3 Encounters:  12/04/17 205 lb 9.6 oz (93.3 kg)  10/22/17 200 lb 4.8 oz (90.9 kg)  09/23/17 203 lb (92.1 kg)

## 2017-12-27 ENCOUNTER — Other Ambulatory Visit: Payer: Self-pay

## 2017-12-27 MED ORDER — GABAPENTIN 300 MG PO CAPS: 300.0000 mg | ORAL_CAPSULE | Freq: Three times a day (TID) | ORAL | 0 refills | Status: DC

## 2017-12-27 NOTE — Telephone Encounter (Signed)
Returned pt call regarding increased peripheral neuropathy. Per Dr. Lindi Adie she is to increase her gabapentin from bedtime to 3 times a day. Explained to her once in the am, lunch time, and bedtime. She is to call in two weeks if this doesn't help decrease her symptoms and we can discuss what to do further from there. She has good understanding.  Cyndia Bent RN

## 2018-01-22 ENCOUNTER — Encounter: Payer: Self-pay | Admitting: Adult Health

## 2018-01-22 DIAGNOSIS — T451X5A Adverse effect of antineoplastic and immunosuppressive drugs, initial encounter: Secondary | ICD-10-CM

## 2018-01-22 DIAGNOSIS — G620 Drug-induced polyneuropathy: Secondary | ICD-10-CM | POA: Insufficient documentation

## 2018-01-24 ENCOUNTER — Inpatient Hospital Stay: Payer: BC Managed Care – PPO | Attending: Hematology and Oncology | Admitting: Adult Health

## 2018-01-24 ENCOUNTER — Encounter: Payer: Self-pay | Admitting: Adult Health

## 2018-01-24 VITALS — BP 105/60 | HR 90 | Temp 97.9°F | Resp 18 | Ht 65.0 in | Wt 212.6 lb

## 2018-01-24 DIAGNOSIS — Z923 Personal history of irradiation: Secondary | ICD-10-CM | POA: Diagnosis not present

## 2018-01-24 DIAGNOSIS — T451X5A Adverse effect of antineoplastic and immunosuppressive drugs, initial encounter: Secondary | ICD-10-CM | POA: Insufficient documentation

## 2018-01-24 DIAGNOSIS — Z171 Estrogen receptor negative status [ER-]: Secondary | ICD-10-CM | POA: Insufficient documentation

## 2018-01-24 DIAGNOSIS — Z9221 Personal history of antineoplastic chemotherapy: Secondary | ICD-10-CM | POA: Insufficient documentation

## 2018-01-24 DIAGNOSIS — G620 Drug-induced polyneuropathy: Secondary | ICD-10-CM | POA: Diagnosis not present

## 2018-01-24 DIAGNOSIS — C50412 Malignant neoplasm of upper-outer quadrant of left female breast: Secondary | ICD-10-CM

## 2018-01-24 DIAGNOSIS — Z95828 Presence of other vascular implants and grafts: Secondary | ICD-10-CM

## 2018-01-24 DIAGNOSIS — E119 Type 2 diabetes mellitus without complications: Secondary | ICD-10-CM | POA: Insufficient documentation

## 2018-01-24 DIAGNOSIS — I10 Essential (primary) hypertension: Secondary | ICD-10-CM | POA: Insufficient documentation

## 2018-01-24 MED ORDER — SODIUM CHLORIDE 0.9% FLUSH
10.0000 mL | INTRAVENOUS | Status: DC | PRN
  Administered 2018-01-24: 10 mL via INTRAVENOUS
  Filled 2018-01-24: qty 10

## 2018-01-24 MED ORDER — HEPARIN SOD (PORK) LOCK FLUSH 100 UNIT/ML IV SOLN
500.0000 [IU] | Freq: Once | INTRAVENOUS | Status: AC | PRN
  Administered 2018-01-24: 500 [IU] via INTRAVENOUS
  Filled 2018-01-24: qty 5

## 2018-01-24 NOTE — Progress Notes (Signed)
CLINIC:  Survivorship   REASON FOR VISIT:  Routine follow-up post-treatment for a recent history of breast cancer.  BRIEF ONCOLOGIC HISTORY:    Malignant neoplasm of upper-outer quadrant of left breast in female, estrogen receptor negative ([place])   04/05/2017 Initial Diagnosis    Left breast asymmetry by ultrasound measured 1.3 cm at 2:30 position 10 cm from nipple, no axillary lymph nodes; biopsy IDC grade 2, ER 0%, PR 0%, HER-2 negative ratio 1.37, Ki-67 40%, T1c N0 stage IB AJCC 8       04/30/2017 Surgery    Left lumpectomy: IDC grade 3, 1.7 cm, DCIS, lymphovascular invasion present, margins negative, 0/1 lymph node negative, ER 0%, PR 0%, HER-2 negative ratio 1.37, Ki-67 40%, T1c N0 stage IB      05/22/2017 Genetic Testing    Patient had genetic testing due to a personal history of triple negative breast cancer.  The Common Hereditary Cancer Panel was ordered. The Hereditary Gene Panel offered by [facility] includes sequencing and/or deletion duplication testing of the following 46 genes: APC, ATM, AXIN2, BARD1, BMPR1A, BRCA1, BRCA2, BRIP1, CDH1, CDKN2A (p14ARF), CDKN2A (p16INK4a), CHEK2, CTNNA1, DICER1, EPCAM (Deletion/duplication testing only), GREM1 (promoter region deletion/duplication testing only), KIT, MEN1, MLH1, MSH2, MSH3, MSH6, MUTYH, NBN, NF1, NHTL1, PALB2, PDGFRA, PMS2, POLD1, POLE, PTEN, RAD50, RAD51C, RAD51D, SDHB, SDHC, SDHD, SMAD4, SMARCA4. STK11, TP53, TSC1, TSC2, and VHL.  The following genes were evaluated for sequence changes only: SDHA and HOXB13 c.251G>A variant only.    Results: No pathogenic mutations identified.  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  The date of this test report is 05/22/2017.       05/24/2017 - 08/30/2017 Chemotherapy    Dose dense Adriamycin and Cytoxan 4 followed by Taxol weekly 5 (stopped early for neuropathy)       09/26/2017 - 10/22/2017 Radiation Therapy    Adjuvant radiation therapy       INTERVAL HISTORY:  Ms.  Peters presents to the [facility] today for our initial meeting to review her survivorship care plan detailing her treatment course for breast cancer, as well as monitoring long-term side effects of that treatment, education regarding health maintenance, screening, and overall wellness and health promotion.     Overall, Audrey Peters is doing well today. 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  She notes the entire soles of her feet feel funny.  It is worse at night.  She notes occasional balance changes.  She also notes difficulty with putting on earrings.    Naylea tells me that she got sick about a week or two ago.  She left a urine sample and blood was noted in the urine.  She was sent to urology.  She is currently being worked up for kidney stones.    REVIEW OF SYSTEMS:  Review of Systems  Constitutional: Negative for appetite change, chills, fatigue, fever and unexpected weight change.  HENT:   Negative for hearing loss and lump/mass.   Eyes: Negative for eye problems and icterus.  Respiratory: Negative for chest tightness, cough and shortness of breath.   Cardiovascular: Negative for chest pain, leg swelling and palpitations.  Gastrointestinal: Negative for abdominal distention, abdominal pain, constipation, diarrhea, nausea and vomiting.  Endocrine: Negative for hot flashes.  Skin: Negative for itching and rash.  Neurological: Positive for numbness. Negative for dizziness, extremity weakness and headaches.  Psychiatric/Behavioral: Negative for depression. The patient is not nervous/anxious.   Breast: Denies any  new nodularity, masses, tenderness, nipple changes, or nipple discharge.      ONCOLOGY TREATMENT TEAM:  1. Surgeon:  Dr. Brantley Stage at [facility] 2. Medical Oncologist: Dr. Lindi Adie 3. Radiation Oncologist: Dr. Isidore Moos    PAST MEDICAL/SURGICAL HISTORY:  Past Medical History:  Diagnosis Date  .  Anemia yrs ago  . Arthritis   . Breast cancer ([place])   . Cancer [facility])    recent dx in breast  . Carpal tunnel syndrome of right wrist   . Diabetes mellitus without complication ([place])    dx 2008  . Headache    sinus  . Hypertension   . Personal history of chemotherapy   . Sleep apnea    does not use cpap  . Vaginal delivery 1983   Past Surgical History:  Procedure Laterality Date  . BREAST LUMPECTOMY Left   . BREAST LUMPECTOMY WITH RADIOACTIVE SEED AND SENTINEL LYMPH NODE BIOPSY Left 04/30/2017   Procedure: LEFT BREAST LUMPECTOMY WITH RADIOACTIVE SEED AND LEFT SENTINEL LYMPH NODE BIOPSY ERAS PATHWAY;  Surgeon: Erroll Luna, MD;  Location: [facility];  Service: General;  Laterality: Left;  . COLONOSCOPY WITH PROPOFOL N/A 05/28/2016   Procedure: COLONOSCOPY WITH PROPOFOL;  Surgeon: Garlan Fair, MD;  Location: [facility];  Service: Endoscopy;  Laterality: N/A;  . DILATION AND CURETTAGE OF UTERUS    . HYSTEROSCOPY W/D&C N/A 07/21/2015   Procedure: DILATATION AND CURETTAGE /HYSTEROSCOPY with myosure;  Surgeon: Janyth Pupa, DO;  Location: [facility];  Service: Gynecology;  Laterality: N/A;  . PORTACATH PLACEMENT Right 04/30/2017   Procedure: INSERTION PORT-A-CATH;  Surgeon: Erroll Luna, MD;  Location: [place];  Service: General;  Laterality: Right;     ALLERGIES:  Allergies  Allergen Reactions  . Invokana [Canagliflozin] Other (See Comments)    Caused a yeast infection  . Sulfa Antibiotics Itching and Other (See Comments)    Unsure of exact reaction type  . Ciprofloxacin Palpitations     CURRENT MEDICATIONS:  Outpatient Encounter Medications as of 01/24/2018  Medication Sig  . acetaminophen (TYLENOL 8 HOUR ARTHRITIS PAIN) 650 MG CR tablet Take 650 mg by mouth every 8 (eight) hours as needed for pain.  . clotrimazole (LOTRIMIN) 1 % cream Apply 1 application topically daily as needed (for irritated/itchy skin.).   Marland Kitchen gabapentin (NEURONTIN) 300 MG capsule Take 1 capsule (300 mg  total) by mouth 3 (three) times daily.  . insulin regular human CONCENTRATED (HUMULIN R U-500 KWIKPEN) 500 UNIT/ML kwikpen Inject 50-120 Units into the skin 3 (three) times daily with meals. 120 units in the morning, 50 units at lunch and 50 units at supper.  . losartan (COZAAR) 100 MG tablet Take 100 mg by mouth daily.  . magic mouthwash SOLN Take 5 mLs 4 (four) times daily as needed by mouth for mouth pain.  . traMADol (ULTRAM) 50 MG tablet Take 1 tablet (50 mg total) by mouth every 6 (six) hours as needed.  . potassium chloride SA (K-DUR,KLOR-CON) 20 MEQ tablet Take 1 tablet (20 mEq total) by mouth daily. (Patient not taking: Reported on 12/04/2017)  . [DISCONTINUED] prochlorperazine (COMPAZINE) 10 MG tablet Take 1 tablet (10 mg total) by mouth every 6 (six) hours as needed (Nausea or vomiting).   No facility-administered encounter medications on file as of 01/24/2018.      ONCOLOGIC FAMILY HISTORY:  Family History  Problem Relation Age of Onset  . Diabetes Mother   . Hypertension Mother      GENETIC COUNSELING/TESTING: See above  SOCIAL  HISTORY:  Social History   Socioeconomic History  . Marital status: Married    Spouse name: Not on file  . Number of children: Not on file  . Years of education: Not on file  . Highest education level: Not on file  Occupational History  . Not on file  Social Needs  . Financial resource strain: Not on file  . Food insecurity:    Worry: Not on file    Inability: Not on file  . Transportation needs:    Medical: Not on file    Non-medical: Not on file  Tobacco Use  . Smoking status: Never Smoker  . Smokeless tobacco: Never Used  Substance and Sexual Activity  . Alcohol use: Yes    Alcohol/week: 0.0 oz    Comment: occ wine  . Drug use: No  . Sexual activity: Not on file  Lifestyle  . Physical activity:    Days per week: Not on file    Minutes per session: Not on file  . Stress: Not on file  Relationships  . Social connections:     Talks on phone: Not on file    Gets together: Not on file    Attends religious service: Not on file    Active member of club or organization: Not on file    Attends meetings of clubs or organizations: Not on file    Relationship status: Not on file  . Intimate partner violence:    Fear of current or ex partner: Not on file    Emotionally abused: Not on file    Physically abused: Not on file    Forced sexual activity: Not on file  Other Topics Concern  . Not on file  Social History Narrative  . Not on file      PHYSICAL EXAMINATION:  Vital Signs:   Vitals:   01/24/18 1515  BP: 105/60  Pulse: 90  Resp: 18  Temp: 97.9 F (36.6 C)  SpO2: 98%   Filed Weights   01/24/18 1515  Weight: 212 lb 9.6 oz (96.4 kg)   General: Well-nourished, well-appearing female in no acute distress.  She is unaccompanied today.   HEENT: Head is normocephalic.  Pupils equal and reactive to light. Conjunctivae clear without exudate.  Sclerae anicteric. Oral mucosa is pink, moist.  Oropharynx is pink without lesions or erythema.  Lymph: No cervical, supraclavicular, or infraclavicular lymphadenopathy noted on palpation.  Cardiovascular: Regular rate and rhythm.Marland Kitchen Respiratory: Clear to auscultation bilaterally. Chest expansion symmetric; breathing non-labored.  Breasts: left breast s/p lumpectomy, no nodules, masses noted, right breast without nodules, masses, skin or nipple changes GI: Abdomen soft and round; non-tender, non-distended. Bowel sounds normoactive.  GU: Deferred.  Neuro: No focal deficits. Steady gait.  Psych: Mood and affect normal and appropriate for situation.  Extremities: No edema. MSK: No focal spinal tenderness to palpation.  Full range of motion in bilateral upper extremities Skin: Warm and dry.  LABORATORY DATA:  None for this visit.  DIAGNOSTIC IMAGING:  None for this visit.      ASSESSMENT AND PLAN:  Ms.. Peters is a pleasant 62 y.o. female with Stage IB left breast  invasive ductal carcinoma, ER-/PR-/HER2-, diagnosed in 03/2017, treated with lumpectomy, adjuvant chemotherapy, and adjuvant radiation therapy.  She presents to the Survivorship Clinic for our initial meeting and routine follow-up post-completion of treatment for breast cancer.    1. Stage IB left breast cancer:  Audrey Peters is continuing to recover from definitive treatment for breast cancer.  She will follow-up with her medical oncologist, Dr. Lindi Adie in 3 months with history and physical exam per surveillance protocol. Today, a comprehensive survivorship care plan and treatment summary was reviewed with the patient today detailing her breast cancer diagnosis, treatment course, potential late/long-term effects of treatment, appropriate follow-up care with recommendations for the future, and patient education resources.  A copy of this summary, along with a letter will be sent to the patient's primary care provider via mail/fax/In Basket message after today's visit.    2. Chemotherapy induced peripheral neuropathy: She will continue on Gabapetin. Right now she is taking 349m PO BID.  She declines a referral to PT at this time.  3. Bone health:  Given Audrey Peters/history of breast cancer, she is at risk for bone demineralization.  Her last DEXA scan was several years ago and she tells me it was normal. I will defer to her PCP regarding future bone density testing and management.  In the meantime, she was encouraged to increase her consumption of foods rich in calcium, as well as increase her weight-bearing activities.  She was given education on specific activities to promote bone health.  4. Cancer screening:  Due to Ms. JMattohistory and her age, she should receive screening for skin cancers, colon cancer, and gynecologic cancers.  The information and recommendations are listed on the patient's comprehensive care plan/treatment summary and were reviewed in detail with the patient.    5. Health  maintenance and wellness promotion: Ms. JMabeywas encouraged to consume 5-7 servings of fruits and vegetables per day. We reviewed the "Nutrition Rainbow" handout, as well as the handout "Take Control of Your Health and Reduce Your Cancer Risk" from the [employer]  She was also encouraged to engage in moderate to vigorous exercise for 30 minutes per day most days of the week. We discussed the [employer] fitness program, which is designed for cancer survivors to help them become more physically fit after cancer treatments.  She was instructed to limit her alcohol consumption and continue to abstain from tobacco use.     6. Support services/counseling: It is not uncommon for this period of the patient's cancer care trajectory to be one of many emotions and stressors.  We discussed an opportunity for her to participate in the next session of [facility]Finding Your New Normal") support group series designed for patients after they have completed treatment.   Ms. JTwomblywas encouraged to take advantage of our many other support services programs, support groups, and/or counseling in coping with her new life as a cancer survivor after completing anti-cancer treatment.  She was given information regarding our available services and encouraged to contact me with any questions or for help enrolling in any of our support group/programs.    Dispo:   -Return to cancer center for follow up with Dr. GLindi Adiein 3 months  -Mammogram due in 04/2018 -Bone density per PCP -She is welcome to return back to the Survivorship Clinic at any time; no additional follow-up needed at this time.  -Consider referral back to survivorship as a long-term survivor for continued surveillance  A total of (30) minutes of face-to-face time was spent with this patient with greater than 50% of that time in counseling and care-coordination.   LGardenia Phlegm NP Survivorship Program [phone]  Note: PRIMARY CARE PROVIDER PSeward Carol [phone]

## 2018-01-24 NOTE — Patient Instructions (Signed)

## 2018-01-27 ENCOUNTER — Telehealth: Payer: Self-pay | Admitting: Adult Health

## 2018-01-27 NOTE — Telephone Encounter (Signed)
No 6/14 los °

## 2018-02-17 ENCOUNTER — Other Ambulatory Visit: Payer: Self-pay | Admitting: Hematology and Oncology

## 2018-04-18 ENCOUNTER — Ambulatory Visit
Admission: RE | Admit: 2018-04-18 | Discharge: 2018-04-18 | Disposition: A | Discharge status: Home / Self Care | Payer: BC Managed Care – PPO | Source: Ambulatory Visit | Attending: Adult Health | Admitting: Adult Health

## 2018-04-18 DIAGNOSIS — C50412 Malignant neoplasm of upper-outer quadrant of left female breast: Secondary | ICD-10-CM

## 2018-04-18 DIAGNOSIS — Z171 Estrogen receptor negative status [ER-]: Principal | ICD-10-CM

## 2018-04-18 HISTORY — DX: Personal history of irradiation: Z92.3

## 2018-04-24 ENCOUNTER — Inpatient Hospital Stay: Payer: BC Managed Care – PPO | Attending: Hematology and Oncology | Admitting: Hematology and Oncology

## 2018-04-24 ENCOUNTER — Telehealth: Payer: Self-pay | Admitting: Hematology and Oncology

## 2018-04-24 DIAGNOSIS — R530 Neoplastic (malignant) related fatigue: Secondary | ICD-10-CM

## 2018-04-24 DIAGNOSIS — Z9221 Personal history of antineoplastic chemotherapy: Secondary | ICD-10-CM

## 2018-04-24 DIAGNOSIS — Z171 Estrogen receptor negative status [ER-]: Secondary | ICD-10-CM | POA: Diagnosis not present

## 2018-04-24 DIAGNOSIS — Z923 Personal history of irradiation: Secondary | ICD-10-CM | POA: Diagnosis not present

## 2018-04-24 DIAGNOSIS — T451X5A Adverse effect of antineoplastic and immunosuppressive drugs, initial encounter: Secondary | ICD-10-CM

## 2018-04-24 DIAGNOSIS — C50412 Malignant neoplasm of upper-outer quadrant of left female breast: Secondary | ICD-10-CM

## 2018-04-24 DIAGNOSIS — G620 Drug-induced polyneuropathy: Secondary | ICD-10-CM

## 2018-04-24 MED ORDER — DULOXETINE HCL 20 MG PO CPEP: 20.0000 mg | ORAL_CAPSULE | Freq: Every day | ORAL | 3 refills | Status: DC

## 2018-04-24 NOTE — Assessment & Plan Note (Signed)
04/30/2017: Left lumpectomy: IDC grade 3, 1.7 cm, DCIS, lymphovascular invasion present, margins negative, 0/1 lymph node negative, ER 0%, PR 0%, HER-2 negative ratio 1.37, Ki-67 40%, T1c N0 stage IB  Recommendation: 1. adjuvant chemotherapy with dose dense Adriamycin and Cytoxan 4 followed by Taxol weekly 6 discontinued for neuropathy 2. Followed by radiation started 09/26/2017 --------------------------------------------------------------------------------------------------------------------------- Chemo-induced peripheral neuropathy: Currently on gabapentin at bedtime.  Breast cancer surveillance: 1.  Breast exam June 2019: Benign 2. mammogram 04/18/2018: Benign postlumpectomy radiation changes left breast, breast density category B  Return to clinic in 6 months for follow-up after that we can see her once a year

## 2018-04-24 NOTE — Telephone Encounter (Signed)
Gave patient avs and calendar.   °

## 2018-04-24 NOTE — Progress Notes (Signed)
Patient Care Team: [name], MD as PCP - General (Internal Medicine) [name], MD as Consulting Physician (Hematology and Oncology) Eppie Gibson, MD as Attending Physician (Radiation Oncology) Erroll Luna, MD as Consulting Physician (General Surgery) Delice Bison Charlestine Massed, NP as Nurse Practitioner (Hematology and Oncology)  DIAGNOSIS:  Encounter Diagnosis  Name Primary?  . Malignant neoplasm of upper-outer quadrant of left breast in female, estrogen receptor negative ([place])     SUMMARY OF ONCOLOGIC HISTORY:   Malignant neoplasm of upper-outer quadrant of left breast in female, estrogen receptor negative ([place])   04/05/2017 Initial Diagnosis    Left breast asymmetry by ultrasound measured 1.3 cm at 2:30 position 10 cm from nipple, no axillary lymph nodes; biopsy IDC grade 2, ER 0%, PR 0%, HER-2 negative ratio 1.37, Ki-67 40%, T1c N0 stage IB AJCC 8     04/30/2017 Surgery    Left lumpectomy: IDC grade 3, 1.7 cm, DCIS, lymphovascular invasion present, margins negative, 0/1 lymph node negative, ER 0%, PR 0%, HER-2 negative ratio 1.37, Ki-67 40%, T1c N0 stage IB    05/22/2017 Genetic Testing    Patient had genetic testing due to a personal history of triple negative breast cancer.  The Common Hereditary Cancer Panel was ordered. The Hereditary Gene Panel offered by [facility] includes sequencing and/or deletion duplication testing of the following 46 genes: APC, ATM, AXIN2, BARD1, BMPR1A, BRCA1, BRCA2, BRIP1, CDH1, CDKN2A (p14ARF), CDKN2A (p16INK4a), CHEK2, CTNNA1, DICER1, EPCAM (Deletion/duplication testing only), GREM1 (promoter region deletion/duplication testing only), KIT, MEN1, MLH1, MSH2, MSH3, MSH6, MUTYH, NBN, NF1, NHTL1, PALB2, PDGFRA, PMS2, POLD1, POLE, PTEN, RAD50, RAD51C, RAD51D, SDHB, SDHC, SDHD, SMAD4, SMARCA4. STK11, TP53, TSC1, TSC2, and VHL.  The following genes were evaluated for sequence changes only: SDHA and HOXB13 c.251G>A variant only.    Results: No  pathogenic mutations identified.  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  The date of this test report is 05/22/2017.     05/24/2017 - 08/30/2017 Chemotherapy    Dose dense Adriamycin and Cytoxan 4 followed by Taxol weekly 5 (stopped early for neuropathy)     09/26/2017 - 10/22/2017 Radiation Therapy    Adjuvant radiation therapy     CHIEF COMPLIANT: Severe peripheral neuropathy and fatigue  INTERVAL HISTORY: Audrey Peters is a 62 year old with above-mentioned history of triple negative breast cancer who finished adjuvant chemotherapy and radiation is currently on surveillance.  She is complaining of peripheral neuropathy in the feet as well as the fingers.  There are neuropathy symptoms appear to keep her awake at night.  She was on gabapentin 3 mg at bedtime but it is causing her to be drowsy through the day as well.  She would like to switch it to something else.  She denies any lumps or nodules he does complain of intermittent occasional discomfort in the left breast.  Also experiencing significant fatigue.  REVIEW OF SYSTEMS:   Constitutional: Fatigue Eyes: Denies blurriness of vision Ears, nose, mouth, throat, and face: Denies mucositis or sore throat Respiratory: Denies cough, dyspnea or wheezes Cardiovascular: Denies palpitation, chest discomfort Gastrointestinal:  Denies nausea, heartburn or change in bowel habits Skin: Denies abnormal skin rashes Lymphatics: Denies new lymphadenopathy or easy bruising Neurological: Peripheral neuropathy in hands and feet Behavioral/Psych: Mood is stable, no new changes  Extremities: No lower extremity edema Breast: Left breast tenderness All other systems were reviewed with the patient and are negative.  I have reviewed the past medical history, past surgical history, social history and family history  with the patient and they are unchanged from previous note.  ALLERGIES:  is allergic to invokana [canagliflozin]; sulfa  antibiotics; and ciprofloxacin.  MEDICATIONS:  Current Outpatient Medications  Medication Sig Dispense Refill  . acetaminophen (TYLENOL 8 HOUR ARTHRITIS PAIN) 650 MG CR tablet Take 650 mg by mouth every 8 (eight) hours as needed for pain.    . clotrimazole (LOTRIMIN) 1 % cream Apply 1 application topically daily as needed (for irritated/itchy skin.).     Marland Kitchen DULoxetine (CYMBALTA) 20 MG capsule Take 1 capsule (20 mg total) by mouth daily. 90 capsule 3  . insulin regular human CONCENTRATED (HUMULIN R U-500 KWIKPEN) 500 UNIT/ML kwikpen Inject 50-120 Units into the skin 3 (three) times daily with meals. 120 units in the morning, 50 units at lunch and 50 units at supper.    . losartan (COZAAR) 100 MG tablet Take 100 mg by mouth daily.     No current facility-administered medications for this visit.     PHYSICAL EXAMINATION: ECOG PERFORMANCE STATUS: 1 - Symptomatic but completely ambulatory  Vitals:   04/24/18 1007  BP: 125/70  Pulse: 73  Resp: 18  Temp: 97.9 F (36.6 C)  SpO2: 100%   Filed Weights   04/24/18 1007  Weight: 230 lb 4.8 oz (104.5 kg)    GENERAL:alert, no distress and comfortable SKIN: skin color, texture, turgor are normal, no rashes or significant lesions EYES: normal, Conjunctiva are pink and non-injected, sclera clear OROPHARYNX:no exudate, no erythema and lips, buccal mucosa, and tongue normal  NECK: supple, thyroid normal size, non-tender, without nodularity LYMPH:  no palpable lymphadenopathy in the cervical, axillary or inguinal LUNGS: clear to auscultation and percussion with normal breathing effort HEART: regular rate & rhythm and no murmurs and no lower extremity edema ABDOMEN:abdomen soft, non-tender and normal bowel sounds MUSCULOSKELETAL:no cyanosis of digits and no clubbing  NEURO: alert & oriented x 3 with fluent speech, grade 2 peripheral neuropathy EXTREMITIES: No lower extremity edema   LABORATORY DATA:  I have reviewed the data as listed CMP  Latest Ref Rng & Units 09/13/2017 09/06/2017 08/30/2017  Glucose 70 - 140 mg/dL 274(H) 247(H) 241(H)  BUN 7 - 26 mg/dL '12 13 14  ' Creatinine 0.60 - 1.10 mg/dL 0.87 0.90 0.97  Sodium 136 - 145 mmol/L 138 136 137  Potassium 3.5 - 5.1 mmol/L 4.3 3.1(L) 3.3  Chloride 98 - 109 mmol/L 107 101 103  CO2 22 - 29 mmol/L 21(L) 23 23  Calcium 8.4 - 10.4 mg/dL 8.8 8.9 8.9  Total Protein 6.4 - 8.3 g/dL 6.3(L) 6.9 7.1  Total Bilirubin 0.2 - 1.2 mg/dL 0.5 0.8 0.6  Alkaline Phos 40 - 150 U/L 71 75 74  AST 5 - 34 U/L 52(H) 78(H) 69(H)  ALT 0 - 55 U/L 33 44 40    Lab Results  Component Value Date   WBC 3.1 (L) 09/13/2017   HGB 8.9 (L) 09/13/2017   HCT 27.4 (L) 09/13/2017   MCV 91.0 09/13/2017   PLT 204 09/13/2017   NEUTROABS 1.8 09/13/2017    ASSESSMENT & PLAN:  Malignant neoplasm of upper-outer quadrant of left breast in female, estrogen receptor negative ([place]) 04/30/2017: Left lumpectomy: IDC grade 3, 1.7 cm, DCIS, lymphovascular invasion present, margins negative, 0/1 lymph node negative, ER 0%, PR 0%, HER-2 negative ratio 1.37, Ki-67 40%, T1c N0 stage IB  Recommendation: 1. adjuvant chemotherapy with dose dense Adriamycin and Cytoxan 4 followed by Taxol weekly 6 discontinued for neuropathy 2. Followed by radiation started 09/26/2017 ---------------------------------------------------------------------------------------------------------------------------  Chemo-induced peripheral neuropathy: Patient was on 300 mg of gabapentin at bedtime and reported that she is feeling drowsy but it is not helping her neuropathy.  After much discussion we decided to switch her from gabapentin to Cymbalta.  We will start at 20 mg and if necessary go up to 40 then to 60 mg if needed.  Severe fatigue: Related to chemotherapy but also due to deconditioning.  I encouraged her to join the [employer] program.  Breast cancer surveillance: 1.  Breast exam June 2019: Benign 2. mammogram 04/18/2018: Benign  postlumpectomy radiation changes left breast, breast density category B  Return to clinic in 6 months for follow-up after that we can see her once a year    No orders of the defined types were placed in this encounter.  The patient has a good understanding of the overall plan. she agrees with it. she will call with any problems that may develop before the next visit here.   Harriette Ohara, MD 04/24/18

## 2018-07-15 ENCOUNTER — Other Ambulatory Visit: Payer: Self-pay | Admitting: Hematology and Oncology

## 2018-08-26 ENCOUNTER — Other Ambulatory Visit: Payer: Self-pay | Admitting: Hematology and Oncology

## 2018-10-26 NOTE — Assessment & Plan Note (Signed)
04/30/2017: Left lumpectomy: IDC grade 3, 1.7 cm, DCIS, lymphovascular invasion present, margins negative, 0/1 lymph node negative, ER 0%, PR 0%, HER-2 negative ratio 1.37, Ki-67 40%, T1c N0 stage IB  Recommendation: 1. adjuvant chemotherapy with dose dense Adriamycin and Cytoxan 4 followed by Taxol weekly 6discontinued for neuropathy 2. Followed by radiationstarted 09/26/2017-10/25/2018  --------------------------------------------------------------------------------------------------------------------------- Chemo-induced peripheral neuropathy: Patient was on 300 mg of gabapentin at bedtime and reported that she is feeling drowsy but it is not helping her neuropathy.  After much discussion we decided to switch her from gabapentin to Cymbalta.  We will start at 20 mg and if necessary go up to 40 then to 60 mg if needed.  Severe fatigue: Related to chemotherapy but also due to deconditioning.  I encouraged her to join the [employer] program.  Breast cancer surveillance: 1.  Breast exam 10/27/2018: Benign 2. mammogram 04/18/2018: Benign postlumpectomy radiation changes left breast, breast density category B  Return to clinic in one year

## 2018-10-27 ENCOUNTER — Inpatient Hospital Stay: Payer: BC Managed Care – PPO | Attending: Hematology and Oncology | Admitting: Hematology and Oncology

## 2018-10-27 ENCOUNTER — Telehealth: Payer: Self-pay | Admitting: Hematology and Oncology

## 2018-10-27 ENCOUNTER — Other Ambulatory Visit: Payer: Self-pay

## 2018-10-27 DIAGNOSIS — Z79899 Other long term (current) drug therapy: Secondary | ICD-10-CM | POA: Insufficient documentation

## 2018-10-27 DIAGNOSIS — Z794 Long term (current) use of insulin: Secondary | ICD-10-CM | POA: Insufficient documentation

## 2018-10-27 DIAGNOSIS — R530 Neoplastic (malignant) related fatigue: Secondary | ICD-10-CM | POA: Diagnosis not present

## 2018-10-27 DIAGNOSIS — Z923 Personal history of irradiation: Secondary | ICD-10-CM | POA: Insufficient documentation

## 2018-10-27 DIAGNOSIS — G620 Drug-induced polyneuropathy: Secondary | ICD-10-CM

## 2018-10-27 DIAGNOSIS — Z9221 Personal history of antineoplastic chemotherapy: Secondary | ICD-10-CM | POA: Insufficient documentation

## 2018-10-27 DIAGNOSIS — C50412 Malignant neoplasm of upper-outer quadrant of left female breast: Secondary | ICD-10-CM | POA: Insufficient documentation

## 2018-10-27 DIAGNOSIS — Z171 Estrogen receptor negative status [ER-]: Secondary | ICD-10-CM | POA: Insufficient documentation

## 2018-10-27 MED ORDER — PREGABALIN 150 MG PO CAPS: 150.0000 mg | ORAL_CAPSULE | Freq: Two times a day (BID) | ORAL | Status: DC

## 2018-10-27 MED ORDER — ATORVASTATIN CALCIUM 10 MG PO TABS: 10.0000 mg | ORAL_TABLET | Freq: Every day | ORAL | Status: DC

## 2018-10-27 MED ORDER — TRIAMTERENE-HCTZ 37.5-25 MG PO TABS: 1.0000 | ORAL_TABLET | Freq: Every day | ORAL | Status: DC

## 2018-10-27 MED ORDER — METFORMIN HCL 1000 MG PO TABS: 1000.0000 mg | ORAL_TABLET | Freq: Every day | ORAL | Status: DC

## 2018-10-27 NOTE — Telephone Encounter (Signed)
Gave avs and calendar ° °

## 2018-10-27 NOTE — Progress Notes (Signed)
Patient Care Team: [name], MD as PCP - General (Internal Medicine) [name], MD as Consulting Physician (Hematology and Oncology) Eppie Gibson, MD as Attending Physician (Radiation Oncology) Erroll Luna, MD as Consulting Physician (General Surgery) Delice Bison Charlestine Massed, NP as Nurse Practitioner (Hematology and Oncology)  DIAGNOSIS:  Encounter Diagnosis  Name Primary?  . Malignant neoplasm of upper-outer quadrant of left breast in female, estrogen receptor negative ([place])     SUMMARY OF ONCOLOGIC HISTORY:   Malignant neoplasm of upper-outer quadrant of left breast in female, estrogen receptor negative ([place])   04/05/2017 Initial Diagnosis    Left breast asymmetry by ultrasound measured 1.3 cm at 2:30 position 10 cm from nipple, no axillary lymph nodes; biopsy IDC grade 2, ER 0%, PR 0%, HER-2 negative ratio 1.37, Ki-67 40%, T1c N0 stage IB AJCC 8     04/30/2017 Surgery    Left lumpectomy: IDC grade 3, 1.7 cm, DCIS, lymphovascular invasion present, margins negative, 0/1 lymph node negative, ER 0%, PR 0%, HER-2 negative ratio 1.37, Ki-67 40%, T1c N0 stage IB    05/22/2017 Genetic Testing    Patient had genetic testing due to a personal history of triple negative breast cancer.  The Common Hereditary Cancer Panel was ordered. The Hereditary Gene Panel offered by [facility] includes sequencing and/or deletion duplication testing of the following 46 genes: APC, ATM, AXIN2, BARD1, BMPR1A, BRCA1, BRCA2, BRIP1, CDH1, CDKN2A (p14ARF), CDKN2A (p16INK4a), CHEK2, CTNNA1, DICER1, EPCAM (Deletion/duplication testing only), GREM1 (promoter region deletion/duplication testing only), KIT, MEN1, MLH1, MSH2, MSH3, MSH6, MUTYH, NBN, NF1, NHTL1, PALB2, PDGFRA, PMS2, POLD1, POLE, PTEN, RAD50, RAD51C, RAD51D, SDHB, SDHC, SDHD, SMAD4, SMARCA4. STK11, TP53, TSC1, TSC2, and VHL.  The following genes were evaluated for sequence changes only: SDHA and HOXB13 c.251G>A variant only.    Results: No  pathogenic mutations identified.  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  The date of this test report is 05/22/2017.     05/24/2017 - 08/30/2017 Chemotherapy    Dose dense Adriamycin and Cytoxan 4 followed by Taxol weekly 5 (stopped early for neuropathy)     09/26/2017 - 10/22/2017 Radiation Therapy    Adjuvant radiation therapy     CHIEF COMPLIANT: Surveillance of breast cancer, complains of neuropathy  INTERVAL HISTORY: Audrey Peters is a 63 year old with above-mentioned history of left breast cancer who was treated with lumpectomy followed by adjuvant chemotherapy.  She took only 5 dose of Taxol and developed neuropathy and we stopped her treatment.  In spite of that her neuropathy continues to be persistent and is causing her market discomfort in her hands and feet.  REVIEW OF SYSTEMS:   Constitutional: Denies fevers, chills or abnormal weight loss Eyes: Denies blurriness of vision Ears, nose, mouth, throat, and face: Denies mucositis or sore throat Respiratory: Denies cough, dyspnea or wheezes Cardiovascular: Denies palpitation, chest discomfort Gastrointestinal:  Denies nausea, heartburn or change in bowel habits Skin: Denies abnormal skin rashes Lymphatics: Denies new lymphadenopathy or easy bruising Neurological: Neuropathy in hands and feet Behavioral/Psych: Mood is stable, no new changes  Extremities: No lower extremity edema  All other systems were reviewed with the patient and are negative.  I have reviewed the past medical history, past surgical history, social history and family history with the patient and they are unchanged from previous note.  ALLERGIES:  is allergic to invokana [canagliflozin]; sulfa antibiotics; and ciprofloxacin.  MEDICATIONS:  Current Outpatient Medications  Medication Sig Dispense Refill  . acetaminophen (TYLENOL 8 HOUR ARTHRITIS PAIN) 650  MG CR tablet Take 650 mg by mouth every 8 (eight) hours as needed for pain.    Marland Kitchen  atorvastatin (LIPITOR) 10 MG tablet Take 1 tablet (10 mg total) by mouth daily.    . clotrimazole (LOTRIMIN) 1 % cream Apply 1 application topically daily as needed (for irritated/itchy skin.).     Marland Kitchen insulin regular human CONCENTRATED (HUMULIN R U-500 KWIKPEN) 500 UNIT/ML kwikpen Inject 50-120 Units into the skin 3 (three) times daily with meals. 120 units in the morning, 50 units at lunch and 50 units at supper.    . losartan (COZAAR) 100 MG tablet Take 100 mg by mouth daily.    . metFORMIN (GLUCOPHAGE) 1000 MG tablet Take 1 tablet (1,000 mg total) by mouth daily with breakfast.    . pregabalin (LYRICA) 150 MG capsule Take 1 capsule (150 mg total) by mouth 2 (two) times daily.    Marland Kitchen triamterene-hydrochlorothiazide (MAXZIDE-25) 37.5-25 MG tablet Take 1 tablet by mouth daily.     No current facility-administered medications for this visit.     PHYSICAL EXAMINATION: ECOG PERFORMANCE STATUS: 1 - Symptomatic but completely ambulatory  Vitals:   10/27/18 1347  BP: 130/87  Pulse: 77  Resp: 18  Temp: 98.3 F (36.8 C)  SpO2: 99%   Filed Weights   10/27/18 1347  Weight: 238 lb 8 oz (108.2 kg)    GENERAL:alert, no distress and comfortable SKIN: skin color, texture, turgor are normal, no rashes or significant lesions EYES: normal, Conjunctiva are pink and non-injected, sclera clear OROPHARYNX:no exudate, no erythema and lips, buccal mucosa, and tongue normal  NECK: supple, thyroid normal size, non-tender, without nodularity LYMPH:  no palpable lymphadenopathy in the cervical, axillary or inguinal LUNGS: clear to auscultation and percussion with normal breathing effort HEART: regular rate & rhythm and no murmurs and no lower extremity edema ABDOMEN:abdomen soft, non-tender and normal bowel sounds MUSCULOSKELETAL:no cyanosis of digits and no clubbing  NEURO: alert & oriented x 3 with fluent speech, 2+ peripheral neuropathy with weakness and intermittent pain EXTREMITIES: No lower extremity  edema   LABORATORY DATA:  I have reviewed the data as listed CMP Latest Ref Rng & Units 09/13/2017 09/06/2017 08/30/2017  Glucose 70 - 140 mg/dL 274(H) 247(H) 241(H)  BUN 7 - 26 mg/dL _0 Creatinine 0.60 - 1.10 mg/dL 0.87 0.90 0.97  Sodium 136 - 145 mmol/L 138 136 137  Potassium 3.5 - 5.1 mmol/L 4.3 3.1(L) 3.3  Chloride 98 - 109 mmol/L 107 101 103  CO2 22 - 29 mmol/L 21(L) 23 23  Calcium 8.4 - 10.4 mg/dL 8.8 8.9 8.9  Total Protein 6.4 - 8.3 g/dL 6.3(L) 6.9 7.1  Total Bilirubin 0.2 - 1.2 mg/dL 0.5 0.8 0.6  Alkaline Phos 40 - 150 U/L 71 75 74  AST 5 - 34 U/L 52(H) 78(H) 69(H)  ALT 0 - 55 U/L 33 44 40    Lab Results  Component Value Date   WBC 3.1 (L) 09/13/2017   HGB 8.9 (L) 09/13/2017   HCT 27.4 (L) 09/13/2017   MCV 91.0 09/13/2017   PLT 204 09/13/2017   NEUTROABS 1.8 09/13/2017    ASSESSMENT & PLAN:  Malignant neoplasm of upper-outer quadrant of left breast in female, estrogen receptor negative ([place]) 04/30/2017: Left lumpectomy: IDC grade 3, 1.7 cm, DCIS, lymphovascular invasion present, margins negative, 0/1 lymph node negative, ER 0%, PR 0%, HER-2 negative ratio 1.37, Ki-67 40%, T1c N0 stage IB  Recommendation: 1. adjuvant chemotherapy with dose dense Adriamycin and  Cytoxan 4 followed by Taxol weekly 6discontinued for neuropathy 2. Followed by radiationstarted 09/26/2017-10/25/2018  --------------------------------------------------------------------------------------------------------------------------- Chemo-induced peripheral neuropathy:  Currently on Lyrica because she could not tolerate Cymbalta.  I will refer her to physical therapy for transcutaneous electric nerve stimulation.  Severe fatigue: Related to chemotherapy but also due to deconditioning.  I once again encouraged her to join the [employer] live strong program.  Breast cancer surveillance: 1.  Breast exam 10/27/2018: Benign 2. mammogram 04/18/2018: Benign postlumpectomy radiation changes left breast,  breast density category B  Return to clinic in one year     Orders Placed This Encounter  Procedures  . Ambulatory referral to Physical Therapy    Referral Priority:   Routine    Referral Type:   Physical Medicine    Referral Reason:   Specialty Services Required    Requested Specialty:   Physical Therapy    Number of Visits Requested:   1   The patient has a good understanding of the overall plan. she agrees with it. she will call with any problems that may develop before the next visit here.   Harriette Ohara, MD 10/27/18

## 2018-10-28 ENCOUNTER — Encounter: Payer: Self-pay | Admitting: Physical Therapy

## 2018-10-28 ENCOUNTER — Ambulatory Visit: Payer: BC Managed Care – PPO | Attending: Hematology and Oncology | Admitting: Physical Therapy

## 2018-10-28 ENCOUNTER — Other Ambulatory Visit: Payer: Self-pay

## 2018-10-28 DIAGNOSIS — R29898 Other symptoms and signs involving the musculoskeletal system: Secondary | ICD-10-CM

## 2018-10-28 DIAGNOSIS — R262 Difficulty in walking, not elsewhere classified: Secondary | ICD-10-CM

## 2018-10-28 DIAGNOSIS — M6281 Muscle weakness (generalized): Secondary | ICD-10-CM | POA: Diagnosis present

## 2018-10-28 NOTE — Therapy (Signed)
[name], [place], [address] Phone: [phone]   Fax:  [phone]  Physical Therapy Evaluation  Patient Details  Name: Audrey Peters MRN: [id number] Date of Birth: August 18, 1955 Referring Provider (PT): Dr. Lindi Adie    Encounter Date: 10/28/2018  PT End of Session - 10/28/18 1717    Visit Number  1    Number of Visits  9    Date for PT Re-Evaluation  12/12/18    PT Start Time  1600    PT Stop Time  1645    PT Time Calculation (min)  45 min    Activity Tolerance  Patient tolerated treatment well    Behavior During Therapy  [facility] for tasks assessed/performed       Past Medical History:  Diagnosis Date  . Anemia yrs ago  . Arthritis   . Breast cancer ([place])   . Cancer [facility])    recent dx in breast  . Carpal tunnel syndrome of right wrist   . Diabetes mellitus without complication ([place])    dx 2008  . Headache    sinus  . Hypertension   . Personal history of chemotherapy   . Personal history of radiation therapy   . Sleep apnea    does not use cpap  . Vaginal delivery 1983    Past Surgical History:  Procedure Laterality Date  . BREAST BIOPSY    . BREAST LUMPECTOMY Left   . BREAST LUMPECTOMY WITH RADIOACTIVE SEED AND SENTINEL LYMPH NODE BIOPSY Left 04/30/2017   Procedure: LEFT BREAST LUMPECTOMY WITH RADIOACTIVE SEED AND LEFT SENTINEL LYMPH NODE BIOPSY ERAS PATHWAY;  Surgeon: Erroll Luna, MD;  Location: [facility];  Service: General;  Laterality: Left;  . COLONOSCOPY WITH PROPOFOL N/A 05/28/2016   Procedure: COLONOSCOPY WITH PROPOFOL;  Surgeon: Garlan Fair, MD;  Location: [facility];  Service: Endoscopy;  Laterality: N/A;  . DILATION AND CURETTAGE OF UTERUS    . HYSTEROSCOPY W/D&C N/A 07/21/2015   Procedure: DILATATION AND CURETTAGE /HYSTEROSCOPY with myosure;  Surgeon: Janyth Pupa, DO;  Location: [facility];  Service: Gynecology;  Laterality: N/A;  . PORTACATH PLACEMENT Right 04/30/2017   Procedure: INSERTION PORT-A-CATH;  Surgeon: Erroll Luna, MD;  Location: [facility];  Service: General;  Laterality: Right;    There were no vitals filed for this visit.   Subjective Assessment - 10/28/18 1609    Subjective  Pt says she has numbness and tingling in her fingers and toes/feet all the time.  She says she started lyrica and it helps some but she still has it . She says it started in the tips of her fingers and progressed. She says the pain gets worse at night     Pertinent History  left sided breast cancer in 2018 with lumpectomy, chemotherapy and radiation.  She develped the numbness and tingling during chemo and the treatment had to be stopped.     Currently in Pain?  Yes    Pain Score  --   did not rate, but pt grimaces    Pain Location  Foot    Pain Orientation  Left;Right    Pain Descriptors / Indicators  Tingling    Pain Type  Neuropathic pain    Pain Radiating Towards  sometimes up to legs     Pain Onset  More than a month ago    Pain Frequency  Intermittent    Aggravating Factors   worse at night when trying to sleep  Pain Relieving Factors  pain med dulls it     Effect of Pain on Daily Activities  she is not as active as she used to be          [facility] PT Assessment - 10/28/18 0001      Assessment   Medical Diagnosis  left breast cancer     Referring Provider (PT)  Dr. Lindi Adie     Onset Date/Surgical Date  04/05/17    Hand Dominance  Right      Precautions   Precautions  None      Restrictions   Weight Bearing Restrictions  No      Balance Screen   Has the patient fallen in the past 6 months  No    Has the patient had a decrease in activity level because of a fear of falling?   No    Is the patient reluctant to leave their home because of a fear of falling?   No      Home Social worker  Private residence    Living Arrangements  Spouse/significant other    Available Help at Discharge  Available PRN/intermittently      Prior  Function   Level of Independence  Independent      Cognition   Overall Cognitive Status  Within Functional Limits for tasks assessed      Observation/Other Assessments   Observations  pt comes in wearing gloves on her hands, she says sometimes she wears another glove on her hand so that it is easier for her to pick up things  She has some swelling in her left leg that she has had for many years.  She has visible varicose veins in left knee.     Quick DASH   70.45      Sensation   Additional Comments  numbness and tingling in both hands and feet       Coordination   Gross Motor Movements are Fluid and Coordinated  No   slowed     Functional Tests   Functional tests  Sit to Stand      Sit to Stand   Comments  8 times in 30 sec, little short of breath    uses hands to push up      Posture/Postural Control   Posture/Postural Control  Postural limitations    Postural Limitations  Rounded Shoulders;Forward head    Posture Comments  obesity      ROM / Strength   AROM / PROM / Strength  AROM;Strength      AROM   Overall AROM   Within functional limits for tasks performed    Right Shoulder Flexion  160 Degrees    Right Shoulder ABduction  145 Degrees    Right Shoulder External Rotation  70 Degrees    Left Shoulder Flexion  155 Degrees    Left Shoulder ABduction  140 Degrees    Left Shoulder External Rotation  70 Degrees      Strength   Overall Strength  Deficits    Overall Strength Comments  generalized decrease,     Strength Assessment Site  Hand    Right/Left hand  Right;Left    Right Hand Grip (lbs)  40/35/30    Left Hand Grip (lbs)  15/20/25      Ambulation/Gait   Ambulation/Gait  Yes    Ambulation/Gait Assistance  6: Modified independent (Device/Increase time)    Assistive device  None    Gait  Pattern  Step-through pattern;Ataxic    Gait velocity  .25m/sec which is limited community ambulator     Gait Comments  gait limited by pain and numbness in feet        Standardized Balance Assessment   Standardized Balance Assessment  Timed Up and Go Test      Timed Up and Go Test   Normal TUG (seconds)  11.64   pushes up with hands, normal TUG for age is 7.9       LYMPHEDEMA/ONCOLOGY QUESTIONNAIRE - 10/28/18 1633      Right Upper Extremity Lymphedema   10 cm Proximal to Olecranon Process  41.5 cm    Olecranon Process  31 cm    10 cm Proximal to Ulnar Styloid Process  24.5 cm    Just Proximal to Ulnar Styloid Process  16.2 cm    Across Hand at [employer]  20.5 cm    At [place] of 2nd Digit  5.7 cm      Left Upper Extremity Lymphedema   10 cm Proximal to Olecranon Process  40.5 cm    Olecranon Process  30 cm    10 cm Proximal to Ulnar Styloid Process  25.4 cm    Just Proximal to Ulnar Styloid Process  16.5 cm    Across Hand at [employer]  19.7 cm    At [place] of 2nd Digit  5.5 cm          Quick Dash - 10/28/18 0001    Open a tight or new jar  Severe difficulty    Do heavy household chores (wash walls, wash floors)  Moderate difficulty    Carry a shopping bag or briefcase  Moderate difficulty    Wash your back  Severe difficulty    Use a knife to cut food  Moderate difficulty    Recreational activities in which you take some force or impact through your arm, shoulder, or hand (golf, hammering, tennis)  Moderate difficulty    During the past week, to what extent has your arm, shoulder or hand problem interfered with your normal social activities with family, friends, neighbors, or groups?  Quite a bit    During the past week, to what extent has your arm, shoulder or hand problem limited your work or other regular daily activities  Quite a bit    Arm, shoulder, or hand pain.  Extreme    Tingling (pins and needles) in your arm, shoulder, or hand  Extreme    Difficulty Sleeping  Severe difficulty    DASH Score  70.45 %        Objective measurements completed on examination: See above findings.                   PT  Long Term Goals - 10/28/18 1731      PT LONG TERM GOAL #1   Title  Pt will decrease TUG to < 10 sec indicating an improvment in functional balance and decrease risk of falls.     Baseline  11.64 on eval     Time  6    Period  Weeks    Status  New      PT LONG TERM GOAL #2   Title  Pt will report that the pain and discomfort in her hands and feet have decrased by 25%    Time  6    Period  Weeks    Status  New  PT LONG TERM GOAL #3   Title  Pt will decrease her Quick DASH score to < 50 indicating a functional improvment in her UE    Baseline  70.45    Time  6    Period  Weeks      PT LONG TERM GOAL #4   Title  Pt will decrease her gait velocity to 6 seconds or > .8 m/sec so that she will be considered a community ambulator     Baseline  .78 m/sec     Time  6    Period  Weeks    Status  New      PT LONG TERM GOAL #5   Title  Pt will increase # of reps of sit to stand to > 11 in 30 seconds indicating overall improvement in functional strength    Baseline  8    Time  6    Period  Weeks    Status  New             Plan - 10/28/18 1718    Clinical Impression Statement  63 yo female who completed all breast cancer treatment about a year ago is still suffering from chemotherapy induced peripheral neuropathy in her hands and feet that is limiting her activities.  She has decreased grip strength in her left hand indicating a decrease in general strength  decreased TUG score and decreased gait velocity . I explained to her that while we do not have a specific treatment for her pain and tingling, she could benefit from strenthening, balane and functional improvment training and see if that would affect her pain symptoms. She agreed to try.     Personal Factors and Comorbidities  Comorbidity 1;Past/Current Experience;Comorbidity 2    Comorbidities  DM, Hx of chemotherapy , obesity     Examination-Activity Limitations  Locomotion Level;Other   peripheral neuropathy     Examination-Participation Restrictions  Other    Stability/Clinical Decision Making  Stable/Uncomplicated    Clinical Decision Making  Low    Rehab Potential  Good    PT Frequency  2x / week    PT Duration  4 weeks   period extended to 6 weeks to allow for potential missed sessions   PT Treatment/Interventions  ADLs/Self Care Home Management;Electrical Stimulation;Neuromuscular re-education;Moist Heat;Therapeutic exercise;Patient/family education;Manual techniques;Balance training;Therapeutic activities    PT Next Visit Plan  Begin general strengthening program for UE, monitor neuopathy symptoms of feet and hands and add moist heat/ massage/ estim if indicated  consider neuroscience pain education     Consulted and Agree with Plan of Care  Patient       Patient will benefit from skilled therapeutic intervention in order to improve the following deficits and impairments:  Abnormal gait, Decreased knowledge of use of DME, Pain, Postural dysfunction, Decreased endurance, Decreased activity tolerance, Decreased range of motion, Impaired perceived functional ability, Difficulty walking, Decreased balance  Visit Diagnosis: Other symptoms and signs involving the musculoskeletal system - Plan: PT plan of care cert/re-cert  Muscle weakness (generalized) - Plan: PT plan of care cert/re-cert  Difficulty in walking - Plan: PT plan of care cert/re-cert     Problem List Patient Active Problem List   Diagnosis Date Noted  . Chemotherapy-induced peripheral neuropathy ([place]) 01/22/2018  . Encounter for antineoplastic chemotherapy 06/07/2017  . Port-A-Cath in place 05/24/2017  . Genetic testing 05/23/2017  . Malignant neoplasm of upper-outer quadrant of left breast in female, estrogen receptor negative ([place]) 04/22/2017  . Essential  hypertension 01/30/2015  . Diabetes mellitus ([place]) 01/30/2015   Donato Heinz. Owens Shark PT  Norwood Levo 10/28/2018, 5:38 PM  [place], [place], [address] Phone: [phone]   Fax:  [phone]  Name: JAZILYN SIEGENTHALER MRN: [id number] Date of Birth: 10-Jul-1956

## 2018-11-03 ENCOUNTER — Ambulatory Visit: Payer: BC Managed Care – PPO | Admitting: Physical Therapy

## 2018-11-07 ENCOUNTER — Encounter: Payer: BC Managed Care – PPO | Admitting: Physical Therapy

## 2018-11-11 ENCOUNTER — Encounter: Payer: Self-pay | Admitting: General Practice

## 2018-11-11 ENCOUNTER — Encounter: Payer: BC Managed Care – PPO | Admitting: Physical Therapy

## 2018-11-11 NOTE — Progress Notes (Signed)
[facility] Team contacted patient to assess for food insecurity and other psychosocial needs during current COVID19 pandemic.  Unable to reach, left VM w my contact information and request to call and check in.   [name], [place]

## 2018-11-14 ENCOUNTER — Encounter: Payer: BC Managed Care – PPO | Admitting: Physical Therapy

## 2018-11-18 ENCOUNTER — Encounter: Payer: BC Managed Care – PPO | Admitting: Physical Therapy

## 2018-11-20 ENCOUNTER — Encounter: Payer: BC Managed Care – PPO | Admitting: Physical Therapy

## 2018-11-24 ENCOUNTER — Ambulatory Visit: Payer: BC Managed Care – PPO | Admitting: Physical Therapy

## 2018-11-27 ENCOUNTER — Encounter: Payer: BC Managed Care – PPO | Admitting: Physical Therapy

## 2018-12-02 ENCOUNTER — Encounter: Payer: BC Managed Care – PPO | Admitting: Physical Therapy

## 2018-12-04 ENCOUNTER — Encounter: Payer: BC Managed Care – PPO | Admitting: Physical Therapy

## 2018-12-23 ENCOUNTER — Other Ambulatory Visit: Payer: Self-pay

## 2018-12-23 ENCOUNTER — Encounter: Payer: Self-pay | Admitting: Physical Therapy

## 2018-12-23 ENCOUNTER — Ambulatory Visit: Payer: BC Managed Care – PPO | Attending: Hematology and Oncology | Admitting: Physical Therapy

## 2018-12-23 DIAGNOSIS — R262 Difficulty in walking, not elsewhere classified: Secondary | ICD-10-CM | POA: Diagnosis present

## 2018-12-23 DIAGNOSIS — R29898 Other symptoms and signs involving the musculoskeletal system: Secondary | ICD-10-CM | POA: Diagnosis not present

## 2018-12-23 DIAGNOSIS — M6281 Muscle weakness (generalized): Secondary | ICD-10-CM

## 2018-12-23 NOTE — Patient Instructions (Signed)
Access Code: [email or web address]  URL: [email or web address]  Date: 12/23/2018  Prepared by: Maudry Diego   Exercises  Supine Bridge - 10 reps - 1 sets - 1x daily - 7x weekly  Supine Active Straight Leg Raise - 10 reps - 3 sets - 1x daily - 7x weekly  Supine Lower Trunk Rotation - 10 reps - 3 sets - 1x daily - 7x weekly  Clamshell - 10 reps - 3 sets - 1x daily - 7x weekly  Sidelying Hip Abduction - 10 reps - 3 sets - 1x daily - 7x weekly  Standing Hip Abduction with Counter Support - 10 reps - 3 sets - 1x daily - 7x weekly  Standing March with Counter Support - 10 reps - 3 sets - 1x daily - 7x weekly  Standing Hip Extension with Counter Support - 10 reps - 3 sets - 1x daily - 7x weekly  Standing 4-Way Leg Reach with Counter Support - 10 reps - 3 sets - 1x daily - 7x weekly  Standing Knee Flexion with Counter Support - 10 reps - 3 sets - 1x daily - 7x weekly

## 2018-12-23 NOTE — Therapy (Signed)
[place], [place], [address] Phone: [phone]   Fax:  [phone]  Physical Therapy Treatment  Patient Details  Name: Audrey Peters MRN: [id number] Date of Birth: 1956-03-23 Referring Provider (PT): Dr. Lindi Adie    Encounter Date: 12/23/2018  PT End of Session - 12/23/18 1728    Visit Number  2    Number of Visits  9    Date for PT Re-Evaluation  03/28/19    PT Start Time  1500    PT Stop Time  1545    PT Time Calculation (min)  45 min    Activity Tolerance  Patient tolerated treatment well    Behavior During Therapy  [facility] for tasks assessed/performed       Past Medical History:  Diagnosis Date  . Anemia yrs ago  . Arthritis   . Breast cancer ([place])   . Cancer [facility])    recent dx in breast  . Carpal tunnel syndrome of right wrist   . Diabetes mellitus without complication ([place])    dx 2008  . Headache    sinus  . Hypertension   . Personal history of chemotherapy   . Personal history of radiation therapy   . Sleep apnea    does not use cpap  . Vaginal delivery 1983    Past Surgical History:  Procedure Laterality Date  . BREAST BIOPSY    . BREAST LUMPECTOMY Left   . BREAST LUMPECTOMY WITH RADIOACTIVE SEED AND SENTINEL LYMPH NODE BIOPSY Left 04/30/2017   Procedure: LEFT BREAST LUMPECTOMY WITH RADIOACTIVE SEED AND LEFT SENTINEL LYMPH NODE BIOPSY ERAS PATHWAY;  Surgeon: Erroll Luna, MD;  Location: [facility];  Service: General;  Laterality: Left;  . COLONOSCOPY WITH PROPOFOL N/A 05/28/2016   Procedure: COLONOSCOPY WITH PROPOFOL;  Surgeon: Garlan Fair, MD;  [facility];  Service: Endoscopy;  Laterality: N/A;  . DILATION AND CURETTAGE OF UTERUS    . HYSTEROSCOPY W/D&C N/A 07/21/2015   Procedure: DILATATION AND CURETTAGE /HYSTEROSCOPY with myosure;  Surgeon: Janyth Pupa, DO;  Location: [facility];  Service: Gynecology;  Laterality: N/A;  . PORTACATH PLACEMENT Right 04/30/2017   Procedure: INSERTION PORT-A-CATH;  Surgeon: Erroll Luna, MD;  Location: [facility];  Service: General;  Laterality: Right;    There were no vitals filed for this visit.  Subjective Assessment - 12/23/18 1504    Subjective  Pt state she has been staying in pretty much since March 17. She wants to learn a program that she could do at home     Pertinent History  left sided breast cancer in 2018 with lumpectomy, chemotherapy and radiation.  She develped the numbness and tingling during chemo and the treatment had to be stopped.     Patient Stated Goals  to get stronger     Currently in Pain?  Yes   I stay in pain all the time    Pain Score  10-Worst pain ever    Pain Location  Foot   across the back and down left leg, hands    Pain Orientation  Right;Left    Pain Descriptors / Indicators  Aching;Throbbing;Tingling    Pain Type  Neuropathic pain    Pain Radiating Towards  across back and lets     Pain Onset  More than a month ago    Pain Frequency  Intermittent    Aggravating Factors   worse at night     Pain Relieving Factors  pain meds  help a little     Effect of Pain on Daily Activities  she is not as active          [facility] PT Assessment - 12/23/18 0001      Assessment   Medical Diagnosis  left breast cancer     Referring Provider (PT)  Dr. Lindi Adie     Onset Date/Surgical Date  04/05/17      Prior Function   Level of Independence  Independent                   [facility] Adult PT Treatment/Exercise - 12/23/18 0001      Exercises   Exercises  Shoulder;Lumbar;Knee/Hip;Other Exercises      Lumbar Exercises: Supine   Bridge  10 reps      Knee/Hip Exercises: Standing   Hip Flexion  Stengthening;Right;Left    Hip Abduction  Stengthening;Right;Left;10 reps    Hip Extension  Stengthening;Right;Left;10 reps    Other Standing Knee Exercises  --   circles with toe forward to diagonal behing x 10      Knee/Hip Exercises: Sidelying   Hip ABduction   Strengthening;Right;Left;10 reps    Clams  --   10 reps on each side      Shoulder Exercises: Standing   Row  Strengthening;Right;Left;10 reps    Theraband Level (Shoulder Row)  Level 2 (Red)             PT Education - 12/23/18 1727    Education Details  home exercise program in supine and standing for LE , core and UE     Person(s) Educated  Patient    Methods  Explanation;Demonstration;Handout    Comprehension  Verbalized understanding;Returned demonstration          PT Long Term Goals - 12/23/18 1731      PT LONG TERM GOAL #1   Title  Pt will decrease TUG to < 10 sec indicating an improvment in functional balance and decrease risk of falls.     Baseline  11.64 on eval     Time  3    Period  Months    Status  On-going      PT LONG TERM GOAL #2   Title  Pt will report that the pain and discomfort in her hands and feet have decrased by 25%    Time  3    Period  Months    Status  On-going      PT LONG TERM GOAL #3   Title  Pt will decrease her Quick DASH score to < 50 indicating a functional improvment in her UE    Baseline  70.45    Time  3    Period  Months    Status  On-going      PT LONG TERM GOAL #4   Title  Pt will decrease her gait velocity to 6 seconds or > .8 m/sec so that she will be considered a community ambulator     Baseline  .78 m/sec     Time  3    Period  Months    Status  On-going      PT LONG TERM GOAL #5   Title  Pt will increase # of reps of sit to stand to > 11 in 30 seconds indicating overall improvement in functional strength    Baseline  8    Time  3    Period  Months    Status  On-going  Plan - 12/23/18 1729    Clinical Impression Statement  Pt reports she is still having pain and decreased mobility and wants to exercise to get stronger.  She can only come one time a month due to financial reasons until her out of pocket is met so wants to learn an exercise program she can do on [employer] at home      Comorbidities  DM, Hx of chemotherapy , obesity     Examination-Activity Limitations  Locomotion Level;Other    Examination-Participation Restrictions  Other    Rehab Potential  Good    PT Frequency  Monthy    PT Duration  --   3 months    PT Treatment/Interventions  ADLs/Self Care Home Management;Electrical Stimulation;Neuromuscular re-education;Moist Heat;Therapeutic exercise;Patient/family education;Manual techniques;Balance training;Therapeutic activities    PT Next Visit Plan  reassess for goals, upgrade balance home exercise program     Consulted and Agree with Plan of Care  Patient       Patient will benefit from skilled therapeutic intervention in order to improve the following deficits and impairments:  Abnormal gait, Decreased knowledge of use of DME, Pain, Postural dysfunction, Decreased endurance, Decreased activity tolerance, Decreased range of motion, Impaired perceived functional ability, Difficulty walking, Decreased balance  Visit Diagnosis: Other symptoms and signs involving the musculoskeletal system - Plan: PT plan of care cert/re-cert  Muscle weakness (generalized) - Plan: PT plan of care cert/re-cert  Difficulty in walking - Plan: PT plan of care cert/re-cert     Problem List Patient Active Problem List   Diagnosis Date Noted  . Chemotherapy-induced peripheral neuropathy ([place]) 01/22/2018  . Encounter for antineoplastic chemotherapy 06/07/2017  . Port-A-Cath in place 05/24/2017  . Genetic testing 05/23/2017  . Malignant neoplasm of upper-outer quadrant of left breast in female, estrogen receptor negative ([place]) 04/22/2017  . Essential hypertension 01/30/2015  . Diabetes mellitus ([place]) 01/30/2015   Donato Heinz. Owens Shark PT  Norwood Levo 12/23/2018, 5:36 PM  [place], [place], [address] Phone: [phone]   Fax:  [phone]  Name: Audrey Peters MRN: [id number] Date of  Birth: 1956-05-14

## 2019-01-20 ENCOUNTER — Encounter: Payer: Self-pay | Admitting: Physical Therapy

## 2019-01-20 ENCOUNTER — Ambulatory Visit: Payer: BC Managed Care – PPO | Attending: Hematology and Oncology | Admitting: Physical Therapy

## 2019-01-20 ENCOUNTER — Other Ambulatory Visit: Payer: Self-pay

## 2019-01-20 DIAGNOSIS — R29898 Other symptoms and signs involving the musculoskeletal system: Secondary | ICD-10-CM | POA: Insufficient documentation

## 2019-01-20 DIAGNOSIS — R262 Difficulty in walking, not elsewhere classified: Secondary | ICD-10-CM | POA: Insufficient documentation

## 2019-01-20 DIAGNOSIS — M6281 Muscle weakness (generalized): Secondary | ICD-10-CM | POA: Insufficient documentation

## 2019-01-20 NOTE — Therapy (Addendum)
[place], [place], [address] Phone: [phone]   Fax:  [phone]  Physical Therapy Treatment  Patient Details  Name: Audrey Peters MRN: [id number] Date of Birth: [birth year] Referring Provider (PT): Dr. Lindi Adie    Encounter Date: 01/20/2019  PT End of Session - 01/20/19 1529    Visit Number  3    Number of Visits  9    Date for PT Re-Evaluation  03/28/19    PT Start Time  1430    PT Stop Time  1520    PT Time Calculation (min)  50 min    Activity Tolerance  Patient tolerated treatment well    Behavior During Therapy  [facility] for tasks assessed/performed       Past Medical History:  Diagnosis Date  . Anemia yrs ago  . Arthritis   . Breast cancer ([place])   . Cancer [facility])    recent dx in breast  . Carpal tunnel syndrome of right wrist   . Diabetes mellitus without complication ([place])    dx 2008  . Headache    sinus  . Hypertension   . Personal history of chemotherapy   . Personal history of radiation therapy   . Sleep apnea    does not use cpap  . Vaginal delivery 1983    Past Surgical History:  Procedure Laterality Date  . BREAST BIOPSY    . BREAST LUMPECTOMY Left   . BREAST LUMPECTOMY WITH RADIOACTIVE SEED AND SENTINEL LYMPH NODE BIOPSY Left 04/30/2017   Procedure: LEFT BREAST LUMPECTOMY WITH RADIOACTIVE SEED AND LEFT SENTINEL LYMPH NODE BIOPSY ERAS PATHWAY;  Surgeon: Erroll Luna, MD;  Location: [facility];  Service: General;  Laterality: Left;  . COLONOSCOPY WITH PROPOFOL N/A 05/28/2016   Procedure: COLONOSCOPY WITH PROPOFOL;  Surgeon: Garlan Fair, MD;  [facility];  Service: Endoscopy;  Laterality: N/A;  . DILATION AND CURETTAGE OF UTERUS    . HYSTEROSCOPY W/D&C N/A 07/21/2015   Procedure: DILATATION AND CURETTAGE /HYSTEROSCOPY with myosure;  Surgeon: Janyth Pupa, DO;  Location: [facility];  Service: Gynecology;  Laterality: N/A;  . PORTACATH PLACEMENT Right 04/30/2017    Procedure: INSERTION PORT-A-CATH;  Surgeon: Erroll Luna, MD;  Location: [facility];  Service: General;  Laterality: Right;    There were no vitals filed for this visit.  Subjective Assessment - 01/20/19 1436    Subjective  Pt states that she is still having problems with her neuropathy in her hands and and her feet that has not really changed much.  She states she has been doing her exercises at home that she learned the last time and is ready to advance.  She is limited in walking by pain in her back and left leg and some in the right leg, but it is not as back. She is having visible swelling in her left leg today with visible vein varicosities.  She has compression stockings, but does not wear them everyday.  She is having tenderness in her left leg     Pertinent History  left sided breast cancer in 2018 with lumpectomy, chemotherapy and radiation.  She develped the numbness and tingling during chemo and the treatment had to be stopped.     Patient Stated Goals  to get stronger     Currently in Pain?  Yes    Pain Score  8     Pain Location  Leg    Pain Orientation  Left    Pain  Descriptors / Indicators  Shooting    Pain Type  Neuropathic pain    Pain Radiating Towards  across back and legs     Pain Onset  More than a month ago    Pain Frequency  Intermittent                       [facility] Adult PT Treatment/Exercise - 01/20/19 0001      Exercises   Exercises  Other Exercises    Other Exercises   pt given link for exercise programs on [email or web address] and also given [place] program as outlines in patient instruction section.  She practiced each exercise and was able to demonstrate good form with each exercise.        Knee/Hip Exercises: Supine   Other Supine Knee/Hip Exercises  elevation on 12 inch bolster with foot higher that knee and abdomen.  Pt felt some relief with 15 minutes of elevation with ankle pumps while we were discussing use of elastic stockings.  Pt given  suggestion to use athletic pants to support varicose veins and go to [facility] in [place] for reasonably priced stockings.                   PT Long Term Goals - 12/23/18 1731      PT LONG TERM GOAL #1   Title  Pt will decrease TUG to < 10 sec indicating an improvment in functional balance and decrease risk of falls.     Baseline  11.64 on eval     Time  3    Period  Months    Status  On-going      PT LONG TERM GOAL #2   Title  Pt will report that the pain and discomfort in her hands and feet have decrased by 25%    Time  3    Period  Months    Status  On-going      PT LONG TERM GOAL #3   Title  Pt will decrease her Quick DASH score to < 50 indicating a functional improvment in her UE    Baseline  70.45    Time  3    Period  Months    Status  On-going      PT LONG TERM GOAL #4   Title  Pt will decrease her gait velocity to 6 seconds or > .8 m/sec so that she will be considered a community ambulator     Baseline  .78 m/sec     Time  3    Period  Months    Status  On-going      PT LONG TERM GOAL #5   Title  Pt will increase # of reps of sit to stand to > 11 in 30 seconds indicating overall improvement in functional strength    Baseline  8    Time  3    Period  Months    Status  On-going            Plan - 01/20/19 1529    Clinical Impression Statement  Pt states she is still having pain in back and left leg with visible swelling and varicositis in left leg around knee and up into thigh.  She did well with upgraded exercise session and reports she will be able to follow it online.  Pt expressed understanding of all and will come back for recheck next month due to financial limits  Rehab Potential  Good    PT Frequency  Monthy    PT Treatment/Interventions  ADLs/Self Care Home Management;Electrical Stimulation;Neuromuscular re-education;Moist Heat;Therapeutic exercise;Patient/family education;Manual techniques;Balance training;Therapeutic activities     PT Next Visit Plan  reassess for goals, upgrade balance home exercise program        Patient will benefit from skilled therapeutic intervention in order to improve the following deficits and impairments:  Abnormal gait, Decreased knowledge of use of DME, Pain, Postural dysfunction, Decreased endurance, Decreased activity tolerance, Decreased range of motion, Impaired perceived functional ability, Difficulty walking, Decreased balance  Visit Diagnosis: Other symptoms and signs involving the musculoskeletal system  Muscle weakness (generalized)  Difficulty in walking     Problem List Patient Active Problem List   Diagnosis Date Noted  . Chemotherapy-induced peripheral neuropathy ([place]) 01/22/2018  . Encounter for antineoplastic chemotherapy 06/07/2017  . Port-A-Cath in place 05/24/2017  . Genetic testing 05/23/2017  . Malignant neoplasm of upper-outer quadrant of left breast in female, estrogen receptor negative ([place]) 04/22/2017  . Essential hypertension 01/30/2015  . Diabetes mellitus ([place]) 01/30/2015   Donato Heinz. Owens Shark, PT  [place] Levo 01/20/2019, 3:32 PM  [place], [place], [address] Phone: [phone]   Fax:  [phone]  Name: Audrey Peters MRN: [id number] Date of Birth: 13-Jan-1956  PHYSICAL THERAPY DISCHARGE SUMMARY  Visits from Start of Care: 3 Current functional level related to goals / functional outcomes: unknown   Remaining deficits: unknown   Education / Equipment: Home exercise  Plan: Patient agrees to discharge.  Patient goals were partially met. Patient is being discharged due to not returning since the last visit.  ?????    Donato Heinz. Owens Shark, PT

## 2019-01-20 NOTE — Patient Instructions (Signed)
[email or web address] Code: [email or web address]  URL: [email or web address]  Date: 01/20/2019  Prepared by: Maudry Diego   Program Notes  Do these exercises 2-3 times a week making sure you have a day of rest in between sessions. Before you start a session, warm up with a brisk walk and stretches for your arms, legs and back. Start with 2 sets of 10 of each exercise If you have no pain or swelling, next time do 3 sets of 10. If still no pain the next session, its ok to add some small weight, but back to down to 2 sets of 10 . the next time do 3 sets of 10 with that weight and continue to progress with that pattern. If you increase weight, decrease reps, or if you increase reps, keep weight the same. If you do have symptoms, back off on your weight or reps. If for some reason, you do skip some scheduled exercise sessions, start over with minimal weight and build up slowly again.  Your overall exercise goal is about 150 minutes of moderate intensity (you can talk but not sing) exercise a week that includes 2 sessions of strength training. GO FOR IT! :)   Exercises Wall Push Up - 10 reps - 2-3 sets - hold - 2x weekly Squat with Chair Touch - 10 reps - 2-3 sets - 2x weekly Standing Bent Over Single Arm Shoulder Row - 10 reps - 2-3 sets - hold - 2x weekly Standing Hip Abduction - 10 reps - 2-3 sets - 2x weekly Scaption with Dumbbells - 10 reps - 2-3 sets - 2x weekly Step Up - 10 reps - 2-3 sets - 2x weekly Standing Alternating Bicep Curls with Dumbbells and Rotation - 10 reps - 2-3 sets - 2x weekly Standing Heel Raise - 10 reps - 2-3 sets - 2x weekly Patient Education walking program  WALKING  Walking is a great form of exercise to increase your strength, endurance and overall fitness.  A walking program can help you start slowly and gradually build endurance as you go.  Everyone's ability is different, so each person's starting point will be different.  You do not have to follow them exactly.   The are just samples. You should simply find out what's right for you and stick to that program.   In the beginning, you'll start off walking 2-3 times a day for short distances.  As you get stronger, you'll be walking further at just 1-2 times per day.  A. You Can Walk For A Certain Length Of Time Each Day    Walk 5 minutes 3 times per day.  Increase 2 minutes every 2 days (3 times per day).  Work up to 25-30 minutes (1-2 times per day).   Example:   Day 1-2 5 minutes 3 times per day   Day 7-8 12 minutes 2-3 times per day   Day 13-14 25 minutes 1-2 times per day  B. You Can Walk For a Certain Distance Each Day     Distance can be substituted for time.    Example:   3 trips to mailbox (at road)   3 trips to corner of block   3 trips around the block  C. Go to local [employer] and use the track.    Walk for distance ____ around track  Or time ____ minutes  D. Walk ____ Jog ____ Run ___  Please only do the exercises that your therapist has initialed and dated

## 2019-02-24 ENCOUNTER — Ambulatory Visit: Payer: BC Managed Care – PPO | Admitting: Physical Therapy

## 2019-03-13 ENCOUNTER — Other Ambulatory Visit: Payer: Self-pay | Admitting: Internal Medicine

## 2019-03-13 DIAGNOSIS — M545 Low back pain, unspecified: Secondary | ICD-10-CM

## 2019-03-13 DIAGNOSIS — M5430 Sciatica, unspecified side: Secondary | ICD-10-CM

## 2019-03-13 DIAGNOSIS — M4726 Other spondylosis with radiculopathy, lumbar region: Secondary | ICD-10-CM

## 2019-03-19 ENCOUNTER — Ambulatory Visit
Admission: RE | Admit: 2019-03-19 | Discharge: 2019-03-19 | Disposition: A | Discharge status: Home / Self Care | Payer: BC Managed Care – PPO | Source: Ambulatory Visit | Attending: Internal Medicine | Admitting: Internal Medicine

## 2019-03-19 ENCOUNTER — Other Ambulatory Visit: Payer: Self-pay

## 2019-03-19 DIAGNOSIS — M545 Low back pain, unspecified: Secondary | ICD-10-CM

## 2019-03-19 DIAGNOSIS — M4726 Other spondylosis with radiculopathy, lumbar region: Secondary | ICD-10-CM

## 2019-03-19 DIAGNOSIS — M5430 Sciatica, unspecified side: Secondary | ICD-10-CM

## 2019-03-31 ENCOUNTER — Ambulatory Visit: Payer: BC Managed Care – PPO | Admitting: Physical Therapy

## 2019-05-12 ENCOUNTER — Other Ambulatory Visit: Payer: Self-pay | Admitting: Adult Health

## 2019-05-12 ENCOUNTER — Other Ambulatory Visit: Payer: Self-pay | Admitting: Internal Medicine

## 2019-05-12 DIAGNOSIS — Z853 Personal history of malignant neoplasm of breast: Secondary | ICD-10-CM

## 2019-05-21 ENCOUNTER — Other Ambulatory Visit: Payer: Self-pay

## 2019-05-21 ENCOUNTER — Ambulatory Visit
Admission: RE | Admit: 2019-05-21 | Discharge: 2019-05-21 | Disposition: A | Discharge status: Home / Self Care | Payer: BC Managed Care – PPO | Source: Ambulatory Visit | Attending: Internal Medicine | Admitting: Internal Medicine

## 2019-05-21 DIAGNOSIS — Z853 Personal history of malignant neoplasm of breast: Secondary | ICD-10-CM

## 2019-10-15 ENCOUNTER — Ambulatory Visit: Payer: BC Managed Care – PPO | Attending: Family

## 2019-10-15 ENCOUNTER — Other Ambulatory Visit: Payer: Self-pay

## 2019-10-15 DIAGNOSIS — Z23 Encounter for immunization: Secondary | ICD-10-CM | POA: Insufficient documentation

## 2019-10-15 NOTE — Progress Notes (Signed)
   Covid-19 Vaccination Clinic  Name:  ADISSON LUEBBERING    MRN: [id number] DOB: 1955/09/29  10/15/2019  Ms. Benvenuti was observed post Covid-19 immunization for 15 minutes without incident. She was provided with Vaccine Information Sheet and instruction to access the V-Safe system.   Ms. Shakespeare was instructed to call 911 with any severe reactions post vaccine: Marland Kitchen Difficulty breathing  . Swelling of face and throat  . A fast heartbeat  . A bad rash all over body  . Dizziness and weakness   Immunizations Administered    Name Date Dose VIS Date Route   Moderna COVID-19 Vaccine 10/15/2019 10:44 AM 0.5 mL 07/14/2019 Intramuscular   Manufacturer: Moderna   Lot: [id number]   [place]:[id number]

## 2019-10-26 NOTE — Progress Notes (Signed)
Patient Care Team: [name], MD as PCP - General (Internal Medicine) [name], MD as Consulting Physician (Hematology and Oncology) Eppie Gibson, MD as Attending Physician (Radiation Oncology) Erroll Luna, MD as Consulting Physician (General Surgery) Gardenia Phlegm, NP as Nurse Practitioner (Hematology and Oncology)  DIAGNOSIS:    ICD-10-CM   1. Malignant neoplasm of upper-outer quadrant of left breast in female, estrogen receptor negative ([name])  C50.412    Z17.1     SUMMARY OF ONCOLOGIC HISTORY: Oncology History  Malignant neoplasm of upper-outer quadrant of left breast in female, estrogen receptor negative ([place])  04/05/2017 Initial Diagnosis   Left breast asymmetry by ultrasound measured 1.3 cm at 2:30 position 10 cm from nipple, no axillary lymph nodes; biopsy IDC grade 2, ER 0%, PR 0%, HER-2 negative ratio 1.37, Ki-67 40%, T1c N0 stage IB AJCC 8    04/30/2017 Surgery   Left lumpectomy: IDC grade 3, 1.7 cm, DCIS, lymphovascular invasion present, margins negative, 0/1 lymph node negative, ER 0%, PR 0%, HER-2 negative ratio 1.37, Ki-67 40%, T1c N0 stage IB   05/22/2017 Genetic Testing   Patient had genetic testing due to a personal history of triple negative breast cancer.  The Common Hereditary Cancer Panel was ordered. The Hereditary Gene Panel offered by [facility] includes sequencing and/or deletion duplication testing of the following 46 genes: APC, ATM, AXIN2, BARD1, BMPR1A, BRCA1, BRCA2, BRIP1, CDH1, CDKN2A (p14ARF), CDKN2A (p16INK4a), CHEK2, CTNNA1, DICER1, EPCAM (Deletion/duplication testing only), GREM1 (promoter region deletion/duplication testing only), KIT, MEN1, MLH1, MSH2, MSH3, MSH6, MUTYH, NBN, NF1, NHTL1, PALB2, PDGFRA, PMS2, POLD1, POLE, PTEN, RAD50, RAD51C, RAD51D, SDHB, SDHC, SDHD, SMAD4, SMARCA4. STK11, TP53, TSC1, TSC2, and VHL.  The following genes were evaluated for sequence changes only: SDHA and HOXB13 c.251G>A variant only.    Results: No  pathogenic mutations identified.  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  The date of this test report is 05/22/2017.    05/24/2017 - 08/30/2017 Chemotherapy   Dose dense Adriamycin and Cytoxan 4 followed by Taxol weekly 5 (stopped early for neuropathy)    09/26/2017 - 10/22/2017 Radiation Therapy   Adjuvant radiation therapy     CHIEF COMPLIANT: Surveillance of breast cancer  INTERVAL HISTORY: Audrey Peters is a 64 y.o. with above-mentioned history of breast cancer treated with lumpectomy, adjuvant chemotherapy discontinued after 5 cycles of Taxol due to neuropathy, radiation, and who is currently on surveillance. Mammogram on 05/21/19 showed no evidence of malignancy bilaterally. She presents to the clinic today for annual follow-up.  She continues to have neuropathy in her fingers and toes.  She has had problems with the low back and is getting injections through that area for pinched nerves.  Denies any pain lumps or nodules in the breast.  ALLERGIES:  is allergic to invokana [canagliflozin]; sulfa antibiotics; and ciprofloxacin.  MEDICATIONS:  Current Outpatient Medications  Medication Sig Dispense Refill  . acetaminophen (TYLENOL 8 HOUR ARTHRITIS PAIN) 650 MG CR tablet Take 650 mg by mouth every 8 (eight) hours as needed for pain.    Marland Kitchen atorvastatin (LIPITOR) 10 MG tablet Take 1 tablet (10 mg total) by mouth daily.    . clotrimazole (LOTRIMIN) 1 % cream Apply 1 application topically daily as needed (for irritated/itchy skin.).     Marland Kitchen insulin regular human CONCENTRATED (HUMULIN R U-500 KWIKPEN) 500 UNIT/ML kwikpen Inject 50-120 Units into the skin 3 (three) times daily with meals. 120 units in the morning, 50 units at lunch and 50 units at supper.    Marland Kitchen  losartan (COZAAR) 100 MG tablet Take 100 mg by mouth daily.    . metFORMIN (GLUCOPHAGE) 1000 MG tablet Take 1 tablet (1,000 mg total) by mouth daily with breakfast.    . pregabalin (LYRICA) 150 MG capsule Take 1 capsule  (150 mg total) by mouth 2 (two) times daily.    Marland Kitchen triamterene-hydrochlorothiazide (MAXZIDE-25) 37.5-25 MG tablet Take 1 tablet by mouth daily.     No current facility-administered medications for this visit.    PHYSICAL EXAMINATION: ECOG PERFORMANCE STATUS: 1 - Symptomatic but completely ambulatory  Vitals:   10/27/19 1046  BP: (!) 139/91  Pulse: 95  Resp: 18  Temp: 98 F (36.7 C)  SpO2: 98%   Filed Weights   10/27/19 1046  Weight: 238 lb 8 oz (108.2 kg)    BREAST: No palpable masses or nodules in either right or left breasts. No palpable axillary supraclavicular or infraclavicular adenopathy no breast tenderness or nipple discharge. (exam performed in the presence of a chaperone)  LABORATORY DATA:  I have reviewed the data as listed CMP Latest Ref Rng & Units 09/13/2017 09/06/2017 08/30/2017  Glucose 70 - 140 mg/dL 274(H) 247(H) 241(H)  BUN 7 - 26 mg/dL '12 13 14  '$ Creatinine 0.60 - 1.10 mg/dL 0.87 0.90 0.97  Sodium 136 - 145 mmol/L 138 136 137  Potassium 3.5 - 5.1 mmol/L 4.3 3.1(L) 3.3  Chloride 98 - 109 mmol/L 107 101 103  CO2 22 - 29 mmol/L 21(L) 23 23  Calcium 8.4 - 10.4 mg/dL 8.8 8.9 8.9  Total Protein 6.4 - 8.3 g/dL 6.3(L) 6.9 7.1  Total Bilirubin 0.2 - 1.2 mg/dL 0.5 0.8 0.6  Alkaline Phos 40 - 150 U/L 71 75 74  AST 5 - 34 U/L 52(H) 78(H) 69(H)  ALT 0 - 55 U/L 33 44 40    Lab Results  Component Value Date   WBC 3.1 (L) 09/13/2017   HGB 8.9 (L) 09/13/2017   HCT 27.4 (L) 09/13/2017   MCV 91.0 09/13/2017   PLT 204 09/13/2017   NEUTROABS 1.8 09/13/2017    ASSESSMENT & PLAN:  Malignant neoplasm of upper-outer quadrant of left breast in female, estrogen receptor negative ([place]) 04/30/2017: Left lumpectomy: IDC grade 3, 1.7 cm, DCIS, lymphovascular invasion present, margins negative, 0/1 lymph node negative, ER 0%, PR 0%, HER-2 negative ratio 1.37, Ki-67 40%, T1c N0 stage IB  Recommendation: 1. adjuvant chemotherapy with dose dense Adriamycin and Cytoxan 4  followed by Taxol weekly 6discontinued for neuropathy 2. Followed by radiationstarted 09/26/2017-10/25/2018  --------------------------------------------------------------------------------------------------------------------------- Chemo-induced peripheral neuropathy: Currently on Lyrica because she could not tolerate Cymbalta.  I will refer her to physical therapy for transcutaneous electric nerve stimulation.  Severe fatigue: Related to chemotherapy but also due to deconditioning. I once again encouraged her to join the [employer] live strong program. Obesity: We discussed about the importance of losing weight using portion control and avoiding simple sugars.  Breast cancer surveillance: 1.Breast exam 10/27/2019: Benign 2.mammogram 05/21/2019: Benign postlumpectomy radiation changes left breast, breast density category B  Return to clinic in one year   No orders of the defined types were placed in this encounter.  The patient has a good understanding of the overall plan. she agrees with it. she will call with any problems that may develop before the next visit here.  Total time spent: 20 mins including face to face time and time spent for planning, charting and coordination of care  [name], MD 10/27/2019  I, Cloyde Reams Dorshimer, am acting as Education administrator for  Dr. [name].  I have reviewed the above documentation for accuracy and completeness, and I agree with the above.

## 2019-10-27 ENCOUNTER — Inpatient Hospital Stay: Payer: BC Managed Care – PPO | Attending: Hematology and Oncology | Admitting: Hematology and Oncology

## 2019-10-27 ENCOUNTER — Other Ambulatory Visit: Payer: Self-pay

## 2019-10-27 DIAGNOSIS — E669 Obesity, unspecified: Secondary | ICD-10-CM | POA: Insufficient documentation

## 2019-10-27 DIAGNOSIS — G620 Drug-induced polyneuropathy: Secondary | ICD-10-CM | POA: Insufficient documentation

## 2019-10-27 DIAGNOSIS — Z79899 Other long term (current) drug therapy: Secondary | ICD-10-CM | POA: Insufficient documentation

## 2019-10-27 DIAGNOSIS — Z923 Personal history of irradiation: Secondary | ICD-10-CM | POA: Diagnosis not present

## 2019-10-27 DIAGNOSIS — C50412 Malignant neoplasm of upper-outer quadrant of left female breast: Secondary | ICD-10-CM | POA: Diagnosis not present

## 2019-10-27 DIAGNOSIS — Z794 Long term (current) use of insulin: Secondary | ICD-10-CM | POA: Diagnosis not present

## 2019-10-27 DIAGNOSIS — Z9221 Personal history of antineoplastic chemotherapy: Secondary | ICD-10-CM | POA: Insufficient documentation

## 2019-10-27 DIAGNOSIS — Z171 Estrogen receptor negative status [ER-]: Secondary | ICD-10-CM | POA: Diagnosis not present

## 2019-10-27 NOTE — Assessment & Plan Note (Signed)
04/30/2017: Left lumpectomy: IDC grade 3, 1.7 cm, DCIS, lymphovascular invasion present, margins negative, 0/1 lymph node negative, ER 0%, PR 0%, HER-2 negative ratio 1.37, Ki-67 40%, T1c N0 stage IB  Recommendation: 1. adjuvant chemotherapy with dose dense Adriamycin and Cytoxan 4 followed by Taxol weekly 6discontinued for neuropathy 2. Followed by radiationstarted 09/26/2017-10/25/2018  --------------------------------------------------------------------------------------------------------------------------- Chemo-induced peripheral neuropathy: Currently on Lyrica because she could not tolerate Cymbalta.  I will refer her to physical therapy for transcutaneous electric nerve stimulation.  Severe fatigue: Related to chemotherapy but also due to deconditioning. I once again encouraged her to join the [employer] live strong program.  Breast cancer surveillance: 1.Breast exam 10/27/2019: Benign 2.mammogram 05/21/2019: Benign postlumpectomy radiation changes left breast, breast density category B  Return to clinic in one year

## 2019-10-29 ENCOUNTER — Telehealth: Payer: Self-pay | Admitting: Hematology and Oncology

## 2019-10-29 NOTE — Telephone Encounter (Signed)
Scheduled per los, patient has been called and notified. 

## 2019-11-17 ENCOUNTER — Ambulatory Visit: Payer: BC Managed Care – PPO | Attending: Family

## 2019-11-17 DIAGNOSIS — Z23 Encounter for immunization: Secondary | ICD-10-CM

## 2019-11-17 NOTE — Progress Notes (Signed)
   Covid-19 Vaccination Clinic  Name:  Audrey Peters    MRN: [id number] DOB: Aug 26, 1955  11/17/2019  [name] was observed post Covid-19 immunization for 15 minutes without incident. She was provided with Vaccine Information Sheet and instruction to access the V-Safe system.   [name] was instructed to call 911 with any severe reactions post vaccine: Marland Kitchen Difficulty breathing  . Swelling of face and throat  . A fast heartbeat  . A bad rash all over body  . Dizziness and weakness   Immunizations Administered    Name Date Dose VIS Date Route   Moderna COVID-19 Vaccine 11/17/2019 11:01 AM 0.5 mL 07/14/2019 Intramuscular   Manufacturer: Moderna   Lot: [id number]   [id number]

## 2020-02-01 ENCOUNTER — Other Ambulatory Visit: Payer: Self-pay | Admitting: Obstetrics & Gynecology

## 2020-03-30 ENCOUNTER — Ambulatory Visit: Payer: BC Managed Care – PPO | Admitting: Podiatry

## 2020-03-30 ENCOUNTER — Encounter: Payer: Self-pay | Admitting: Podiatry

## 2020-03-30 ENCOUNTER — Other Ambulatory Visit: Payer: Self-pay

## 2020-03-30 VITALS — BP 102/61 | HR 91

## 2020-03-30 DIAGNOSIS — G620 Drug-induced polyneuropathy: Secondary | ICD-10-CM

## 2020-03-30 DIAGNOSIS — M79674 Pain in right toe(s): Secondary | ICD-10-CM | POA: Diagnosis not present

## 2020-03-30 DIAGNOSIS — B351 Tinea unguium: Secondary | ICD-10-CM

## 2020-03-30 DIAGNOSIS — E119 Type 2 diabetes mellitus without complications: Secondary | ICD-10-CM

## 2020-03-30 DIAGNOSIS — T451X5A Adverse effect of antineoplastic and immunosuppressive drugs, initial encounter: Secondary | ICD-10-CM | POA: Diagnosis not present

## 2020-03-30 DIAGNOSIS — M79675 Pain in left toe(s): Secondary | ICD-10-CM

## 2020-03-30 DIAGNOSIS — L84 Corns and callosities: Secondary | ICD-10-CM | POA: Diagnosis not present

## 2020-03-30 DIAGNOSIS — Z794 Long term (current) use of insulin: Secondary | ICD-10-CM

## 2020-03-30 MED ORDER — NONFORMULARY OR COMPOUNDED ITEM: 3 refills | Status: DC

## 2020-03-30 NOTE — Progress Notes (Signed)
Subjective: Audrey Peters presents today referred by [name], MD for diabetic foot evaluation. Last visit was about one month ago per patient recall.  She is followed by Endocrinologist, Dr. Buddy Duty for her diabetes and she had her last visit with him 2 weeks ago.   Patient relates >10 year history of diabetes.  Patient denies any history of foot wounds.  Patient does admit to symptoms of pedal and hand numbness, tingling, burning, pins/needles sensations. She has diagnosed neuropathy after undergoing chemotherapy for breast cancer.  Today, patient c/o of painful, discolored, thick toenails which interfere with daily activities.  Pain is aggravated when wearing enclosed shoe gear. She also notes callus on right great toe. She has not attempted treatment, but instead, desires professional help with her condition due to her diabetes.  Past Medical History:  Diagnosis Date  . Anemia yrs ago  . Arthritis   . Breast cancer ([place])   . Cancer [facility])    recent dx in breast  . Carpal tunnel syndrome of right wrist   . Diabetes mellitus without complication ([place])    dx 2008  . Headache    sinus  . Hypertension   . Personal history of chemotherapy   . Personal history of radiation therapy   . Sleep apnea    does not use cpap  . Vaginal delivery 1983   Patient Active Problem List   Diagnosis Date Noted  . Chemotherapy-induced peripheral neuropathy ([place]) 01/22/2018  . Encounter for antineoplastic chemotherapy 06/07/2017  . Port-A-Cath in place 05/24/2017  . Genetic testing 05/23/2017  . Malignant neoplasm of upper-outer quadrant of left breast in female, estrogen receptor negative ([place]) 04/22/2017  . Essential hypertension 01/30/2015  . Diabetes mellitus ([place]) 01/30/2015    Past Surgical History:  Procedure Laterality Date  . BREAST BIOPSY    . BREAST LUMPECTOMY Left   . BREAST LUMPECTOMY WITH RADIOACTIVE SEED AND SENTINEL LYMPH NODE BIOPSY Left 04/30/2017   Procedure: LEFT  BREAST LUMPECTOMY WITH RADIOACTIVE SEED AND LEFT SENTINEL LYMPH NODE BIOPSY ERAS PATHWAY;  Surgeon: Erroll Luna, MD;  Location: [facility];  Service: General;  Laterality: Left;  . COLONOSCOPY WITH PROPOFOL N/A 05/28/2016   Procedure: COLONOSCOPY WITH PROPOFOL;  Surgeon: Garlan Fair, MD;  Location: [facility];  Service: Endoscopy;  Laterality: N/A;  . DILATION AND CURETTAGE OF UTERUS    . HYSTEROSCOPY WITH D & C N/A 07/21/2015   Procedure: DILATATION AND CURETTAGE /HYSTEROSCOPY with myosure;  Surgeon: Janyth Pupa, DO;  Location: [facility];  Service: Gynecology;  Laterality: N/A;  . PORTACATH PLACEMENT Right 04/30/2017   Procedure: INSERTION PORT-A-CATH;  Surgeon: Erroll Luna, MD;  Location: [facility];  Service: General;  Laterality: Right;    Current Outpatient Medications on File Prior to Visit  Medication Sig Dispense Refill  . acetaminophen (TYLENOL 8 HOUR ARTHRITIS PAIN) 650 MG CR tablet Take 650 mg by mouth every 8 (eight) hours as needed for pain.    Marland Kitchen atorvastatin (LIPITOR) 10 MG tablet Take 1 tablet (10 mg total) by mouth daily.    . clotrimazole (LOTRIMIN) 1 % cream Apply 1 application topically daily as needed (for irritated/itchy skin.).     Marland Kitchen clotrimazole-betamethasone (LOTRISONE) cream     . insulin regular human CONCENTRATED (HUMULIN R U-500 KWIKPEN) 500 UNIT/ML kwikpen Inject 50-120 Units into the skin 3 (three) times daily with meals. 120 units in the morning, 50 units at lunch and 50 units at supper.    . losartan (COZAAR) 100 MG tablet  Take 100 mg by mouth daily.    . medroxyPROGESTERone (PROVERA) 10 MG tablet Take 10 mg by mouth daily.    . metFORMIN (GLUCOPHAGE) 1000 MG tablet Take 1 tablet (1,000 mg total) by mouth daily with breakfast.    . metFORMIN (GLUCOPHAGE-XR) 500 MG 24 hr tablet Take 500 mg by mouth 2 (two) times daily.    . pregabalin (LYRICA) 150 MG capsule Take 1 capsule (150 mg total) by mouth 2 (two) times daily.    Marland Kitchen triamterene-hydrochlorothiazide  (MAXZIDE-25) 37.5-25 MG tablet Take 1 tablet by mouth daily.    . TRULICITY 1.5 VF/6.4PP SOPN SMARTSIG:0.5 Milliliter(s) SUB-Q Once a Week    . [DISCONTINUED] prochlorperazine (COMPAZINE) 10 MG tablet Take 1 tablet (10 mg total) by mouth every 6 (six) hours as needed (Nausea or vomiting). 30 tablet 1   No current facility-administered medications on file prior to visit.     Allergies  Allergen Reactions  . Invokana [Canagliflozin] Other (See Comments)    Caused a yeast infection  . Sulfa Antibiotics Itching and Other (See Comments)    Unsure of exact reaction type  . Ciprofloxacin Palpitations    Social History   Occupational History  . Not on file  Tobacco Use  . Smoking status: Never Smoker  . Smokeless tobacco: Never Used  Vaping Use  . Vaping Use: Never used  Substance and Sexual Activity  . Alcohol use: Yes    Alcohol/week: 0.0 standard drinks    Comment: occ wine  . Drug use: No  . Sexual activity: Not on file    Family History  Problem Relation Age of Onset  . Diabetes Mother   . Hypertension Mother     Immunization History  Administered Date(s) Administered  . Moderna SARS-COVID-2 Vaccination 10/15/2019, 11/17/2019    Review of systems: Positive Findings in bold print.  Constitutional:  chills, fatigue, fever, sweats, weight change Communication: Optometrist, sign Ecologist, hand writing, iPad/Android device Head: headaches, head injury Eyes: changes in vision, eye pain, glaucoma, cataracts, macular degeneration, diplopia, glare,  light sensitivity, eyeglasses or contacts, blindness Ears nose mouth throat: hearing impaired, hearing aids,  ringing in ears, deaf, sign language,  vertigo, nosebleeds,  rhinitis,  cold sores, snoring, swollen glands Cardiovascular: HTN, edema, arrhythmia, pacemaker in place, defibrillator in place, chest pain/tightness, chronic anticoagulation, blood clot, heart failure, MI Peripheral Vascular: leg cramps, varicose  veins, blood clots, lymphedema, varicosities Respiratory:  difficulty breathing, denies congestion, SOB, wheezing, cough, emphysema Gastrointestinal: change in appetite or weight, abdominal pain, constipation, diarrhea, nausea, vomiting, vomiting blood, change in bowel habits, abdominal pain, jaundice, rectal bleeding, hemorrhoids, GERD Genitourinary:  nocturia,  pain on urination, polyuria,  blood in urine, Foley catheter, urinary urgency, ESRD on hemodialysis Musculoskeletal: amputation, cramping, stiff joints, painful joints, decreased joint motion, fractures, OA, gout, hemiplegia, paraplegia, uses cane, wheelchair bound, uses walker, uses rollator Skin: changes in toenails, color change, dryness, itching, mole changes,  rash, wound(s) Neurological: headaches, numbness in feet, paresthesias in feet, burning in feet and hands, fainting,  seizures, change in speech,  headaches, memory problems/poor historian, cerebral palsy, weakness, paralysis, CVA, TIA Endocrine: diabetes, hypothyroidism, hyperthyroidism,  goiter, dry mouth, flushing, heat intolerance,  cold intolerance,  excessive thirst, denies polyuria,  nocturia Hematological:  easy bleeding, excessive bleeding, easy bruising, enlarged lymph nodes, on long term blood thinner, history of past transusions Allergy/immunological:  hives, eczema, frequent infections, multiple drug allergies, seasonal allergies, transplant recipient, multiple food allergies Psychiatric:  anxiety, depression, mood disorder, suicidal ideations, hallucinations,  insomnia  Objective: Vitals:   03/30/20 1122  BP: 102/61  Pulse: 91    Audrey Peters is a pleasant 64 y.o. female obese in NAD.Marland Kitchen AAO X 3.  Vascular Examination: Capillary refill time to digits immediate b/l. Palpable DP pulse(s) b/l lower extremities Nonpalpable PT pulse(s) b/l lower extremities secondary to ankle edema. Pedal hair sparse. Lower extremity skin temperature gradient within normal limits.  Nonpitting edema noted b/l lower extremities LLE>RLE.  Dermatological Examination: Pedal skin with normal turgor, texture and tone bilaterally. No open wounds bilaterally. No interdigital macerations bilaterally. Toenails 1-5 b/l elongated, discolored, dystrophic, thickened, crumbly with subungual debris and tenderness to dorsal palpation. Hyperkeratotic lesion(s) R hallux.  No erythema, no edema, no drainage, no flocculence.  Musculoskeletal Examination: Normal muscle strength 5/5 to all lower extremity muscle groups bilaterally. No pain crepitus or joint limitation noted with ROM b/l. Pes planus deformity noted b/l.  Patient ambulates independent of any assistive aids.  Footwear Assessment: Does the patient wear appropriate shoes? Yes. Does the patient need inserts/orthotics?  Yes.   Neurological Examination: Protective sensation intact 5/5 intact bilaterally with 10g monofilament b/l. Vibratory sensation diminished b/l. Proprioception intact bilaterally. Clonus negative b/l.  Assessment: 1. Pain due to onychomycosis of toenails of both feet   2. Callus   3. Chemotherapy-induced peripheral neuropathy ([place])   4. Type 2 diabetes mellitus without complication, with long-term current use of insulin ([place])   5. Encounter for diabetic foot exam ([place])     ADA Risk Categorization:  Low Risk:  Patient has all of the following: Intact protective sensation No prior foot ulcer  No severe deformity Pedal pulses present  Plan: -Examined patient. -Diabetic foot examination performed on today's visit. -Toenails 1-5 b/l were debrided in length and girth with sterile nail nippers and dremel without iatrogenic bleeding. Discussed topical, laser and oral medication. Patient opted for topical treatment with compounded medication. Rx written for nonformulary compounding topical antifungal: [facility]: Antifungal cream - Terbinafine 3%, Fluconazole 2%, Tea Tree Oil 5%, Urea 10%, Ibuprofen 2% in  DMSO Suspension #37ml. Apply to the affected nail(s) at bedtime. -Callus right great toe pared utilizing sterile scalpel blade without incident. -Patient to report any pedal injuries to medical professional immediately. -Patient to continue soft, supportive shoe gear daily. -Patient/POA to call should there be question/concern in the interim.  Return in about 3 months (around 06/30/2020) for diabetic nail trim.

## 2020-03-30 NOTE — Patient Instructions (Addendum)
[facility] Patient Line for antifungal toenail medication:  [phone]  Diabetes Mellitus and Foot Care Foot care is an important part of your health, especially when you have diabetes. Diabetes may cause you to have problems because of poor blood flow (circulation) to your feet and legs, which can cause your skin to:  Become thinner and drier.  Break more easily.  Heal more slowly.  Peel and crack. You may also have nerve damage (neuropathy) in your legs and feet, causing decreased feeling in them. This means that you may not notice minor injuries to your feet that could lead to more serious problems. Noticing and addressing any potential problems early is the best way to prevent future foot problems. How to care for your feet Foot hygiene  Wash your feet daily with warm water and mild soap. Do not use hot water. Then, pat your feet and the areas between your toes until they are completely dry. Do not soak your feet as this can dry your skin.  Trim your toenails straight across. Do not dig under them or around the cuticle. File the edges of your nails with an emery board or nail file.  Apply a moisturizing lotion or petroleum jelly to the skin on your feet and to dry, brittle toenails. Use lotion that does not contain alcohol and is unscented. Do not apply lotion between your toes. Shoes and socks  Wear clean socks or stockings every day. Make sure they are not too tight. Do not wear knee-high stockings since they may decrease blood flow to your legs.  Wear shoes that fit properly and have enough cushioning. Always look in your shoes before you put them on to be sure there are no objects inside.  To break in new shoes, wear them for just a few hours a day. This prevents injuries on your feet. Wounds, scrapes, corns, and calluses  Check your feet daily for blisters, cuts, bruises, sores, and redness. If you cannot see the bottom of your feet, use a mirror or ask someone  for help.  Do not cut corns or calluses or try to remove them with medicine.  If you find a minor scrape, cut, or break in the skin on your feet, keep it and the skin around it clean and dry. You may clean these areas with mild soap and water. Do not clean the area with peroxide, alcohol, or iodine.  If you have a wound, scrape, corn, or callus on your foot, look at it several times a day to make sure it is healing and not infected. Check for: ? Redness, swelling, or pain. ? Fluid or blood. ? Warmth. ? Pus or a bad smell. General instructions  Do not cross your legs. This may decrease blood flow to your feet.  Do not use heating pads or hot water bottles on your feet. They may burn your skin. If you have lost feeling in your feet or legs, you may not know this is happening until it is too late.  Protect your feet from hot and cold by wearing shoes, such as at the beach or on hot pavement.  Schedule a complete foot exam at least once a year (annually) or more often if you have foot problems. If you have foot problems, report any cuts, sores, or bruises to your health care provider immediately. Contact a health care provider if:  You have a medical condition that increases your risk of infection and you have any cuts, sores, or  bruises on your feet.  You have an injury that is not healing.  You have redness on your legs or feet.  You feel burning or tingling in your legs or feet.  You have pain or cramps in your legs and feet.  Your legs or feet are numb.  Your feet always feel cold.  You have pain around a toenail. Get help right away if:  You have a wound, scrape, corn, or callus on your foot and: ? You have pain, swelling, or redness that gets worse. ? You have fluid or blood coming from the wound, scrape, corn, or callus. ? Your wound, scrape, corn, or callus feels warm to the touch. ? You have pus or a bad smell coming from the wound, scrape, corn, or callus. ? You have  a fever. ? You have a red line going up your leg. Summary  Check your feet every day for cuts, sores, red spots, swelling, and blisters.  Moisturize feet and legs daily.  Wear shoes that fit properly and have enough cushioning.  If you have foot problems, report any cuts, sores, or bruises to your health care provider immediately.  Schedule a complete foot exam at least once a year (annually) or more often if you have foot problems. This information is not intended to replace advice given to you by your health care provider. Make sure you discuss any questions you have with your health care provider. Document Revised: 04/22/2019 Document Reviewed: 08/31/2016 Elsevier Patient Education  [place], Adult  Normally, a foot has a curve, called an arch, on its inner side. The arch creates a gap between the foot and the ground. Flat feet is a common condition in which one or both feet do not have an arch. What are the causes? This condition may be caused by:  Failure of a normal arch to develop during childhood.  An injury to tendons and ligaments in the foot, such as to the tendon that supports the arch (posterior tibial tendon).  Loose tendons or ligaments in the foot.  A wearing down of the arch over time.  Injury to bones in the foot.  An abnormality in the bones of the foot, called tarsal coalition. This happens when two or more bones in the foot are joined together (fused) before birth. What increases the risk? This condition is more likely to develop in:  Females.  Adults age 30 or older.  People who: ? Have a family history of flat feet. ? Have a history of childhood flexible flatfoot. ? Are obese. ? Have diabetes. ? Have high blood pressure. ? Participate in high-impact sports. ? Have inflammatory arthritis. ? Have a history of broken (fractured) or dislocated bones in the foot. What are the signs or symptoms? Symptoms of this condition  include:  Pain or tightness along the bottom of the foot.  Foot pain that gets worse with activity.  Swelling of the inner side of the foot.  Swelling of the ankle.  Pain on the outer side of the ankle.  Changes in the way that you walk (gait).  Pronation. This is when the foot and ankle lean inward when you are standing.  Bony bumps on the top or inner side of the foot. How is this diagnosed? This condition is diagnosed with a physical exam of your foot and ankle. Your health care provider may also:  Look at your shoes for patterns of wear on the soles.  Order  imaging tests, such as X-rays, a CT scan, or an MRI.  Refer you to a health care provider who specializes in feet (podiatrist) or a physical therapist. How is this treated? This condition may be treated with:  Stretching exercises or physical therapy. This helps to increase range of motion and relieve pain.  A shoe insert (orthotic). This helps to support the arch of your foot. Orthotics can be purchased from a store or can be custom-made by your health care provider.  Wearing shoes with appropriate arch support. This is especially important for athletes.  Medicines. These may be prescribed to relieve pain.  An ankle brace, boot, or cast. These may be used to relieve pressure on your foot. You may be given crutches if walking is painful.  Surgery. This may be done to improve the alignment of your foot. This is only needed if your posterior tibial tendon is torn or if you have tarsal coalition. Follow these instructions at home: Activity  Do any exercises as told by your health care provider.  If an activity causes pain, avoid it or try to find another activity that does not cause pain. General instructions  Wear orthotics and appropriate shoes as told by your health care provider.  Take over-the-counter and prescription medicines only as told by your health care provider.  Wear an ankle brace, boot, or cast  as told by your health care provider.  Use crutches as told by your health care provider.  Keep all follow-up visits as told by your health care provider. This is important. How is this prevented? To prevent the condition from getting worse:  Wear comfortable, supportive shoes that are appropriate for your activities.  Maintain a healthy weight.  Stay active in a way that your health care provider recommends. This will help to keep your feet flexible and strong.  Manage long-term (chronic) health conditions, such as diabetes, high blood pressure, and inflammatory arthritis.  Work with a health care provider if you have concerns about your feet or shoes. Contact a health care provider if:  You have pain in your foot or lower leg that gets worse or does not improve with medicine.  You have pain or difficulty when walking.  You have problems with your orthotics. Summary  Flat feet is a common condition in which one or both feet do not have a curve, called an arch, on the inner side.  Your health care provider may recommend a shoe insert (orthotic) or shoes with the appropriate arch support.  Other treatments may include stretching exercises or physical therapy, medicines to relieve pain, and wearing an ankle brace, boot, or cast.  Surgery may be done if you have a tear in the tendon that supports your arch (posterior tibial tendon) or if two or more of your foot bones were joined together (fused)  before birth (tarsal coalition). This information is not intended to replace advice given to you by your health care provider. Make sure you discuss any questions you have with your health care provider. Document Revised: 11/20/2018 Document Reviewed: 10/10/2016 Elsevier Patient Education  [place].   Onychomycosis/Fungal Toenails  WHAT IS IT? An infection that lies within the keratin of your nail plate that is caused by a fungus.  WHY ME? Fungal infections affect all ages,  sexes, races, and creeds.  There may be many factors that predispose you to a fungal infection such as age, coexisting medical conditions such as diabetes, or an autoimmune disease; stress, medications,  fatigue, genetics, etc.  Bottom line: fungus thrives in a warm, moist environment and your shoes offer such a location.  IS IT CONTAGIOUS? Theoretically, yes.  You do not want to share shoes, nail clippers or files with someone who has fungal toenails.  Walking around barefoot in the same room or sleeping in the same bed is unlikely to transfer the organism.  It is important to realize, however, that fungus can spread easily from one nail to the next on the same foot.  HOW DO WE TREAT THIS?  There are several ways to treat this condition.  Treatment may depend on many factors such as age, medications, pregnancy, liver and kidney conditions, etc.  It is best to ask your doctor which options are available to you.  11. No treatment.   Unlike many other medical concerns, you can live with this condition.  However for many people this can be a painful condition and may lead to ingrown toenails or a bacterial infection.  It is recommended that you keep the nails cut short to help reduce the amount of fungal nail. 12. Topical treatment.  These range from herbal remedies to prescription strength nail lacquers.  About 40-50% effective, topicals require twice daily application for approximately 9 to 12 months or until an entirely new nail has grown out.  The most effective topicals are medical grade medications available through physicians offices. 13. Oral antifungal medications.  With an 80-90% cure rate, the most common oral medication requires 3 to 4 months of therapy and stays in your system for a year as the new nail grows out.  Oral antifungal medications do require blood work to make sure it is a safe drug for you.  A liver function panel will be performed prior to starting the medication and after the first  month of treatment.  It is important to have the blood work performed to avoid any harmful side effects.  In general, this medication safe but blood work is required. 14. Laser Therapy.  This treatment is performed by applying a specialized laser to the affected nail plate.  This therapy is noninvasive, fast, and non-painful.  It is not covered by insurance and is therefore, out of pocket.  The results have been very good with a 80-95% cure rate.  [facility] is the only practice in the area to offer this therapy. 15. Permanent Nail Avulsion.  Removing the entire nail so that a new nail will not grow back.   Corns and Calluses Corns are small areas of thickened skin that occur on the top, sides, or tip of a toe. They contain a cone-shaped core with a point that can press on a nerve below. This causes pain.  Calluses are areas of thickened skin that can occur anywhere on the body, including the hands, fingers, palms, soles of the feet, and heels. Calluses are usually larger than corns. What are the causes? Corns and calluses are caused by rubbing (friction) or pressure, such as from shoes that are too tight or do not fit properly. What increases the risk? Corns are more likely to develop in people who have misshapen toes (toe deformities), such as hammer toes. Calluses can occur with friction to any area of the skin. They are more likely to develop in people who:  Work with their hands.  Wear shoes that fit poorly, are too tight, or are high-heeled.  Have toe deformities. What are the signs or symptoms? Symptoms of a corn or  callus include:  A hard growth on the skin.  Pain or tenderness under the skin.  Redness and swelling.  Increased discomfort while wearing tight-fitting shoes, if your feet are affected. If a corn or callus becomes infected, symptoms may include:  Redness and swelling that gets worse.  Pain.  Fluid, blood, or pus draining from the corn or callus. How  is this diagnosed? Corns and calluses may be diagnosed based on your symptoms, your medical history, and a physical exam. How is this treated? Treatment for corns and calluses may include:  Removing the cause of the friction or pressure. This may involve: ? Changing your shoes. ? Wearing shoe inserts (orthotics) or other protective layers in your shoes, such as a corn pad. ? Wearing gloves.  Applying medicine to the skin (topical medicine) to help soften skin in the hardened, thickened areas.  Removing layers of dead skin with a file to reduce the size of the corn or callus.  Removing the corn or callus with a scalpel or laser.  Taking antibiotic medicines, if your corn or callus is infected.  Having surgery, if a toe deformity is the cause. Follow these instructions at home:   Take over-the-counter and prescription medicines only as told by your health care provider.  If you were prescribed an antibiotic, take it as told by your health care provider. Do not stop taking it even if your condition starts to improve.  Wear shoes that fit well. Avoid wearing high-heeled shoes and shoes that are too tight or too loose.  Wear any padding, protective layers, gloves, or orthotics as told by your health care provider.  Soak your hands or feet and then use a file or pumice stone to soften your corn or callus. Do this as told by your health care provider.  Check your corn or callus every day for symptoms of infection. Contact a health care provider if you:  Notice that your symptoms do not improve with treatment.  Have redness or swelling that gets worse.  Notice that your corn or callus becomes painful.  Have fluid, blood, or pus coming from your corn or callus.  Have new symptoms. Summary  Corns are small areas of thickened skin that occur on the top, sides, or tip of a toe.  Calluses are areas of thickened skin that can occur anywhere on the body, including the hands, fingers,  palms, and soles of the feet. Calluses are usually larger than corns.  Corns and calluses are caused by rubbing (friction) or pressure, such as from shoes that are too tight or do not fit properly.  Treatment may include wearing any padding, protective layers, gloves, or orthotics as told by your health care provider. This information is not intended to replace advice given to you by your health care provider. Make sure you discuss any questions you have with your health care provider. Document Revised: 11/19/2018 Document Reviewed: 06/12/2017 Elsevier Patient Education  2020 [employer].

## 2020-04-12 ENCOUNTER — Other Ambulatory Visit: Payer: Self-pay

## 2020-04-12 ENCOUNTER — Encounter (HOSPITAL_BASED_OUTPATIENT_CLINIC_OR_DEPARTMENT_OTHER): Payer: Self-pay | Admitting: Obstetrics & Gynecology

## 2020-04-14 ENCOUNTER — Encounter (HOSPITAL_BASED_OUTPATIENT_CLINIC_OR_DEPARTMENT_OTHER)
Admission: RE | Admit: 2020-04-14 | Discharge: 2020-04-14 | Disposition: A | Discharge status: Home / Self Care | Payer: BC Managed Care – PPO | Source: Ambulatory Visit | Attending: Obstetrics & Gynecology | Admitting: Obstetrics & Gynecology

## 2020-04-14 DIAGNOSIS — Z01818 Encounter for other preprocedural examination: Secondary | ICD-10-CM | POA: Diagnosis not present

## 2020-04-14 LAB — BASIC METABOLIC PANEL
Anion gap: 10 (ref 5–15)
BUN: 18 mg/dL (ref 8–23)
CO2: 27 mmol/L (ref 22–32)
Calcium: 9.3 mg/dL (ref 8.9–10.3)
Chloride: 103 mmol/L (ref 98–111)
Creatinine, Ser: 1.07 mg/dL — ABNORMAL HIGH (ref 0.44–1.00)
GFR calc Af Amer: >60 mL/min (ref >60)
GFR calc non Af Amer: 55 mL/min — ABNORMAL LOW (ref >60)
Glucose, Bld: 295 mg/dL — ABNORMAL HIGH (ref 70–99)
Potassium: 3.9 mmol/L (ref 3.5–5.1)
Sodium: 140 mmol/L (ref 135–145)

## 2020-04-14 NOTE — Progress Notes (Signed)
Glucose-295, Dr. Sabra Heck aware. Will recheck day of surgery. Notified Dr. Annie Main office.

## 2020-04-16 ENCOUNTER — Other Ambulatory Visit (HOSPITAL_COMMUNITY)
Admission: RE | Admit: 2020-04-16 | Discharge: 2020-04-16 | Disposition: A | Discharge status: Home / Self Care | Payer: BC Managed Care – PPO | Source: Ambulatory Visit | Attending: Obstetrics & Gynecology | Admitting: Obstetrics & Gynecology

## 2020-04-16 DIAGNOSIS — Z20822 Contact with and (suspected) exposure to covid-19: Secondary | ICD-10-CM | POA: Insufficient documentation

## 2020-04-16 DIAGNOSIS — Z01812 Encounter for preprocedural laboratory examination: Secondary | ICD-10-CM | POA: Diagnosis not present

## 2020-04-16 LAB — SARS CORONAVIRUS 2 (TAT 6-24 HRS): SARS Coronavirus 2: NEGATIVE

## 2020-04-16 NOTE — H&P (Signed)
64yo PM female with breast Ca who presents for preop appt for upcoming HSC,D&C, polypectomy due to postmenopausal bleeding  In review, noted daily bleeding- using about 2-3 pads per day with moderate bleeding. In review, since around Jan/Feb she has had monthly bleeding. It will usually last for about 2-3 days- light pink spotting. Usually requires one pad per day. Prior to bleeding she will note some bloating and cramping. Bleed has been happening every month around the same time each month. In July 2021 started on provera daily. She notes that the medication did improve her bleeding and it resolved for a little. Then early August, she did have some light spotting for about a week- not as heavy as before and then resolved. No bleeding since that time.         SHG/US (02/16/20): 8.2cm anteverted uterus with fibroids- largest submucosal 2.1cm. ?Anterior submucosal fibroid protruding into canal. Ovaries normal bilateral. Following saline infusion- 2.4x1.6x1.9cm mass within endometrium. Wall surrounding appear thin        Of note, pt previously had PMB back in 2016- [place], D&C completed- benign findings and has not had bleeding for several years         -Vaginal atrophy: Previously on estrogen- discontinued due to breast Ca. She is not currently sexually active. Still notes occasional itching. Denies irregular discharge or dryness. Denies pelvic or abdominal pain. 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     Current Medications  Taking  .Atorvastatin Calcium 10 MG Tablet 1 tablet Orally Once a day    .HumuLIN R U-500 KwikPen(Insulin Regular Human (Conc)) 500 UNIT/ML Solution Pen-injector 150 units before breakfast, 80 units before lunch, 80 units before evening meal Subcutaneous three times a day    .metFORMIN HCl ER 500 MG Tablet Extended Release 24 Hour 2  tablets with evening meal Orally Once a day    .Trulicity(Dulaglutide) 3 MG/0.5ML Solution Pen-injector 3 mg Subcutaneous once a week    .Lyrica(Pregabalin) 150 MG Capsule 1 capsule Orally Twice a day    .medroxyPROGESTERone Acetate 10 MG Tablet 1 tablet with food Orally Once a day    .Maxzide-25(Triamterene-HCTZ) 37.5-25 MG Tablet 1 tablet Orally Once a day    .Cozaar(Losartan Potassium) 100 MG Tablet 1 tablet Orally Once a day    .OneTouch Verio(Blood Glucose Test) - Strip use to check blood sugar In Vitro twice a day DX: E11.9    .BD Pen Needle Short U/F(Insulin Pen Needle) 31G X 8 MM Miscellaneous USE 3 TIMES DAILY.     Marland KitchenB12 Folate 800-800 MCG Capsule as directed Orally     .Lotrisone(Clotrimazole-Betamethasone) 1-0.05 % Cream APPLY TO AFFECTED AREA TWICE A DAY.     Medication List reviewed and reconciled with the patient       Past Medical History        Type 2 diabetes mellitus.       Hypercholesterolemia.       Hypertension.     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     Obesity.      Obstructive sleep apnea, non compliant with cpap.       Breast Cancer Dr Lindi Adie.       Malignant neoplasm of upper-outer quadrant of left female breast.       LBP, lumbar spinal stenosis Dr Ron Agee.    Surgical  History        tonsillectomy         hysteroscopy D&C 07/21/15        Lumpectomy left breast 03/2017      Family History   Father: deceased, diagnosed with Coronary artery disease  Mother: deceased, diagnosed with Diabetes, Hypertension  Brother 1: alive  1 brother(s) . 1 son(s) .   denies any GYN family cancer hx.    Social History  General:          Tobacco use               cigarettes:  Never smoked             Tobacco history last updated  04/12/2020        Marital Status: married, Married.         OCCUPATION: Pharmacist, hospital, retired, formerly at [employer].         no Smoking.      Gyn History  Sexual activity not currently sexually active.    Periods : postmenopausal.   Denies H/O LMP.   Denies H/O Birth control.   Last pap smear date 12/2017 neg .   Last mammogram date 03/2017.   Denies H/O Abnormal pap smear.   GYN procedures 07/2015- HSC, D and C.      OB History  Number of pregnancies  1.   Pregnancy # 1  live birth, vaginal delivery, boy.       Allergies  Sulfa drugs (for allergy)  Cipro: palpitation  Invokana: yeast infections - Side Effects  Jardiance: yeast infections     Hospitalization/Major Diagnostic Procedure  childbirth   None this past yr 11/2017      Review of Systems  CONSTITUTIONAL:          no Appetite changes.  no Chills.  Fatigue yes.  no Fever.  Night sweats yes.      CARDIOLOGY:          no Chest pain.      RESPIRATORY:          no Shortness of breath.  no Cough.      UROLOGY:          no Urinary urgency.  Urinary frequency yes.  no Urinary incontinence.  no Dysuria.      GASTROENTEROLOGY:          no Abdominal pain.  Bloating/belching yes.  no Change in bowel habits.  no Change in bowel movements.      FEMALE REPRODUCTIVE:          no Abnormal vaginal discharge.  no Breast lumps or discharge.  no Breast pain.  no Hot flashes.  no Sexual problems.  no Vaginal irritation.  no Vaginal itching.      NEUROLOGY:          no Dizziness.  no Headache.  Tingling/numbness yes.      PSYCHOLOGY:          no Anxiety.  no Depression.      SKIN:          no Rash.  no Hives.      HEMATOLOGY/LYMPH:          no Anemia.  Using Blood Thinners no.          Exam in office: Vital Signs  Wt 241.3, Wt change 3.3 lb, Ht 64.5, BMI 40.77, Pulse sitting 73, BP sitting 124/86.    Examination  General Examination:  CONSTITUTIONAL: well developed, well nourished.         SKIN: warm and dry, no rashes.         NECK: supple, normal appearance.         LUNGS: clear to auscultation bilaterally, no wheezes, rhonchi, rales.         HEART: regular rate and rhythm.         ABDOMEN:  soft and not tender, no  rebound, no rigidity.         MUSCULOSKELETAL  no calf tenderness bilaterally.         EXTREMITIES:  no edema present.         PSYCH:  appropriate mood and affect.     A/P: 64yo PM female who presents for hysteroscopy, D&C, myosure polypectomy due to postmenopausal bleeding and suspected uterine polyp -NPO -LR @ 125cc/hr -SCDs to OR -Risk/benefit and alternatives reviewed with patient discussed risk of bleeding, infection and injury due to uterine perforation.  Questions and concerns were addressed and she desires to proceed  Janyth Pupa, DO [phone] (cell) [phone] (office)

## 2020-04-20 ENCOUNTER — Encounter (HOSPITAL_BASED_OUTPATIENT_CLINIC_OR_DEPARTMENT_OTHER): Payer: Self-pay | Admitting: Obstetrics & Gynecology

## 2020-04-20 ENCOUNTER — Ambulatory Visit (HOSPITAL_BASED_OUTPATIENT_CLINIC_OR_DEPARTMENT_OTHER)
Admission: RE | Admit: 2020-04-20 | Discharge: 2020-04-20 | Disposition: A | Discharge status: Home / Self Care | Payer: BC Managed Care – PPO | Attending: Obstetrics & Gynecology | Admitting: Obstetrics & Gynecology

## 2020-04-20 ENCOUNTER — Ambulatory Visit (HOSPITAL_BASED_OUTPATIENT_CLINIC_OR_DEPARTMENT_OTHER): Payer: BC Managed Care – PPO | Admitting: Certified Registered"

## 2020-04-20 ENCOUNTER — Other Ambulatory Visit: Payer: Self-pay

## 2020-04-20 ENCOUNTER — Encounter (HOSPITAL_BASED_OUTPATIENT_CLINIC_OR_DEPARTMENT_OTHER)
Admission: RE | Disposition: A | Discharge status: Home / Self Care | Payer: Self-pay | Source: Home / Self Care | Attending: Obstetrics & Gynecology

## 2020-04-20 DIAGNOSIS — Z794 Long term (current) use of insulin: Secondary | ICD-10-CM | POA: Diagnosis not present

## 2020-04-20 DIAGNOSIS — G4733 Obstructive sleep apnea (adult) (pediatric): Secondary | ICD-10-CM | POA: Insufficient documentation

## 2020-04-20 DIAGNOSIS — Z79899 Other long term (current) drug therapy: Secondary | ICD-10-CM | POA: Insufficient documentation

## 2020-04-20 DIAGNOSIS — Z9221 Personal history of antineoplastic chemotherapy: Secondary | ICD-10-CM | POA: Insufficient documentation

## 2020-04-20 DIAGNOSIS — E7800 Pure hypercholesterolemia, unspecified: Secondary | ICD-10-CM | POA: Insufficient documentation

## 2020-04-20 DIAGNOSIS — Z6841 Body Mass Index (BMI) 40.0 and over, adult: Secondary | ICD-10-CM | POA: Insufficient documentation

## 2020-04-20 DIAGNOSIS — E669 Obesity, unspecified: Secondary | ICD-10-CM | POA: Diagnosis not present

## 2020-04-20 DIAGNOSIS — N950 Postmenopausal bleeding: Secondary | ICD-10-CM | POA: Diagnosis present

## 2020-04-20 DIAGNOSIS — M1990 Unspecified osteoarthritis, unspecified site: Secondary | ICD-10-CM | POA: Diagnosis not present

## 2020-04-20 DIAGNOSIS — I10 Essential (primary) hypertension: Secondary | ICD-10-CM | POA: Insufficient documentation

## 2020-04-20 DIAGNOSIS — E119 Type 2 diabetes mellitus without complications: Secondary | ICD-10-CM | POA: Insufficient documentation

## 2020-04-20 DIAGNOSIS — N840 Polyp of corpus uteri: Secondary | ICD-10-CM | POA: Insufficient documentation

## 2020-04-20 DIAGNOSIS — Z923 Personal history of irradiation: Secondary | ICD-10-CM | POA: Diagnosis not present

## 2020-04-20 DIAGNOSIS — Z853 Personal history of malignant neoplasm of breast: Secondary | ICD-10-CM | POA: Insufficient documentation

## 2020-04-20 HISTORY — PX: DILATATION & CURETTAGE/HYSTEROSCOPY WITH MYOSURE: SHX6511

## 2020-04-20 LAB — GLUCOSE, CAPILLARY
Glucose-Capillary: 181 mg/dL — ABNORMAL HIGH (ref 70–99)
Glucose-Capillary: 166 mg/dL — ABNORMAL HIGH (ref 70–99)

## 2020-04-20 SURGERY — DILATATION & CURETTAGE/HYSTEROSCOPY WITH MYOSURE
Anesthesia: General | Site: Vagina

## 2020-04-20 MED ORDER — PROPOFOL 10 MG/ML IV BOLUS
INTRAVENOUS | Status: AC
  Filled 2020-04-20: qty 40

## 2020-04-20 MED ORDER — AMISULPRIDE (ANTIEMETIC) 5 MG/2ML IV SOLN: 10.0000 mg | Freq: Once | INTRAVENOUS | Status: DC | PRN

## 2020-04-20 MED ORDER — DEXAMETHASONE SODIUM PHOSPHATE 10 MG/ML IJ SOLN
INTRAMUSCULAR | Status: DC | PRN
  Administered 2020-04-20: 5 mg via INTRAVENOUS

## 2020-04-20 MED ORDER — FENTANYL CITRATE (PF) 100 MCG/2ML IJ SOLN
INTRAMUSCULAR | Status: AC
  Filled 2020-04-20: qty 2

## 2020-04-20 MED ORDER — FENTANYL CITRATE (PF) 100 MCG/2ML IJ SOLN
INTRAMUSCULAR | Status: DC | PRN
  Administered 2020-04-20: 25 ug via INTRAVENOUS

## 2020-04-20 MED ORDER — LIDOCAINE-EPINEPHRINE 1 %-1:100000 IJ SOLN
INTRAMUSCULAR | Status: DC | PRN
  Administered 2020-04-20: 20 mL

## 2020-04-20 MED ORDER — OXYCODONE HCL 5 MG PO TABS
5.0000 mg | ORAL_TABLET | Freq: Once | ORAL | Status: AC | PRN
  Administered 2020-04-20: 5 mg via ORAL

## 2020-04-20 MED ORDER — PROPOFOL 10 MG/ML IV BOLUS
INTRAVENOUS | Status: DC | PRN
  Administered 2020-04-20: 200 mg via INTRAVENOUS

## 2020-04-20 MED ORDER — LACTATED RINGERS IV SOLN: INTRAVENOUS | Status: DC

## 2020-04-20 MED ORDER — PHENYLEPHRINE HCL (PRESSORS) 10 MG/ML IV SOLN
INTRAVENOUS | Status: DC | PRN
  Administered 2020-04-20 (×3): 80 ug via INTRAVENOUS

## 2020-04-20 MED ORDER — POVIDONE-IODINE 10 % EX SWAB: 2.0000 "application " | Freq: Once | CUTANEOUS | Status: DC

## 2020-04-20 MED ORDER — MIDAZOLAM HCL 5 MG/5ML IJ SOLN
INTRAMUSCULAR | Status: DC | PRN
  Administered 2020-04-20: 1 mg via INTRAVENOUS

## 2020-04-20 MED ORDER — MEPERIDINE HCL 25 MG/ML IJ SOLN: 6.2500 mg | INTRAMUSCULAR | Status: DC | PRN

## 2020-04-20 MED ORDER — LIDOCAINE HCL (CARDIAC) PF 100 MG/5ML IV SOSY
PREFILLED_SYRINGE | INTRAVENOUS | Status: DC | PRN
  Administered 2020-04-20: 80 mg via INTRAVENOUS

## 2020-04-20 MED ORDER — ONDANSETRON HCL 4 MG/2ML IJ SOLN
INTRAMUSCULAR | Status: DC | PRN
  Administered 2020-04-20: 4 mg via INTRAVENOUS

## 2020-04-20 MED ORDER — GLYCOPYRROLATE 0.2 MG/ML IJ SOLN
INTRAMUSCULAR | Status: DC | PRN
  Administered 2020-04-20: .2 mg via INTRAVENOUS

## 2020-04-20 MED ORDER — OXYCODONE HCL 5 MG/5ML PO SOLN: 5.0000 mg | Freq: Once | ORAL | Status: AC | PRN

## 2020-04-20 MED ORDER — LIDOCAINE-EPINEPHRINE 1 %-1:100000 IJ SOLN
INTRAMUSCULAR | Status: AC
  Filled 2020-04-20: qty 1

## 2020-04-20 MED ORDER — LIDOCAINE HCL (PF) 1 % IJ SOLN
INTRAMUSCULAR | Status: AC
  Filled 2020-04-20: qty 30

## 2020-04-20 MED ORDER — PROMETHAZINE HCL 25 MG/ML IJ SOLN: 6.2500 mg | INTRAMUSCULAR | Status: DC | PRN

## 2020-04-20 MED ORDER — SILVER NITRATE-POT NITRATE 75-25 % EX MISC
CUTANEOUS | Status: AC
  Filled 2020-04-20: qty 10

## 2020-04-20 MED ORDER — OXYCODONE HCL 5 MG PO TABS
ORAL_TABLET | ORAL | Status: AC
  Filled 2020-04-20: qty 1

## 2020-04-20 MED ORDER — HYDROMORPHONE HCL 1 MG/ML IJ SOLN: 0.2500 mg | INTRAMUSCULAR | Status: DC | PRN

## 2020-04-20 MED ORDER — SODIUM CHLORIDE 0.9 % IR SOLN
Status: DC | PRN
  Administered 2020-04-20: 3000 mL

## 2020-04-20 MED ORDER — MIDAZOLAM HCL 2 MG/2ML IJ SOLN
INTRAMUSCULAR | Status: AC
  Filled 2020-04-20: qty 2

## 2020-04-20 SURGICAL SUPPLY — 18 items
CATH ROBINSON RED A/P 16FR (CATHETERS) ×1 IMPLANT
DEVICE MYOSURE LITE (MISCELLANEOUS) IMPLANT
DEVICE MYOSURE REACH (MISCELLANEOUS) ×1 IMPLANT
DILATOR CANAL MILEX (MISCELLANEOUS) IMPLANT
GAUZE 4X4 16PLY RFD (DISPOSABLE) ×2 IMPLANT
GLOVE BIOGEL PI IND STRL 6.5 (GLOVE) ×1 IMPLANT
GLOVE BIOGEL PI IND STRL 7.0 (GLOVE) ×1 IMPLANT
GLOVE BIOGEL PI INDICATOR 6.5 (GLOVE) ×1
GLOVE BIOGEL PI INDICATOR 7.0 (GLOVE) ×1
GLOVE ECLIPSE 6.5 STRL STRAW (GLOVE) ×2 IMPLANT
GOWN STRL REUS W/TWL LRG LVL3 (GOWN DISPOSABLE) ×4 IMPLANT
KIT PROCEDURE FLUENT (KITS) ×2 IMPLANT
PACK VAGINAL MINOR WOMEN LF (CUSTOM PROCEDURE TRAY) ×2 IMPLANT
PAD OB MATERNITY 4.3X12.25 (PERSONAL CARE ITEMS) ×2 IMPLANT
PAD PREP 24X48 CUFFED NSTRL (MISCELLANEOUS) ×2 IMPLANT
SEAL ROD LENS SCOPE MYOSURE (ABLATOR) ×2 IMPLANT
SLEEVE SCD COMPRESS KNEE MED (MISCELLANEOUS) ×2 IMPLANT
TOWEL GREEN STERILE FF (TOWEL DISPOSABLE) ×4 IMPLANT

## 2020-04-20 NOTE — Anesthesia Procedure Notes (Signed)
Procedure Name: LMA Insertion Date/Time: 04/20/2020 8:35 AM Performed by: Lavonia Dana, CRNA Pre-anesthesia Checklist: Patient identified, Emergency Drugs available, Suction available and Patient being monitored Patient Re-evaluated:Patient Re-evaluated prior to induction Oxygen Delivery Method: Circle system utilized Preoxygenation: Pre-oxygenation with 100% oxygen Induction Type: IV induction Ventilation: Mask ventilation without difficulty LMA: LMA inserted LMA Size: 4.0 Number of attempts: 1 Airway Equipment and Method: Bite block Placement Confirmation: positive ETCO2 Tube secured with: Tape Dental Injury: Teeth and Oropharynx as per pre-operative assessment

## 2020-04-20 NOTE — Anesthesia Preprocedure Evaluation (Signed)
Anesthesia Evaluation  Patient identified by MRN, date of birth, ID band Patient awake    Reviewed: Allergy & Precautions, NPO status , Patient's Chart, lab work & pertinent test results  Airway Mallampati: III  TM Distance: >3 FB Neck ROM: Full    Dental  (+) Dental Advisory Given   Pulmonary sleep apnea ,    breath sounds clear to auscultation       Cardiovascular hypertension, Pt. on medications  Rhythm:Regular Rate:Normal     Neuro/Psych  Headaches,    GI/Hepatic negative GI ROS, Neg liver ROS,   Endo/Other  diabetes, Type 2, Insulin DependentMorbid obesity  Renal/GU negative Renal ROS     Musculoskeletal  (+) Arthritis , Osteoarthritis,    Abdominal (+) + obese,   Peds  Hematology negative hematology ROS (+)   Anesthesia Other Findings   Reproductive/Obstetrics                             Lab Results  Component Value Date   WBC 3.1 (L) 09/13/2017   HGB 8.9 (L) 09/13/2017   HCT 27.4 (L) 09/13/2017   MCV 91.0 09/13/2017   PLT 204 09/13/2017   Lab Results  Component Value Date   CREATININE 1.07 (H) 04/14/2020   BUN 18 04/14/2020   NA 140 04/14/2020   K 3.9 04/14/2020   CL 103 04/14/2020   CO2 27 04/14/2020    Anesthesia Physical  Anesthesia Plan  ASA: III  Anesthesia Plan: General   Post-op Pain Management:    Induction: Intravenous  PONV Risk Score and Plan: 3 and Ondansetron, Dexamethasone, Midazolam and Treatment may vary due to age or medical condition  Airway Management Planned: LMA  Additional Equipment:   Intra-op Plan:   Post-operative Plan: Extubation in OR  Informed Consent: I have reviewed the patients History and Physical, chart, labs and discussed the procedure including the risks, benefits and alternatives for the proposed anesthesia with the patient or authorized representative who has indicated his/her understanding and acceptance.      Dental advisory given  Plan Discussed with: CRNA  Anesthesia Plan Comments:         Anesthesia Quick Evaluation

## 2020-04-20 NOTE — Op Note (Signed)
Operative Report  PreOp: Postmenopausal bleeding, uterine polyp PostOp: same Procedure:  Hysteroscopy, Dilation and Curettage, Myosure polypectomy Surgeon: Dr. Janyth Pupa Anesthesia: General, cervical block Complications:none EBL: 5cc UOP: 50cc IVF:500cc Discrepancy: 105cc  Findings: 8cm anteverted uterus with thickened endometrium- ~ 2cm polyp, possible fibroid noted anterior wall of uterine cavity, both ostia visualized  Specimens: 1) endometrial curettings with polyp  Procedure: The patient was taken to the operating room where she underwent general anesthesia without difficulty. The patient was placed in a low lithotomy position using Allen stirrups. She was then prepped and draped in the normal sterile fashion. The bladder was drained using a red rubber urethral catheter. A sterile speculum was inserted into the vagina. Cervical block was completed using 1% lidocaine with epinephrine.  A single tooth tenaculum was placed on the anterior lip of the cervix. The uterus was then sounded to 8cm. Cervix appeared dilated.  The diagnostic hysteroscope was then inserted without difficulty and noted to have the findings as listed above. Visualization was achieved using NS as a distending medium. The myosure was then used for resection of the polyp as well as some of the thickened lining.  The hysteroscope was removed and sharp curettage was performed. The tissue was sent to pathology. The hysteroscope was reinserted, no uterine perforation was seen. All instrument were then removed. Hemostasis was observed at the cervical site.  The patient was repositioned to the supine position. The patient tolerated the procedure without any complications and taken to recovery in stable condition.   Janyth Pupa, DO [phone] (pager) [phone] (office)

## 2020-04-20 NOTE — Anesthesia Postprocedure Evaluation (Signed)
Anesthesia Post Note  Patient: Audrey Peters  Procedure(s) Performed: DILATATION & CURETTAGE/HYSTEROSCOPY WITH MYOSURE (N/A Vagina )     Patient location during evaluation: PACU Anesthesia Type: General Level of consciousness: awake and alert Pain management: pain level controlled Vital Signs Assessment: post-procedure vital signs reviewed and stable Respiratory status: spontaneous breathing, nonlabored ventilation and respiratory function stable Cardiovascular status: blood pressure returned to baseline and stable Postop Assessment: no apparent nausea or vomiting Anesthetic complications: no   No complications documented.  Last Vitals:  Vitals:   04/20/20 0915 04/20/20 1006  BP: 116/74 121/80  Pulse: 95 78  Resp: 19 18  Temp:  36.7 C  SpO2: 96% 100%    Last Pain:  Vitals:   04/20/20 1006  TempSrc:   PainSc: 3                  Lynda Rainwater

## 2020-04-20 NOTE — Interval H&P Note (Signed)
History and Physical Interval Note:  04/20/2020 7:54 AM  Alton Revere  has presented today for surgery, with the diagnosis of N95.0 Post Menopausal Bleeding.  The various methods of treatment have been discussed with the patient and family. After consideration of risks, benefits and other options for treatment, the patient has consented to  Procedure(s): [place] (N/A) as a surgical intervention.  The patient's history has been reviewed, patient examined, no change in status, stable for surgery.  I have reviewed the patient's chart and labs.  Questions were answered to the patient's satisfaction.     Annalee Genta

## 2020-04-20 NOTE — Transfer of Care (Signed)
Immediate Anesthesia Transfer of Care Note  Patient: Audrey Peters  Procedure(s) Performed: DILATATION & CURETTAGE/HYSTEROSCOPY WITH MYOSURE (N/A Vagina )  Patient Location: PACU  Anesthesia Type:General  Level of Consciousness: awake, alert  and oriented  Airway & Oxygen Therapy: Patient Spontanous Breathing and Patient connected to face mask oxygen  Post-op Assessment: Report given to RN and Post -op Vital signs reviewed and stable  Post vital signs: Reviewed and stable  Last Vitals:  Vitals Value Taken Time  BP 109/65 04/20/20 0904  Temp    Pulse 95 04/20/20 0906  Resp 20 04/20/20 0906  SpO2 100 % 04/20/20 0906  Vitals shown include unvalidated device data.  Last Pain:  Vitals:   04/20/20 0749  TempSrc: Oral  PainSc: 0-No pain      Patients Stated Pain Goal: 3 (96/78/93 8101)  Complications: No complications documented.

## 2020-04-20 NOTE — Discharge Instructions (Addendum)
HOME INSTRUCTIONS  Please note any unusual or excessive bleeding, pain, swelling. Mild dizziness or drowsiness are normal for about 24 hours after surgery.   Shower when comfortable  Restrictions: No driving for 24 hours or while taking pain medications.  Activity:  No heavy lifting (>20 lbs), nothing in vagina (no tampons, douching, or intercourse) x 2 weeks; no tub baths for 2 weeks Vaginal spotting is expected but if your bleeding is heavy, period like,  please call the office   Diet:  You may return to your regular diet.  Do not eat large meals.  Eat small frequent meals throughout the day.  Continue to drink a good amount of water at least 6-8 glasses of water per day, hydration is very important for the healing process. Post Anesthesia Home Care Instructions  Activity: Get plenty of rest for the remainder of the day. A responsible individual must stay with you for 24 hours following the procedure.  For the next 24 hours, DO NOT: -Drive a car -Paediatric nurse -Drink alcoholic beverages -Take any medication unless instructed by your physician -Make any legal decisions or sign important papers.  Meals: Start with liquid foods such as gelatin or soup. Progress to regular foods as tolerated. Avoid greasy, spicy, heavy foods. If nausea and/or vomiting occur, drink only clear liquids until the nausea and/or vomiting subsides. Call your physician if vomiting continues.  Special Instructions/Symptoms: Your throat may feel dry or sore from the anesthesia or the breathing tube placed in your throat during surgery. If this causes discomfort, gargle with warm salt water. The discomfort should disappear within 24 hours.  If you had a scopolamine patch placed behind your ear for the management of post- operative nausea and/or vomiting:  1. The medication in the patch is effective for 72 hours, after which it should be removed.  Wrap patch in a tissue and discard in the trash. Wash hands  thoroughly with soap and water. 2. You may remove the patch earlier than 72 hours if you experience unpleasant side effects which may include dry mouth, dizziness or visual disturbances. 3. Avoid touching the patch. Wash your hands with soap and water after contact with the patch.      Pain Management: Take Motrin and/or tylenol as needed for pain.  Always take prescription pain medication with food, it may cause constipation, increase fluids and fiber and you may want to take an over-the-counter stool softener like Colace as needed up to 2x a day.    Alcohol -- Avoid for 24 hours and while taking pain medications.  Nausea: Take sips of ginger ale or soda  Fever -- Call physician if temperature over 101 degrees  Follow up:  If you do not already have a follow up appointment scheduled, please call the office at [phone].  If you experience fever (a temperature greater than 100.4), pain unrelieved by pain medication, shortness of breath, swelling of a single leg, or any other symptoms which are concerning to you please the office immediately.   Post Anesthesia Home Care Instructions  Activity: Get plenty of rest for the remainder of the day. A responsible individual must stay with you for 24 hours following the procedure.  For the next 24 hours, DO NOT: -Drive a car -Paediatric nurse -Drink alcoholic beverages -Take any medication unless instructed by your physician -Make any legal decisions or sign important papers.  Meals: Start with liquid foods such as gelatin or soup. Progress to regular foods as tolerated. Avoid greasy, spicy,  heavy foods. If nausea and/or vomiting occur, drink only clear liquids until the nausea and/or vomiting subsides. Call your physician if vomiting continues.  Special Instructions/Symptoms: Your throat may feel dry or sore from the anesthesia or the breathing tube placed in your throat during surgery. If this causes discomfort, gargle with warm salt  water. The discomfort should disappear within 24 hours.  If you had a scopolamine patch placed behind your ear for the management of post- operative nausea and/or vomiting:  1. The medication in the patch is effective for 72 hours, after which it should be removed.  Wrap patch in a tissue and discard in the trash. Wash hands thoroughly with soap and water. 2. You may remove the patch earlier than 72 hours if you experience unpleasant side effects which may include dry mouth, dizziness or visual disturbances. 3. Avoid touching the patch. Wash your hands with soap and water after contact with the patch.

## 2020-04-21 ENCOUNTER — Encounter (HOSPITAL_BASED_OUTPATIENT_CLINIC_OR_DEPARTMENT_OTHER): Payer: Self-pay | Admitting: Obstetrics & Gynecology

## 2020-04-21 LAB — SURGICAL PATHOLOGY

## 2020-06-14 ENCOUNTER — Ambulatory Visit: Payer: BC Managed Care – PPO | Attending: Internal Medicine

## 2020-06-14 DIAGNOSIS — Z23 Encounter for immunization: Secondary | ICD-10-CM

## 2020-06-14 NOTE — Progress Notes (Signed)
   Covid-19 Vaccination Clinic  Name:  BEREA MAJKOWSKI    MRN: [id number] DOB: 1956/01/04  06/14/2020  Ms. Totino was observed post Covid-19 immunization for 15 minutes without incident. She was provided with Vaccine Information Sheet and instruction to access the V-Safe system.   Ms. Deloach was instructed to call 911 with any severe reactions post vaccine: Marland Kitchen Difficulty breathing  . Swelling of face and throat  . A fast heartbeat  . A bad rash all over body  . Dizziness and weakness

## 2020-07-06 ENCOUNTER — Ambulatory Visit: Payer: BC Managed Care – PPO | Admitting: Podiatry

## 2020-07-18 ENCOUNTER — Other Ambulatory Visit: Payer: Self-pay | Admitting: Adult Health

## 2020-07-18 DIAGNOSIS — Z9889 Other specified postprocedural states: Secondary | ICD-10-CM

## 2020-08-25 ENCOUNTER — Ambulatory Visit
Admission: RE | Admit: 2020-08-25 | Discharge: 2020-08-25 | Disposition: A | Discharge status: Home / Self Care | Payer: BC Managed Care – PPO | Source: Ambulatory Visit | Attending: Adult Health | Admitting: Adult Health

## 2020-08-25 ENCOUNTER — Other Ambulatory Visit: Payer: Self-pay

## 2020-08-25 DIAGNOSIS — Z9889 Other specified postprocedural states: Secondary | ICD-10-CM

## 2020-08-30 ENCOUNTER — Ambulatory Visit: Payer: BC Managed Care – PPO | Admitting: Podiatry

## 2020-09-12 ENCOUNTER — Ambulatory Visit (INDEPENDENT_AMBULATORY_CARE_PROVIDER_SITE_OTHER): Payer: BC Managed Care – PPO | Admitting: Podiatry

## 2020-09-12 ENCOUNTER — Other Ambulatory Visit: Payer: Self-pay

## 2020-09-12 ENCOUNTER — Encounter: Payer: Self-pay | Admitting: Podiatry

## 2020-09-12 DIAGNOSIS — M47817 Spondylosis without myelopathy or radiculopathy, lumbosacral region: Secondary | ICD-10-CM | POA: Insufficient documentation

## 2020-09-12 DIAGNOSIS — M25569 Pain in unspecified knee: Secondary | ICD-10-CM | POA: Insufficient documentation

## 2020-09-12 DIAGNOSIS — M79675 Pain in left toe(s): Secondary | ICD-10-CM

## 2020-09-12 DIAGNOSIS — N83209 Unspecified ovarian cyst, unspecified side: Secondary | ICD-10-CM | POA: Insufficient documentation

## 2020-09-12 DIAGNOSIS — T451X5A Adverse effect of antineoplastic and immunosuppressive drugs, initial encounter: Secondary | ICD-10-CM

## 2020-09-12 DIAGNOSIS — R600 Localized edema: Secondary | ICD-10-CM | POA: Insufficient documentation

## 2020-09-12 DIAGNOSIS — B351 Tinea unguium: Secondary | ICD-10-CM | POA: Diagnosis not present

## 2020-09-12 DIAGNOSIS — E119 Type 2 diabetes mellitus without complications: Secondary | ICD-10-CM | POA: Diagnosis not present

## 2020-09-12 DIAGNOSIS — E782 Mixed hyperlipidemia: Secondary | ICD-10-CM | POA: Insufficient documentation

## 2020-09-12 DIAGNOSIS — G620 Drug-induced polyneuropathy: Secondary | ICD-10-CM | POA: Diagnosis not present

## 2020-09-12 DIAGNOSIS — N952 Postmenopausal atrophic vaginitis: Secondary | ICD-10-CM | POA: Insufficient documentation

## 2020-09-12 DIAGNOSIS — Z794 Long term (current) use of insulin: Secondary | ICD-10-CM | POA: Insufficient documentation

## 2020-09-12 DIAGNOSIS — E663 Overweight: Secondary | ICD-10-CM | POA: Insufficient documentation

## 2020-09-12 DIAGNOSIS — M79674 Pain in right toe(s): Secondary | ICD-10-CM

## 2020-09-12 DIAGNOSIS — L84 Corns and callosities: Secondary | ICD-10-CM | POA: Diagnosis not present

## 2020-09-12 DIAGNOSIS — M5430 Sciatica, unspecified side: Secondary | ICD-10-CM | POA: Insufficient documentation

## 2020-09-12 DIAGNOSIS — Z86000 Personal history of in-situ neoplasm of breast: Secondary | ICD-10-CM | POA: Insufficient documentation

## 2020-09-12 DIAGNOSIS — Z853 Personal history of malignant neoplasm of breast: Secondary | ICD-10-CM | POA: Insufficient documentation

## 2020-09-12 DIAGNOSIS — N950 Postmenopausal bleeding: Secondary | ICD-10-CM | POA: Insufficient documentation

## 2020-09-12 DIAGNOSIS — G4733 Obstructive sleep apnea (adult) (pediatric): Secondary | ICD-10-CM | POA: Insufficient documentation

## 2020-09-12 DIAGNOSIS — E1165 Type 2 diabetes mellitus with hyperglycemia: Secondary | ICD-10-CM | POA: Insufficient documentation

## 2020-09-12 DIAGNOSIS — E7800 Pure hypercholesterolemia, unspecified: Secondary | ICD-10-CM | POA: Insufficient documentation

## 2020-09-12 NOTE — Progress Notes (Signed)
Subjective:  Patient ID: Audrey Peters, female    DOB: 1956/03/06,  MRN: [id number]  65 y.o. female presents with preventative diabetic foot care and callus(es) right foot and painful thick toenails that are difficult to trim. Painful toenails interfere with ambulation. Aggravating factors include wearing enclosed shoe gear. Pain is relieved with periodic professional debridement. Painful calluses are aggravated when weightbearing with and without shoegear. Pain is relieved with periodic professional debridement.  Patient's blood sugar was 105 mg/dl this morning.  PCP: [name], MD and last visit was: 06/10/2020.  Endocrinologist is Dr. Delrae Rend and last visit was 06/22/2020.  Review of Systems: Negative except as noted in the HPI.  Past Medical History:  Diagnosis Date  . Anemia yrs ago  . Arthritis   . Breast cancer ([place])   . Cancer [facility])    recent dx in breast  . Carpal tunnel syndrome of right wrist   . Diabetes mellitus without complication ([place])    dx 2008  . Headache    sinus  . Hypertension   . Personal history of chemotherapy   . Personal history of radiation therapy   . Vaginal delivery 1983   Past Surgical History:  Procedure Laterality Date  . BREAST BIOPSY    . BREAST LUMPECTOMY Left   . BREAST LUMPECTOMY WITH RADIOACTIVE SEED AND SENTINEL LYMPH NODE BIOPSY Left 04/30/2017   Procedure: LEFT BREAST LUMPECTOMY WITH RADIOACTIVE SEED AND LEFT SENTINEL LYMPH NODE BIOPSY ERAS PATHWAY;  Surgeon: Erroll Luna, MD;  Location: [place];  Service: General;  Laterality: Left;  . COLONOSCOPY WITH PROPOFOL N/A 05/28/2016   Procedure: COLONOSCOPY WITH PROPOFOL;  Surgeon: Garlan Fair, MD;  Location: [facility];  Service: Endoscopy;  Laterality: N/A;  . DILATATION & CURETTAGE/HYSTEROSCOPY WITH MYOSURE N/A 04/20/2020   Procedure: DILATATION & CURETTAGE/HYSTEROSCOPY WITH MYOSURE;  Surgeon: Janyth Pupa, DO;  Location: [place];  Service: Gynecology;   Laterality: N/A;  . DILATION AND CURETTAGE OF UTERUS    . HYSTEROSCOPY WITH D & C N/A 07/21/2015   Procedure: DILATATION AND CURETTAGE /HYSTEROSCOPY with myosure;  Surgeon: Janyth Pupa, DO;  Location: [facility];  Service: Gynecology;  Laterality: N/A;  . PORTACATH PLACEMENT Right 04/30/2017   Procedure: INSERTION PORT-A-CATH;  Surgeon: Erroll Luna, MD;  Location: [place];  Service: General;  Laterality: Right;   Patient Active Problem List   Diagnosis Date Noted  . Atrophy of vagina 09/12/2020  . Bilateral lower extremity edema 09/12/2020  . History of ductal carcinoma in situ of breast 09/12/2020  . Hyperglycemia due to type 2 diabetes mellitus ([place]) 09/12/2020  . Knee pain 09/12/2020  . Long term (current) use of insulin ([place]) 09/12/2020  . Lumbosacral spondylosis without myelopathy 09/12/2020  . Mixed hyperlipidemia 09/12/2020  . Obstructive sleep apnea syndrome 09/12/2020  . Ovarian cyst 09/12/2020  . Overweight 09/12/2020  . Personal history of malignant neoplasm of breast 09/12/2020  . Postmenopausal bleeding 09/12/2020  . Pure hypercholesterolemia 09/12/2020  . Sciatica 09/12/2020  . Morbid obesity ([place]) 09/12/2020  . Chemotherapy-induced peripheral neuropathy ([place]) 01/22/2018  . Encounter for antineoplastic chemotherapy 06/07/2017  . Port-A-Cath in place 05/24/2017  . Genetic testing 05/23/2017  . Malignant neoplasm of upper-outer quadrant of left breast in female, estrogen receptor negative ([place]) 04/22/2017  . Essential hypertension 01/30/2015  . Diabetes mellitus ([place]) 01/30/2015    Current Outpatient Medications:  .  atorvastatin (LIPITOR) 10 MG tablet, 1 tablet, Disp: , Rfl:  .  Continuous Blood Gluc Sensor (FREESTYLE LIBRE  2 SENSOR) MISC, ., Disp: , Rfl:  .  Dulaglutide (TRULICITY) 3 DG/3.8VF SOPN, 3  mg, Disp: , Rfl:  .  pregabalin (LYRICA) 150 MG capsule, 1 capsule, Disp: , Rfl:  .  acetaminophen (TYLENOL 8 HOUR ARTHRITIS PAIN) 650 MG CR tablet, Take 650 mg by  mouth every 8 (eight) hours as needed for pain., Disp: , Rfl:  .  clotrimazole (LOTRIMIN) 1 % cream, Apply 1 application topically daily as needed (for irritated/itchy skin.). , Disp: , Rfl:  .  clotrimazole-betamethasone (LOTRISONE) cream, , Disp: , Rfl:  .  clotrimazole-betamethasone (LOTRISONE) cream, APPLY TO AFFECTED AREA TWICE A DAY., Disp: , Rfl:  .  Cobalamin Combinations (B12 FOLATE) 800-800 MCG CAPS, See admin instructions., Disp: , Rfl:  .  glucose blood (ONETOUCH VERIO) test strip, use to check blood sugar, Disp: , Rfl:  .  Insulin Pen Needle (B-D ULTRAFINE III SHORT PEN) 31G X 8 MM MISC, 3 (three) times daily., Disp: , Rfl:  .  insulin regular human CONCENTRATED (HUMULIN R U-500 KWIKPEN) 500 UNIT/ML kwikpen, 150 units before breakfast, 80 units before lunch, 80 units before evening meal, Disp: , Rfl:  .  losartan (COZAAR) 100 MG tablet, Take 100 mg by mouth daily., Disp: , Rfl:  .  metFORMIN (GLUCOPHAGE-XR) 500 MG 24 hr tablet, 2 tablets with evening meal, Disp: , Rfl:  .  ONETOUCH VERIO test strip, 2 (two) times daily., Disp: , Rfl:  .  tobramycin-dexamethasone (TOBRADEX) ophthalmic solution, SMARTSIG:In Eye(s), Disp: , Rfl:  .  triamterene-hydrochlorothiazide (MAXZIDE-25) 37.5-25 MG tablet, Take 1 tablet by mouth every morning., Disp: , Rfl:  .  TRULICITY 1.5 IE/3.3IR SOPN, SMARTSIG:0.5 Milliliter(s) SUB-Q Once a Week, Disp: , Rfl:  Allergies  Allergen Reactions  . Canagliflozin Other (See Comments)    Caused a yeast infection Other reaction(s): yeast infections  . Empagliflozin     Other reaction(s): yeast infections  . Other     Other reaction(s): palpitation Other reaction(s): Unknown  . Sulfa Antibiotics Itching and Other (See Comments)    Unsure of exact reaction type  . Ciprofloxacin Palpitations   Social History   Tobacco Use  Smoking Status Never Smoker  Smokeless Tobacco Never Used    Objective:  There were no vitals filed for this visit. Constitutional  Patient is a pleasant 65 y.o. African American female obese in NAD. AAO x 3.  Vascular Capillary refill time to digits immediate b/l. Palpable DP pulse(s) b/l lower extremities Nonpalpable PT pulse(s) b/l lower extremities. Pedal hair sparse. Lower extremity skin temperature gradient within normal limits. No cyanosis or clubbing noted.  Neurologic Normal speech. Protective sensation intact 5/5 intact bilaterally with 10g monofilament b/l. Vibratory sensation diminished b/l.  Dermatologic Pedal skin with normal turgor, texture and tone bilaterally. No open wounds bilaterally. No interdigital macerations bilaterally. Toenails 1-5 b/l elongated, discolored, dystrophic, thickened, crumbly with subungual debris and tenderness to dorsal palpation. Hyperkeratotic lesion(s) R hallux and R 2nd toe.  No erythema, no edema, no drainage, no fluctuance.  Orthopedic: Normal muscle strength 5/5 to all lower extremity muscle groups bilaterally. No pain crepitus or joint limitation noted with ROM b/l. Pes planus deformity noted b/l.    No flowsheet data found.     Assessment:   1. Pain due to onychomycosis of toenails of both feet   2. Callus   3. Chemotherapy-induced peripheral neuropathy ([place])   4. Type 2 diabetes mellitus without complication, with long-term current use of insulin ([place])    Plan:  Patient  was evaluated and treated and all questions answered.  Onychomycosis with pain -Nails palliatively debridement as below. -Educated on self-care  Procedure: Nail Debridement Rationale: Pain Type of Debridement: manual, sharp debridement. Instrumentation: Nail nipper, rotary burr. Number of Nails: 10  -Examined patient. -Continue diabetic foot care principles. -Patient to continue soft, supportive shoe gear daily. -Toenails 1-5 b/l were debrided in length and girth with sterile nail nippers and dremel without iatrogenic bleeding.  -Corn(s) R 2nd toe and callus(es) R hallux were pared utilizing  sterile scalpel blade without incident. Total number debrided =2. -Patient to report any pedal injuries to medical professional immediately. -Patient/POA to call should there be question/concern in the interim.  Return in about 3 months (around 12/10/2020).  Marzetta Board, DPM

## 2020-10-25 NOTE — Progress Notes (Signed)
Patient Care Team: [name], MD as PCP - General (Internal Medicine) [name], MD as Consulting Physician (Hematology and Oncology) Eppie Gibson, MD as Attending Physician (Radiation Oncology) Erroll Luna, MD as Consulting Physician (General Surgery) Gardenia Phlegm, NP as Nurse Practitioner (Hematology and Oncology)  DIAGNOSIS:    ICD-10-CM   1. Malignant neoplasm of upper-outer quadrant of left breast in female, estrogen receptor negative ([place])  C50.412    Z17.1     SUMMARY OF ONCOLOGIC HISTORY: Oncology History  Malignant neoplasm of upper-outer quadrant of left breast in female, estrogen receptor negative ([place])  04/05/2017 Initial Diagnosis   Left breast asymmetry by ultrasound measured 1.3 cm at 2:30 position 10 cm from nipple, no axillary lymph nodes; biopsy IDC grade 2, ER 0%, PR 0%, HER-2 negative ratio 1.37, Ki-67 40%, T1c N0 stage IB AJCC 8    04/30/2017 Surgery   Left lumpectomy: IDC grade 3, 1.7 cm, DCIS, lymphovascular invasion present, margins negative, 0/1 lymph node negative, ER 0%, PR 0%, HER-2 negative ratio 1.37, Ki-67 40%, T1c N0 stage IB   05/22/2017 Genetic Testing   Patient had genetic testing due to a personal history of triple negative breast cancer.  The Common Hereditary Cancer Panel was ordered. The Hereditary Gene Panel offered by [facility] includes sequencing and/or deletion duplication testing of the following 46 genes: APC, ATM, AXIN2, BARD1, BMPR1A, BRCA1, BRCA2, BRIP1, CDH1, CDKN2A (p14ARF), CDKN2A (p16INK4a), CHEK2, CTNNA1, DICER1, EPCAM (Deletion/duplication testing only), GREM1 (promoter region deletion/duplication testing only), KIT, MEN1, MLH1, MSH2, MSH3, MSH6, MUTYH, NBN, NF1, NHTL1, PALB2, PDGFRA, PMS2, POLD1, POLE, PTEN, RAD50, RAD51C, RAD51D, SDHB, SDHC, SDHD, SMAD4, SMARCA4. STK11, TP53, TSC1, TSC2, and VHL.  The following genes were evaluated for sequence changes only: SDHA and HOXB13 c.251G>A variant only.    Results: No  pathogenic mutations identified.  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  The date of this test report is 05/22/2017.    05/24/2017 - 08/30/2017 Chemotherapy   Dose dense Adriamycin and Cytoxan 4 followed by Taxol weekly 5 (stopped early for neuropathy)    09/26/2017 - 10/22/2017 Radiation Therapy   Adjuvant radiation therapy     CHIEF COMPLIANT: Surveillance of breast cancer  INTERVAL HISTORY: Audrey Peters is a 65 y.o. with above-mentioned history of breast cancer treated with lumpectomy, adjuvant chemotherapy discontinued after 5 cycles of Taxol due to neuropathy, radiation, and who is currently on surveillance. Mammogram on 08/25/20 showed no evidence of malignancy bilaterally. She presents to the clinic today for annual follow-up.   She continues to suffer from fatigue and peripheral neuropathy.  The peripheral neuropathy is especially worse at night when she feels like her feet turning toward.  ALLERGIES:  is allergic to canagliflozin, empagliflozin, other, sulfa antibiotics, and ciprofloxacin.  MEDICATIONS:  Current Outpatient Medications  Medication Sig Dispense Refill   acetaminophen (TYLENOL 8 HOUR ARTHRITIS PAIN) 650 MG CR tablet Take 650 mg by mouth every 8 (eight) hours as needed for pain.     atorvastatin (LIPITOR) 10 MG tablet 1 tablet     clotrimazole (LOTRIMIN) 1 % cream Apply 1 application topically daily as needed (for irritated/itchy skin.).      clotrimazole-betamethasone (LOTRISONE) cream      clotrimazole-betamethasone (LOTRISONE) cream APPLY TO AFFECTED AREA TWICE A DAY.     Cobalamin Combinations (B12 FOLATE) 800-800 MCG CAPS See admin instructions.     Continuous Blood Gluc Sensor (FREESTYLE LIBRE 2 SENSOR) MISC .     Dulaglutide (TRULICITY) 3 OY/7.7AJ SOPN  3  mg     glucose blood (ONETOUCH VERIO) test strip use to check blood sugar     Insulin Pen Needle (B-D ULTRAFINE III SHORT PEN) 31G X 8 MM MISC 3 (three) times daily.     insulin  regular human CONCENTRATED (HUMULIN R U-500 KWIKPEN) 500 UNIT/ML kwikpen 150 units before breakfast, 80 units before lunch, 80 units before evening meal     losartan (COZAAR) 100 MG tablet Take 100 mg by mouth daily.     metFORMIN (GLUCOPHAGE-XR) 500 MG 24 hr tablet 2 tablets with evening meal     ONETOUCH VERIO test strip 2 (two) times daily.     pregabalin (LYRICA) 150 MG capsule 1 capsule     tobramycin-dexamethasone (TOBRADEX) ophthalmic solution SMARTSIG:In Eye(s)     triamterene-hydrochlorothiazide (MAXZIDE-25) 37.5-25 MG tablet Take 1 tablet by mouth every morning.     TRULICITY 1.5 YD/7.4JO SOPN SMARTSIG:0.5 Milliliter(s) SUB-Q Once a Week     No current facility-administered medications for this visit.    PHYSICAL EXAMINATION: ECOG PERFORMANCE STATUS: 1 - Symptomatic but completely ambulatory  Vitals:   10/26/20 1022  BP: 138/79  Pulse: 80  Resp: 18  Temp: (!) 97.3 F (36.3 C)  SpO2: 100%   Filed Weights   10/26/20 1022  Weight: 237 lb 1.6 oz (107.5 kg)    BREAST: No palpable masses or nodules in either right or left breasts. No palpable axillary supraclavicular or infraclavicular adenopathy no breast tenderness or nipple discharge. (exam performed in the presence of a chaperone)  LABORATORY DATA:  I have reviewed the data as listed CMP Latest Ref Rng & Units 04/14/2020 09/13/2017 09/06/2017  Glucose 70 - 99 mg/dL 295(H) 274(H) 247(H)  BUN 8 - 23 mg/dL _0 Creatinine 0.44 - 1.00 mg/dL 1.07(H) 0.87 0.90  Sodium 135 - 145 mmol/L 140 138 136  Potassium 3.5 - 5.1 mmol/L 3.9 4.3 3.1(L)  Chloride 98 - 111 mmol/L 103 107 101  CO2 22 - 32 mmol/L 27 21(L) 23  Calcium 8.9 - 10.3 mg/dL 9.3 8.8 8.9  Total Protein 6.4 - 8.3 g/dL - 6.3(L) 6.9  Total Bilirubin 0.2 - 1.2 mg/dL - 0.5 0.8  Alkaline Phos 40 - 150 U/L - 71 75  AST 5 - 34 U/L - 52(H) 78(H)  ALT 0 - 55 U/L - 33 44    Lab Results  Component Value Date   WBC 3.1 (L) 09/13/2017   HGB 8.9 (L) 09/13/2017    HCT 27.4 (L) 09/13/2017   MCV 91.0 09/13/2017   PLT 204 09/13/2017   NEUTROABS 1.8 09/13/2017    ASSESSMENT & PLAN:  Malignant neoplasm of upper-outer quadrant of left breast in female, estrogen receptor negative ([place]) 04/30/2017: Left lumpectomy: IDC grade 3, 1.7 cm, DCIS, lymphovascular invasion present, margins negative, 0/1 lymph node negative, ER 0%, PR 0%, HER-2 negative ratio 1.37, Ki-67 40%, T1c N0 stage IB  Recommendation: 1. adjuvant chemotherapy with dose dense Adriamycin and Cytoxan 4 followed by Taxol weekly 6discontinued for neuropathy 2. Followed by radiationstarted 09/26/2017-10/25/2018 --------------------------------------------------------------------------------------------------------------------------- Chemo-induced peripheral neuropathy:Currently on Lyrica because she could not tolerate Cymbalta.  This is especially worse at night.  Severe fatigue: Related to chemotherapy but also due to deconditioning.  She continues to feel fatigued. Obesity: Encouraged her to work on her diet to lose some weight.  Breast cancer surveillance: 1.Breast exam3/16/2021: Benign 2.mammogram 08/25/20: Benign postlumpectomy radiation changes left breast, breast density category B  Return to clinic inoneyear    No  orders of the defined types were placed in this encounter.  The patient has a good understanding of the overall plan. she agrees with it. she will call with any problems that may develop before the next visit here.  Total time spent: 20 mins including face to face time and time spent for planning, charting and coordination of care  Rulon Eisenmenger, MD, MPH 10/26/2020  I, Cloyde Reams Dorshimer, am acting as scribe for Dr. [name].  I have reviewed the above documentation for accuracy and completeness, and I agree with the above.

## 2020-10-25 NOTE — Assessment & Plan Note (Signed)
04/30/2017: Left lumpectomy: IDC grade 3, 1.7 cm, DCIS, lymphovascular invasion present, margins negative, 0/1 lymph node negative, ER 0%, PR 0%, HER-2 negative ratio 1.37, Ki-67 40%, T1c N0 stage IB  Recommendation: 1. adjuvant chemotherapy with dose dense Adriamycin and Cytoxan 4 followed by Taxol weekly 6discontinued for neuropathy 2. Followed by radiationstarted 09/26/2017-10/25/2018 --------------------------------------------------------------------------------------------------------------------------- Chemo-induced peripheral neuropathy:Currently on Lyrica because she could not tolerate Cymbalta. I will refer her to physical therapy for transcutaneous electric nerve stimulation.  Severe fatigue: Related to chemotherapy but also due to deconditioning. Ionce againencouraged her to join the [employer] live strong program. Obesity: We discussed about the importance of losing weight using portion control and avoiding simple sugars.  Breast cancer surveillance: 1.Breast exam3/16/2021: Benign 2.mammogram 08/25/20: Benign postlumpectomy radiation changes left breast, breast density category B  Return to clinic inoneyear

## 2020-10-26 ENCOUNTER — Other Ambulatory Visit: Payer: Self-pay

## 2020-10-26 ENCOUNTER — Inpatient Hospital Stay: Payer: BC Managed Care – PPO | Attending: Hematology and Oncology | Admitting: Hematology and Oncology

## 2020-10-26 ENCOUNTER — Telehealth: Payer: Self-pay | Admitting: Hematology and Oncology

## 2020-10-26 DIAGNOSIS — Z853 Personal history of malignant neoplasm of breast: Secondary | ICD-10-CM | POA: Diagnosis present

## 2020-10-26 DIAGNOSIS — G620 Drug-induced polyneuropathy: Secondary | ICD-10-CM | POA: Diagnosis not present

## 2020-10-26 DIAGNOSIS — Z923 Personal history of irradiation: Secondary | ICD-10-CM | POA: Insufficient documentation

## 2020-10-26 DIAGNOSIS — Z171 Estrogen receptor negative status [ER-]: Secondary | ICD-10-CM | POA: Diagnosis not present

## 2020-10-26 DIAGNOSIS — C50412 Malignant neoplasm of upper-outer quadrant of left female breast: Secondary | ICD-10-CM | POA: Diagnosis not present

## 2020-10-26 NOTE — Telephone Encounter (Signed)
Scheduled appointment per 3/16 los. Spoke to patient who is aware of appointment date and time. Gave patient AVS print out.

## 2020-11-04 ENCOUNTER — Ambulatory Visit: Payer: Self-pay | Admitting: Surgery

## 2020-12-23 ENCOUNTER — Ambulatory Visit: Payer: BC Managed Care – PPO | Admitting: Podiatry

## 2021-03-06 ENCOUNTER — Ambulatory Visit: Payer: BC Managed Care – PPO | Admitting: Podiatry

## 2021-03-06 ENCOUNTER — Encounter: Payer: Self-pay | Admitting: Podiatry

## 2021-03-06 ENCOUNTER — Other Ambulatory Visit: Payer: Self-pay

## 2021-03-06 DIAGNOSIS — B351 Tinea unguium: Secondary | ICD-10-CM

## 2021-03-06 DIAGNOSIS — M79675 Pain in left toe(s): Secondary | ICD-10-CM

## 2021-03-06 DIAGNOSIS — Z794 Long term (current) use of insulin: Secondary | ICD-10-CM

## 2021-03-06 DIAGNOSIS — T451X5A Adverse effect of antineoplastic and immunosuppressive drugs, initial encounter: Secondary | ICD-10-CM

## 2021-03-06 DIAGNOSIS — E119 Type 2 diabetes mellitus without complications: Secondary | ICD-10-CM | POA: Diagnosis not present

## 2021-03-06 DIAGNOSIS — M722 Plantar fascial fibromatosis: Secondary | ICD-10-CM

## 2021-03-06 DIAGNOSIS — M79674 Pain in right toe(s): Secondary | ICD-10-CM

## 2021-03-06 DIAGNOSIS — G620 Drug-induced polyneuropathy: Secondary | ICD-10-CM | POA: Diagnosis not present

## 2021-03-06 DIAGNOSIS — L84 Corns and callosities: Secondary | ICD-10-CM | POA: Diagnosis not present

## 2021-03-10 NOTE — Progress Notes (Signed)
  Subjective:  Patient ID: Audrey Peters, female    DOB: 1956-04-26,  MRN: [id number]  Audrey Peters presents to clinic today for preventative diabetic foot care and painful thick toenails that are difficult to trim. Pain interferes with ambulation. Aggravating factors include wearing enclosed shoe gear. Pain is relieved with periodic professional debridement.  She states she is having pain in her right heel on today's visit. It's been present for the past few Peters. She denies any episodes of trauma.  PCP is [name], MD.  Allergies  Allergen Reactions   Canagliflozin Other (See Comments)    Caused a yeast infection Other reaction(s): yeast infections   Empagliflozin     Other reaction(s): yeast infections   Other     Other reaction(s): palpitation Other reaction(s): Unknown   Sulfa Antibiotics Itching and Other (See Comments)    Unsure of exact reaction type   Ciprofloxacin Palpitations    Review of Systems: Negative except as noted in the HPI. Objective:   Constitutional Audrey Peters is a pleasant 65 y.o. African American female, in NAD. AAO x 3.   Vascular Capillary refill time to digits immediate b/l. Palpable DP pulse(s) b/l lower extremities Nonpalpable PT pulse(s) b/l lower extremities. Pedal hair sparse. Lower extremity skin temperature gradient within normal limits. No pain with calf compression b/l. No cyanosis or clubbing noted.  Neurologic Normal speech. Oriented to person, place, and time. Protective sensation intact 5/5 intact bilaterally with 10g monofilament b/l. Vibratory sensation intact b/l.  Dermatologic Pedal skin with normal turgor, texture and tone b/l lower extremities. No open wounds b/l lower extremities. No interdigital macerations b/l lower extremities. Toenails 1-5 b/l elongated, discolored, dystrophic, thickened, crumbly with subungual debris and tenderness to dorsal palpation. Hyperkeratotic lesion(s) R hallux and R 2nd toe.  No erythema, no  edema, no drainage, no fluctuance.  Orthopedic: Normal muscle strength 5/5 to all lower extremity muscle groups bilaterally. No pain crepitus or joint limitation noted with ROM b/l. Pes planus deformity noted b/l.  Pain on palpation medial tubercle right heel. Her current pair of shoes do not have much arch support.   Radiographs: None Assessment:   1. Pain due to onychomycosis of toenails of both feet   2. Callus   3. Chemotherapy-induced peripheral neuropathy ([place])   4. Plantar fasciitis of right foot   5. Type 2 diabetes mellitus without complication, with long-term current use of insulin ([facility])    Plan:  -Examined patient. -For plantar fasciitis, placed felt arch pads in shoes. Refer to Dr. Cannon Kettle for further workup. -Continue diabetic foot care principles. -Patient to continue soft, supportive shoe gear daily. -Toenails 1-5 b/l were debrided in length and girth with sterile nail nippers and dremel without iatrogenic bleeding.  -Corn(s) R 2nd toe and callus(es) R hallux were pared utilizing sterile scalpel blade without incident. Total number debrided =2. -Patient to report any pedal injuries to medical professional immediately. -Patient/POA to call should there be question/concern in the interim.  Return in about 3 months (around 06/06/2021).  Marzetta Board, DPM

## 2021-03-30 ENCOUNTER — Other Ambulatory Visit: Payer: Self-pay | Admitting: Sports Medicine

## 2021-03-30 ENCOUNTER — Other Ambulatory Visit: Payer: Self-pay

## 2021-03-30 ENCOUNTER — Ambulatory Visit (INDEPENDENT_AMBULATORY_CARE_PROVIDER_SITE_OTHER): Payer: BC Managed Care – PPO

## 2021-03-30 ENCOUNTER — Ambulatory Visit: Payer: BC Managed Care – PPO | Admitting: Sports Medicine

## 2021-03-30 ENCOUNTER — Encounter: Payer: Self-pay | Admitting: Sports Medicine

## 2021-03-30 DIAGNOSIS — E119 Type 2 diabetes mellitus without complications: Secondary | ICD-10-CM

## 2021-03-30 DIAGNOSIS — M722 Plantar fascial fibromatosis: Secondary | ICD-10-CM | POA: Diagnosis not present

## 2021-03-30 DIAGNOSIS — M2141 Flat foot [pes planus] (acquired), right foot: Secondary | ICD-10-CM | POA: Diagnosis not present

## 2021-03-30 DIAGNOSIS — M2142 Flat foot [pes planus] (acquired), left foot: Secondary | ICD-10-CM

## 2021-03-30 DIAGNOSIS — M79671 Pain in right foot: Secondary | ICD-10-CM | POA: Diagnosis not present

## 2021-03-30 DIAGNOSIS — Z794 Long term (current) use of insulin: Secondary | ICD-10-CM

## 2021-03-30 MED ORDER — TRIAMCINOLONE ACETONIDE 10 MG/ML IJ SUSP
10.0000 mg | Freq: Once | INTRAMUSCULAR | Status: AC
  Administered 2021-03-30: 10 mg

## 2021-03-30 NOTE — Progress Notes (Addendum)
nSubjective: Audrey Peters is a 65 y.o.  diabetic female patient presents to office with complaint of moderate heel pain on the Right. Patient admits to post static dyskinesia for 1 month in duration that started after she travelled; felt some pain while walking in [place] and then the week when she got back was painful, pain now 5/10. Patient has treated this problem with arch pad given by Dr. Elisha Ponder with a little relief. Denies any other pedal complaints.   FBS 157 Last visit to endocrinologist 02/2021 A1c unknown   Patient Active Problem List   Diagnosis Date Noted   Atrophy of vagina 09/12/2020   Bilateral lower extremity edema 09/12/2020   History of ductal carcinoma in situ of breast 09/12/2020   Hyperglycemia due to type 2 diabetes mellitus ([place]) 09/12/2020   Knee pain 09/12/2020   Long term (current) use of insulin ([name]) 09/12/2020   Lumbosacral spondylosis without myelopathy 09/12/2020   Mixed hyperlipidemia 09/12/2020   Obstructive sleep apnea syndrome 09/12/2020   Ovarian cyst 09/12/2020   Overweight 09/12/2020   Personal history of malignant neoplasm of breast 09/12/2020   Postmenopausal bleeding 09/12/2020   Pure hypercholesterolemia 09/12/2020   Sciatica 09/12/2020   Morbid obesity ([name]) 09/12/2020   Chemotherapy-induced peripheral neuropathy ([name]) 01/22/2018   Encounter for antineoplastic chemotherapy 06/07/2017   Port-A-Cath in place 05/24/2017   Genetic testing 05/23/2017   Malignant neoplasm of upper-outer quadrant of left breast in female, estrogen receptor negative ([name]) 04/22/2017   Essential hypertension 01/30/2015   Diabetes mellitus ([name]) 01/30/2015    Current Outpatient Medications on File Prior to Visit  Medication Sig Dispense Refill   acetaminophen (TYLENOL 8 HOUR ARTHRITIS PAIN) 650 MG CR tablet Take 650 mg by mouth every 8 (eight) hours as needed for pain.     atorvastatin (LIPITOR) 10 MG tablet 1 tablet     clotrimazole (LOTRIMIN) 1 % cream Apply  1 application topically daily as needed (for irritated/itchy skin.).      clotrimazole-betamethasone (LOTRISONE) cream      clotrimazole-betamethasone (LOTRISONE) cream APPLY TO AFFECTED AREA TWICE A DAY.     Cobalamin Combinations (B12 FOLATE) 800-800 MCG CAPS See admin instructions.     Continuous Blood Gluc Sensor (FREESTYLE LIBRE 2 SENSOR) MISC .     Dulaglutide (TRULICITY) 3 0000000 SOPN 3  mg     glucose blood (ONETOUCH VERIO) test strip use to check blood sugar     Insulin Pen Needle (B-D ULTRAFINE III SHORT PEN) 31G X 8 MM MISC 3 (three) times daily.     insulin regular human CONCENTRATED (HUMULIN R U-500 KWIKPEN) 500 UNIT/ML kwikpen 150 units before breakfast, 80 units before lunch, 80 units before evening meal     losartan (COZAAR) 100 MG tablet Take 100 mg by mouth daily.     metFORMIN (GLUCOPHAGE-XR) 500 MG 24 hr tablet 2 tablets with evening meal     ONETOUCH VERIO test strip 2 (two) times daily.     pregabalin (LYRICA) 150 MG capsule 1 capsule     tobramycin-dexamethasone (TOBRADEX) ophthalmic solution SMARTSIG:In Eye(s)     triamterene-hydrochlorothiazide (MAXZIDE-25) 37.5-25 MG tablet Take 1 tablet by mouth every morning.     TRULICITY 1.5 0000000 SOPN SMARTSIG:0.5 Milliliter(s) SUB-Q Once a Week     [DISCONTINUED] prochlorperazine (COMPAZINE) 10 MG tablet Take 1 tablet (10 mg total) by mouth every 6 (six) hours as needed (Nausea or vomiting). 30 tablet 1   No current facility-administered medications on file prior to visit.  Allergies  Allergen Reactions   Canagliflozin Other (See Comments)    Caused a yeast infection Other reaction(s): yeast infections   Empagliflozin     Other reaction(s): yeast infections   Other     Other reaction(s): palpitation Other reaction(s): Unknown   Sulfa Antibiotics Itching and Other (See Comments)    Unsure of exact reaction type   Ciprofloxacin Palpitations    Objective: Physical Exam General: The patient is alert and  oriented x3 in no acute distress.  Dermatology: Skin is warm, dry and supple bilateral lower extremities. Nails 1-10 are normal. There is no erythema, edema, no eccymosis, no open lesions present. Integument is otherwise unremarkable.  Vascular: Dorsalis Pedis pulse and Posterior Tibial pulse are 2/4 bilateral. Capillary fill time is immediate to all digits.  Neurological: Grossly intact to light touch bilateral.  Musculoskeletal: Tenderness to palpation at the medial calcaneal tubercale and through the insertion of the plantar fascia on the right foot. No pain with compression of calcaneus bilateral. No pain with calf compression bilateral. There is decreased Ankle joint range of motion bilateral. + Pes planus foot type. All other joints range of motion within normal limits bilateral. Strength 5/5 in all groups bilateral.   Gait: Unassisted, Antalgic avoid weight on right heel  Xray, Right foot:  Normal osseous mineralization. Joint spaces preserved. No fracture/dislocation/boney destruction. Calcaneal spur present with mild thickening of plantar fascia. No other soft tissue abnormalities or radiopaque foreign bodies.   Assessment and Plan: Problem List Items Addressed This Visit       Endocrine   Diabetes mellitus ([name])   Other Visit Diagnoses     Plantar fasciitis of right foot    -  Primary   Relevant Orders   DG Foot Complete Right   Pain of right heel       Pes planus of both feet           -Complete examination performed.  -Xrays reviewed -Discussed with patient in detail the condition of plantar fasciitis, how this occurs and general treatment options. Explained both conservative and surgical treatments.  -After oral consent and aseptic prep, injected a mixture containing 1 ml of 2%  plain lidocaine, 1 ml 0.5% plain marcaine, 0.5 ml of kenalog 10 and 0.5 ml of dexamethasone phosphate into Right heel. Post-injection care discussed with patient.  -Recommended good  supportive shoes for foot type; long term may benefit from diabetic shoes - Explained in detail the use of the fascial brace for the right which was dispensed at today's visit. -Explained and dispensed to patient daily stretching exercises. -Recommend patient to ice affected area 1-2x daily. -Patient to return to office in 4 weeks for follow up or sooner if problems or questions arise.  Landis Martins, DPM

## 2021-04-27 ENCOUNTER — Encounter: Payer: Self-pay | Admitting: Sports Medicine

## 2021-04-27 ENCOUNTER — Ambulatory Visit: Payer: BC Managed Care – PPO | Admitting: Sports Medicine

## 2021-04-27 ENCOUNTER — Other Ambulatory Visit: Payer: Self-pay

## 2021-04-27 DIAGNOSIS — M79671 Pain in right foot: Secondary | ICD-10-CM

## 2021-04-27 DIAGNOSIS — M2141 Flat foot [pes planus] (acquired), right foot: Secondary | ICD-10-CM

## 2021-04-27 DIAGNOSIS — M722 Plantar fascial fibromatosis: Secondary | ICD-10-CM

## 2021-04-27 DIAGNOSIS — E119 Type 2 diabetes mellitus without complications: Secondary | ICD-10-CM

## 2021-04-27 DIAGNOSIS — M2142 Flat foot [pes planus] (acquired), left foot: Secondary | ICD-10-CM

## 2021-04-27 DIAGNOSIS — Z794 Long term (current) use of insulin: Secondary | ICD-10-CM

## 2021-04-27 NOTE — Progress Notes (Signed)
Subjective: Audrey Peters is a 65 y.o. female returns to office for follow up evaluation after Right heel injection for plantar fasciitis, injection #1 administered 3 weeks ago. Patient states that the injection seems to help her pain-free now feeling much better.  Patient states that she is able to walk without pain using brace and shoes with arch pads.  Patient also reports that she has been stretching as directed without any issues.  Patient is diabetic fasting blood sugar not recorded at today's visit.  Patient Active Problem List   Diagnosis Date Noted   Atrophy of vagina 09/12/2020   Bilateral lower extremity edema 09/12/2020   History of ductal carcinoma in situ of breast 09/12/2020   Hyperglycemia due to type 2 diabetes mellitus ([place]) 09/12/2020   Knee pain 09/12/2020   Long term (current) use of insulin ([place]) 09/12/2020   Lumbosacral spondylosis without myelopathy 09/12/2020   Mixed hyperlipidemia 09/12/2020   Obstructive sleep apnea syndrome 09/12/2020   Ovarian cyst 09/12/2020   Overweight 09/12/2020   Personal history of malignant neoplasm of breast 09/12/2020   Postmenopausal bleeding 09/12/2020   Pure hypercholesterolemia 09/12/2020   Sciatica 09/12/2020   Morbid obesity ([place]) 09/12/2020   Chemotherapy-induced peripheral neuropathy ([place]) 01/22/2018   Encounter for antineoplastic chemotherapy 06/07/2017   Port-A-Cath in place 05/24/2017   Genetic testing 05/23/2017   Malignant neoplasm of upper-outer quadrant of left breast in female, estrogen receptor negative ([place]) 04/22/2017   Essential hypertension 01/30/2015   Diabetes mellitus ([place]) 01/30/2015    Current Outpatient Medications on File Prior to Visit  Medication Sig Dispense Refill   acetaminophen (TYLENOL) 650 MG CR tablet Take 650 mg by mouth every 8 (eight) hours as needed for pain.     atorvastatin (LIPITOR) 10 MG tablet 1 tablet     clotrimazole (LOTRIMIN) 1 % cream Apply 1 application topically daily as  needed (for irritated/itchy skin.).      clotrimazole-betamethasone (LOTRISONE) cream      clotrimazole-betamethasone (LOTRISONE) cream APPLY TO AFFECTED AREA TWICE A DAY.     Cobalamin Combinations (B12 FOLATE) 800-800 MCG CAPS See admin instructions.     Continuous Blood Gluc Sensor (FREESTYLE LIBRE 2 SENSOR) MISC .     doxycycline (VIBRAMYCIN) 100 MG capsule Take 100 mg by mouth every 12 (twelve) hours.     Dulaglutide (TRULICITY) 3 0000000 SOPN 3  mg     glucose blood (ONETOUCH VERIO) test strip use to check blood sugar     Insulin Pen Needle (B-D ULTRAFINE III SHORT PEN) 31G X 8 MM MISC 3 (three) times daily.     insulin regular human CONCENTRATED (HUMULIN R U-500 KWIKPEN) 500 UNIT/ML kwikpen 150 units before breakfast, 80 units before lunch, 80 units before evening meal     losartan (COZAAR) 100 MG tablet Take 100 mg by mouth daily.     metFORMIN (GLUCOPHAGE-XR) 500 MG 24 hr tablet 2 tablets with evening meal     neomycin-polymyxin b-dexamethasone (MAXITROL) 3.5-10000-0.1 SUSP 1 drop 4 (four) times daily.     ONETOUCH VERIO test strip 2 (two) times daily.     pregabalin (LYRICA) 150 MG capsule 1 capsule     tobramycin-dexamethasone (TOBRADEX) ophthalmic solution SMARTSIG:In Eye(s)     triamterene-hydrochlorothiazide (MAXZIDE-25) 37.5-25 MG tablet Take 1 tablet by mouth every morning.     TRULICITY 1.5 0000000 SOPN SMARTSIG:0.5 Milliliter(s) SUB-Q Once a Week     [DISCONTINUED] prochlorperazine (COMPAZINE) 10 MG tablet Take 1 tablet (10 mg total) by mouth every 6 (  six) hours as needed (Nausea or vomiting). 30 tablet 1   No current facility-administered medications on file prior to visit.    Allergies  Allergen Reactions   Canagliflozin Other (See Comments)    Caused a yeast infection Other reaction(s): yeast infections   Empagliflozin     Other reaction(s): yeast infections   Other     Other reaction(s): palpitation Other reaction(s): Unknown   Sulfa Antibiotics Itching and  Other (See Comments)    Unsure of exact reaction type   Ciprofloxacin Palpitations    Objective:   General:  Alert and oriented x 3, in no acute distress  Dermatology: Skin is warm, dry, and supple bilateral. Nails are within normal limits. There is no lower extremity erythema, no eccymosis, no open lesions present bilateral.   Vascular: Dorsalis Pedis and Posterior Tibial pedal pulses are 1/4 bilateral trace edema present to ankles bilateral. + hair growth noted bilateral. Capillary Fill Time is 3 seconds in all digits. No varicosities.  Neurological: Sensation grossly intact to light touch bilateral.  Musculoskeletal: There is no tenderness to palpation at the medial calcaneal tubercale and through the insertion of the plantar fascia on the right foot. There is decreased Ankle joint range of motion bilateral. All other jointsrange of motion  within normal limits bilateral.  Pes planus foot type.  Strength 5/5 bilateral.   Assessment and Plan: Problem List Items Addressed This Visit       Endocrine   Diabetes mellitus ([place])   Other Visit Diagnoses     Plantar fasciitis of right foot    -  Primary   Pain of right heel       Pes planus of both feet           -Complete examination performed.  -Previous x-rays reviewed. -Discussed with patient in detail the condition of [place] fasciitis and continued care -Patient may slowly wean from plantar fascial brace as directed -Patient to return to office to be measured for custom diabetic insoles -Continue with stretching, icing, good supportive shoes -Discussed long term care and reocurrence; will closely monitor; if fails to improve will consider other treatment modalities.  -Patient to return to office as scheduled for follow up or sooner if problems or questions arise.  Landis Martins, DPM

## 2021-06-16 ENCOUNTER — Ambulatory Visit: Payer: BC Managed Care – PPO | Admitting: Podiatry

## 2021-09-07 ENCOUNTER — Other Ambulatory Visit: Payer: Self-pay | Admitting: Internal Medicine

## 2021-09-07 DIAGNOSIS — Z9889 Other specified postprocedural states: Secondary | ICD-10-CM

## 2021-09-26 ENCOUNTER — Ambulatory Visit: Payer: BC Managed Care – PPO

## 2021-10-03 ENCOUNTER — Ambulatory Visit
Admission: RE | Admit: 2021-10-03 | Discharge: 2021-10-03 | Disposition: A | Discharge status: Home / Self Care | Payer: BC Managed Care – PPO | Source: Ambulatory Visit | Attending: Internal Medicine | Admitting: Internal Medicine

## 2021-10-03 DIAGNOSIS — Z9889 Other specified postprocedural states: Secondary | ICD-10-CM

## 2021-10-31 ENCOUNTER — Other Ambulatory Visit: Payer: Self-pay

## 2021-10-31 ENCOUNTER — Inpatient Hospital Stay: Payer: BC Managed Care – PPO | Attending: Hematology and Oncology | Admitting: Hematology and Oncology

## 2021-10-31 DIAGNOSIS — G620 Drug-induced polyneuropathy: Secondary | ICD-10-CM | POA: Insufficient documentation

## 2021-10-31 DIAGNOSIS — C50412 Malignant neoplasm of upper-outer quadrant of left female breast: Secondary | ICD-10-CM | POA: Diagnosis present

## 2021-10-31 DIAGNOSIS — Z9221 Personal history of antineoplastic chemotherapy: Secondary | ICD-10-CM | POA: Diagnosis not present

## 2021-10-31 DIAGNOSIS — Z923 Personal history of irradiation: Secondary | ICD-10-CM | POA: Insufficient documentation

## 2021-10-31 DIAGNOSIS — Z171 Estrogen receptor negative status [ER-]: Secondary | ICD-10-CM

## 2021-10-31 DIAGNOSIS — Z79899 Other long term (current) drug therapy: Secondary | ICD-10-CM | POA: Diagnosis not present

## 2021-10-31 NOTE — Research (Signed)
[facility]-[place]-2102 - TREATMENT OF ESTABLISHED CHEMOTHERAPY-INDUCED NEUROPATHY WITH N-PALMITOYLETHANOLAMIDE, A CANNABIMIMETIC NUTRACEUTICAL: A RANDOMIZED DOUBLE-BLIND PHASE II PILOT TRIAL ? ?[name] was identified by Dr Lindi Adie as a potential candidate for the above listed study.  This Clinical Research Nurse met with Audrey Peters, [id number], on 10/31/21 in a manner and location that ensures patient privacy to discuss participation in the above listed research study.  Patient is Unaccompanied.  A copy of the informed consent document with embedded HIPAA language was provided to the patient.  Patient reads, speaks, and understands [place].   ? ?Patient was provided with the business card of this Nurse and encouraged to contact the research team with any questions.  Approximately 20 minutes were spent with the patient reviewing the informed consent documents.  Patient was provided the option of taking informed consent documents home to review and was encouraged to review at their convenience with their support network, including other care providers. Patient took the consent documents home to review. ? ?Patient rated her neuropathy a 9/10 today. Patient knows that she would have to come off gabapentin/pregabalin in order to participate in study. She requested a call from research to follow-up on interest next Monday morning.  ? ?Vickii Penna, RN, BSN, CPN ?Clinical Research Nurse I ?[phone] ? ?10/31/2021 11:09 AM ? ?

## 2021-10-31 NOTE — Progress Notes (Signed)
? ?Patient Care Team: ?[name], MD as PCP - General (Internal Medicine) ?[name], MD as Consulting Physician (Hematology and Oncology) ?Eppie Gibson, MD as Attending Physician (Radiation Oncology) ?Erroll Luna, MD as Consulting Physician (General Surgery) ?Gardenia Phlegm, NP as Nurse Practitioner (Hematology and Oncology) ? ?DIAGNOSIS:  ?Encounter Diagnosis  ?Name Primary?  ? Malignant neoplasm of upper-outer quadrant of left breast in female, estrogen receptor negative ([place])   ? ? ?SUMMARY OF ONCOLOGIC HISTORY: ?Oncology History  ?Malignant neoplasm of upper-outer quadrant of left breast in female, estrogen receptor negative ([place])  ?04/05/2017 Initial Diagnosis  ? Left breast asymmetry by ultrasound measured 1.3 cm at 2:30 position 10 cm from nipple, no axillary lymph nodes; biopsy IDC grade 2, ER 0%, PR 0%, HER-2 negative ratio 1.37, Ki-67 40%, T1c N0 stage IB AJCC 8  ?  ?04/30/2017 Surgery  ? Left lumpectomy: IDC grade 3, 1.7 cm, DCIS, lymphovascular invasion present, margins negative, 0/1 lymph node negative, ER 0%, PR 0%, HER-2 negative ratio 1.37, Ki-67 40%, T1c N0 stage IB ?  ?05/22/2017 Genetic Testing  ? Patient had genetic testing due to a personal history of triple negative breast cancer.  The Common Hereditary Cancer Panel was ordered. The Hereditary Gene Panel offered by [facility] includes sequencing and/or deletion duplication testing of the following 46 genes: APC, ATM, AXIN2, BARD1, BMPR1A, BRCA1, BRCA2, BRIP1, CDH1, CDKN2A (p14ARF), CDKN2A (p16INK4a), CHEK2, CTNNA1, DICER1, EPCAM (Deletion/duplication testing only), GREM1 (promoter region deletion/duplication testing only), KIT, MEN1, MLH1, MSH2, MSH3, MSH6, MUTYH, NBN, NF1, NHTL1, PALB2, PDGFRA, PMS2, POLD1, POLE, PTEN, RAD50, RAD51C, RAD51D, SDHB, SDHC, SDHD, SMAD4, SMARCA4. STK11, TP53, TSC1, TSC2, and VHL.  The following genes were evaluated for sequence changes only: SDHA and HOXB13 c.251G>A variant only.   ? ?Results:  No pathogenic mutations identified.  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  The date of this test report is 05/22/2017.  ?  ?05/24/2017 - 08/30/2017 Chemotherapy  ? Dose dense Adriamycin and Cytoxan ?4 followed by Taxol weekly ?5 (stopped early for neuropathy) ? ?  ?09/26/2017 - 10/22/2017 Radiation Therapy  ? Adjuvant radiation therapy ?  ? ? ?CHIEF COMPLIANT: Surveillance of breast cancer ? ?INTERVAL HISTORY: Audrey Peters is a  ?66 y.o. with above-mentioned history of breast cancer treated with lumpectomy, adjuvant chemotherapy discontinued after 5 cycles of Taxol due to neuropathy, radiation, and who is currently on surveillance. Mammogram on 05/21/19 showed no evidence of malignancy bilaterally. She presents to the clinic today for annual follow-up. She states that she still having neuropathy in hands. mainly at night. Feet and fingers are tingling. She is taking the gabapentin. She states if she doesn't take it it gets intense.  ? ?ALLERGIES:  is allergic to canagliflozin, empagliflozin, other, sulfa antibiotics, and ciprofloxacin. ? ?MEDICATIONS:  ?Current Outpatient Medications  ?Medication Sig Dispense Refill  ? acetaminophen (TYLENOL) 650 MG CR tablet Take 650 mg by mouth every 8 (eight) hours as needed for pain.    ? atorvastatin (LIPITOR) 10 MG tablet 1 tablet    ? clotrimazole (LOTRIMIN) 1 % cream Apply 1 application topically daily as needed (for irritated/itchy skin.).     ? clotrimazole-betamethasone (LOTRISONE) cream     ? clotrimazole-betamethasone (LOTRISONE) cream APPLY TO AFFECTED AREA TWICE A DAY.    ? Cobalamin Combinations (B12 FOLATE) 800-800 MCG CAPS See admin instructions.    ? Continuous Blood Gluc Sensor (FREESTYLE LIBRE 2 SENSOR) MISC .    ? doxycycline (VIBRAMYCIN) 100 MG capsule Take 100 mg  by mouth every 12 (twelve) hours.    ? Dulaglutide (TRULICITY) 3 VU/0.2BX SOPN 3  mg    ? glucose blood (ONETOUCH VERIO) test strip use to check blood sugar    ? Insulin Pen Needle  (B-D ULTRAFINE III SHORT PEN) 31G X 8 MM MISC 3 (three) times daily.    ? insulin regular human CONCENTRATED (HUMULIN R U-500 KWIKPEN) 500 UNIT/ML kwikpen 150 units before breakfast, 80 units before lunch, 80 units before evening meal    ? losartan (COZAAR) 100 MG tablet Take 100 mg by mouth daily.    ? metFORMIN (GLUCOPHAGE-XR) 500 MG 24 hr tablet 2 tablets with evening meal    ? neomycin-polymyxin b-dexamethasone (MAXITROL) 3.5-10000-0.1 SUSP 1 drop 4 (four) times daily.    ? ONETOUCH VERIO test strip 2 (two) times daily.    ? pregabalin (LYRICA) 150 MG capsule 1 capsule    ? tobramycin-dexamethasone (TOBRADEX) ophthalmic solution SMARTSIG:In Eye(s)    ? triamterene-hydrochlorothiazide (MAXZIDE-25) 37.5-25 MG tablet Take 1 tablet by mouth every morning.    ? TRULICITY 1.5 ID/5.6YS SOPN SMARTSIG:0.5 Milliliter(s) SUB-Q Once a Week    ? ?No current facility-administered medications for this visit.  ? ? ?PHYSICAL EXAMINATION: ?ECOG PERFORMANCE STATUS: 1 - Symptomatic but completely ambulatory ? ?Vitals:  ? 10/31/21 1017  ?BP: 130/70  ?Pulse: 86  ?Resp: 17  ?Temp: 97.7 ?F (36.5 ?C)  ?SpO2: 97%  ? ?Filed Weights  ? 10/31/21 1017  ?Weight: 231 lb 12.8 oz (105.1 kg)  ? ? ?BREAST: No palpable masses or nodules in either right or left breasts. No palpable axillary supraclavicular or infraclavicular adenopathy no breast tenderness or nipple discharge. (exam performed in the presence of a chaperone) ? ?LABORATORY DATA:  ?I have reviewed the data as listed ?CMP Latest Ref Rng & Units 04/14/2020 09/13/2017 09/06/2017  ?Glucose 70 - 99 mg/dL 295(H) 274(H) 247(H)  ?BUN 8 - 23 mg/dL _0 ?Creatinine 0.44 - 1.00 mg/dL 1.07(H) 0.87 0.90  ?Sodium 135 - 145 mmol/L 140 138 136  ?Potassium 3.5 - 5.1 mmol/L 3.9 4.3 3.1(L)  ?Chloride 98 - 111 mmol/L 103 107 101  ?CO2 22 - 32 mmol/L 27 21(L) 23  ?Calcium 8.9 - 10.3 mg/dL 9.3 8.8 8.9  ?Total Protein 6.4 - 8.3 g/dL - 6.3(L) 6.9  ?Total Bilirubin 0.2 - 1.2 mg/dL - 0.5 0.8  ?Alkaline Phos 40  - 150 U/L - 71 75  ?AST 5 - 34 U/L - 52(H) 78(H)  ?ALT 0 - 55 U/L - 33 44  ? ? ?Lab Results  ?Component Value Date  ? WBC 3.1 (L) 09/13/2017  ? HGB 8.9 (L) 09/13/2017  ? HCT 27.4 (L) 09/13/2017  ? MCV 91.0 09/13/2017  ? PLT 204 09/13/2017  ? NEUTROABS 1.8 09/13/2017  ? ? ?ASSESSMENT & PLAN:  ?Malignant neoplasm of upper-outer quadrant of left breast in female, estrogen receptor negative ([place]) ?04/30/2017: Left lumpectomy: IDC grade 3, 1.7 cm, DCIS, lymphovascular invasion present, margins negative, 0/1 lymph node negative, ER 0%, PR 0%, HER-2 negative ratio 1.37, Ki-67 40%, T1c N0 stage IB ?   ?Recommendation: ?1. adjuvant chemotherapy with dose dense Adriamycin and Cytoxan ?4 followed by Taxol weekly ?6 discontinued for neuropathy ?2. Followed by radiation started 09/26/2017-10/25/2018  ?--------------------------------------------------------------------------------------------------------------------------- ?Chemo-induced peripheral neuropathy: Currently on gabapentin.  This is especially worse at night. ? I recommended participation in the phase 2 [facility] 2102 study of N-Palmitoylrthanolamide vs placebo for the treatment of chemo induced peripheral neuropathy. ?She will consider going  on the study after her birthday.  She understands that she will have to taper off the gabapentin to go on the study. ? ?Severe fatigue: exercizing. ?Obesity: Encouraged her to work on her diet to lose some weight. ?  ?Breast cancer surveillance: ?1.  Breast exam 10/31/2021: Benign ?2. mammogram 10/03/21: Benign postlumpectomy radiation changes left breast, breast density category B ?  ?Return to clinic in one year ? ? ? ?No orders of the defined types were placed in this encounter. ? ?The patient has a good understanding of the overall plan. she agrees with it. she will call with any problems that may develop before the next visit here. ?Total time spent: 30 mins including face to face time and time spent for planning, charting and  co-ordination of care ? ? Harriette Ohara, MD ?10/31/21 ? ? ? I Deritra, Mcnairy am acting as a scribe for Dr. Lindi Adie  ?I have reviewed the above documentation for accuracy and completeness, and I agree w

## 2021-10-31 NOTE — Assessment & Plan Note (Addendum)
04/30/2017: Left lumpectomy: IDC grade 3, 1.7 cm, DCIS, lymphovascular invasion present, margins negative, 0/1 lymph node negative, ER 0%, PR 0%, HER-2 negative ratio 1.37, Ki-67 40%, T1c N0 stage IB ??? ?Recommendation: ?1. adjuvant chemotherapy with dose dense Adriamycin and Cytoxan ?4 followed by Taxol weekly ?6?discontinued for neuropathy ?2. Followed by radiation?started 09/26/2017-10/25/2018? ?--------------------------------------------------------------------------------------------------------------------------- ?Chemo-induced peripheral neuropathy:?Currently on gabapentin.  This is especially worse at night. ??I recommended participation in the phase 2 [facility]  2102 study of N-Palmitoylrthanolamide vs placebo for the treatment of chemo induced peripheral neuropathy. ?She will consider going on the study after her birthday.  She understands that she will have to taper off the gabapentin to go on the study. ? ?Severe fatigue: exercizing. ?Obesity: Encouraged her to work on her diet to lose some weight. ?? ?Breast cancer surveillance: ?1.??Breast exam?10/31/2021: Benign ?2.?mammogram?10/03/21: Benign postlumpectomy radiation changes left breast, breast density category B ?? ?Return to clinic in?one?year ?

## 2021-11-06 ENCOUNTER — Telehealth: Payer: Self-pay

## 2021-11-06 NOTE — Telephone Encounter (Signed)
[facility]-[place]-2102 - TREATMENT OF ESTABLISHED CHEMOTHERAPY-INDUCED NEUROPATHY WITH N-PALMITOYLETHANOLAMIDE, A CANNABIMIMETIC NUTRACEUTICAL: A RANDOMIZED DOUBLE-BLIND PHASE II PILOT TRIAL ? ?Called patient to follow-up on interest in above-listed study. Patient had several questions regarding the study, which were answered to her satisfaction. Patient requested that research follow-up with her after her birthday; she requested a call on 01/09/22. Informed patient that if she decides to participate, she will need labs, an MD visit, and to be off her pregabalin for 1 week. Patient verbalized understanding. Patient has direct contact information and knows to reach out with any questions or concerns. Otherwise, research will call patient on 5/30 as requested. ? ?Vickii Penna, RN, BSN, CPN ?Clinical Research Nurse I ?[phone] ? ?11/06/2021 10:35 AM ? ?

## 2021-12-11 DIAGNOSIS — R040 Epistaxis: Secondary | ICD-10-CM | POA: Diagnosis not present

## 2021-12-25 DIAGNOSIS — E7800 Pure hypercholesterolemia, unspecified: Secondary | ICD-10-CM | POA: Diagnosis not present

## 2021-12-25 DIAGNOSIS — G4730 Sleep apnea, unspecified: Secondary | ICD-10-CM | POA: Diagnosis not present

## 2021-12-25 DIAGNOSIS — I10 Essential (primary) hypertension: Secondary | ICD-10-CM | POA: Diagnosis not present

## 2022-01-09 ENCOUNTER — Telehealth: Payer: Self-pay

## 2022-01-09 DIAGNOSIS — Z794 Long term (current) use of insulin: Secondary | ICD-10-CM | POA: Diagnosis not present

## 2022-01-09 DIAGNOSIS — E1165 Type 2 diabetes mellitus with hyperglycemia: Secondary | ICD-10-CM | POA: Diagnosis not present

## 2022-01-09 NOTE — Telephone Encounter (Signed)
[facility]-[place]-2102 - TREATMENT OF ESTABLISHED CHEMOTHERAPY-INDUCED NEUROPATHY WITH N-PALMITOYLETHANOLAMIDE, A CANNABIMIMETIC NUTRACEUTICAL: A RANDOMIZED DOUBLE-BLIND PHASE II PILOT TRIAL  Called patient to follow-up on interest in above-listed study. Patient declined participation; stated she tried but could not tolerate being off her pregabalin. Per patient request, provided information about acquiring PEA commercially. MD made aware of patient's screen failure.   Patient thanked for her time and encouraged to contact research with any further questions or concerns.  Vickii Penna, RN, BSN, CPN Clinical Research Nurse I [phone]  01/09/2022 11:08 AM

## 2022-02-06 DIAGNOSIS — H524 Presbyopia: Secondary | ICD-10-CM | POA: Diagnosis not present

## 2022-02-07 DIAGNOSIS — R040 Epistaxis: Secondary | ICD-10-CM | POA: Diagnosis not present

## 2022-02-14 DIAGNOSIS — Z9189 Other specified personal risk factors, not elsewhere classified: Secondary | ICD-10-CM | POA: Diagnosis not present

## 2022-02-14 DIAGNOSIS — Z853 Personal history of malignant neoplasm of breast: Secondary | ICD-10-CM | POA: Diagnosis not present

## 2022-02-14 DIAGNOSIS — N898 Other specified noninflammatory disorders of vagina: Secondary | ICD-10-CM | POA: Diagnosis not present

## 2022-02-21 ENCOUNTER — Other Ambulatory Visit: Payer: Self-pay | Admitting: Obstetrics and Gynecology

## 2022-02-21 DIAGNOSIS — E2839 Other primary ovarian failure: Secondary | ICD-10-CM

## 2022-03-13 ENCOUNTER — Other Ambulatory Visit: Payer: BC Managed Care – PPO

## 2022-04-10 ENCOUNTER — Ambulatory Visit
Admission: RE | Admit: 2022-04-10 | Discharge: 2022-04-10 | Disposition: A | Discharge status: Home / Self Care | Payer: BC Managed Care – PPO | Source: Ambulatory Visit | Attending: Obstetrics and Gynecology | Admitting: Obstetrics and Gynecology

## 2022-04-10 DIAGNOSIS — E2839 Other primary ovarian failure: Secondary | ICD-10-CM

## 2022-04-11 DIAGNOSIS — E1165 Type 2 diabetes mellitus with hyperglycemia: Secondary | ICD-10-CM | POA: Diagnosis not present

## 2022-04-11 DIAGNOSIS — Z794 Long term (current) use of insulin: Secondary | ICD-10-CM | POA: Diagnosis not present

## 2022-07-12 DIAGNOSIS — Z794 Long term (current) use of insulin: Secondary | ICD-10-CM | POA: Diagnosis not present

## 2022-07-12 DIAGNOSIS — E1140 Type 2 diabetes mellitus with diabetic neuropathy, unspecified: Secondary | ICD-10-CM | POA: Diagnosis not present

## 2022-07-12 DIAGNOSIS — Z79899 Other long term (current) drug therapy: Secondary | ICD-10-CM | POA: Diagnosis not present

## 2022-07-12 DIAGNOSIS — E1165 Type 2 diabetes mellitus with hyperglycemia: Secondary | ICD-10-CM | POA: Diagnosis not present

## 2022-08-13 DIAGNOSIS — G629 Polyneuropathy, unspecified: Secondary | ICD-10-CM

## 2022-08-13 HISTORY — DX: Polyneuropathy, unspecified: G62.9

## 2022-09-24 DIAGNOSIS — I10 Essential (primary) hypertension: Secondary | ICD-10-CM | POA: Diagnosis not present

## 2022-09-24 DIAGNOSIS — E1142 Type 2 diabetes mellitus with diabetic polyneuropathy: Secondary | ICD-10-CM | POA: Diagnosis not present

## 2022-09-24 DIAGNOSIS — Z23 Encounter for immunization: Secondary | ICD-10-CM | POA: Diagnosis not present

## 2022-09-24 DIAGNOSIS — C50412 Malignant neoplasm of upper-outer quadrant of left female breast: Secondary | ICD-10-CM | POA: Diagnosis not present

## 2022-09-24 DIAGNOSIS — E7800 Pure hypercholesterolemia, unspecified: Secondary | ICD-10-CM | POA: Diagnosis not present

## 2022-09-24 DIAGNOSIS — Z0000 Encounter for general adult medical examination without abnormal findings: Secondary | ICD-10-CM | POA: Diagnosis not present

## 2022-09-24 DIAGNOSIS — G620 Drug-induced polyneuropathy: Secondary | ICD-10-CM | POA: Diagnosis not present

## 2022-09-24 DIAGNOSIS — Z79899 Other long term (current) drug therapy: Secondary | ICD-10-CM | POA: Diagnosis not present

## 2022-10-18 ENCOUNTER — Other Ambulatory Visit: Payer: Self-pay | Admitting: Internal Medicine

## 2022-10-18 DIAGNOSIS — Z1231 Encounter for screening mammogram for malignant neoplasm of breast: Secondary | ICD-10-CM

## 2022-10-23 ENCOUNTER — Telehealth: Payer: Self-pay | Admitting: Internal Medicine

## 2022-10-23 NOTE — Telephone Encounter (Signed)
Per Lindi Adie being out of the office on the original schedule date, reached out to patient to reschedule. Patient aware of date and time change.

## 2022-11-05 ENCOUNTER — Ambulatory Visit: Payer: BC Managed Care – PPO | Admitting: Hematology and Oncology

## 2022-11-08 ENCOUNTER — Encounter: Payer: Self-pay | Admitting: Hematology and Oncology

## 2022-11-19 NOTE — Progress Notes (Signed)
Patient Care Team: Renford Dills, MD as PCP - General (Internal Medicine) [name], MD as Consulting Physician (Hematology and Oncology) Lonie Peak, MD as Attending Physician (Radiation Oncology) Harriette Bouillon, MD as Consulting Physician (General Surgery) Axel Filler Larna Daughters, NP as Nurse Practitioner (Hematology and Oncology)  DIAGNOSIS:  Encounter Diagnosis  Name Primary?   Malignant neoplasm of upper-outer quadrant of left breast in female, estrogen receptor negative Yes    SUMMARY OF ONCOLOGIC HISTORY: Oncology History  Malignant neoplasm of upper-outer quadrant of left breast in female, estrogen receptor negative  04/05/2017 Initial Diagnosis   Left breast asymmetry by ultrasound measured 1.3 cm at 2:30 position 10 cm from nipple, no axillary lymph nodes; biopsy IDC grade 2, ER 0%, PR 0%, HER-2 negative ratio 1.37, Ki-67 40%, T1c N0 stage IB AJCC 8    04/30/2017 Surgery   Left lumpectomy: IDC grade 3, 1.7 cm, DCIS, lymphovascular invasion present, margins negative, 0/1 lymph node negative, ER 0%, PR 0%, HER-2 negative ratio 1.37, Ki-67 40%, T1c N0 stage IB   05/22/2017 Genetic Testing   Patient had genetic testing due to a personal history of triple negative breast cancer.  The Common Hereditary Cancer Panel was ordered. The Hereditary Gene Panel offered by [facility] includes sequencing and/or deletion duplication testing of the following 46 genes: APC, ATM, AXIN2, BARD1, BMPR1A, BRCA1, BRCA2, BRIP1, CDH1, CDKN2A (p14ARF), CDKN2A (p16INK4a), CHEK2, CTNNA1, DICER1, EPCAM (Deletion/duplication testing only), GREM1 (promoter region deletion/duplication testing only), KIT, MEN1, MLH1, MSH2, MSH3, MSH6, MUTYH, NBN, NF1, NHTL1, PALB2, PDGFRA, PMS2, POLD1, POLE, PTEN, RAD50, RAD51C, RAD51D, SDHB, SDHC, SDHD, SMAD4, SMARCA4. STK11, TP53, TSC1, TSC2, and VHL.  The following genes were evaluated for sequence changes only: SDHA and HOXB13 c.251G>A variant only.    Results: No  pathogenic mutations identified.  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  The date of this test report is 05/22/2017.    05/24/2017 - 08/30/2017 Chemotherapy   Dose dense Adriamycin and Cytoxan 4 followed by Taxol weekly 5 (stopped early for neuropathy)    09/26/2017 - 10/22/2017 Radiation Therapy   Adjuvant radiation therapy     CHIEF COMPLIANT: Follow-up history of breast cancer  INTERVAL HISTORY: Audrey Peters is a 67 y.o. with above-mentioned history of breast cancer. Currently on Lyrica. She reports that she is doing good. The neuropathy is still there. Mainly in the tips of fingers and in the palm of the hands. She has dropped objects likes spoons. She can feel jot and cold. She says it is more of the tingling. She takes gabapentin for relief. She also has in her feet. Her main concern is driving and not knowing when the tingling will start. She does walk for exercise.   ALLERGIES:  is allergic to canagliflozin, empagliflozin, other, sulfa antibiotics, and ciprofloxacin.  MEDICATIONS:  Current Outpatient Medications  Medication Sig Dispense Refill   acetaminophen (TYLENOL) 650 MG CR tablet Take 650 mg by mouth every 8 (eight) hours as needed for pain.     atorvastatin (LIPITOR) 10 MG tablet 1 tablet     clotrimazole (LOTRIMIN) 1 % cream Apply 1 application topically daily as needed (for irritated/itchy skin.).      clotrimazole-betamethasone (LOTRISONE) cream      clotrimazole-betamethasone (LOTRISONE) cream APPLY TO AFFECTED AREA TWICE A DAY.     Cobalamin Combinations (B12 FOLATE) 800-800 MCG CAPS See admin instructions.     Continuous Blood Gluc Sensor (FREESTYLE LIBRE 2 SENSOR) MISC .     doxycycline (VIBRAMYCIN) 100 MG  capsule Take 100 mg by mouth every 12 (twelve) hours.     Dulaglutide (TRULICITY) 3 MG/0.5ML SOPN 3  mg     DULoxetine (CYMBALTA) 30 MG capsule Take 30 mg by mouth daily.     glucose blood (ONETOUCH VERIO) test strip use to check blood sugar      glucose blood test strip by miscellaneous route.     Insulin Pen Needle (B-D ULTRAFINE III SHORT PEN) 31G X 8 MM MISC 3 (three) times daily.     insulin regular human CONCENTRATED (HUMULIN R U-500 KWIKPEN) 500 UNIT/ML kwikpen 150 units before breakfast, 80 units before lunch, 80 units before evening meal     losartan (COZAAR) 100 MG tablet Take 100 mg by mouth daily.     metFORMIN (GLUCOPHAGE-XR) 500 MG 24 hr tablet 2 tablets with evening meal     neomycin-polymyxin b-dexamethasone (MAXITROL) 3.5-10000-0.1 SUSP 1 drop 4 (four) times daily.     ONETOUCH VERIO test strip 2 (two) times daily.     pregabalin (LYRICA) 150 MG capsule 1 capsule     tobramycin-dexamethasone (TOBRADEX) ophthalmic solution SMARTSIG:In Eye(s)     triamterene-hydrochlorothiazide (MAXZIDE-25) 37.5-25 MG tablet Take 1 tablet by mouth every morning.     TRULICITY 1.5 MG/0.5ML SOPN SMARTSIG:0.5 Milliliter(s) SUB-Q Once a Week     No current facility-administered medications for this visit.    PHYSICAL EXAMINATION: ECOG PERFORMANCE STATUS: 1 - Symptomatic but completely ambulatory  Vitals:   11/21/22 1115  BP: 138/84  Pulse: 77  Resp: 18  Temp: (!) 97.2 F (36.2 C)  SpO2: 97%   Filed Weights   11/21/22 1115  Weight: 225 lb 12.8 oz (102.4 kg)    BREAST: No palpable masses or nodules in either right or left breasts. No palpable axillary supraclavicular or infraclavicular adenopathy no breast tenderness or nipple discharge. (exam performed in the presence of a chaperone)  LABORATORY DATA:  I have reviewed the data as listed    Latest Ref Rng & Units 04/14/2020   12:00 PM 09/13/2017    9:23 AM 09/06/2017    8:50 AM  CMP  Glucose 70 - 99 mg/dL 621  308  657   BUN 8 - 23 mg/dL 18  12  13    Creatinine 0.44 - 1.00 mg/dL 8.46  9.62  9.52   Sodium 135 - 145 mmol/L 140  138  136   Potassium 3.5 - 5.1 mmol/L 3.9  4.3  3.1   Chloride 98 - 111 mmol/L 103  107  101   CO2 22 - 32 mmol/L 27  21  23    Calcium 8.9 - 10.3  mg/dL 9.3  8.8  8.9   Total Protein 6.4 - 8.3 g/dL  6.3  6.9   Total Bilirubin 0.2 - 1.2 mg/dL  0.5  0.8   Alkaline Phos 40 - 150 U/L  71  75   AST 5 - 34 U/L  52  78   ALT 0 - 55 U/L  33  44     Lab Results  Component Value Date   WBC 3.1 (L) 09/13/2017   HGB 8.9 (L) 09/13/2017   HCT 27.4 (L) 09/13/2017   MCV 91.0 09/13/2017   PLT 204 09/13/2017   NEUTROABS 1.8 09/13/2017    ASSESSMENT & PLAN:  Malignant neoplasm of upper-outer quadrant of left breast in female, estrogen receptor negative (HCC) 04/30/2017: Left lumpectomy: IDC grade 3, 1.7 cm, DCIS, lymphovascular invasion present, margins negative, 0/1 lymph node negative, ER 0%,  PR 0%, HER-2 negative ratio 1.37, Ki-67 40%, T1c N0 stage IB    Recommendation: 1. adjuvant chemotherapy with dose dense Adriamycin and Cytoxan 4 followed by Taxol weekly 6 discontinued for neuropathy 2. 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  This is especially worse at night.  Severe fatigue: exercizing. Obesity: Encouraged her to work on her diet to lose some weight. Patient's comorbidities for neuropathy also includes diabetes.  I stressed the importance of controlling her weight and blood sugars.   Breast cancer surveillance: 1.  Breast exam 11/21/2022: Benign 2. mammogram scheduled for 12/07/2022   Return to clinic in one year    No orders of the defined types were placed in this encounter.  The patient has a good understanding of the overall plan. she agrees with it. she will call with any problems that may develop before the next visit here. Total time spent: 30 mins including face to face time and time spent for planning, charting and co-ordination of care   Tamsen Meek, MD 11/21/22    I Janan Ridge am acting as a Neurosurgeon for  [employer]  I have reviewed the above documentation for accuracy and completeness, and I agree with the above.

## 2022-11-21 ENCOUNTER — Inpatient Hospital Stay: Payer: BC Managed Care – PPO | Attending: Hematology and Oncology | Admitting: Hematology and Oncology

## 2022-11-21 ENCOUNTER — Other Ambulatory Visit: Payer: Self-pay

## 2022-11-21 VITALS — BP 138/84 | HR 77 | Temp 97.2°F | Resp 18 | Ht 64.0 in | Wt 225.8 lb

## 2022-11-21 DIAGNOSIS — G620 Drug-induced polyneuropathy: Secondary | ICD-10-CM | POA: Insufficient documentation

## 2022-11-21 DIAGNOSIS — Z853 Personal history of malignant neoplasm of breast: Secondary | ICD-10-CM | POA: Diagnosis present

## 2022-11-21 DIAGNOSIS — C50412 Malignant neoplasm of upper-outer quadrant of left female breast: Secondary | ICD-10-CM | POA: Diagnosis not present

## 2022-11-21 DIAGNOSIS — Z171 Estrogen receptor negative status [ER-]: Secondary | ICD-10-CM | POA: Diagnosis not present

## 2022-11-21 DIAGNOSIS — Z79899 Other long term (current) drug therapy: Secondary | ICD-10-CM | POA: Diagnosis not present

## 2022-11-21 NOTE — Assessment & Plan Note (Signed)
04/30/2017: Left lumpectomy: IDC grade 3, 1.7 cm, DCIS, lymphovascular invasion present, margins negative, 0/1 lymph node negative, ER 0%, PR 0%, HER-2 negative ratio 1.37, Ki-67 40%, T1c N0 stage IB    Recommendation: 1. adjuvant chemotherapy with dose dense Adriamycin and Cytoxan 4 followed by Taxol weekly 6 discontinued for neuropathy 2. 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  This is especially worse at night.  Severe fatigue: exercizing. Obesity: Encouraged her to work on her diet to lose some weight.   Breast cancer surveillance: 1.  Breast exam 11/21/2022: Benign 2. mammogram scheduled for 12/07/2022   Return to clinic in one year

## 2022-12-07 ENCOUNTER — Ambulatory Visit
Admission: RE | Admit: 2022-12-07 | Discharge: 2022-12-07 | Disposition: A | Discharge status: Home / Self Care | Payer: BC Managed Care – PPO | Source: Ambulatory Visit | Attending: Internal Medicine | Admitting: Internal Medicine

## 2022-12-07 DIAGNOSIS — Z1231 Encounter for screening mammogram for malignant neoplasm of breast: Secondary | ICD-10-CM | POA: Diagnosis not present

## 2022-12-11 ENCOUNTER — Other Ambulatory Visit: Payer: Self-pay | Admitting: Internal Medicine

## 2022-12-11 DIAGNOSIS — R928 Other abnormal and inconclusive findings on diagnostic imaging of breast: Secondary | ICD-10-CM

## 2022-12-26 ENCOUNTER — Ambulatory Visit
Admission: RE | Admit: 2022-12-26 | Discharge: 2022-12-26 | Disposition: A | Discharge status: Home / Self Care | Payer: BC Managed Care – PPO | Source: Ambulatory Visit | Attending: Internal Medicine | Admitting: Internal Medicine

## 2022-12-26 ENCOUNTER — Other Ambulatory Visit: Payer: Self-pay | Admitting: Internal Medicine

## 2022-12-26 DIAGNOSIS — N63 Unspecified lump in unspecified breast: Secondary | ICD-10-CM | POA: Diagnosis not present

## 2022-12-26 DIAGNOSIS — R928 Other abnormal and inconclusive findings on diagnostic imaging of breast: Secondary | ICD-10-CM

## 2022-12-26 DIAGNOSIS — R922 Inconclusive mammogram: Secondary | ICD-10-CM | POA: Diagnosis not present

## 2022-12-26 DIAGNOSIS — N6320 Unspecified lump in the left breast, unspecified quadrant: Secondary | ICD-10-CM

## 2022-12-26 DIAGNOSIS — Z853 Personal history of malignant neoplasm of breast: Secondary | ICD-10-CM | POA: Diagnosis not present

## 2022-12-26 DIAGNOSIS — N6489 Other specified disorders of breast: Secondary | ICD-10-CM | POA: Diagnosis not present

## 2023-01-01 ENCOUNTER — Ambulatory Visit
Admission: RE | Admit: 2023-01-01 | Discharge: 2023-01-01 | Disposition: A | Discharge status: Home / Self Care | Payer: Medicare Other | Source: Ambulatory Visit | Attending: Internal Medicine | Admitting: Internal Medicine

## 2023-01-01 ENCOUNTER — Encounter: Payer: Self-pay | Admitting: Hematology and Oncology

## 2023-01-01 DIAGNOSIS — R928 Other abnormal and inconclusive findings on diagnostic imaging of breast: Secondary | ICD-10-CM

## 2023-01-01 DIAGNOSIS — N6320 Unspecified lump in the left breast, unspecified quadrant: Secondary | ICD-10-CM

## 2023-01-01 DIAGNOSIS — D0512 Intraductal carcinoma in situ of left breast: Secondary | ICD-10-CM | POA: Diagnosis not present

## 2023-01-01 HISTORY — PX: BREAST BIOPSY: SHX20

## 2023-01-08 ENCOUNTER — Telehealth: Payer: Self-pay | Admitting: Hematology and Oncology

## 2023-01-08 NOTE — Telephone Encounter (Signed)
Scheduled appointment per scheduling message. Patient is aware of the made appointment. 

## 2023-01-09 ENCOUNTER — Encounter: Payer: Self-pay | Admitting: Hematology and Oncology

## 2023-01-09 ENCOUNTER — Inpatient Hospital Stay: Payer: BC Managed Care – PPO | Attending: Hematology and Oncology | Admitting: Hematology and Oncology

## 2023-01-09 ENCOUNTER — Other Ambulatory Visit: Payer: Self-pay

## 2023-01-09 VITALS — BP 132/74 | HR 89 | Temp 97.9°F | Resp 18 | Ht 64.0 in | Wt 229.0 lb

## 2023-01-09 DIAGNOSIS — G620 Drug-induced polyneuropathy: Secondary | ICD-10-CM | POA: Diagnosis not present

## 2023-01-09 DIAGNOSIS — Z171 Estrogen receptor negative status [ER-]: Secondary | ICD-10-CM | POA: Diagnosis not present

## 2023-01-09 DIAGNOSIS — T451X5A Adverse effect of antineoplastic and immunosuppressive drugs, initial encounter: Secondary | ICD-10-CM | POA: Diagnosis not present

## 2023-01-09 DIAGNOSIS — C50412 Malignant neoplasm of upper-outer quadrant of left female breast: Secondary | ICD-10-CM

## 2023-01-09 NOTE — Assessment & Plan Note (Addendum)
04/30/2017: Left lumpectomy: IDC grade 3, 1.7 cm, DCIS, lymphovascular invasion present, margins negative, 0/1 lymph node negative, ER 0%, PR 0%, HER-2 negative ratio 1.37, Ki-67 40%, T1c N0 stage IB    Recommendation: 1. adjuvant chemotherapy with dose dense Adriamycin and Cytoxan 4 followed by Taxol weekly 6 discontinued for neuropathy 2. 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  This is especially worse at night.   Severe fatigue: exercizing.   Breast cancer recurrence: mammogram 12/10/2022: Asymmetry/distortion left breast, ultrasound: 2 adjacent irregular masses 2 o'clock position left breast 1.9 cm and 1.2 cm, no suspicious lymph nodes Left breast biopsy 2:00 posterior: Grade 2 IDC with DCIS Left breast biopsy 2:00 anterior: Grade 2 IDC with DCIS ER 0%, PR 0%, HER2 0, Ki-67 30%  Recommendation: Left mastectomy Followed by adjuvant chemotherapy with CMF x 6 cycles Staging scans

## 2023-01-09 NOTE — Progress Notes (Signed)
Patient Care Team: Renford Dills, MD as PCP - General (Internal Medicine) [name], MD as Consulting Physician (Hematology and Oncology) Lonie Peak, MD as Attending Physician (Radiation Oncology) Harriette Bouillon, MD as Consulting Physician (General Surgery) Axel Filler Larna Daughters, NP as Nurse Practitioner (Hematology and Oncology)  DIAGNOSIS:  Encounter Diagnosis  Name Primary?   Malignant neoplasm of upper-outer quadrant of left breast in female, estrogen receptor negative (HCC) Yes    SUMMARY OF ONCOLOGIC HISTORY: Oncology History  Malignant neoplasm of upper-outer quadrant of left breast in female, estrogen receptor negative (HCC)  04/05/2017 Initial Diagnosis   Left breast asymmetry by ultrasound measured 1.3 cm at 2:30 position 10 cm from nipple, no axillary lymph nodes; biopsy IDC grade 2, ER 0%, PR 0%, HER-2 negative ratio 1.37, Ki-67 40%, T1c N0 stage IB AJCC 8    04/30/2017 Surgery   Left lumpectomy: IDC grade 3, 1.7 cm, DCIS, lymphovascular invasion present, margins negative, 0/1 lymph node negative, ER 0%, PR 0%, HER-2 negative ratio 1.37, Ki-67 40%, T1c N0 stage IB   05/22/2017 Genetic Testing   Patient had genetic testing due to a personal history of triple negative breast cancer.  The Common Hereditary Cancer Panel was ordered. The Hereditary Gene Panel offered by [facility] includes sequencing and/or deletion duplication testing of the following 46 genes: APC, ATM, AXIN2, BARD1, BMPR1A, BRCA1, BRCA2, BRIP1, CDH1, CDKN2A (p14ARF), CDKN2A (p16INK4a), CHEK2, CTNNA1, DICER1, EPCAM (Deletion/duplication testing only), GREM1 (promoter region deletion/duplication testing only), KIT, MEN1, MLH1, MSH2, MSH3, MSH6, MUTYH, NBN, NF1, NHTL1, PALB2, PDGFRA, PMS2, POLD1, POLE, PTEN, RAD50, RAD51C, RAD51D, SDHB, SDHC, SDHD, SMAD4, SMARCA4. STK11, TP53, TSC1, TSC2, and VHL.  The following genes were evaluated for sequence changes only: SDHA and HOXB13 c.251G>A variant only.     Results: No pathogenic mutations identified.  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  The date of this test report is 05/22/2017.    05/24/2017 - 08/30/2017 Chemotherapy   Dose dense Adriamycin and Cytoxan 4 followed by Taxol weekly 5 (stopped early for neuropathy)    09/26/2017 - 10/22/2017 Radiation Therapy   Adjuvant radiation therapy     CHIEF COMPLIANT: Recurrence breast cancer  INTERVAL HISTORY: Audrey Peters is a 67 y.o. with above-mentioned history of breast cancer.  She presents to the clinic for a follow-up for a recurrence.  She had a routine screening mammogram in April that revealed a couple of abnormalities in the left breast which led to additional mammograms ultrasounds and biopsies.  She is here today to discuss the findings of the biopsies and to discuss her treatment plan.  She was presented this morning to the multidisciplinary tumor board.   ALLERGIES:  is allergic to canagliflozin, empagliflozin, other, sulfa antibiotics, and ciprofloxacin.  MEDICATIONS:  Current Outpatient Medications  Medication Sig Dispense Refill   acetaminophen (TYLENOL) 650 MG CR tablet Take 650 mg by mouth every 8 (eight) hours as needed for pain.     atorvastatin (LIPITOR) 10 MG tablet 1 tablet     clotrimazole (LOTRIMIN) 1 % cream Apply 1 application topically daily as needed (for irritated/itchy skin.).      clotrimazole-betamethasone (LOTRISONE) cream      clotrimazole-betamethasone (LOTRISONE) cream APPLY TO AFFECTED AREA TWICE A DAY.     Cobalamin Combinations (B12 FOLATE) 800-800 MCG CAPS See admin instructions.     Continuous Blood Gluc Sensor (FREESTYLE LIBRE 2 SENSOR) MISC .     doxycycline (VIBRAMYCIN) 100 MG capsule Take 100 mg by mouth every 12 (twelve)  hours.     Dulaglutide (TRULICITY) 3 MG/0.5ML SOPN 3  mg     DULoxetine (CYMBALTA) 30 MG capsule Take 30 mg by mouth daily.     glucose blood (ONETOUCH VERIO) test strip use to check blood sugar     glucose  blood test strip by miscellaneous route.     Insulin Pen Needle (B-D ULTRAFINE III SHORT PEN) 31G X 8 MM MISC 3 (three) times daily.     insulin regular human CONCENTRATED (HUMULIN R U-500 KWIKPEN) 500 UNIT/ML kwikpen 150 units before breakfast, 80 units before lunch, 80 units before evening meal     losartan (COZAAR) 100 MG tablet Take 100 mg by mouth daily.     metFORMIN (GLUCOPHAGE-XR) 500 MG 24 hr tablet 2 tablets with evening meal     neomycin-polymyxin b-dexamethasone (MAXITROL) 3.5-10000-0.1 SUSP 1 drop 4 (four) times daily.     ONETOUCH VERIO test strip 2 (two) times daily.     pregabalin (LYRICA) 150 MG capsule 1 capsule     tobramycin-dexamethasone (TOBRADEX) ophthalmic solution SMARTSIG:In Eye(s)     triamterene-hydrochlorothiazide (MAXZIDE-25) 37.5-25 MG tablet Take 1 tablet by mouth every morning.     TRULICITY 1.5 MG/0.5ML SOPN SMARTSIG:0.5 Milliliter(s) SUB-Q Once a Week     No current facility-administered medications for this visit.    PHYSICAL EXAMINATION: ECOG PERFORMANCE STATUS: 1 - Symptomatic but completely ambulatory  Vitals:   01/09/23 0810  BP: 132/74  Pulse: 89  Resp: 18  Temp: 97.9 F (36.6 C)  SpO2: 97%   Filed Weights   01/09/23 0810  Weight: 229 lb (103.9 kg)      LABORATORY DATA:  I have reviewed the data as listed    Latest Ref Rng & Units 04/14/2020   12:00 PM 09/13/2017    9:23 AM 09/06/2017    8:50 AM  CMP  Glucose 70 - 99 mg/dL 981  191  478   BUN 8 - 23 mg/dL 18  12  13    Creatinine 0.44 - 1.00 mg/dL 2.95  6.21  3.08   Sodium 135 - 145 mmol/L 140  138  136   Potassium 3.5 - 5.1 mmol/L 3.9  4.3  3.1   Chloride 98 - 111 mmol/L 103  107  101   CO2 22 - 32 mmol/L 27  21  23    Calcium 8.9 - 10.3 mg/dL 9.3  8.8  8.9   Total Protein 6.4 - 8.3 g/dL  6.3  6.9   Total Bilirubin 0.2 - 1.2 mg/dL  0.5  0.8   Alkaline Phos 40 - 150 U/L  71  75   AST 5 - 34 U/L  52  78   ALT 0 - 55 U/L  33  44     Lab Results  Component Value Date   WBC 3.1  (L) 09/13/2017   HGB 8.9 (L) 09/13/2017   HCT 27.4 (L) 09/13/2017   MCV 91.0 09/13/2017   PLT 204 09/13/2017   NEUTROABS 1.8 09/13/2017    ASSESSMENT & PLAN:  Malignant neoplasm of upper-outer quadrant of left breast in female, estrogen receptor negative (HCC) 04/30/2017: Left lumpectomy: IDC grade 3, 1.7 cm, DCIS, lymphovascular invasion present, margins negative, 0/1 lymph node negative, ER 0%, PR 0%, HER-2 negative ratio 1.37, Ki-67 40%, T1c N0 stage IB    Recommendation: 1. adjuvant chemotherapy with dose dense Adriamycin and Cytoxan 4 followed by Taxol weekly 6 discontinued for neuropathy 2. Followed by radiation started 09/26/2017-10/25/2018  --------------------------------------------------------------------------------------------------------------------------- Chemo-induced peripheral  neuropathy: Currently on gabapentin.  This is especially worse at night.   Severe fatigue: exercizing. Obesity: Encouraged her to work on her diet to lose some weight. Patient's comorbidities for neuropathy also includes diabetes.  I stressed the importance of controlling her weight and blood sugars.   Breast cancer recurrence: mammogram 12/10/2022: Asymmetry/distortion left breast, ultrasound: 2 adjacent irregular masses 2 o'clock position left breast 1.9 cm and 1.2 cm, no suspicious lymph nodes Left breast biopsy 2:00 posterior: Grade 2 IDC with DCIS Left breast biopsy 2:00 anterior: Grade 2 IDC with DCIS ER 0%, PR 0%, HER2 0, Ki-67 30%  Recommendation: Left mastectomy Followed by adjuvant chemotherapy with CMF x 6 cycles Staging scans  Return to clinic after scans to discuss results.      Orders Placed This Encounter  Procedures   CT CHEST ABDOMEN PELVIS W CONTRAST    Standing Status:   Future    Standing Expiration Date:   01/09/2024    Order Specific Question:   If indicated for the ordered procedure, I authorize the administration of contrast media per Radiology protocol     Answer:   Yes    Order Specific Question:   Does the patient have a contrast media/X-ray dye allergy?    Answer:   No    Order Specific Question:   Preferred imaging location?    Answer:   [facility]    Order Specific Question:   If indicated for the ordered procedure, I authorize the administration of oral contrast media per Radiology protocol    Answer:   Yes   NM Bone Scan Whole Body    Standing Status:   Future    Standing Expiration Date:   01/09/2024    Order Specific Question:   If indicated for the ordered procedure, I authorize the administration of a radiopharmaceutical per Radiology protocol    Answer:   Yes    Order Specific Question:   Preferred imaging location?    Answer:   [facility]   The patient has a good understanding of the overall plan. she agrees with it. she will call with any problems that may develop before the next visit here. Total time spent: 30 mins including face to face time and time spent for planning, charting and co-ordination of care   Tamsen Meek, MD 01/09/23    I Janan Ridge am acting as a Neurosurgeon for [employer]  I have reviewed the above documentation for accuracy and completeness, and I agree with the above.

## 2023-01-18 ENCOUNTER — Other Ambulatory Visit: Payer: Self-pay | Admitting: General Surgery

## 2023-01-18 DIAGNOSIS — C50412 Malignant neoplasm of upper-outer quadrant of left female breast: Secondary | ICD-10-CM | POA: Diagnosis not present

## 2023-01-18 DIAGNOSIS — R591 Generalized enlarged lymph nodes: Secondary | ICD-10-CM | POA: Diagnosis not present

## 2023-01-18 DIAGNOSIS — Z853 Personal history of malignant neoplasm of breast: Secondary | ICD-10-CM | POA: Diagnosis not present

## 2023-01-18 DIAGNOSIS — E119 Type 2 diabetes mellitus without complications: Secondary | ICD-10-CM | POA: Diagnosis not present

## 2023-01-21 ENCOUNTER — Other Ambulatory Visit: Payer: Self-pay | Admitting: General Surgery

## 2023-01-21 ENCOUNTER — Ambulatory Visit
Admission: RE | Admit: 2023-01-21 | Discharge: 2023-01-21 | Disposition: A | Discharge status: Home / Self Care | Payer: Medicare Other | Source: Ambulatory Visit | Attending: General Surgery | Admitting: General Surgery

## 2023-01-21 DIAGNOSIS — R590 Localized enlarged lymph nodes: Secondary | ICD-10-CM | POA: Diagnosis not present

## 2023-01-21 DIAGNOSIS — Z794 Long term (current) use of insulin: Secondary | ICD-10-CM | POA: Diagnosis not present

## 2023-01-21 DIAGNOSIS — R591 Generalized enlarged lymph nodes: Secondary | ICD-10-CM

## 2023-01-21 DIAGNOSIS — E1140 Type 2 diabetes mellitus with diabetic neuropathy, unspecified: Secondary | ICD-10-CM | POA: Diagnosis not present

## 2023-01-21 DIAGNOSIS — C50412 Malignant neoplasm of upper-outer quadrant of left female breast: Secondary | ICD-10-CM

## 2023-01-21 DIAGNOSIS — R599 Enlarged lymph nodes, unspecified: Secondary | ICD-10-CM | POA: Diagnosis not present

## 2023-01-21 DIAGNOSIS — H9209 Otalgia, unspecified ear: Secondary | ICD-10-CM | POA: Diagnosis not present

## 2023-01-21 DIAGNOSIS — E1165 Type 2 diabetes mellitus with hyperglycemia: Secondary | ICD-10-CM | POA: Diagnosis not present

## 2023-01-22 DIAGNOSIS — C50412 Malignant neoplasm of upper-outer quadrant of left female breast: Secondary | ICD-10-CM | POA: Diagnosis not present

## 2023-01-22 DIAGNOSIS — K1120 Sialoadenitis, unspecified: Secondary | ICD-10-CM | POA: Diagnosis not present

## 2023-01-23 ENCOUNTER — Encounter: Payer: Self-pay | Admitting: General Surgery

## 2023-01-23 NOTE — Progress Notes (Signed)
Patient Care Team: Renford Dills, MD as PCP - General (Internal Medicine) [name], MD as Consulting Physician (Hematology and Oncology) Lonie Peak, MD as Attending Physician (Radiation Oncology) Harriette Bouillon, MD as Consulting Physician (General Surgery) Axel Filler Larna Daughters, NP as Nurse Practitioner (Hematology and Oncology)  DIAGNOSIS: No diagnosis found.  SUMMARY OF ONCOLOGIC HISTORY: Oncology History  Malignant neoplasm of upper-outer quadrant of left breast in female, estrogen receptor negative (HCC)  04/05/2017 Initial Diagnosis   Left breast asymmetry by ultrasound measured 1.3 cm at 2:30 position 10 cm from nipple, no axillary lymph nodes; biopsy IDC grade 2, ER 0%, PR 0%, HER-2 negative ratio 1.37, Ki-67 40%, T1c N0 stage IB AJCC 8    04/30/2017 Surgery   Left lumpectomy: IDC grade 3, 1.7 cm, DCIS, lymphovascular invasion present, margins negative, 0/1 lymph node negative, ER 0%, PR 0%, HER-2 negative ratio 1.37, Ki-67 40%, T1c N0 stage IB   05/22/2017 Genetic Testing   Patient had genetic testing due to a personal history of triple negative breast cancer.  The Common Hereditary Cancer Panel was ordered. The Hereditary Gene Panel offered by [facility] includes sequencing and/or deletion duplication testing of the following 46 genes: APC, ATM, AXIN2, BARD1, BMPR1A, BRCA1, BRCA2, BRIP1, CDH1, CDKN2A (p14ARF), CDKN2A (p16INK4a), CHEK2, CTNNA1, DICER1, EPCAM (Deletion/duplication testing only), GREM1 (promoter region deletion/duplication testing only), KIT, MEN1, MLH1, MSH2, MSH3, MSH6, MUTYH, NBN, NF1, NHTL1, PALB2, PDGFRA, PMS2, POLD1, POLE, PTEN, RAD50, RAD51C, RAD51D, SDHB, SDHC, SDHD, SMAD4, SMARCA4. STK11, TP53, TSC1, TSC2, and VHL.  The following genes were evaluated for sequence changes only: SDHA and HOXB13 c.251G>A variant only.    Results: No pathogenic mutations identified.  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  The date of this test report is  05/22/2017.    05/24/2017 - 08/30/2017 Chemotherapy   Dose dense Adriamycin and Cytoxan 4 followed by Taxol weekly 5 (stopped early for neuropathy)    09/26/2017 - 10/22/2017 Radiation Therapy   Adjuvant radiation therapy     CHIEF COMPLIANT: Follow-up after scans  INTERVAL HISTORY: Audrey Peters is a  67 y.o. with above-mentioned history of breast cancer.  She presents to the clinic for a follow-up   ALLERGIES:  is allergic to canagliflozin, empagliflozin, other, sulfa antibiotics, and ciprofloxacin.  MEDICATIONS:  Current Outpatient Medications  Medication Sig Dispense Refill   acetaminophen (TYLENOL) 650 MG CR tablet Take 650 mg by mouth every 8 (eight) hours as needed for pain.     atorvastatin (LIPITOR) 10 MG tablet 1 tablet     clotrimazole (LOTRIMIN) 1 % cream Apply 1 application topically daily as needed (for irritated/itchy skin.).      clotrimazole-betamethasone (LOTRISONE) cream      clotrimazole-betamethasone (LOTRISONE) cream APPLY TO AFFECTED AREA TWICE A DAY.     Cobalamin Combinations (B12 FOLATE) 800-800 MCG CAPS See admin instructions.     Continuous Blood Gluc Sensor (FREESTYLE LIBRE 2 SENSOR) MISC .     doxycycline (VIBRAMYCIN) 100 MG capsule Take 100 mg by mouth every 12 (twelve) hours.     Dulaglutide (TRULICITY) 3 MG/0.5ML SOPN 3  mg     DULoxetine (CYMBALTA) 30 MG capsule Take 30 mg by mouth daily.     glucose blood (ONETOUCH VERIO) test strip use to check blood sugar     glucose blood test strip by miscellaneous route.     Insulin Pen Needle (B-D ULTRAFINE III SHORT PEN) 31G X 8 MM MISC 3 (three) times daily.     insulin  regular human CONCENTRATED (HUMULIN R U-500 KWIKPEN) 500 UNIT/ML kwikpen 150 units before breakfast, 80 units before lunch, 80 units before evening meal     losartan (COZAAR) 100 MG tablet Take 100 mg by mouth daily.     metFORMIN (GLUCOPHAGE-XR) 500 MG 24 hr tablet 2 tablets with evening meal     neomycin-polymyxin b-dexamethasone  (MAXITROL) 3.5-10000-0.1 SUSP 1 drop 4 (four) times daily.     ONETOUCH VERIO test strip 2 (two) times daily.     pregabalin (LYRICA) 150 MG capsule 1 capsule     tobramycin-dexamethasone (TOBRADEX) ophthalmic solution SMARTSIG:In Eye(s)     triamterene-hydrochlorothiazide (MAXZIDE-25) 37.5-25 MG tablet Take 1 tablet by mouth every morning.     TRULICITY 1.5 MG/0.5ML SOPN SMARTSIG:0.5 Milliliter(s) SUB-Q Once a Week     No current facility-administered medications for this visit.    PHYSICAL EXAMINATION: ECOG PERFORMANCE STATUS: {CHL ONC ECOG PS:[phone]}  There were no vitals filed for this visit. There were no vitals filed for this visit.  BREAST:*** No palpable masses or nodules in either right or left breasts. No palpable axillary supraclavicular or infraclavicular adenopathy no breast tenderness or nipple discharge. (exam performed in the presence of a chaperone)  LABORATORY DATA:  I have reviewed the data as listed    Latest Ref Rng & Units 04/14/2020   12:00 PM 09/13/2017    9:23 AM 09/06/2017    8:50 AM  CMP  Glucose 70 - 99 mg/dL 409  811  914   BUN 8 - 23 mg/dL 18  12  13    Creatinine 0.44 - 1.00 mg/dL 7.82  9.56  2.13   Sodium 135 - 145 mmol/L 140  138  136   Potassium 3.5 - 5.1 mmol/L 3.9  4.3  3.1   Chloride 98 - 111 mmol/L 103  107  101   CO2 22 - 32 mmol/L 27  21  23    Calcium 8.9 - 10.3 mg/dL 9.3  8.8  8.9   Total Protein 6.4 - 8.3 g/dL  6.3  6.9   Total Bilirubin 0.2 - 1.2 mg/dL  0.5  0.8   Alkaline Phos 40 - 150 U/L  71  75   AST 5 - 34 U/L  52  78   ALT 0 - 55 U/L  33  44     Lab Results  Component Value Date   WBC 3.1 (L) 09/13/2017   HGB 8.9 (L) 09/13/2017   HCT 27.4 (L) 09/13/2017   MCV 91.0 09/13/2017   PLT 204 09/13/2017   NEUTROABS 1.8 09/13/2017    ASSESSMENT & PLAN:  No problem-specific Assessment & Plan notes found for this encounter.    No orders of the defined types were placed in this encounter.  The patient has a good  understanding of the overall plan. she agrees with it. she will call with any problems that may develop before the next visit here. Total time spent: 30 mins including face to face time and time spent for planning, charting and co-ordination of care   Sherlyn Lick, CMA 01/23/23    I Janan Ridge am acting as a Neurosurgeon for [employer]  ***

## 2023-01-24 ENCOUNTER — Ambulatory Visit (HOSPITAL_COMMUNITY)
Admission: RE | Admit: 2023-01-24 | Discharge: 2023-01-24 | Disposition: A | Discharge status: Home / Self Care | Payer: Medicare Other | Source: Ambulatory Visit | Attending: Hematology and Oncology | Admitting: Hematology and Oncology

## 2023-01-24 ENCOUNTER — Ambulatory Visit
Admission: RE | Admit: 2023-01-24 | Discharge: 2023-01-24 | Disposition: A | Discharge status: Home / Self Care | Payer: Medicare Other | Source: Ambulatory Visit | Attending: General Surgery | Admitting: General Surgery

## 2023-01-24 DIAGNOSIS — C50412 Malignant neoplasm of upper-outer quadrant of left female breast: Secondary | ICD-10-CM | POA: Diagnosis not present

## 2023-01-24 DIAGNOSIS — C50912 Malignant neoplasm of unspecified site of left female breast: Secondary | ICD-10-CM | POA: Diagnosis not present

## 2023-01-24 DIAGNOSIS — R92323 Mammographic fibroglandular density, bilateral breasts: Secondary | ICD-10-CM | POA: Diagnosis not present

## 2023-01-24 DIAGNOSIS — Z171 Estrogen receptor negative status [ER-]: Secondary | ICD-10-CM | POA: Insufficient documentation

## 2023-01-24 DIAGNOSIS — C50919 Malignant neoplasm of unspecified site of unspecified female breast: Secondary | ICD-10-CM | POA: Diagnosis not present

## 2023-01-24 DIAGNOSIS — K7689 Other specified diseases of liver: Secondary | ICD-10-CM | POA: Diagnosis not present

## 2023-01-24 LAB — POCT I-STAT CREATININE: Creatinine, Ser: 1 mg/dL (ref 0.44–1.00)

## 2023-01-24 MED ORDER — IOHEXOL 300 MG/ML  SOLN
100.0000 mL | Freq: Once | INTRAMUSCULAR | Status: AC | PRN
  Administered 2023-01-24: 100 mL via INTRAVENOUS

## 2023-01-24 MED ORDER — GADOPICLENOL 0.5 MMOL/ML IV SOLN
10.0000 mL | Freq: Once | INTRAVENOUS | Status: AC | PRN
  Administered 2023-01-24: 10 mL via INTRAVENOUS

## 2023-01-24 MED ORDER — GADOPICLENOL 0.5 MMOL/ML IV SOLN: 10.0000 mL | Freq: Once | INTRAVENOUS | Status: DC | PRN

## 2023-01-25 ENCOUNTER — Encounter (HOSPITAL_COMMUNITY)
Admission: RE | Admit: 2023-01-25 | Discharge: 2023-01-25 | Disposition: A | Discharge status: Home / Self Care | Payer: Medicare Other | Source: Ambulatory Visit | Attending: Hematology and Oncology | Admitting: Hematology and Oncology

## 2023-01-25 DIAGNOSIS — C50412 Malignant neoplasm of upper-outer quadrant of left female breast: Secondary | ICD-10-CM | POA: Diagnosis not present

## 2023-01-25 DIAGNOSIS — C50919 Malignant neoplasm of unspecified site of unspecified female breast: Secondary | ICD-10-CM | POA: Diagnosis not present

## 2023-01-25 DIAGNOSIS — Z171 Estrogen receptor negative status [ER-]: Secondary | ICD-10-CM | POA: Diagnosis not present

## 2023-01-25 DIAGNOSIS — Z923 Personal history of irradiation: Secondary | ICD-10-CM | POA: Diagnosis not present

## 2023-01-25 MED ORDER — TECHNETIUM TC 99M MEDRONATE IV KIT
20.0000 | PACK | Freq: Once | INTRAVENOUS | Status: AC | PRN
  Administered 2023-01-25: 21 via INTRAVENOUS

## 2023-02-01 ENCOUNTER — Other Ambulatory Visit: Payer: Self-pay | Admitting: General Surgery

## 2023-02-04 ENCOUNTER — Other Ambulatory Visit: Payer: Self-pay

## 2023-02-04 ENCOUNTER — Inpatient Hospital Stay: Payer: Medicare Other | Attending: Hematology and Oncology | Admitting: Hematology and Oncology

## 2023-02-04 ENCOUNTER — Inpatient Hospital Stay: Payer: Medicare Other

## 2023-02-04 VITALS — BP 143/77 | HR 86 | Temp 97.9°F | Resp 18 | Ht 64.0 in | Wt 227.9 lb

## 2023-02-04 DIAGNOSIS — Z171 Estrogen receptor negative status [ER-]: Secondary | ICD-10-CM | POA: Diagnosis not present

## 2023-02-04 DIAGNOSIS — C50412 Malignant neoplasm of upper-outer quadrant of left female breast: Secondary | ICD-10-CM | POA: Diagnosis not present

## 2023-02-04 LAB — CMP (CANCER CENTER ONLY)
ALT: 24 U/L (ref 0–44)
AST: 34 U/L (ref 15–41)
Albumin: 3.7 g/dL (ref 3.5–5.0)
Alkaline Phosphatase: 62 U/L (ref 38–126)
Anion gap: 6 (ref 5–15)
BUN: 20 mg/dL (ref 8–23)
CO2: 28 mmol/L (ref 22–32)
Calcium: 9.1 mg/dL (ref 8.9–10.3)
Chloride: 104 mmol/L (ref 98–111)
Creatinine: 0.89 mg/dL (ref 0.44–1.00)
GFR, Estimated: >60 mL/min (ref >60)
Glucose, Bld: 165 mg/dL — ABNORMAL HIGH (ref 70–99)
Potassium: 3.7 mmol/L (ref 3.5–5.1)
Sodium: 138 mmol/L (ref 135–145)
Total Bilirubin: 0.6 mg/dL (ref 0.3–1.2)
Total Protein: 6.8 g/dL (ref 6.5–8.1)

## 2023-02-04 LAB — CBC WITH DIFFERENTIAL (CANCER CENTER ONLY)
Abs Immature Granulocytes: 0.02 10*3/uL (ref 0.00–0.07)
Basophils Absolute: 0 10*3/uL (ref 0.0–0.1)
Basophils Relative: 0 %
Eosinophils Absolute: 0.1 10*3/uL (ref 0.0–0.5)
Eosinophils Relative: 2 %
HCT: 38.6 % (ref 36.0–46.0)
Hemoglobin: 12.2 g/dL (ref 12.0–15.0)
Immature Granulocytes: 0 %
Lymphocytes Relative: 39 %
Lymphs Abs: 2.1 10*3/uL (ref 0.7–4.0)
MCH: 27.9 pg (ref 26.0–34.0)
MCHC: 31.6 g/dL (ref 30.0–36.0)
MCV: 88.1 fL (ref 80.0–100.0)
Monocytes Absolute: 0.5 10*3/uL (ref 0.1–1.0)
Monocytes Relative: 9 %
Neutro Abs: 2.7 10*3/uL (ref 1.7–7.7)
Neutrophils Relative %: 50 %
Platelet Count: 178 10*3/uL (ref 150–400)
RBC: 4.38 MIL/uL (ref 3.87–5.11)
RDW: 12.8 % (ref 11.5–15.5)
WBC Count: 5.4 10*3/uL (ref 4.0–10.5)
nRBC: 0 % (ref 0.0–0.2)

## 2023-02-04 NOTE — Assessment & Plan Note (Signed)
04/30/2017: Left lumpectomy: IDC grade 3, 1.7 cm, DCIS, lymphovascular invasion present, margins negative, 0/1 lymph node negative, ER 0%, PR 0%, HER-2 negative ratio 1.37, Ki-67 40%, T1c N0 stage IB    Recommendation: 1. adjuvant chemotherapy with dose dense Adriamycin and Cytoxan 4 followed by Taxol weekly 6 discontinued for neuropathy 2. 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  This is especially worse at night.   Severe fatigue: exercizing. Obesity: Encouraged her to work on her diet to lose some weight. Patient's comorbidities for neuropathy also includes diabetes.  I stressed the importance of controlling her weight and blood sugars.   Breast cancer recurrence: mammogram 12/10/2022: Asymmetry/distortion left breast, ultrasound: 2 adjacent irregular masses 2 o'clock position left breast 1.9 cm and 1.2 cm, no suspicious lymph nodes Left breast biopsy 2:00 posterior: Grade 2 IDC with DCIS Left breast biopsy 2:00 anterior: Grade 2 IDC with DCIS ER 0%, PR 0%, HER2 0, Ki-67 30% 01/31/2023: Bone scan: Negative, CT CAP 01/26/2023: Negative for metastatic disease but nodular contour of the liver suggestive of cirrhosis 01/26/2023: Ultrasound left submandibular neck: Normal lymph node 1.2 cm 01/24/2023: MRI breast: The recently biopsied area measured 6.1 cm, skin thickening (post radiation)  Recommendation: Left mastectomy Followed by adjuvant chemotherapy with CMF x 6 cycles  Return clinic after surgery to discuss final pathology report

## 2023-02-12 ENCOUNTER — Encounter: Payer: Self-pay | Admitting: *Deleted

## 2023-02-15 ENCOUNTER — Telehealth: Payer: Self-pay | Admitting: Hematology and Oncology

## 2023-02-15 NOTE — Telephone Encounter (Signed)
Patient is aware of upcoming appointment times/dates.  

## 2023-02-18 DIAGNOSIS — Z9189 Other specified personal risk factors, not elsewhere classified: Secondary | ICD-10-CM | POA: Diagnosis not present

## 2023-02-20 DIAGNOSIS — Z794 Long term (current) use of insulin: Secondary | ICD-10-CM | POA: Diagnosis not present

## 2023-02-20 DIAGNOSIS — E1140 Type 2 diabetes mellitus with diabetic neuropathy, unspecified: Secondary | ICD-10-CM | POA: Diagnosis not present

## 2023-02-20 DIAGNOSIS — E1165 Type 2 diabetes mellitus with hyperglycemia: Secondary | ICD-10-CM | POA: Diagnosis not present

## 2023-02-22 DIAGNOSIS — H524 Presbyopia: Secondary | ICD-10-CM | POA: Diagnosis not present

## 2023-02-22 DIAGNOSIS — E119 Type 2 diabetes mellitus without complications: Secondary | ICD-10-CM | POA: Diagnosis not present

## 2023-02-26 NOTE — Progress Notes (Signed)
Patient Care Team: [name], MD as PCP - General (Hematology and Oncology) [name], MD as Consulting Physician (Hematology and Oncology) Audrey Peak, MD as Attending Physician (Radiation Oncology) Audrey Bouillon, MD as Consulting Physician (General Surgery) Audrey Peters Larna Daughters, NP as Nurse Practitioner (Hematology and Oncology)  DIAGNOSIS:  Encounter Diagnosis  Name Primary?   Malignant neoplasm of upper-outer quadrant of left breast in female, estrogen receptor negative (HCC) Yes    SUMMARY OF ONCOLOGIC HISTORY: Oncology History  Malignant neoplasm of upper-outer quadrant of left breast in female, estrogen receptor negative (HCC)  04/05/2017 Initial Diagnosis   Left breast asymmetry by ultrasound measured 1.3 cm at 2:30 position 10 cm from nipple, no axillary lymph nodes; biopsy IDC grade 2, ER 0%, PR 0%, HER-2 negative ratio 1.37, Ki-67 40%, T1c N0 stage IB AJCC 8    04/30/2017 Surgery   Left lumpectomy: IDC grade 3, 1.7 cm, DCIS, lymphovascular invasion present, margins negative, 0/1 lymph node negative, ER 0%, PR 0%, HER-2 negative ratio 1.37, Ki-67 40%, T1c N0 stage IB   05/22/2017 Genetic Testing   Patient had genetic testing due to a personal history of triple negative breast cancer.  The Common Hereditary Cancer Panel was ordered. The Hereditary Gene Panel offered by [facility] includes sequencing and/or deletion duplication testing of the following 46 genes: APC, ATM, AXIN2, BARD1, BMPR1A, BRCA1, BRCA2, BRIP1, CDH1, CDKN2A (p14ARF), CDKN2A (p16INK4a), CHEK2, CTNNA1, DICER1, EPCAM (Deletion/duplication testing only), GREM1 (promoter region deletion/duplication testing only), KIT, MEN1, MLH1, MSH2, MSH3, MSH6, MUTYH, NBN, NF1, NHTL1, PALB2, PDGFRA, PMS2, POLD1, POLE, PTEN, RAD50, RAD51C, RAD51D, SDHB, SDHC, SDHD, SMAD4, SMARCA4. STK11, TP53, TSC1, TSC2, and VHL.  The following genes were evaluated for sequence changes only: SDHA and HOXB13 c.251G>A variant only.     Results: No pathogenic mutations identified.  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  The date of this test report is 05/22/2017.    05/24/2017 - 08/30/2017 Chemotherapy   Dose dense Adriamycin and Cytoxan 4 followed by Taxol weekly 5 (stopped early for neuropathy)    09/26/2017 - 10/22/2017 Radiation Therapy   Adjuvant radiation therapy     CHIEF COMPLIANT: F/U to discuss treatment plan    INTERVAL HISTORY: Audrey Peters is a 67 y.o. with above-mentioned history of breast cancer. She presents to the clinic for a follow-up. Pt has not had surgery yet. She states that she is trying to take 1 step at a time. Surgery is planned for 03/12/23. SHes working on controlling her sugars   ALLERGIES:  is allergic to canagliflozin, empagliflozin, other, sulfa antibiotics, and ciprofloxacin.  MEDICATIONS:  Current Outpatient Medications  Medication Sig Dispense Refill   acetaminophen (TYLENOL) 650 MG CR tablet Take 650 mg by mouth every 8 (eight) hours as needed for pain.     atorvastatin (LIPITOR) 10 MG tablet 1 tablet     clotrimazole (LOTRIMIN) 1 % cream Apply 1 application topically daily as needed (for irritated/itchy skin.).      clotrimazole-betamethasone (LOTRISONE) cream      clotrimazole-betamethasone (LOTRISONE) cream APPLY TO AFFECTED AREA TWICE A DAY.     Cobalamin Combinations (B12 FOLATE) 800-800 MCG CAPS See admin instructions.     Continuous Blood Gluc Sensor (FREESTYLE LIBRE 2 SENSOR) MISC .     doxycycline (VIBRAMYCIN) 100 MG capsule Take 100 mg by mouth every 12 (twelve) hours.     Dulaglutide (TRULICITY) 3 MG/0.5ML SOPN 3  mg     DULoxetine (CYMBALTA) 30 MG capsule Take 30 mg  by mouth daily.     glucose blood (ONETOUCH VERIO) test strip use to check blood sugar     glucose blood test strip by miscellaneous route.     Insulin Pen Needle (B-D ULTRAFINE III SHORT PEN) 31G X 8 MM MISC 3 (three) times daily.     insulin regular human CONCENTRATED (HUMULIN R  U-500 KWIKPEN) 500 UNIT/ML kwikpen 150 units before breakfast, 80 units before lunch, 80 units before evening meal     losartan (COZAAR) 100 MG tablet Take 100 mg by mouth daily.     metFORMIN (GLUCOPHAGE-XR) 500 MG 24 hr tablet 2 tablets with evening meal     neomycin-polymyxin b-dexamethasone (MAXITROL) 3.5-10000-0.1 SUSP 1 drop 4 (four) times daily.     ONETOUCH VERIO test strip 2 (two) times daily.     pregabalin (LYRICA) 150 MG capsule 1 capsule     tobramycin-dexamethasone (TOBRADEX) ophthalmic solution SMARTSIG:In Eye(s)     triamterene-hydrochlorothiazide (MAXZIDE-25) 37.5-25 MG tablet Take 1 tablet by mouth every morning.     TRULICITY 1.5 MG/0.5ML SOPN SMARTSIG:0.5 Milliliter(s) SUB-Q Once a Week     No current facility-administered medications for this visit.    PHYSICAL EXAMINATION: ECOG PERFORMANCE STATUS: 1 - Symptomatic but completely ambulatory  Vitals:   03/01/23 0829  BP: 138/82  Pulse: 84  Resp: 18  Temp: (!) 97.3 F (36.3 C)  SpO2: 98%   Filed Weights   03/01/23 0829  Weight: 225 lb (102.1 kg)      LABORATORY DATA:  I have reviewed the data as listed    Latest Ref Rng & Units 02/04/2023    8:37 AM 01/24/2023    5:32 PM 04/14/2020   12:00 PM  CMP  Glucose 70 - 99 mg/dL 782   956   BUN 8 - 23 mg/dL 20   18   Creatinine 2.13 - 1.00 mg/dL 0.86  5.78  4.69   Sodium 135 - 145 mmol/L 138   140   Potassium 3.5 - 5.1 mmol/L 3.7   3.9   Chloride 98 - 111 mmol/L 104   103   CO2 22 - 32 mmol/L 28   27   Calcium 8.9 - 10.3 mg/dL 9.1   9.3   Total Protein 6.5 - 8.1 g/dL 6.8     Total Bilirubin 0.3 - 1.2 mg/dL 0.6     Alkaline Phos 38 - 126 U/L 62     AST 15 - 41 U/L 34     ALT 0 - 44 U/L 24       Lab Results  Component Value Date   WBC 5.4 02/04/2023   HGB 12.2 02/04/2023   HCT 38.6 02/04/2023   MCV 88.1 02/04/2023   PLT 178 02/04/2023   NEUTROABS 2.7 02/04/2023    ASSESSMENT & PLAN:  Malignant neoplasm of upper-outer quadrant of left breast in  female, estrogen receptor negative (HCC) 04/30/2017: Left lumpectomy: IDC grade 3, 1.7 cm, DCIS, lymphovascular invasion present, margins negative, 0/1 lymph node negative, ER 0%, PR 0%, HER-2 negative ratio 1.37, Ki-67 40%, T1c N0 stage IB    Recommendation: 1. adjuvant chemotherapy with dose dense Adriamycin and Cytoxan 4 followed by Taxol weekly 6 discontinued for neuropathy 2. 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  This is especially worse at night.   Severe fatigue: exercizing. Obesity: Encouraged her to work on her diet to lose some weight. Patient's comorbidities for neuropathy also includes diabetes.  I stressed the  importance of controlling her weight and blood sugars.   Breast cancer recurrence: mammogram 12/10/2022: Asymmetry/distortion left breast, ultrasound: 2 adjacent irregular masses 2 o'clock position left breast 1.9 cm and 1.2 cm, no suspicious lymph nodes Left breast biopsy 2:00 posterior: Grade 2 IDC with DCIS Left breast biopsy 2:00 anterior: Grade 2 IDC with DCIS ER 0%, PR 0%, HER2 0, Ki-67 30% 01/31/2023: Bone scan: Negative, CT CAP 01/26/2023: Negative for metastatic disease but nodular contour of the liver suggestive of cirrhosis 01/26/2023: Ultrasound left submandibular neck: Normal lymph node 1.2 cm 01/24/2023: MRI breast: The recently biopsied area measured 6.1 cm, skin thickening (post radiation)   Recommendation: Left mastectomy (possible delayed reconstruction) Followed by adjuvant chemotherapy with CMF x 6 cycles  RTC after surgery to discuss path and treatment plan  No orders of the defined types were placed in this encounter.  The patient has a good understanding of the overall plan. she agrees with it. she will call with any problems that may develop before the  next visit here. Total time spent: 30 mins including face to face time and time spent for planning, charting and co-ordination of care   Tamsen Meek, MD 03/01/23    I Janan Ridge am acting as a Neurosurgeon for [employer]  I have reviewed the above documentation for accuracy and completeness, and I agree with the above.

## 2023-03-01 ENCOUNTER — Inpatient Hospital Stay: Payer: BC Managed Care – PPO | Attending: Hematology and Oncology | Admitting: Hematology and Oncology

## 2023-03-01 ENCOUNTER — Other Ambulatory Visit: Payer: Self-pay

## 2023-03-01 VITALS — BP 138/82 | HR 84 | Temp 97.3°F | Resp 18 | Ht 64.0 in | Wt 225.0 lb

## 2023-03-01 DIAGNOSIS — Z79899 Other long term (current) drug therapy: Secondary | ICD-10-CM | POA: Insufficient documentation

## 2023-03-01 DIAGNOSIS — C50412 Malignant neoplasm of upper-outer quadrant of left female breast: Secondary | ICD-10-CM | POA: Insufficient documentation

## 2023-03-01 DIAGNOSIS — T451X5A Adverse effect of antineoplastic and immunosuppressive drugs, initial encounter: Secondary | ICD-10-CM | POA: Insufficient documentation

## 2023-03-01 DIAGNOSIS — G620 Drug-induced polyneuropathy: Secondary | ICD-10-CM | POA: Diagnosis not present

## 2023-03-01 DIAGNOSIS — Z171 Estrogen receptor negative status [ER-]: Secondary | ICD-10-CM | POA: Insufficient documentation

## 2023-03-01 NOTE — Assessment & Plan Note (Signed)
04/30/2017: Left lumpectomy: IDC grade 3, 1.7 cm, DCIS, lymphovascular invasion present, margins negative, 0/1 lymph node negative, ER 0%, PR 0%, HER-2 negative ratio 1.37, Ki-67 40%, T1c N0 stage IB    Recommendation: 1. adjuvant chemotherapy with dose dense Adriamycin and Cytoxan 4 followed by Taxol weekly 6 discontinued for neuropathy 2. 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  This is especially worse at night.   Severe fatigue: exercizing. Obesity: Encouraged her to work on her diet to lose some weight. Patient's comorbidities for neuropathy also includes diabetes.  I stressed the importance of controlling her weight and blood sugars.   Breast cancer recurrence: mammogram 12/10/2022: Asymmetry/distortion left breast, ultrasound: 2 adjacent irregular masses 2 o'clock position left breast 1.9 cm and 1.2 cm, no suspicious lymph nodes Left breast biopsy 2:00 posterior: Grade 2 IDC with DCIS Left breast biopsy 2:00 anterior: Grade 2 IDC with DCIS ER 0%, PR 0%, HER2 0, Ki-67 30% 01/31/2023: Bone scan: Negative, CT CAP 01/26/2023: Negative for metastatic disease but nodular contour of the liver suggestive of cirrhosis 01/26/2023: Ultrasound left submandibular neck: Normal lymph node 1.2 cm 01/24/2023: MRI breast: The recently biopsied area measured 6.1 cm, skin thickening (post radiation)   Recommendation: Left mastectomy Followed by adjuvant chemotherapy with CMF x 6 cycles

## 2023-03-04 ENCOUNTER — Telehealth: Payer: Self-pay | Admitting: Hematology and Oncology

## 2023-03-04 NOTE — Telephone Encounter (Signed)
Scheduled appointment per 7/19 los. Patient is aware of the made appointment.

## 2023-03-05 ENCOUNTER — Encounter (HOSPITAL_BASED_OUTPATIENT_CLINIC_OR_DEPARTMENT_OTHER): Payer: Self-pay | Admitting: General Surgery

## 2023-03-06 ENCOUNTER — Encounter (HOSPITAL_BASED_OUTPATIENT_CLINIC_OR_DEPARTMENT_OTHER)
Admission: RE | Admit: 2023-03-06 | Discharge: 2023-03-06 | Disposition: A | Discharge status: Home / Self Care | Payer: Medicare Other | Source: Ambulatory Visit | Attending: General Surgery | Admitting: General Surgery

## 2023-03-06 DIAGNOSIS — Z01818 Encounter for other preprocedural examination: Secondary | ICD-10-CM | POA: Insufficient documentation

## 2023-03-06 LAB — BASIC METABOLIC PANEL
Anion gap: 9 (ref 5–15)
BUN: 23 mg/dL (ref 8–23)
CO2: 24 mmol/L (ref 22–32)
Calcium: 9.1 mg/dL (ref 8.9–10.3)
Chloride: 101 mmol/L (ref 98–111)
Creatinine, Ser: 1.03 mg/dL — ABNORMAL HIGH (ref 0.44–1.00)
GFR, Estimated: 60 mL/min — ABNORMAL LOW (ref >60)
Glucose, Bld: 289 mg/dL — ABNORMAL HIGH (ref 70–99)
Potassium: 4.3 mmol/L (ref 3.5–5.1)
Sodium: 134 mmol/L — ABNORMAL LOW (ref 135–145)

## 2023-03-06 MED ORDER — CHLORHEXIDINE GLUCONATE CLOTH 2 % EX PADS: 6.0000 | MEDICATED_PAD | Freq: Once | CUTANEOUS | Status: DC

## 2023-03-06 NOTE — Progress Notes (Signed)

## 2023-03-06 NOTE — Progress Notes (Signed)
Reviewed EKG with Dr. Renold Don, no concerns for scheduled surgery.

## 2023-03-07 DIAGNOSIS — C50412 Malignant neoplasm of upper-outer quadrant of left female breast: Secondary | ICD-10-CM | POA: Diagnosis not present

## 2023-03-07 DIAGNOSIS — Z9011 Acquired absence of right breast and nipple: Secondary | ICD-10-CM | POA: Diagnosis not present

## 2023-03-11 NOTE — Anesthesia Preprocedure Evaluation (Signed)
Anesthesia Evaluation  Patient identified by MRN, date of birth, ID band Patient awake    Reviewed: Allergy & Precautions, NPO status , Patient's Chart, lab work & pertinent test results  Airway Mallampati: III  TM Distance: >3 FB Neck ROM: Full    Dental  (+) Dental Advisory Given   Pulmonary sleep apnea    breath sounds clear to auscultation       Cardiovascular hypertension, Pt. on medications  Rhythm:Regular Rate:Normal     Neuro/Psych  Headaches  Neuromuscular disease    GI/Hepatic negative GI ROS, Neg liver ROS,,,  Endo/Other  diabetes, Type 2, Insulin Dependent  Morbid obesity  Renal/GU negative Renal ROS     Musculoskeletal  (+) Arthritis , Osteoarthritis,    Abdominal  (+) + obese  Peds  Hematology negative hematology ROS (+) Blood dyscrasia, anemia   Anesthesia Other Findings   Reproductive/Obstetrics                              Lab Results  Component Value Date   WBC 5.4 02/04/2023   HGB 12.2 02/04/2023   HCT 38.6 02/04/2023   MCV 88.1 02/04/2023   PLT 178 02/04/2023   Lab Results  Component Value Date   CREATININE 1.03 (H) 03/06/2023   BUN 23 03/06/2023   NA 134 (L) 03/06/2023   K 4.3 03/06/2023   CL 101 03/06/2023   CO2 24 03/06/2023    Anesthesia Physical Anesthesia Plan  ASA: 3  Anesthesia Plan: General and Regional   Post-op Pain Management: Regional block* and Minimal or no pain anticipated   Induction: Intravenous  PONV Risk Score and Plan: 3 and Ondansetron, Dexamethasone, Midazolam and Treatment may vary due to age or medical condition  Airway Management Planned: LMA and Oral ETT  Additional Equipment: None  Intra-op Plan:   Post-operative Plan: Extubation in OR  Informed Consent: I have reviewed the patients History and Physical, chart, labs and discussed the procedure including the risks, benefits and alternatives for the proposed  anesthesia with the patient or authorized representative who has indicated his/her understanding and acceptance.     Dental advisory given  Plan Discussed with: CRNA and Anesthesiologist  Anesthesia Plan Comments:          Anesthesia Quick Evaluation

## 2023-03-11 NOTE — Progress Notes (Signed)
Reviewed blood sugar (289) with Dr Freida Busman who said OK to proceed as scheduled with surgery

## 2023-03-12 ENCOUNTER — Other Ambulatory Visit: Payer: Self-pay

## 2023-03-12 ENCOUNTER — Ambulatory Visit (HOSPITAL_COMMUNITY): Payer: Medicare Other

## 2023-03-12 ENCOUNTER — Ambulatory Visit (HOSPITAL_BASED_OUTPATIENT_CLINIC_OR_DEPARTMENT_OTHER): Payer: Medicare Other | Admitting: Anesthesiology

## 2023-03-12 ENCOUNTER — Encounter (HOSPITAL_BASED_OUTPATIENT_CLINIC_OR_DEPARTMENT_OTHER)
Admission: RE | Disposition: A | Discharge status: Home / Self Care | Payer: Self-pay | Source: Home / Self Care | Attending: General Surgery

## 2023-03-12 ENCOUNTER — Ambulatory Visit (HOSPITAL_BASED_OUTPATIENT_CLINIC_OR_DEPARTMENT_OTHER)
Admission: RE | Admit: 2023-03-12 | Discharge: 2023-03-13 | Disposition: A | Discharge status: Home / Self Care | Payer: Medicare Other | Attending: General Surgery | Admitting: General Surgery

## 2023-03-12 ENCOUNTER — Encounter (HOSPITAL_BASED_OUTPATIENT_CLINIC_OR_DEPARTMENT_OTHER): Payer: Self-pay | Admitting: General Surgery

## 2023-03-12 DIAGNOSIS — Z171 Estrogen receptor negative status [ER-]: Secondary | ICD-10-CM | POA: Insufficient documentation

## 2023-03-12 DIAGNOSIS — E1142 Type 2 diabetes mellitus with diabetic polyneuropathy: Secondary | ICD-10-CM | POA: Diagnosis not present

## 2023-03-12 DIAGNOSIS — N6011 Diffuse cystic mastopathy of right breast: Secondary | ICD-10-CM | POA: Diagnosis not present

## 2023-03-12 DIAGNOSIS — Z959 Presence of cardiac and vascular implant and graft, unspecified: Secondary | ICD-10-CM | POA: Diagnosis not present

## 2023-03-12 DIAGNOSIS — R590 Localized enlarged lymph nodes: Secondary | ICD-10-CM | POA: Insufficient documentation

## 2023-03-12 DIAGNOSIS — G8918 Other acute postprocedural pain: Secondary | ICD-10-CM | POA: Diagnosis not present

## 2023-03-12 DIAGNOSIS — E1165 Type 2 diabetes mellitus with hyperglycemia: Secondary | ICD-10-CM

## 2023-03-12 DIAGNOSIS — Z794 Long term (current) use of insulin: Secondary | ICD-10-CM

## 2023-03-12 DIAGNOSIS — I10 Essential (primary) hypertension: Secondary | ICD-10-CM

## 2023-03-12 DIAGNOSIS — C50912 Malignant neoplasm of unspecified site of left female breast: Secondary | ICD-10-CM

## 2023-03-12 DIAGNOSIS — E119 Type 2 diabetes mellitus without complications: Secondary | ICD-10-CM

## 2023-03-12 DIAGNOSIS — Z9011 Acquired absence of right breast and nipple: Secondary | ICD-10-CM | POA: Diagnosis not present

## 2023-03-12 DIAGNOSIS — C50412 Malignant neoplasm of upper-outer quadrant of left female breast: Secondary | ICD-10-CM | POA: Insufficient documentation

## 2023-03-12 DIAGNOSIS — E782 Mixed hyperlipidemia: Secondary | ICD-10-CM | POA: Diagnosis not present

## 2023-03-12 DIAGNOSIS — G4733 Obstructive sleep apnea (adult) (pediatric): Secondary | ICD-10-CM | POA: Diagnosis not present

## 2023-03-12 HISTORY — PX: PORTACATH PLACEMENT: SHX2246

## 2023-03-12 HISTORY — PX: MASTECTOMY W/ SENTINEL NODE BIOPSY: SHX2001

## 2023-03-12 HISTORY — PX: SIMPLE MASTECTOMY WITH AXILLARY SENTINEL NODE BIOPSY: SHX6098

## 2023-03-12 LAB — GLUCOSE, CAPILLARY
Glucose-Capillary: 217 mg/dL — ABNORMAL HIGH (ref 70–99)
Glucose-Capillary: 232 mg/dL — ABNORMAL HIGH (ref 70–99)
Glucose-Capillary: 321 mg/dL — ABNORMAL HIGH (ref 70–99)
Glucose-Capillary: 316 mg/dL — ABNORMAL HIGH (ref 70–99)

## 2023-03-12 SURGERY — MASTECTOMY WITH SENTINEL LYMPH NODE BIOPSY
Anesthesia: Regional | Site: Chest | Laterality: Right

## 2023-03-12 MED ORDER — SUGAMMADEX SODIUM 200 MG/2ML IV SOLN
INTRAVENOUS | Status: DC | PRN
  Administered 2023-03-12: 200 mg via INTRAVENOUS

## 2023-03-12 MED ORDER — MIDAZOLAM HCL 2 MG/2ML IJ SOLN
INTRAMUSCULAR | Status: AC
  Filled 2023-03-12: qty 2

## 2023-03-12 MED ORDER — PHENYLEPHRINE HCL (PRESSORS) 10 MG/ML IV SOLN
INTRAVENOUS | Status: AC
  Filled 2023-03-12: qty 1

## 2023-03-12 MED ORDER — NEOMYCIN-POLYMYXIN-DEXAMETH 3.5-10000-0.1 OP SUSP
1.0000 [drp] | Freq: Four times a day (QID) | OPHTHALMIC | Status: DC
  Filled 2023-03-12: qty 5

## 2023-03-12 MED ORDER — SENNA 8.6 MG PO TABS
1.0000 | ORAL_TABLET | Freq: Two times a day (BID) | ORAL | Status: DC
  Administered 2023-03-13: 8.6 mg via ORAL
  Filled 2023-03-12: qty 1

## 2023-03-12 MED ORDER — LIDOCAINE HCL (CARDIAC) PF 100 MG/5ML IV SOSY
PREFILLED_SYRINGE | INTRAVENOUS | Status: DC | PRN
  Administered 2023-03-12: 100 mg via INTRAVENOUS

## 2023-03-12 MED ORDER — MELATONIN 3 MG PO TABS
3.0000 mg | ORAL_TABLET | Freq: Every evening | ORAL | Status: DC | PRN
  Administered 2023-03-12: 3 mg via ORAL
  Filled 2023-03-12: qty 1

## 2023-03-12 MED ORDER — GABAPENTIN 100 MG PO CAPS
200.0000 mg | ORAL_CAPSULE | Freq: Three times a day (TID) | ORAL | Status: DC
  Administered 2023-03-12 – 2023-03-13 (×2): 200 mg via ORAL

## 2023-03-12 MED ORDER — DEXAMETHASONE SODIUM PHOSPHATE 10 MG/ML IJ SOLN
INTRAMUSCULAR | Status: AC
  Filled 2023-03-12: qty 1

## 2023-03-12 MED ORDER — DIPHENHYDRAMINE HCL 12.5 MG/5ML PO ELIX: 12.5000 mg | ORAL_SOLUTION | Freq: Four times a day (QID) | ORAL | Status: DC | PRN

## 2023-03-12 MED ORDER — ATROPINE SULFATE 0.4 MG/ML IV SOLN
INTRAVENOUS | Status: AC
  Filled 2023-03-12: qty 1

## 2023-03-12 MED ORDER — METFORMIN HCL ER 500 MG PO TB24
500.0000 mg | ORAL_TABLET | Freq: Two times a day (BID) | ORAL | Status: DC
  Filled 2023-03-12: qty 1

## 2023-03-12 MED ORDER — PROPOFOL 10 MG/ML IV BOLUS
INTRAVENOUS | Status: DC | PRN
  Administered 2023-03-12: 150 mg via INTRAVENOUS

## 2023-03-12 MED ORDER — PHENYLEPHRINE HCL (PRESSORS) 10 MG/ML IV SOLN
INTRAVENOUS | Status: DC | PRN
  Administered 2023-03-12: 160 ug via INTRAVENOUS

## 2023-03-12 MED ORDER — FENTANYL CITRATE (PF) 100 MCG/2ML IJ SOLN
INTRAMUSCULAR | Status: AC
  Filled 2023-03-12: qty 2

## 2023-03-12 MED ORDER — PREGABALIN 75 MG PO CAPS
150.0000 mg | ORAL_CAPSULE | Freq: Two times a day (BID) | ORAL | Status: DC
  Administered 2023-03-12: 150 mg via ORAL
  Filled 2023-03-12: qty 2

## 2023-03-12 MED ORDER — EPHEDRINE 5 MG/ML INJ
INTRAVENOUS | Status: AC
  Filled 2023-03-12: qty 5

## 2023-03-12 MED ORDER — BUPIVACAINE HCL (PF) 0.25 % IJ SOLN
INTRAMUSCULAR | Status: DC | PRN
  Administered 2023-03-12: 30 mL via PERINEURAL

## 2023-03-12 MED ORDER — LACTATED RINGERS IV SOLN: INTRAVENOUS | Status: DC

## 2023-03-12 MED ORDER — HEPARIN (PORCINE) IN NACL 2-0.9 UNITS/ML
INTRAMUSCULAR | Status: AC | PRN
  Administered 2023-03-12: 1 via INTRAVENOUS

## 2023-03-12 MED ORDER — PHENYLEPHRINE 80 MCG/ML (10ML) SYRINGE FOR IV PUSH (FOR BLOOD PRESSURE SUPPORT)
PREFILLED_SYRINGE | INTRAVENOUS | Status: AC
  Filled 2023-03-12: qty 10

## 2023-03-12 MED ORDER — ROCURONIUM BROMIDE 100 MG/10ML IV SOLN
INTRAVENOUS | Status: DC | PRN
  Administered 2023-03-12: 50 mg via INTRAVENOUS

## 2023-03-12 MED ORDER — FENTANYL CITRATE (PF) 100 MCG/2ML IJ SOLN
INTRAMUSCULAR | Status: DC | PRN
  Administered 2023-03-12 (×2): 50 ug via INTRAVENOUS

## 2023-03-12 MED ORDER — ACETAMINOPHEN 500 MG PO TABS
ORAL_TABLET | ORAL | Status: AC
  Filled 2023-03-12: qty 2

## 2023-03-12 MED ORDER — TRIAMTERENE-HCTZ 37.5-25 MG PO TABS
1.0000 | ORAL_TABLET | Freq: Every morning | ORAL | Status: DC
  Filled 2023-03-12: qty 1

## 2023-03-12 MED ORDER — HEPARIN SOD (PORK) LOCK FLUSH 100 UNIT/ML IV SOLN
INTRAVENOUS | Status: AC
  Filled 2023-03-12: qty 5

## 2023-03-12 MED ORDER — LIDOCAINE-EPINEPHRINE (PF) 1 %-1:200000 IJ SOLN
INTRAMUSCULAR | Status: DC | PRN
  Administered 2023-03-12: 9 mL via INTRAMUSCULAR

## 2023-03-12 MED ORDER — LIDOCAINE 2% (20 MG/ML) 5 ML SYRINGE
INTRAMUSCULAR | Status: AC
  Filled 2023-03-12: qty 5

## 2023-03-12 MED ORDER — ONDANSETRON HCL 4 MG/2ML IJ SOLN
INTRAMUSCULAR | Status: AC
  Filled 2023-03-12: qty 2

## 2023-03-12 MED ORDER — LOSARTAN POTASSIUM 50 MG PO TABS
100.0000 mg | ORAL_TABLET | Freq: Every day | ORAL | Status: DC
  Filled 2023-03-12: qty 2

## 2023-03-12 MED ORDER — KETOROLAC TROMETHAMINE 30 MG/ML IJ SOLN
INTRAMUSCULAR | Status: DC | PRN
  Administered 2023-03-12: 30 mg via INTRAVENOUS

## 2023-03-12 MED ORDER — ONDANSETRON HCL 4 MG/2ML IJ SOLN
INTRAMUSCULAR | Status: DC | PRN
  Administered 2023-03-12: 4 mg via INTRAVENOUS

## 2023-03-12 MED ORDER — DEXAMETHASONE SODIUM PHOSPHATE 4 MG/ML IJ SOLN
INTRAMUSCULAR | Status: DC | PRN
  Administered 2023-03-12: 5 mg via INTRAVENOUS

## 2023-03-12 MED ORDER — ACETAMINOPHEN 500 MG PO TABS
1000.0000 mg | ORAL_TABLET | ORAL | Status: AC
  Administered 2023-03-12: 1000 mg via ORAL

## 2023-03-12 MED ORDER — 0.9 % SODIUM CHLORIDE (POUR BTL) OPTIME
TOPICAL | Status: DC | PRN
  Administered 2023-03-12: 1000 mL

## 2023-03-12 MED ORDER — GABAPENTIN 100 MG PO CAPS
ORAL_CAPSULE | ORAL | Status: AC
  Filled 2023-03-12: qty 2

## 2023-03-12 MED ORDER — MIDAZOLAM HCL 2 MG/2ML IJ SOLN
2.0000 mg | Freq: Once | INTRAMUSCULAR | Status: AC
  Administered 2023-03-12: 2 mg via INTRAVENOUS

## 2023-03-12 MED ORDER — INSULIN ASPART 100 UNIT/ML IJ SOLN
0.0000 [IU] | Freq: Three times a day (TID) | INTRAMUSCULAR | Status: DC
  Administered 2023-03-12: 11 [IU] via SUBCUTANEOUS
  Administered 2023-03-13: 8 [IU] via SUBCUTANEOUS
  Filled 2023-03-12 (×2): qty 1

## 2023-03-12 MED ORDER — ONDANSETRON HCL 4 MG/2ML IJ SOLN: 4.0000 mg | Freq: Four times a day (QID) | INTRAMUSCULAR | Status: DC | PRN

## 2023-03-12 MED ORDER — SUCCINYLCHOLINE CHLORIDE 200 MG/10ML IV SOSY
PREFILLED_SYRINGE | INTRAVENOUS | Status: AC
  Filled 2023-03-12: qty 10

## 2023-03-12 MED ORDER — MEPERIDINE HCL 25 MG/ML IJ SOLN: 6.2500 mg | INTRAMUSCULAR | Status: DC | PRN

## 2023-03-12 MED ORDER — LIDOCAINE-EPINEPHRINE (PF) 1 %-1:200000 IJ SOLN
INTRAMUSCULAR | Status: AC
  Filled 2023-03-12: qty 120

## 2023-03-12 MED ORDER — ACETAMINOPHEN ER 650 MG PO TBCR: 1300.0000 mg | EXTENDED_RELEASE_TABLET | Freq: Three times a day (TID) | ORAL | Status: DC

## 2023-03-12 MED ORDER — FENTANYL CITRATE (PF) 100 MCG/2ML IJ SOLN
50.0000 ug | Freq: Once | INTRAMUSCULAR | Status: AC
  Administered 2023-03-12: 50 ug via INTRAVENOUS

## 2023-03-12 MED ORDER — CEFAZOLIN SODIUM-DEXTROSE 2-4 GM/100ML-% IV SOLN
2.0000 g | Freq: Three times a day (TID) | INTRAVENOUS | Status: AC
  Administered 2023-03-12: 2 g via INTRAVENOUS
  Filled 2023-03-12: qty 100

## 2023-03-12 MED ORDER — FENTANYL CITRATE (PF) 100 MCG/2ML IJ SOLN: 25.0000 ug | INTRAMUSCULAR | Status: DC | PRN

## 2023-03-12 MED ORDER — OXYCODONE HCL 5 MG PO TABS
5.0000 mg | ORAL_TABLET | ORAL | Status: DC | PRN
  Administered 2023-03-12 – 2023-03-13 (×2): 5 mg via ORAL
  Filled 2023-03-12 (×2): qty 1

## 2023-03-12 MED ORDER — ONDANSETRON 4 MG PO TBDP: 4.0000 mg | ORAL_TABLET | Freq: Four times a day (QID) | ORAL | Status: DC | PRN

## 2023-03-12 MED ORDER — ACETAMINOPHEN 160 MG/5ML PO SOLN: 325.0000 mg | ORAL | Status: DC | PRN

## 2023-03-12 MED ORDER — CEFAZOLIN SODIUM-DEXTROSE 2-4 GM/100ML-% IV SOLN
2.0000 g | INTRAVENOUS | Status: AC
  Administered 2023-03-12: 2 g via INTRAVENOUS

## 2023-03-12 MED ORDER — MAGTRACE LYMPHATIC TRACER
INTRAMUSCULAR | Status: DC | PRN
  Administered 2023-03-12: 2 mL via INTRAMUSCULAR

## 2023-03-12 MED ORDER — PHENYLEPHRINE HCL-NACL 20-0.9 MG/250ML-% IV SOLN
INTRAVENOUS | Status: DC | PRN
  Administered 2023-03-12: 40 ug/min via INTRAVENOUS

## 2023-03-12 MED ORDER — BUPIVACAINE HCL (PF) 0.25 % IJ SOLN
INTRAMUSCULAR | Status: AC
  Filled 2023-03-12: qty 120

## 2023-03-12 MED ORDER — MIDAZOLAM HCL 5 MG/5ML IJ SOLN
INTRAMUSCULAR | Status: DC | PRN
  Administered 2023-03-12: 1 mg via INTRAVENOUS

## 2023-03-12 MED ORDER — KCL IN DEXTROSE-NACL 20-5-0.45 MEQ/L-%-% IV SOLN
INTRAVENOUS | Status: DC
  Filled 2023-03-12: qty 1000

## 2023-03-12 MED ORDER — DULOXETINE HCL 30 MG PO CPEP
30.0000 mg | ORAL_CAPSULE | Freq: Every day | ORAL | Status: DC
  Administered 2023-03-13: 30 mg via ORAL
  Filled 2023-03-12: qty 1

## 2023-03-12 MED ORDER — OXYCODONE HCL 5 MG PO TABS: 5.0000 mg | ORAL_TABLET | Freq: Once | ORAL | Status: DC | PRN

## 2023-03-12 MED ORDER — METHOCARBAMOL 500 MG PO TABS
500.0000 mg | ORAL_TABLET | Freq: Four times a day (QID) | ORAL | Status: DC | PRN
  Administered 2023-03-12 (×2): 500 mg via ORAL
  Filled 2023-03-12 (×2): qty 1

## 2023-03-12 MED ORDER — DIPHENHYDRAMINE HCL 50 MG/ML IJ SOLN: 12.5000 mg | Freq: Four times a day (QID) | INTRAMUSCULAR | Status: DC | PRN

## 2023-03-12 MED ORDER — ACETAMINOPHEN 500 MG PO TABS
1000.0000 mg | ORAL_TABLET | Freq: Three times a day (TID) | ORAL | Status: DC
  Administered 2023-03-12 – 2023-03-13 (×3): 1000 mg via ORAL
  Filled 2023-03-12 (×3): qty 2

## 2023-03-12 MED ORDER — EPHEDRINE SULFATE (PRESSORS) 50 MG/ML IJ SOLN
INTRAMUSCULAR | Status: DC | PRN
  Administered 2023-03-12: 10 mg via INTRAVENOUS

## 2023-03-12 MED ORDER — PROCHLORPERAZINE MALEATE 10 MG PO TABS: 10.0000 mg | ORAL_TABLET | Freq: Four times a day (QID) | ORAL | Status: DC | PRN

## 2023-03-12 MED ORDER — ONDANSETRON HCL 4 MG/2ML IJ SOLN: 4.0000 mg | Freq: Once | INTRAMUSCULAR | Status: DC | PRN

## 2023-03-12 MED ORDER — HEPARIN (PORCINE) IN NACL 1000-0.9 UT/500ML-% IV SOLN
INTRAVENOUS | Status: AC
  Filled 2023-03-12: qty 500

## 2023-03-12 MED ORDER — PROCHLORPERAZINE EDISYLATE 10 MG/2ML IJ SOLN: 5.0000 mg | Freq: Four times a day (QID) | INTRAMUSCULAR | Status: DC | PRN

## 2023-03-12 MED ORDER — SIMETHICONE 80 MG PO CHEW: 40.0000 mg | CHEWABLE_TABLET | Freq: Four times a day (QID) | ORAL | Status: DC | PRN

## 2023-03-12 MED ORDER — ROCURONIUM BROMIDE 10 MG/ML (PF) SYRINGE
PREFILLED_SYRINGE | INTRAVENOUS | Status: AC
  Filled 2023-03-12: qty 10

## 2023-03-12 MED ORDER — TRANEXAMIC ACID 1000 MG/10ML IV SOLN
INTRAVENOUS | Status: AC
  Filled 2023-03-12: qty 30

## 2023-03-12 MED ORDER — TRANEXAMIC ACID 1000 MG/10ML IV SOLN
Status: DC | PRN
  Administered 2023-03-12: 3000 mg via TOPICAL

## 2023-03-12 MED ORDER — TRAMADOL HCL 50 MG PO TABS
50.0000 mg | ORAL_TABLET | Freq: Four times a day (QID) | ORAL | Status: DC | PRN
  Administered 2023-03-12 (×2): 50 mg via ORAL
  Filled 2023-03-12 (×2): qty 1

## 2023-03-12 MED ORDER — CEFAZOLIN SODIUM-DEXTROSE 2-4 GM/100ML-% IV SOLN
INTRAVENOUS | Status: AC
  Filled 2023-03-12: qty 100

## 2023-03-12 MED ORDER — HEPARIN SOD (PORK) LOCK FLUSH 100 UNIT/ML IV SOLN
INTRAVENOUS | Status: DC | PRN
  Administered 2023-03-12: 500 [IU] via INTRAVENOUS

## 2023-03-12 MED ORDER — TOBRAMYCIN-DEXAMETHASONE 0.3-0.1 % OP SUSP
1.0000 [drp] | Freq: Four times a day (QID) | OPHTHALMIC | Status: DC
  Filled 2023-03-12: qty 2.5

## 2023-03-12 MED ORDER — CLOTRIMAZOLE 1 % EX CREA: TOPICAL_CREAM | Freq: Two times a day (BID) | CUTANEOUS | Status: DC | PRN

## 2023-03-12 MED ORDER — OXYCODONE HCL 5 MG/5ML PO SOLN: 5.0000 mg | Freq: Once | ORAL | Status: DC | PRN

## 2023-03-12 MED ORDER — ACETAMINOPHEN 325 MG PO TABS: 325.0000 mg | ORAL_TABLET | ORAL | Status: DC | PRN

## 2023-03-12 SURGICAL SUPPLY — 74 items
ADH SKN CLS APL DERMABOND .7 (GAUZE/BANDAGES/DRESSINGS) ×6
APL PRP STRL LF DISP 70% ISPRP (MISCELLANEOUS) ×6
BAG DECANTER FOR FLEXI CONT (MISCELLANEOUS) ×3 IMPLANT
BINDER BREAST XLRG (GAUZE/BANDAGES/DRESSINGS) IMPLANT
BINDER BREAST XXLRG (GAUZE/BANDAGES/DRESSINGS) IMPLANT
BIOPATCH RED 1 DISK 7.0 (GAUZE/BANDAGES/DRESSINGS) IMPLANT
BLADE HEX COATED 2.75 (ELECTRODE) ×3 IMPLANT
BLADE SURG 10 STRL SS (BLADE) ×3 IMPLANT
BLADE SURG 11 STRL SS (BLADE) ×3 IMPLANT
BLADE SURG 15 STRL LF DISP TIS (BLADE) ×3 IMPLANT
BLADE SURG 15 STRL SS (BLADE) ×3
CANISTER SUCT 1200ML W/VALVE (MISCELLANEOUS) ×3 IMPLANT
CHLORAPREP W/TINT 26 (MISCELLANEOUS) ×3 IMPLANT
CLIP TI LARGE 6 (CLIP) IMPLANT
CLIP TI MEDIUM 6 (CLIP) ×6 IMPLANT
COVER MAYO STAND STRL (DRAPES) ×3 IMPLANT
COVER PROBE 5X48 (MISCELLANEOUS) ×3
COVER PROBE CYLINDRICAL 5X96 (MISCELLANEOUS) ×3 IMPLANT
DERMABOND ADVANCED .7 DNX12 (GAUZE/BANDAGES/DRESSINGS) ×3 IMPLANT
DRAIN CHANNEL 19F RND (DRAIN) ×3 IMPLANT
DRAPE C-ARM 42X72 X-RAY (DRAPES) ×3 IMPLANT
DRAPE UTILITY XL STRL (DRAPES) ×3 IMPLANT
DRSG TEGADERM 4X4.75 (GAUZE/BANDAGES/DRESSINGS) IMPLANT
ELECT BLADE 4.0 EZ CLEAN MEGAD (MISCELLANEOUS)
ELECT REM PT RETURN 9FT ADLT (ELECTROSURGICAL) ×3
ELECTRODE BLDE 4.0 EZ CLN MEGD (MISCELLANEOUS) IMPLANT
ELECTRODE REM PT RTRN 9FT ADLT (ELECTROSURGICAL) ×3 IMPLANT
EVACUATOR SILICONE 100CC (DRAIN) ×3 IMPLANT
GAUZE PAD ABD 8X10 STRL (GAUZE/BANDAGES/DRESSINGS) ×6 IMPLANT
GAUZE SPONGE 4X4 12PLY STRL (GAUZE/BANDAGES/DRESSINGS) ×3 IMPLANT
GAUZE SPONGE 4X4 12PLY STRL LF (GAUZE/BANDAGES/DRESSINGS) IMPLANT
GLOVE BIO SURGEON STRL SZ 6 (GLOVE) ×3 IMPLANT
GLOVE BIOGEL PI IND STRL 6.5 (GLOVE) ×3 IMPLANT
GOWN STRL REUS W/ TWL LRG LVL3 (GOWN DISPOSABLE) ×3 IMPLANT
GOWN STRL REUS W/ TWL XL LVL3 (GOWN DISPOSABLE) ×3 IMPLANT
GOWN STRL REUS W/TWL LRG LVL3 (GOWN DISPOSABLE) ×6
GOWN STRL REUS W/TWL XL LVL3 (GOWN DISPOSABLE) ×6
IV CONNECTOR ONE LINK NDLESS (IV SETS) IMPLANT
KIT CVR 48X5XPRB PLUP LF (MISCELLANEOUS) ×3 IMPLANT
KIT PORT POWER 8FR ISP CVUE (Port) IMPLANT
LIGHT WAVEGUIDE WIDE FLAT (MISCELLANEOUS) IMPLANT
NDL HYPO 25X1 1.5 SAFETY (NEEDLE) ×3 IMPLANT
NDL SAFETY ECLIP 18X1.5 (MISCELLANEOUS) ×3 IMPLANT
NEEDLE HYPO 25X1 1.5 SAFETY (NEEDLE) ×6 IMPLANT
NS IRRIG 1000ML POUR BTL (IV SOLUTION) ×3 IMPLANT
PACK BASIN DAY SURGERY FS (CUSTOM PROCEDURE TRAY) ×3 IMPLANT
PACK UNIVERSAL I (CUSTOM PROCEDURE TRAY) ×3 IMPLANT
PENCIL SMOKE EVACUATOR (MISCELLANEOUS) ×3 IMPLANT
PIN SAFETY STERILE (MISCELLANEOUS) ×3 IMPLANT
SLEEVE SCD COMPRESS KNEE MED (STOCKING) ×3 IMPLANT
SPIKE FLUID TRANSFER (MISCELLANEOUS) IMPLANT
SPONGE T-LAP 18X18 ~~LOC~~+RFID (SPONGE) ×3 IMPLANT
STAPLER VISISTAT 35W (STAPLE) IMPLANT
STOCKINETTE IMPERVIOUS LG (DRAPES) IMPLANT
STRIP CLOSURE SKIN 1/2X4 (GAUZE/BANDAGES/DRESSINGS) ×3 IMPLANT
SUT ETHILON 2 0 FS 18 (SUTURE) IMPLANT
SUT MNCRL AB 4-0 PS2 18 (SUTURE) ×12 IMPLANT
SUT PROLENE 2 0 SH DA (SUTURE) ×6 IMPLANT
SUT SILK 0 TIES 10X30 (SUTURE) ×3 IMPLANT
SUT SILK 2 0 SH (SUTURE) IMPLANT
SUT VIC AB 3-0 SH 27 (SUTURE)
SUT VIC AB 3-0 SH 27X BRD (SUTURE) ×3 IMPLANT
SUT VICRYL 3-0 CR8 SH (SUTURE) ×6 IMPLANT
SUT VICRYL AB 2 0 TIE (SUTURE) ×3 IMPLANT
SUT VICRYL AB 2 0 TIES (SUTURE)
SYR 10ML LL (SYRINGE) ×3 IMPLANT
SYR 5ML LUER SLIP (SYRINGE) ×3 IMPLANT
SYR BULB IRRIG 60ML STRL (SYRINGE) ×3 IMPLANT
SYR CONTROL 10ML LL (SYRINGE) ×3 IMPLANT
TOWEL GREEN STERILE FF (TOWEL DISPOSABLE) ×3 IMPLANT
TRACER MAGTRACE VIAL (MISCELLANEOUS) IMPLANT
TUBE CONNECTING 20X1/4 (TUBING) ×3 IMPLANT
UNDERPAD 30X36 HEAVY ABSORB (UNDERPADS AND DIAPERS) ×3 IMPLANT
YANKAUER SUCT BULB TIP NO VENT (SUCTIONS) ×3 IMPLANT

## 2023-03-12 NOTE — Anesthesia Procedure Notes (Signed)
Anesthesia Regional Block: Pectoralis block   Pre-Anesthetic Checklist: , timeout performed,  Correct Patient, Correct Site, Correct Laterality,  Correct Procedure, Correct Position, site marked,  Risks and benefits discussed,  Surgical consent,  Pre-op evaluation,  At surgeon's request and post-op pain management  Laterality: Left  Prep: chloraprep       Needles:  Injection technique: Single-shot  Needle Type: Echogenic Stimulator Needle     Needle Length: 5cm  Needle Gauge: 22     Additional Needles:   Procedures:, nerve stimulator,,, ultrasound used (permanent image in chart),,    Narrative:  Start time: 03/12/2023 6:55 AM End time: 03/12/2023 7:00 AM Injection made incrementally with aspirations every 5 mL.  Performed by: Personally  Anesthesiologist: Bethena Midget, MD  Additional Notes: Functioning IV was confirmed and monitors were applied.  A 50mm 22ga Arrow echogenic stimulator needle was used. Sterile prep and drape,hand hygiene and sterile gloves were used. Ultrasound guidance: relevant anatomy identified, needle position confirmed, local anesthetic spread visualized around nerve(s)., vascular puncture avoided.  Image printed for medical record. Negative aspiration and negative test dose prior to incremental administration of local anesthetic. The patient tolerated the procedure well.

## 2023-03-12 NOTE — Transfer of Care (Signed)
Immediate Anesthesia Transfer of Care Note  Patient: JEREMIE SALTARELLI  Procedure(s) Performed: LEFT MASTECTOMY WITH SENTINEL LYMPH NODE BIOPSY (Left: Breast) RIGHT MASTECTOMY (Right: Breast) PORT PLACEMENT WITH ULTRASOUND GUIDANCE (Chest)  Patient Location: PACU  Anesthesia Type:GA combined with regional for post-op pain  Level of Consciousness: sedated  Airway & Oxygen Therapy: Patient Spontanous Breathing and Patient connected to face mask oxygen  Post-op Assessment: Report given to RN and Post -op Vital signs reviewed and stable  Post vital signs: Reviewed and stable  Last Vitals:  Vitals Value Taken Time  BP    Temp    Pulse    Resp    SpO2      Last Pain:  Vitals:   03/12/23 0638  TempSrc: Temporal  PainSc: 0-No pain         Complications: No notable events documented.

## 2023-03-12 NOTE — Op Note (Signed)
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   Post-operative Diagnosis: same  Surgeon: Almond Lint   Assistant:  Patrici Ranks, RNFA  Anesthesia: General endotracheal anesthesia and pectoral block  ASA Class: 3  Procedure Details  The patient was seen in the Holding Room. The risks, benefits, complications, treatment options, and expected outcomes were discussed with the patient. The possibilities of reaction to medication, pulmonary aspiration, bleeding, infection, the need for additional procedures, failure to diagnose a condition, and creating a complication requiring transfusion or operation were discussed with the patient. The patient concurred with the proposed plan, giving informed consent.  The site of surgery properly noted/marked. The patient was taken to Operating Room # 8, identified as Audrey Peters and the procedure verified as port, bilateral Mastectomy and left Sentinel Node Biopsy. A Time Out was held and the above information confirmed.  The magtrace was injected in the left subareolar location.   The port was addressed first.  She had prior port on the right, so the same site was used.  Local anesthetic was administered over this   area at the angle of the clavicle.  The vein was accessed with 1 pass(es) of the needle. There was good venous return and the wire passed easily with no ectopy.   Fluoroscopy was used to confirm that the wire was in the vena cava.      The patient was placed back level and the area for the pocket was anethetized   with local anesthetic.  A 3-cm transverse incision was made with a #15   blade.  Cautery was used to divide the subcutaneous  tissues down to the   pectoralis muscle.  An [employer] retractor was used to elevate the skin   while a pocket was created on top of the pectoralis fascia.  The port   was placed into the pocket to confirm that it was of adequate size.  The   catheter was preattached to the port.  The port was then secured to the   pectoralis fascia with four 2-0 Prolene sutures.  These were clamped and   not tied down yet.    The catheter was tunneled through to the wire exit   site.  The catheter was placed along the wire to determine what length it should be to be in the SVC.  The catheter was cut at 15.5 cm.  The tunneler sheath and dilator were passed over the wire and the dilator and wire were removed.  The catheter was advanced through the tunneler sheath and the tunneler sheath was pulled away.  Care was taken to keep the catheter in the tunneler sheath as this occurred. This was advanced and the tunneler sheath was removed.  There was good venous   return and easy flush of the catheter.  The Prolene sutures were tied   down to the pectoral fascia.  The skin was reapproximated using 3-0   Vicryl interrupted deep dermal sutures.    Fluoroscopy was used to re-confirm good position of the catheter.  The skin   was then closed using 4-0 Monocryl in a subcuticular fashion.  The port was flushed with concentrated heparin flush as well.     The right breast (benign breast) was addressed  first. The borders of the breast were identified and marked.  A wise pattern was used to make an anchor incision for this side.  The incision was drawn out to make sure incision lines were equidistant in length.    The superior incision was made with the #10 blade.  Mastectomy hooks were used to provide elevation of the skin edges, and the cautery was used to create the mastectomy flaps.  The dissection was taken to the fascia of the pectoralis major.  The penetrating vessels were clipped as needed.  The superior flap was taken  medially to the lateral sternal border, superiorly to the inferior border of the clavicle.  The inferior flap was similarly created, inferiorly to the inframammary fold and laterally to the border of the latissimus.  The breast was taken off including the pectoralis fascia and the axillary tail marked.    The left breast was then addressed. A traditional mastectomy incision was made as her prior UOQ incision had to be resected.  The flaps were taken similarly.   Using a sentimag probe, axillary sentinel nodes were attempted to be identified. The axilla mapped poorly which was not unexpected given prior XRT and SLN bx.   At least deep level 2 axillary sentinel nodes were removed and submitted to pathology.  #1 had slightly elevated signal, but the axilla had mostly fibrofatty replaced nodes.   The lymphovascular channels were clipped with metal clips.        The wound was irrigated.  Hemostasis was achieved with cautery. TXA soaked laps were placed into the wounds and left for several minutes in order to minimize risk of post op bleeding.  One 19 Blake drain was placed laterally on each side  and secured with a 2-0 nylon.  The wound was irrigated and closed with a 3-0 Vicryl deep dermal interrupted sutures and 4-0 Vicryl subcuticular closure in layers.    Sterile dressings were applied. At the end of the operation, all sponge, instrument, and needle counts were correct.  Findings: grossly clear surgical margins, nodes- several palpable nodes.  #1 with slightly elevated cps  Estimated Blood Loss: min          Drains: 19 Fr blake drain in right chest wall, 19 fr blake in left chest wall                Specimens: right breast, left breast and two left axillary sentinel nodes         Complications:  None; patient tolerated the procedure well.         Disposition: PACU - hemodynamically stable.         Condition: stable

## 2023-03-12 NOTE — Interval H&P Note (Signed)
History and Physical Interval Note:  03/12/2023 7:38 AM  Audrey Peters  has presented today for surgery, with the diagnosis of LEFT BREAST CANCER, HISTORY OF LEFT BREAST CANCER.  The various methods of treatment have been discussed with the patient and family. After consideration of risks, benefits and other options for treatment, the patient has consented to  Procedure(s): LEFT MASTECTOMY WITH SENTINEL LYMPH NODE BIOPSY (Left) RIGHT MASTECTOMY (Right) PORT PLACEMENT WITH ULTRASOUND GUIDANCE (N/A) as a surgical intervention.  The patient's history has been reviewed, patient examined, no change in status, stable for surgery.  I have reviewed the patient's chart and labs.  Questions were answered to the patient's satisfaction.     Almond Lint

## 2023-03-12 NOTE — Progress Notes (Signed)
Assisted Dr. Ambrose Pancoast with left, pectoralis, ultrasound guided block. Side rails up, monitors on throughout procedure. See vital signs in flow sheet. Tolerated Procedure well.

## 2023-03-12 NOTE — Anesthesia Procedure Notes (Signed)
Procedure Name: Intubation Date/Time: 03/12/2023 7:57 AM  Performed by: Ronnette Hila, CRNAPre-anesthesia Checklist: Patient identified, Emergency Drugs available, Suction available and Patient being monitored Patient Re-evaluated:Patient Re-evaluated prior to induction Oxygen Delivery Method: Circle system utilized Preoxygenation: Pre-oxygenation with 100% oxygen Induction Type: IV induction Ventilation: Mask ventilation without difficulty Laryngoscope Size: Glidescope, Mac and 3 Grade View: Grade II Tube type: Oral Number of attempts: 1 Airway Equipment and Method: Stylet, Oral airway and Video-laryngoscopy Placement Confirmation: ETT inserted through vocal cords under direct vision, positive ETCO2 and breath sounds checked- equal and bilateral Secured at: 22 cm Tube secured with: Tape Dental Injury: Teeth and Oropharynx as per pre-operative assessment

## 2023-03-12 NOTE — Discharge Instructions (Signed)
[facility] [place] [phone]  MASTECTOMY: POST OP INSTRUCTIONS  Always review your discharge instruction sheet given to you by the facility where your surgery was performed. IF YOU HAVE DISABILITY OR FAMILY LEAVE FORMS, YOU MUST BRING THEM TO THE OFFICE FOR PROCESSING.   DO NOT GIVE THEM TO YOUR DOCTOR. A prescription for pain medication may be given to you upon discharge.  Take your pain medication as prescribed, if needed.  If narcotic pain medicine is not needed, then you may take acetaminophen (Tylenol) or ibuprofen (Advil) as needed. Take your usually prescribed medications unless otherwise directed. If you need a refill on your pain medication, please contact your pharmacy.  They will contact our office to request authorization.  Prescriptions will not be filled after 5pm or on week-ends. You should follow a light diet the first few days after arrival home, such as soup and crackers, etc.  Resume your normal diet the day after surgery. Most patients will experience some swelling and bruising on the chest and underarm.  Ice packs will help.  Swelling and bruising can take several days to resolve.  It is common to experience some constipation if taking pain medication after surgery.  Increasing fluid intake and taking a stool softener (such as Colace) will usually help or prevent this problem from occurring.  A mild laxative (Milk of Magnesia or Miralax) should be taken according to package instructions if there are no bowel movements after 48 hours. Unless discharge instructions indicate otherwise, leave your bandage dry and in place until your next appointment in 3-5 days.  You may take a limited sponge bath.  No tube baths or showers until the drains are removed.  You may have steri-strips (small skin tapes) in place directly over the incision.  These strips should be left on the skin for 7-10 days.  If your surgeon used skin glue on the incision, you may shower in 24 hours.   The glue will flake off over the next 2-3 weeks.  Any sutures or staples will be removed at the office during your follow-up visit. DRAINS:  If you have drains in place, it is important to keep a list of the amount of drainage produced each day in your drains.  Before leaving the hospital, you should be instructed on drain care.  Call our office if you have any questions about your drains. ACTIVITIES:  You may resume regular (light) daily activities beginning the next day--such as daily self-care, walking, climbing stairs--gradually increasing activities as tolerated.  You may have sexual intercourse when it is comfortable.  Refrain from any heavy lifting or straining until approved by your doctor. You may drive when you are no longer taking prescription pain medication, you can comfortably wear a seatbelt, and you can safely maneuver your car and apply brakes. RETURN TO WORK:  __________________________________________________________ Bonita Quin should see your doctor in the office for a follow-up appointment approximately 3-5 days after your surgery.  Your doctor's nurse will typically make your follow-up appointment when she calls you with your pathology report.  Expect your pathology report 2-3 business days after your surgery.  You may call to check if you do not hear from [place] after three days.   OTHER INSTRUCTIONS: ______________________________________________________________________________________________ ____________________________________________________________________________________________ WHEN TO CALL YOUR DOCTOR: Fever over 101.0 Nausea and/or vomiting Extreme swelling or bruising Continued bleeding from incision. Increased pain, redness, or drainage from the incision. The clinic staff is available to answer your questions during regular business hours.  Please don't hesitate  to call and ask to speak to one of the nurses for clinical concerns.  If you have a medical emergency, go to the  nearest emergency room or call 911.  A surgeon from [facility] is always on call at the hospital. [address], [place], [place]  [address] ? [address] [address], [place], [place]   [address] [phone] ? [phone] ? FAX [phone] Web site: [email or web address]    Regional Anesthesia Blocks  1. Numbness or the inability to move the "blocked" extremity may last from 3-48 hours after placement. The length of time depends on the medication injected and your individual response to the medication. If the numbness is not going away after 48 hours, call your surgeon.  2. The extremity that is blocked will need to be protected until the numbness is gone and the  Strength has returned. Because you cannot feel it, you will need to take extra care to avoid injury. Because it may be weak, you may have difficulty moving it or using it. You may not know what position it is in without looking at it while the block is in effect.  3. For blocks in the legs and feet, returning to weight bearing and walking needs to be done carefully. You will need to wait until the numbness is entirely gone and the strength has returned. You should be able to move your leg and foot normally before you try and bear weight or walk. You will need someone to be with you when you first try to ensure you do not fall and possibly risk injury.  4. Bruising and tenderness at the needle site are common side effects and will resolve in a few days.  5. Persistent numbness or new problems with movement should be communicated to the surgeon or the [facility] [phone] [facility] [phone]).   Post Anesthesia Home Care Instructions  Activity: Get plenty of rest for the remainder of the day. A responsible individual must stay with you for 24 hours following the procedure.  For the next 24 hours, DO NOT: -Drive a car -Advertising copywriter -Drink alcoholic beverages -Take any  medication unless instructed by your physician -Make any legal decisions or sign important papers.  Meals: Start with liquid foods such as gelatin or soup. Progress to regular foods as tolerated. Avoid greasy, spicy, heavy foods. If nausea and/or vomiting occur, drink only clear liquids until the nausea and/or vomiting subsides. Call your physician if vomiting continues.  Special Instructions/Symptoms: Your throat may feel dry or sore from the anesthesia or the breathing tube placed in your throat during surgery. If this causes discomfort, gargle with warm salt water. The discomfort should disappear within 24 hours.  If you had a scopolamine patch placed behind your ear for the management of post- operative nausea and/or vomiting:  1. The medication in the patch is effective for 72 hours, after which it should be removed.  Wrap patch in a tissue and discard in the trash. Wash hands thoroughly with soap and water. 2. You may remove the patch earlier than 72 hours if you experience unpleasant side effects which may include dry mouth, dizziness or visual disturbances. 3. Avoid touching the patch. Wash your hands with soap and water after contact with the patch.    About my Fernandez-Pratt Bulb Drain  What is a Vadala-Pratt bulb? A Goeser-Pratt is a soft, round device used to collect drainage. It is connected to a long, thin  drainage catheter, which is held in place by one or two small stiches near your surgical incision site. When the bulb is squeezed, it forms a vacuum, forcing the drainage to empty into the bulb.  Emptying the Heaps-Pratt bulb- To empty the bulb: 1. Release the plug on the top of the bulb. 2. Pour the bulb's contents into a measuring container which your nurse will provide. 3. Record the time emptied and amount of drainage. Empty the drain(s) as often as your     doctor or nurse recommends.  Date                  Time                    Amount (Drain 1)                  Amount (Drain 2)  _____________________________________________________________________  _____________________________________________________________________  _____________________________________________________________________  _____________________________________________________________________  _____________________________________________________________________  _____________________________________________________________________  _____________________________________________________________________  _____________________________________________________________________  Squeezing the Kiesler-Pratt Bulb- To squeeze the bulb: 1. Make sure the plug at the top of the bulb is open. 2. Squeeze the bulb tightly in your fist. You will hear air squeezing from the bulb. 3. Replace the plug while the bulb is squeezed. 4. Use a safety pin to attach the bulb to your clothing. This will keep the catheter from     pulling at the bulb insertion site.  When to call your doctor- Call your doctor if: Drain site becomes red, swollen or hot. You have a fever greater than 101 degrees F. There is oozing at the drain site. Drain falls out (apply a guaze bandage over the drain hole and secure it with tape). Drainage increases daily not related to activity patterns. (You will usually have more drainage when you are active than when you are resting.) Drainage has a bad odor.  Last dose of tylenol given at 8:05am Last dose of gabapentin given at 9:15am Last dose oxycodone at 5:26am

## 2023-03-12 NOTE — H&P (Signed)
REFERRING PHYSICIAN: Gudena  PROVIDER: Matthias Hughs, MD  Care Team: Patient Care Team: Renford Dills, MD as PCP - General (Internal Medicine) Sabas Sous, MD (Hematology and Oncology) Matthias Hughs, MD as Consulting Provider (Surgical Oncology)  MRN: [id number] DOB: May 10, 1956   Subjective  Chief Complaint: New Consultation (Malignant neoplasm of upper-outer quadrant of left breast in female, estrogen receptor negative (HCC) )  History of Present Illness: Audrey Peters is a 67 y.o. female who is seen today as an office consultation at the request of Dr. Nehemiah Settle for evaluation of New Consultation (Malignant neoplasm of upper-outer quadrant of left breast in female, estrogen receptor negative (HCC) )  Pt presents with new/recurrent left breast cancer 12/2022. She had screening detected asymmetry and distortion. Two findings were present at 2 o'clock. One was 1.4 cm and one was 1.2 cm. Core needle biopsy was performed and this was positive for grade 2 invasive ductal carcinoma with DCIS. 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  Pt had left breast cancer 2018 that was treated with lumpectomy/SLN bx, chemo and XRT. This was triple negative with path pT1cN0 negative margins and 0/1 node. Surgery was done by Dr. Luisa Hart.  Family cancer history: negative  Work: retired, 3 grandchildren.  Diagnostic mammogram/us: 12/26/22 [facility] ACR Breast Density Category b: There are scattered areas of fibroglandular density.  FINDINGS: There is a persistent focal asymmetry with associated architectural distortion in the upper outer left breast at mid depth. This appears anterior to the site of patient's previous lumpectomy. Further evaluation with ultrasound was performed.  Targeted ultrasound is performed, showing 2 adjacent irregular, hypoechoic masses along of the 2 o'clock axis of the left breast 8 cm from the nipple. The masses measure 1.4 x 0.8 x 1.9 cm and 0.7 x 0.6 x 1.2 cm. There  is peripheral vascularity. This correlates with the mammographic finding. Evaluation of the left axilla demonstrates no suspicious lymphadenopathy.  IMPRESSION: 1. Two suspicious left breast masses at the 2 o'clock position 8 cm from the nipple. Recommend ultrasound-guided biopsy. 2. No suspicious left axillary lymphadenopathy.  RECOMMENDATION: Two area ultrasound-guided biopsy of the left breast.  I have discussed the findings and recommendations with the patient. If applicable, a reminder letter will be sent to the patient regarding the next appointment.  BI-RADS CATEGORY 4: Suspicious.  Pathology core needle biopsy: 01/01/2023 1. Breast, left, needle core biopsy, 2 o'clock, 8cmfn, posterior depth, coil clip INVASIVE DUCTAL CARCINOMA, SEE NOTE DUCTAL CARCINOMA IN SITU, CRIBRIFORM AND SOLID TYPES, HIGH NUCLEAR GRADE TUBULE FORMATION: SCORE 3 NUCLEAR PLEOMORPHISM: SCORE 3 MITOTIC COUNT: SCORE 1 TOTAL SCORE: 7 OVERALL GRADE: 2 LYMPHOVASCULAR INVASION: NOT IDENTIFIED CANCER LENGTH: 4.5 MM CALCIFICATIONS: NOT IDENTIFIED OTHER FINDINGS: NONE  2. Breast, left, needle core biopsy, 2 o'clock, 8cmfn, middle depth, venus clip INVASIVE DUCTAL CARCINOMA, SEE NOTE DUCTAL CARCINOMA IN SITU, CRIBRIFORM AND SOLID TYPES, HIGH NUCLEAR GRADE TUBULE FORMATION: SCORE 3 NUCLEAR PLEOMORPHISM: SCORE 3 MITOTIC COUNT: SCORE 1 TOTAL SCORE: 7 OVERALL GRADE: 2 LYMPHOVASCULAR INVASION: NOT IDENTIFIED CANCER LENGTH: 8 MM CALCIFICATIONS: NOT IDENTIFIED OTHER FINDINGS: NONE SEE NOTE  Receptors: The tumor cells are negative for Her2 (0). Estrogen Receptor: 0%, NEGATIVE Progesterone Receptor: 0%, NEGATIVE Proliferation Marker Ki67: 30%  Review of Systems: A complete review of systems was obtained from the patient. I have reviewed this information and discussed as appropriate with the patient. See HPI as well for other ROS. ROS: Leg swelling, weakness, constipation.  Medical History: Past  Medical History: Diagnosis Date Arthritis Diabetes  mellitus without complication (CMS/HHS-HCC) History of cancer Hypertension  Patient Active Problem List Diagnosis Malignant neoplasm of upper-outer quadrant of left breast in female, estrogen receptor negative (CMS/HHS-HCC) History of left breast cancer Type 2 diabetes mellitus, with long-term current use of insulin (CMS/HHS-HCC)  Past Surgical History: Procedure Laterality Date BREAST SURGERY 2018   Allergies Allergen Reactions Ciprofloxacin Anaphylaxis and Palpitations Canagliflozin Other (See Comments) Caused a yeast infection Other reaction(s): yeast infections Empagliflozin Other (See Comments) Other reaction(s): yeast infections Other Other (See Comments) Other reaction(s): palpitation Other reaction(s): Unknown Sulfa (Sulfonamide Antibiotics) Itching  Current Outpatient Medications on File Prior to Visit Medication Sig Dispense Refill atorvastatin (LIPITOR) 10 MG tablet Take 10 mg by mouth once daily dulaglutide (TRULICITY) 1.5 mg/0.5 mL subcutaneous pen injector SMARTSIG:0.5 Milliliter(s) SUB-Q Once a Week HUMULIN R U-500, CONC, KWIKPEN 500 unit/mL (3 mL) pen injector 150 units before breakfast, 80 units before lunch, 80 units before evening meal losartan (COZAAR) 100 MG tablet Take 100 mg by mouth once daily metFORMIN (GLUCOPHAGE-XR) 500 MG XR tablet 2 tablets with evening meal pregabalin (LYRICA) 100 MG capsule Take 100 mg by mouth 2 (two) times daily triamterene (DYRENIUM) 100 MG capsule Take 100 mg by mouth 2 (two) times daily  No current facility-administered medications on file prior to visit.  History reviewed. No pertinent family history.  Social History  Tobacco Use Smoking Status Never Smokeless Tobacco Never   Social History  Socioeconomic History Marital status: Married Tobacco Use Smoking status: Never Smokeless tobacco: Never Substance and Sexual Activity Alcohol use: Not  Currently Drug use: Never  Objective:  Vitals: BP: 138/85 Pulse: 86 Temp: 36.6 C (97.9 F) SpO2: 96% Weight: (!) 103.7 kg (228 lb 9.6 oz) Height: 167.6 cm (5\' 6" ) PainSc: 5 PainLoc: Breast  Body mass index is 36.9 kg/m.  Gen: No acute distress. Well nourished and well groomed. Neurological: Alert and oriented to person, place, and time. Coordination normal. Head: Normocephalic and atraumatic. Eyes: Conjunctivae are normal. Pupils are equal, round, and reactive to light. No scleral icterus. Neck: Normal range of motion. Neck supple. No tracheal deviation or thyromegaly present. Palpable node vs submandibular gland on left. This is tender. Cardiovascular: Normal rate, regular rhythm, normal heart sounds and intact distal pulses. Exam reveals no gallop and no friction rub. No murmur heard. Breast: left breast with some radiation changes on skin and breast sl firmer. Smaller than right. No palpable masses or LAD either side. No nipple retraction or skin dimpling. Respiratory: Effort normal. No respiratory distress. No chest wall tenderness. Breath sounds normal. No wheezes, rales or rhonchi. GI: Soft. Bowel sounds are normal. The abdomen is soft and nontender. There is no rebound and no guarding. Musculoskeletal: Normal range of motion. Extremities are nontender. Lymphadenopathy: No cervical, preauricular, postauricular or axillary adenopathy is present Skin: Skin is warm and dry. No rash noted. No diaphoresis. No erythema. No pallor. No clubbing, cyanosis, or edema. Psychiatric: Normal mood and affect. Behavior is normal. Judgment and thought content normal.  Labs None new.  Assessment and Plan:  ICD-10-CM 1. Malignant neoplasm of upper-outer quadrant of left breast in female, estrogen receptor negative (CMS/HHS-HCC) C50.412 Ambulatory referral to Plastic Surgery Z17.1 MRI breast bilateral with and without contrast Ambulatory Referral to Plastic Surgery  2. History of left  breast cancer Z85.3  3. Head and neck lymphadenopathy R59.1 US soft tissue head and neck  4. Type 2 diabetes mellitus without complication, with long-term current use of insulin (CMS/HHS-HCC) E11.9 Z79.4   Pt has a  new diagnosis of c mT1cN0 left breast cancer near the site of the previous cancer.  Given that she has had prior radiation, she will need mastectomy. I discussed potential for reconstruction. Patient is interested in immediate reconstruction. I will get several opinions for this. I suspect that delayed reconstruction might be recommended on the left given her previous radiation to that side. However, if she is a candidate for immediate reconstruction that would be ideal. She is considering contralateral mastectomy versus reduction given the significant discrepancy in her breast size.  This would be followed by chemo again. Dr. Pamelia Hoit is planning CMF. I will plan to place a Port-A-Cath. No additional radiation would follow.  I discussed mastectomy and that the incision type depends on several things. I discussed that we would be removing the nipple regardless of whether we do reconstruction or not. I reviewed that I would likely would need an anchor shaped incision to make her as flat as possible if we were not doing reconstruction and that if we are the incision will be determined by the plastic surgeon.  I discussed that we would be taking the majority of the breast tissue but that there is always some breast tissue left behind underneath the skin in order to prevent skin necrosis. I reviewed that this does not make her risk of recurrence 0 as many people expect. There is still a risk of recurrent breast cancer even with bilateral mastectomy.  I reviewed the boundaries of the breast and expectations. I discussed risk of bleeding and wound breakdown. I discussed risk of loss of reconstruction. I reviewed risk of infection, heart or lung complications, blood clot. Guess the diabetes does  increase her risk for some of these complications. I reviewed that there will be a drain on each side should she elect to have bilateral mastectomies. I will plan to do a sentinel node again on the left. If she does not map, I would not plan to do an axillary lymph node dissection given that she can no longer get radiation and that she is already receiving chemotherapy.  Regarding the left upper cervical lymphadenopathy, I do think that this is most likely reactive. This is come up over 2 days and is tender. She did see dentistry and was not felt to have a tooth infection. Should this not appear to be reactive, we would schedule biopsy of this node.

## 2023-03-13 ENCOUNTER — Encounter (HOSPITAL_BASED_OUTPATIENT_CLINIC_OR_DEPARTMENT_OTHER): Payer: Self-pay | Admitting: General Surgery

## 2023-03-13 DIAGNOSIS — E1142 Type 2 diabetes mellitus with diabetic polyneuropathy: Secondary | ICD-10-CM | POA: Diagnosis not present

## 2023-03-13 DIAGNOSIS — Z171 Estrogen receptor negative status [ER-]: Secondary | ICD-10-CM | POA: Diagnosis not present

## 2023-03-13 DIAGNOSIS — C50412 Malignant neoplasm of upper-outer quadrant of left female breast: Secondary | ICD-10-CM | POA: Diagnosis not present

## 2023-03-13 DIAGNOSIS — R590 Localized enlarged lymph nodes: Secondary | ICD-10-CM | POA: Diagnosis not present

## 2023-03-13 DIAGNOSIS — C50912 Malignant neoplasm of unspecified site of left female breast: Secondary | ICD-10-CM | POA: Diagnosis not present

## 2023-03-13 DIAGNOSIS — Z794 Long term (current) use of insulin: Secondary | ICD-10-CM | POA: Diagnosis not present

## 2023-03-13 LAB — GLUCOSE, CAPILLARY: Glucose-Capillary: 299 mg/dL — ABNORMAL HIGH (ref 70–99)

## 2023-03-13 MED ORDER — METHOCARBAMOL 500 MG PO TABS: 500.0000 mg | ORAL_TABLET | Freq: Four times a day (QID) | ORAL | 3 refills | Status: DC | PRN

## 2023-03-13 MED ORDER — OXYCODONE HCL 5 MG PO TABS: 5.0000 mg | ORAL_TABLET | ORAL | 0 refills | Status: DC | PRN

## 2023-03-13 MED ORDER — ONDANSETRON 4 MG PO TBDP: 4.0000 mg | ORAL_TABLET | Freq: Four times a day (QID) | ORAL | 0 refills | Status: DC | PRN

## 2023-03-13 MED ORDER — SENNA 8.6 MG PO TABS: 1.0000 | ORAL_TABLET | Freq: Two times a day (BID) | ORAL | 0 refills | Status: AC

## 2023-03-13 NOTE — Discharge Summary (Signed)
Physician Discharge Summary  Patient ID: Audrey Peters MRN: [id number] DOB/AGE: 66/66/1957 66 y.o.  Admit date: 03/12/2023 Discharge date: 03/15/2023  Admission Diagnoses: Patient Active Problem List   Diagnosis Date Noted   Recurrent breast cancer, left (HCC) 03/12/2023   Atrophy of vagina 09/12/2020   Bilateral lower extremity edema 09/12/2020   History of ductal carcinoma in situ of breast 09/12/2020   Hyperglycemia due to type 2 diabetes mellitus (HCC) 09/12/2020   Knee pain 09/12/2020   Long term (current) use of insulin (HCC) 09/12/2020   Lumbosacral spondylosis without myelopathy 09/12/2020   Mixed hyperlipidemia 09/12/2020   Obstructive sleep apnea syndrome 09/12/2020   Ovarian cyst 09/12/2020   Overweight 09/12/2020   Personal history of malignant neoplasm of breast 09/12/2020   Postmenopausal bleeding 09/12/2020   Pure hypercholesterolemia 09/12/2020   Sciatica 09/12/2020   Morbid obesity (HCC) 09/12/2020   Chemotherapy-induced peripheral neuropathy (HCC) 01/22/2018   Encounter for antineoplastic chemotherapy 06/07/2017   Port-A-Cath in place 05/24/2017   Genetic testing 05/23/2017   Malignant neoplasm of upper-outer quadrant of left breast in female, estrogen receptor negative (HCC) 04/22/2017   Essential hypertension 01/30/2015   Diabetes mellitus (HCC) 01/30/2015    Discharge Diagnoses:  Principal Problem:   Recurrent breast cancer, left (HCC)  And above Discharged Condition: stable  Hospital Course:  Patient was admitted to the floor following right subclavian port placement, right mastectomy, left mastectomy, left axillary sentinel lymph node biopsy on 03/12/2023.  The patient had good pain control overnight.  Postoperative chest x-ray showed the port in good position.  The patient and her family were able to manage the drains.  She did not have high output from her drains in the fluid was serosanguineous in nature.  On physical examination there was no  evidence of flap hematoma.  She was stable with ambulation.  She was discharged to home in stable condition on POD 1.   Consults: None  Significant Diagnostic Studies: none  Treatments: surgery: see above  Discharge Exam: Blood pressure 109/66, pulse 72, temperature 97.7 F (36.5 C), resp. rate 18, height 5\' 4"  (1.626 m), weight 103.5 kg, SpO2 95%. General appearance: alert, cooperative, and no distress Resp: breathing comfortably Chest wall: anticipated tenderness bilaterally. No hematoma appreciated.  Drains serosang.   Disposition: Discharge disposition: 01-Home or Self Care       Discharge Instructions     Call MD for:  difficulty breathing, headache or visual disturbances   Complete by: As directed    Call MD for:  hives   Complete by: As directed    Call MD for:  persistant nausea and vomiting   Complete by: As directed    Call MD for:  redness, tenderness, or signs of infection (pain, swelling, redness, odor or green/yellow discharge around incision site)   Complete by: As directed    Call MD for:  severe uncontrolled pain   Complete by: As directed    Call MD for:  temperature >100.4   Complete by: As directed    Change dressing (specify)   Complete by: As directed    Dry gauze change as needed if desired.  Measure and record drain output twice daily and as needed.  Bring drain record to clinic.   Diet - low sodium heart healthy   Complete by: As directed    Increase activity slowly   Complete by: As directed       Allergies as of 03/13/2023       Reactions  Canagliflozin Other (See Comments)   Caused a yeast infection Other reaction(s): yeast infections   Empagliflozin    Other reaction(s): yeast infections   Other    Other reaction(s): palpitation Other reaction(s): Unknown   Sulfa Antibiotics Itching, Other (See Comments)   Unsure of exact reaction type   Ciprofloxacin Palpitations        Medication List     TAKE these medications     acetaminophen 650 MG CR tablet Commonly known as: TYLENOL Take 650 mg by mouth every 8 (eight) hours as needed for pain.   atorvastatin 10 MG tablet Commonly known as: LIPITOR 1 tablet   B-D ULTRAFINE III SHORT PEN 31G X 8 MM Misc Generic drug: Insulin Pen Needle 3 (three) times daily.   B12 Folate 800-800 MCG Caps See admin instructions.   clotrimazole 1 % cream Commonly known as: LOTRIMIN Apply 1 application topically daily as needed (for irritated/itchy skin.).   clotrimazole-betamethasone cream Commonly known as: LOTRISONE APPLY TO AFFECTED AREA TWICE A DAY.   clotrimazole-betamethasone cream Commonly known as: LOTRISONE   DULoxetine 30 MG capsule Commonly known as: CYMBALTA Take 30 mg by mouth daily.   FreeStyle Libre 2 Sensor Misc .   gabapentin 100 MG capsule Commonly known as: NEURONTIN Take 100 mg by mouth 3 (three) times daily.   HumuLIN R U-500 KwikPen 500 UNIT/ML KwikPen Generic drug: insulin regular human CONCENTRATED 150 units before breakfast, 80 units before lunch, 80 units before evening meal   losartan 100 MG tablet Commonly known as: COZAAR Take 100 mg by mouth daily.   metFORMIN 500 MG 24 hr tablet Commonly known as: GLUCOPHAGE-XR 2 tablets with evening meal   methocarbamol 500 MG tablet Commonly known as: ROBAXIN Take 1 tablet (500 mg total) by mouth every 6 (six) hours as needed for muscle spasms.   neomycin-polymyxin b-dexamethasone 3.5-10000-0.1 Susp Commonly known as: MAXITROL 1 drop 4 (four) times daily.   ondansetron 4 MG disintegrating tablet Commonly known as: ZOFRAN-ODT Take 1 tablet (4 mg total) by mouth every 6 (six) hours as needed for nausea.   OneTouch Verio test strip Generic drug: glucose blood use to check blood sugar   OneTouch Verio test strip Generic drug: glucose blood 2 (two) times daily.   glucose blood test strip by miscellaneous route.   oxyCODONE 5 MG immediate release tablet Commonly known as:  Oxy IR/ROXICODONE Take 1 tablet (5 mg total) by mouth every 4 (four) hours as needed for moderate pain.   pregabalin 150 MG capsule Commonly known as: LYRICA 1 capsule   Semaglutide(0.25 or 0.5MG /DOS) 2 MG/1.5ML Sopn Inject 0.25 mg into the skin once a week.   senna 8.6 MG Tabs tablet Commonly known as: SENOKOT Take 1 tablet (8.6 mg total) by mouth 2 (two) times daily.   tobramycin-dexamethasone ophthalmic solution Commonly known as: TOBRADEX SMARTSIG:In Eye(s)   triamterene-hydrochlorothiazide 37.5-25 MG tablet Commonly known as: MAXZIDE-25 Take 1 tablet by mouth every morning.   Trulicity 3 MG/0.5ML Sopn Generic drug: Dulaglutide 3  mg   Trulicity 1.5 MG/0.5ML Sopn Generic drug: Dulaglutide SMARTSIG:0.5 Milliliter(s) SUB-Q Once a Week               Discharge Care Instructions  (From admission, onward)           Start     Ordered   03/13/23 0000  Change dressing (specify)       Comments: Dry gauze change as needed if desired.  Measure and record drain output twice daily  and as needed.  Bring drain record to clinic.   03/13/23 1046            Follow-up Information     Almond Lint, MD Follow up in 2 week(s).   Specialty: General Surgery Contact information: [address] [place] [address] [place] [address] [phone]                 Signed: Almond Lint 03/15/2023, 2:24 PM

## 2023-03-13 NOTE — Anesthesia Postprocedure Evaluation (Signed)
Anesthesia Post Note  Patient: Audrey Peters  Procedure(s) Performed: LEFT MASTECTOMY WITH SENTINEL LYMPH NODE BIOPSY (Left: Breast) RIGHT MASTECTOMY (Right: Breast) PORT PLACEMENT WITH ULTRASOUND GUIDANCE (Chest)     Patient location during evaluation: PACU Anesthesia Type: Regional and General Level of consciousness: awake and alert Pain management: pain level controlled Vital Signs Assessment: post-procedure vital signs reviewed and stable Respiratory status: spontaneous breathing, nonlabored ventilation, respiratory function stable and patient connected to nasal cannula oxygen Cardiovascular status: blood pressure returned to baseline and stable Postop Assessment: no apparent nausea or vomiting Anesthetic complications: no   No notable events documented.  Last Vitals:  Vitals:   03/13/23 0515 03/13/23 0600  BP: 130/78   Pulse: 75 70  Resp: 16   Temp: 36.4 C   SpO2: 98% 96%    Last Pain:  Vitals:   03/13/23 0600  TempSrc:   PainSc: 5                  Jacque Byron

## 2023-03-14 ENCOUNTER — Inpatient Hospital Stay: Payer: BC Managed Care – PPO | Attending: Hematology and Oncology | Admitting: Licensed Clinical Social Worker

## 2023-03-14 DIAGNOSIS — G620 Drug-induced polyneuropathy: Secondary | ICD-10-CM | POA: Insufficient documentation

## 2023-03-14 DIAGNOSIS — C50412 Malignant neoplasm of upper-outer quadrant of left female breast: Secondary | ICD-10-CM | POA: Insufficient documentation

## 2023-03-14 DIAGNOSIS — Z171 Estrogen receptor negative status [ER-]: Secondary | ICD-10-CM | POA: Insufficient documentation

## 2023-03-14 NOTE — Progress Notes (Signed)
[facility] Clinical Social Work  Clinical Social Work was referred by nurse navigator/surgeon for in-home aide/ADL needs.  Clinical Social Worker contacted patient by phone to offer support and assess for needs.   Patient clarified that she is not looking for assistance with ADL's. She is more concerned with ensuring that she is doing wound/drain care correctly post-mastectomies. She currently has people helping but they will be less available next week as they return to work. She would like an RN to come in to check she is doing care correctly.  This CSW is not sure if home health can be ordered/covered for this need. CSW sent message to medical team to determine if they can place an order for [facility] RN or if patient can receive additional education and /or observation in office per pt's request.  Message sent to surgeon & nurse navigators with above information and request.     Merlyn Albert, LCSW  Clinical Social Worker [facility]

## 2023-03-18 ENCOUNTER — Encounter: Payer: Self-pay | Admitting: *Deleted

## 2023-03-19 NOTE — Progress Notes (Signed)
Patient Care Team: [name], MD as PCP - General (Hematology and Oncology) [name], MD as Consulting Physician (Hematology and Oncology) Lonie Peak, MD as Attending Physician (Radiation Oncology) Harriette Bouillon, MD as Consulting Physician (General Surgery) Axel Filler Larna Daughters, NP as Nurse Practitioner (Hematology and Oncology)  DIAGNOSIS:  Encounter Diagnosis  Name Primary?   Malignant neoplasm of upper-outer quadrant of left breast in female, estrogen receptor negative (HCC) Yes    SUMMARY OF ONCOLOGIC HISTORY: Oncology History  Malignant neoplasm of upper-outer quadrant of left breast in female, estrogen receptor negative (HCC)  04/05/2017 Initial Diagnosis   Left breast asymmetry by ultrasound measured 1.3 cm at 2:30 position 10 cm from nipple, no axillary lymph nodes; biopsy IDC grade 2, ER 0%, PR 0%, HER-2 negative ratio 1.37, Ki-67 40%, T1c N0 stage IB AJCC 8    04/30/2017 Surgery   Left lumpectomy: IDC grade 3, 1.7 cm, DCIS, lymphovascular invasion present, margins negative, 0/1 lymph node negative, ER 0%, PR 0%, HER-2 negative ratio 1.37, Ki-67 40%, T1c N0 stage IB   05/22/2017 Genetic Testing   Patient had genetic testing due to a personal history of triple negative breast cancer.  The Common Hereditary Cancer Panel was ordered. The Hereditary Gene Panel offered by [facility] includes sequencing and/or deletion duplication testing of the following 46 genes: APC, ATM, AXIN2, BARD1, BMPR1A, BRCA1, BRCA2, BRIP1, CDH1, CDKN2A (p14ARF), CDKN2A (p16INK4a), CHEK2, CTNNA1, DICER1, EPCAM (Deletion/duplication testing only), GREM1 (promoter region deletion/duplication testing only), KIT, MEN1, MLH1, MSH2, MSH3, MSH6, MUTYH, NBN, NF1, NHTL1, PALB2, PDGFRA, PMS2, POLD1, POLE, PTEN, RAD50, RAD51C, RAD51D, SDHB, SDHC, SDHD, SMAD4, SMARCA4. STK11, TP53, TSC1, TSC2, and VHL.  The following genes were evaluated for sequence changes only: SDHA and HOXB13 c.251G>A variant only.     Results: No pathogenic mutations identified.  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  The date of this test report is 05/22/2017.    05/24/2017 - 08/30/2017 Chemotherapy   Dose dense Adriamycin and Cytoxan 4 followed by Taxol weekly 5 (stopped early for neuropathy)    09/26/2017 - 10/22/2017 Radiation Therapy   Adjuvant radiation therapy   12/10/2022 Relapse/Recurrence   Mammogram detected distortion, 2 irregular masses 1.9 cm and 1.2 cm: Biopsy grade 2 IDC with DCIS triple negative, scans negative, MRI breast: 6.1 cm   03/12/2023 Surgery   Bilateral mastectomies: Right breast: Benign  left mastectomy: Grade 3 IDC 6 cm with high-grade DCIS, margins negative, 1 intramammary lymph node negative, 0/2 sentinel lymph nodes, ER 0%, PR 0%, HER2 0, Ki-67 30%     CHIEF COMPLIANT: Follow-up after surgery  INTERVAL HISTORY: Audrey Peters is a  67 y.o. with above-mentioned history of breast cancer. She presents to the clinic for a follow-up after undergoing bilateral breast ectomy's.  She is quite uncomfortable with bilateral drains.  Otherwise pain is under reasonable control.  She is here to discuss starting adjuvant chemotherapy.   ALLERGIES:  is allergic to canagliflozin, empagliflozin, other, sulfa antibiotics, and ciprofloxacin.  MEDICATIONS:  Current Outpatient Medications  Medication Sig Dispense Refill   acetaminophen (TYLENOL) 650 MG CR tablet Take 650 mg by mouth every 8 (eight) hours as needed for pain.     atorvastatin (LIPITOR) 10 MG tablet 1 tablet     clotrimazole (LOTRIMIN) 1 % cream Apply 1 application topically daily as needed (for irritated/itchy skin.).      clotrimazole-betamethasone (LOTRISONE) cream      clotrimazole-betamethasone (LOTRISONE) cream APPLY TO AFFECTED AREA TWICE A DAY.  Cobalamin Combinations (B12 FOLATE) 800-800 MCG CAPS See admin instructions.     Continuous Blood Gluc Sensor (FREESTYLE LIBRE 2 SENSOR) MISC .     Dulaglutide  (TRULICITY) 3 MG/0.5ML SOPN 3  mg     DULoxetine (CYMBALTA) 30 MG capsule Take 30 mg by mouth daily.     gabapentin (NEURONTIN) 100 MG capsule Take 100 mg by mouth 3 (three) times daily.     glucose blood (ONETOUCH VERIO) test strip use to check blood sugar     glucose blood test strip by miscellaneous route.     Insulin Pen Needle (B-D ULTRAFINE III SHORT PEN) 31G X 8 MM MISC 3 (three) times daily.     insulin regular human CONCENTRATED (HUMULIN R U-500 KWIKPEN) 500 UNIT/ML kwikpen 150 units before breakfast, 80 units before lunch, 80 units before evening meal     losartan (COZAAR) 100 MG tablet Take 100 mg by mouth daily.     metFORMIN (GLUCOPHAGE-XR) 500 MG 24 hr tablet 2 tablets with evening meal     methocarbamol (ROBAXIN) 500 MG tablet Take 1 tablet (500 mg total) by mouth every 6 (six) hours as needed for muscle spasms. 20 tablet 3   neomycin-polymyxin b-dexamethasone (MAXITROL) 3.5-10000-0.1 SUSP 1 drop 4 (four) times daily.     ondansetron (ZOFRAN-ODT) 4 MG disintegrating tablet Take 1 tablet (4 mg total) by mouth every 6 (six) hours as needed for nausea. 20 tablet 0   ONETOUCH VERIO test strip 2 (two) times daily.     oxyCODONE (OXY IR/ROXICODONE) 5 MG immediate release tablet Take 1 tablet (5 mg total) by mouth every 4 (four) hours as needed for moderate pain. 30 tablet 0   pregabalin (LYRICA) 150 MG capsule 1 capsule     Semaglutide,0.25 or 0.5MG /DOS, 2 MG/1.5ML SOPN Inject 0.25 mg into the skin once a week.     senna (SENOKOT) 8.6 MG TABS tablet Take 1 tablet (8.6 mg total) by mouth 2 (two) times daily. 60 tablet 0   tobramycin-dexamethasone (TOBRADEX) ophthalmic solution SMARTSIG:In Eye(s)     triamterene-hydrochlorothiazide (MAXZIDE-25) 37.5-25 MG tablet Take 1 tablet by mouth every morning.     TRULICITY 1.5 MG/0.5ML SOPN SMARTSIG:0.5 Milliliter(s) SUB-Q Once a Week     No current facility-administered medications for this visit.    PHYSICAL EXAMINATION: ECOG PERFORMANCE  STATUS: 1 - Symptomatic but completely ambulatory  Vitals:   03/22/23 1044  BP: 138/71  Pulse: 74  Resp: 18  Temp: (!) 97.3 F (36.3 C)  SpO2: 98%   Filed Weights   03/22/23 1044  Weight: 214 lb 1.6 oz (97.1 kg)      LABORATORY DATA:  I have reviewed the data as listed    Latest Ref Rng & Units 03/06/2023    9:51 AM 02/04/2023    8:37 AM 01/24/2023    5:32 PM  CMP  Glucose 70 - 99 mg/dL 161  096    BUN 8 - 23 mg/dL 23  20    Creatinine 0.45 - 1.00 mg/dL 4.09  8.11  9.14   Sodium 135 - 145 mmol/L 134  138    Potassium 3.5 - 5.1 mmol/L 4.3  3.7    Chloride 98 - 111 mmol/L 101  104    CO2 22 - 32 mmol/L 24  28    Calcium 8.9 - 10.3 mg/dL 9.1  9.1    Total Protein 6.5 - 8.1 g/dL  6.8    Total Bilirubin 0.3 - 1.2 mg/dL  0.6  Alkaline Phos 38 - 126 U/L  62    AST 15 - 41 U/L  34    ALT 0 - 44 U/L  24      Lab Results  Component Value Date   WBC 5.4 02/04/2023   HGB 12.2 02/04/2023   HCT 38.6 02/04/2023   MCV 88.1 02/04/2023   PLT 178 02/04/2023   NEUTROABS 2.7 02/04/2023    ASSESSMENT & PLAN:  Malignant neoplasm of upper-outer quadrant of left breast in female, estrogen receptor negative (HCC) 04/30/2017: Left lumpectomy: IDC grade 3, 1.7 cm, DCIS, lymphovascular invasion present, margins negative, 0/1 lymph node negative, ER 0%, PR 0%, HER-2 negative ratio 1.37, Ki-67 40%, T1c N0 stage IB    Recommendation: 1. adjuvant chemotherapy with dose dense Adriamycin and Cytoxan 4 followed by Taxol weekly 6 discontinued for neuropathy 2. 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  This is especially worse at night.   Severe fatigue: exercizing. Obesity: Encouraged her to work on her diet to lose some weight. Patient's comorbidities for neuropathy also includes diabetes.  I  stressed the importance of controlling her weight and blood sugars.   Breast cancer recurrence: mammogram 12/10/2022: Asymmetry/distortion left breast, ultrasound: 2 adjacent irregular masses 2 o'clock position left breast 1.9 cm and 1.2 cm, no suspicious lymph nodes Left breast biopsy 2:00 posterior: Grade 2 IDC with DCIS Left breast biopsy 2:00 anterior: Grade 2 IDC with DCIS ER 0%, PR 0%, HER2 0, Ki-67 30% 01/31/2023: Bone scan: Negative, CT CAP 01/26/2023: Negative for metastatic disease but nodular contour of the liver suggestive of cirrhosis 01/26/2023: Ultrasound left submandibular neck: Normal lymph node 1.2 cm 01/24/2023: MRI breast: The recently biopsied area measured 6.1 cm, skin thickening (post radiation)   Recommendation: 03/12/2023: Bilateral mastectomies: Right breast: Benign left mastectomy: Grade 3 IDC 6 cm with high-grade DCIS, margins negative, 1 intramammary lymph node negative, 0/2 sentinel lymph nodes, ER 0%, PR 0%, HER2 0, Ki-67 30% Followed by adjuvant chemotherapy with CMF x 6 cycles   Return to clinic in 04/18/23 to start adjuvant chemotherapy with CMF.    No orders of the defined types were placed in this encounter.  The patient has a good understanding of the overall plan. she agrees with it. she will call with any problems that may develop before the next visit here. Total time spent: 30 mins including face to face time and time spent for planning, charting and co-ordination of care   Tamsen Meek, MD 03/22/23    I Janan Ridge am acting as a Neurosurgeon for [employer]  I have reviewed the above documentation for accuracy and completeness, and I agree with the above.

## 2023-03-22 ENCOUNTER — Other Ambulatory Visit: Payer: Self-pay

## 2023-03-22 ENCOUNTER — Encounter: Payer: Self-pay | Admitting: Hematology and Oncology

## 2023-03-22 ENCOUNTER — Inpatient Hospital Stay: Payer: BC Managed Care – PPO | Admitting: Hematology and Oncology

## 2023-03-22 VITALS — BP 138/71 | HR 74 | Temp 97.3°F | Resp 18 | Ht 64.0 in | Wt 214.1 lb

## 2023-03-22 DIAGNOSIS — Z171 Estrogen receptor negative status [ER-]: Secondary | ICD-10-CM | POA: Diagnosis not present

## 2023-03-22 DIAGNOSIS — G620 Drug-induced polyneuropathy: Secondary | ICD-10-CM | POA: Diagnosis not present

## 2023-03-22 DIAGNOSIS — C50412 Malignant neoplasm of upper-outer quadrant of left female breast: Secondary | ICD-10-CM | POA: Diagnosis not present

## 2023-03-22 MED ORDER — LIDOCAINE-PRILOCAINE 2.5-2.5 % EX CREA
TOPICAL_CREAM | CUTANEOUS | 3 refills | Status: DC
Start: 2023-03-22 — End: 2023-11-12

## 2023-03-22 MED ORDER — PROCHLORPERAZINE MALEATE 10 MG PO TABS: 10.0000 mg | ORAL_TABLET | Freq: Four times a day (QID) | ORAL | 1 refills | Status: DC | PRN

## 2023-03-22 MED ORDER — ONDANSETRON HCL 8 MG PO TABS: 8.0000 mg | ORAL_TABLET | Freq: Three times a day (TID) | ORAL | 1 refills | Status: DC | PRN

## 2023-03-22 NOTE — Assessment & Plan Note (Addendum)
04/30/2017: Left lumpectomy: IDC grade 3, 1.7 cm, DCIS, lymphovascular invasion present, margins negative, 0/1 lymph node negative, ER 0%, PR 0%, HER-2 negative ratio 1.37, Ki-67 40%, T1c N0 stage IB    Recommendation: 1. adjuvant chemotherapy with dose dense Adriamycin and Cytoxan 4 followed by Taxol weekly 6 discontinued for neuropathy 2. 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  This is especially worse at night.   Severe fatigue: exercizing. Obesity: Encouraged her to work on her diet to lose some weight. Patient's comorbidities for neuropathy also includes diabetes.  I stressed the importance of controlling her weight and blood sugars.   Breast cancer recurrence: mammogram 12/10/2022: Asymmetry/distortion left breast, ultrasound: 2 adjacent irregular masses 2 o'clock position left breast 1.9 cm and 1.2 cm, no suspicious lymph nodes Left breast biopsy 2:00 posterior: Grade 2 IDC with DCIS Left breast biopsy 2:00 anterior: Grade 2 IDC with DCIS ER 0%, PR 0%, HER2 0, Ki-67 30% 01/31/2023: Bone scan: Negative, CT CAP 01/26/2023: Negative for metastatic disease but nodular contour of the liver suggestive of cirrhosis 01/26/2023: Ultrasound left submandibular neck: Normal lymph node 1.2 cm 01/24/2023: MRI breast: The recently biopsied area measured 6.1 cm, skin thickening (post radiation)   Recommendation: 03/12/2023: Bilateral mastectomies: Right breast: Benign left mastectomy: Grade 3 IDC 6 cm with high-grade DCIS, margins negative, 1 intramammary lymph node negative, 0/2 sentinel lymph nodes, ER 0%, PR 0%, HER2 0, Ki-67 30% Followed by adjuvant chemotherapy with CMF x 6 cycles   Return to clinic in 2 weeks to start adjuvant chemotherapy with CMF.

## 2023-03-22 NOTE — Progress Notes (Signed)
DISCONTINUE ON PATHWAY REGIMEN - Breast   Doxorubicin + Cyclophosphamide (AC):   A cycle is every 21 days:     Doxorubicin      Cyclophosphamide    Paclitaxel 80 mg/m2 Weekly:   Administer weekly:     Paclitaxel   **Always confirm dose/schedule in your pharmacy ordering system**  REASON: Disease Progression PRIOR TREATMENT: [id number]: AC-T - [Doxorubicin + Cyclophosphamide q21 Days x 4 Cycles, Followed by Paclitaxel Weekly x 12 Weeks] TREATMENT RESPONSE: Unable to Evaluate  START OFF PATHWAY REGIMEN - Breast   [id number]:CMF (Cyclophosphamide IV + Methotrexate IV + Fluorouracil IV) q21 Days:   A cycle is every 21 days:     Cyclophosphamide      Fluorouracil      Methotrexate   **Always confirm dose/schedule in your pharmacy ordering system**  Patient Characteristics: Postoperative without Neoadjuvant Therapy, M0 (Pathologic Staging), Invasive Disease, Adjuvant Therapy, HER2 Negative, ER Negative, Node Negative, pT1a-c, N39mi or pT1c or Higher, pN0 Therapeutic Status: Postoperative without Neoadjuvant Therapy, M0 (Pathologic Staging) AJCC Grade: G2 AJCC N Category: pN0 AJCC M Category: cM0 ER Status: Negative (-) AJCC 8 Stage Grouping: IIB HER2 Status: Negative (-) Oncotype Dx Recurrence Score: Not Appropriate AJCC T Category: pT3 PR Status: Negative (-) Intent of Therapy: Curative Intent, Discussed with Patient

## 2023-03-23 ENCOUNTER — Other Ambulatory Visit: Payer: Self-pay

## 2023-03-25 ENCOUNTER — Telehealth: Payer: Self-pay | Admitting: Hematology and Oncology

## 2023-03-25 ENCOUNTER — Encounter: Payer: Self-pay | Admitting: *Deleted

## 2023-03-25 NOTE — Telephone Encounter (Signed)
Patient is aware of scheduled appointment times/dates

## 2023-04-03 DIAGNOSIS — Z794 Long term (current) use of insulin: Secondary | ICD-10-CM | POA: Diagnosis not present

## 2023-04-03 DIAGNOSIS — E1165 Type 2 diabetes mellitus with hyperglycemia: Secondary | ICD-10-CM | POA: Diagnosis not present

## 2023-04-03 DIAGNOSIS — E1140 Type 2 diabetes mellitus with diabetic neuropathy, unspecified: Secondary | ICD-10-CM | POA: Diagnosis not present

## 2023-04-09 ENCOUNTER — Inpatient Hospital Stay: Payer: BC Managed Care – PPO

## 2023-04-09 ENCOUNTER — Inpatient Hospital Stay: Payer: BC Managed Care – PPO | Admitting: Pharmacist

## 2023-04-09 DIAGNOSIS — Z171 Estrogen receptor negative status [ER-]: Secondary | ICD-10-CM

## 2023-04-09 DIAGNOSIS — C50412 Malignant neoplasm of upper-outer quadrant of left female breast: Secondary | ICD-10-CM | POA: Diagnosis not present

## 2023-04-09 NOTE — Progress Notes (Signed)
[facility]       Telephone: [phone]?Fax: [phone]   Oncology Clinical Pharmacist Practitioner Initial Assessment  Audrey Peters is a 67 y.o. female with a diagnosis of breast cancer. They were contacted today via in-person visit.  Indication/Regimen CMF: cyclophosphamide, methotrexate, fluorouracil are being used appropriately for treatment of breast cancer by Dr. [name].      Wt Readings from Last 1 Encounters:  03/22/23 214 lb 1.6 oz (97.1 kg)    Estimated body surface area is 2.09 meters squared as calculated from the following:   Height as of 03/22/23: 5\' 4"  (1.626 m).   Weight as of 03/22/23: 214 lb 1.6 oz (97.1 kg).  The dosing regimen is every 21 days for 6 cycles  Cyclophosphamide (600 mg/m2) on Day 1 Methotrexate (40 mg/m2) on Day 1 Fluorouracil (600 mg/m2) on Day 1  Dose Modifications Dr. Pamelia Hoit is not prescribing dexamethasone for take home meds as patient is diabetic. Will be used as a premedication for now.   Allergies Allergies  Allergen Reactions   Canagliflozin Other (See Comments)    Caused a yeast infection Other reaction(s): yeast infections   Empagliflozin     Other reaction(s): yeast infections   Other     Other reaction(s): palpitation Other reaction(s): Unknown   Sulfa Antibiotics Itching and Other (See Comments)    Unsure of exact reaction type   Ciprofloxacin Palpitations    Vitals: No vitals or labs were done today for this chemotherapy education visit   Contraindications Contraindications were reviewed? Yes Contraindications to therapy were identified? No   Safety Precautions The following safety precautions for the use of CMF were reviewed:  Fever: reviewed the importance of having a thermometer and the Centers for Disease Control and Prevention (CDC) definition of fever which is 100.48F (38C) or higher. Patient should call 24/7 triage at [phone] if experiencing a fever or any other  symptoms Decreased hemoglobin Decreased platelets Decreased white blood cells Fatigue Diarrhea Mucositis Nausea Vomiting Alopecia Skin reactions Kidney injury Liver injury Cardiotoxicity Secondary malignancies Drug-Drug interactions Conditions such as pleural effusions, ascites VTE Pneumonitis Importance of drinking plenty of fluids Handling body fluids and waste Intimacy, sexual activity, contraception, and fertility if applicable  Medication Reconciliation Current Outpatient Medications  Medication Sig Dispense Refill   acetaminophen (TYLENOL) 650 MG CR tablet Take 650 mg by mouth every 8 (eight) hours as needed for pain.     atorvastatin (LIPITOR) 10 MG tablet 1 tablet     Continuous Blood Gluc Sensor (FREESTYLE LIBRE 2 SENSOR) MISC .     DULoxetine (CYMBALTA) 30 MG capsule Take 30 mg by mouth daily.     glucose blood (ONETOUCH VERIO) test strip use to check blood sugar     glucose blood test strip by miscellaneous route.     Insulin Pen Needle (B-D ULTRAFINE III SHORT PEN) 31G X 8 MM MISC 3 (three) times daily.     insulin regular human CONCENTRATED (HUMULIN R U-500 KWIKPEN) 500 UNIT/ML kwikpen 150 units before breakfast, 80 units before lunch, 80 units before evening meal     losartan (COZAAR) 100 MG tablet Take 100 mg by mouth daily.     metFORMIN (GLUCOPHAGE-XR) 500 MG 24 hr tablet 2 tablets with evening meal     ONETOUCH VERIO test strip 2 (two) times daily.     pregabalin (LYRICA) 150 MG capsule 1 capsule     Semaglutide,0.25 or 0.5MG /DOS, 2 MG/1.5ML SOPN Inject 0.25 mg into  the skin once a week.     triamterene-hydrochlorothiazide (MAXZIDE-25) 37.5-25 MG tablet Take 1 tablet by mouth every morning.     clotrimazole-betamethasone (LOTRISONE) cream APPLY TO AFFECTED AREA TWICE A DAY. (Patient not taking: Reported on 04/09/2023)     gabapentin (NEURONTIN) 100 MG capsule Take 100 mg by mouth 3 (three) times daily. (Patient not taking: Reported on 04/09/2023)      lidocaine-prilocaine (EMLA) cream Apply to affected area once (Patient not taking: Reported on 04/09/2023) 30 g 3   methocarbamol (ROBAXIN) 500 MG tablet Take 1 tablet (500 mg total) by mouth every 6 (six) hours as needed for muscle spasms. (Patient not taking: Reported on 04/09/2023) 20 tablet 3   ondansetron (ZOFRAN) 8 MG tablet Take 1 tablet (8 mg total) by mouth every 8 (eight) hours as needed for nausea or vomiting. Start on the third day after chemotherapy. (Patient not taking: Reported on 04/09/2023) 30 tablet 1   oxyCODONE (OXY IR/ROXICODONE) 5 MG immediate release tablet Take 1 tablet (5 mg total) by mouth every 4 (four) hours as needed for moderate pain. (Patient not taking: Reported on 04/09/2023) 30 tablet 0   prochlorperazine (COMPAZINE) 10 MG tablet Take 1 tablet (10 mg total) by mouth every 6 (six) hours as needed for nausea or vomiting. (Patient not taking: Reported on 04/09/2023) 30 tablet 1   senna (SENOKOT) 8.6 MG TABS tablet Take 1 tablet (8.6 mg total) by mouth 2 (two) times daily. (Patient not taking: Reported on 04/09/2023) 60 tablet 0   No current facility-administered medications for this visit.    Medication reconciliation is based on the patient's most recent medication list in the electronic medical record (EMR) including herbal products and OTC medications.   The patient's medication list was reviewed today with the patient? Yes   Drug-drug interactions (DDIs) DDIs were evaluated? Yes Significant DDIs identified?  No, she will be checking if she takes pregabalin and gabapentin. She will let the nurses know when next seen. She felt like she likely just takes pregabalin (Lyrica) and not gabapentin (Neurontin)  Drug-Food Interactions Drug-food interactions were evaluated? Yes Drug-food interactions identified? No   Follow-up Plan  Treatment start date: 04/18/23 Port placement date: 03/12/23 -- instructions on how to use EMLA cream given today We reviewed the prescriptions,  premedications, and treatment regimen with the patient. Possible side effects of the treatment regimen were reviewed and management strategies were discussed.  Can use loperamide as needed for diarrhea and Senna-S as needed for constipation.  She will check if she is currently taking gabapentin and pregabalin which we discussed are similar medications in regards to how they work Clinical pharmacy will assist Dr. [name] and Audrey Peters on an as needed basis going forward  Audrey Peters participated in the discussion, expressed understanding, and voiced agreement with the above plan. All questions were answered to her satisfaction. The patient was advised to contact the clinic at [phone] with any questions or concerns prior to her return visit.   I spent 60 minutes assessing the patient.  Wyman Meschke A. Odetta Pink, PharmD, BCOP, CPP  Anselm Lis, RPH-CPP, 04/09/2023 11:57 AM  **Disclaimer: This note was dictated with voice recognition software. Similar sounding words can inadvertently be transcribed and this note may contain transcription errors which may not have been corrected upon publication of note.**

## 2023-04-10 NOTE — Progress Notes (Signed)
Pharmacist Chemotherapy Monitoring - Initial Assessment    Anticipated start date:  04/18/23   The following has been reviewed per standard work regarding the patient's treatment regimen: The patient's diagnosis, treatment plan and drug doses, and organ/hematologic function Lab orders and baseline tests specific to treatment regimen  The treatment plan start date, drug sequencing, and pre-medications Prior authorization status  Patient's documented medication list, including drug-drug interaction screen and prescriptions for anti-emetics and supportive care specific to the treatment regimen The drug concentrations, fluid compatibility, administration routes, and timing of the medications to be used The patient's access for treatment and lifetime cumulative dose history, if applicable  The patient's medication allergies and previous infusion related reactions, if applicable   Changes made to treatment plan:  N/A  Follow up needed:  N/A   Demetrius Charity, RPH, 04/10/2023  4:35 PM

## 2023-04-16 ENCOUNTER — Encounter: Payer: Self-pay | Admitting: Hematology and Oncology

## 2023-04-16 NOTE — Progress Notes (Signed)
Received voicemail from patient after receiving my card per my request.  Returned call to patient and left voicemail for her to return my call with my contact name and number.

## 2023-04-17 MED FILL — Dexamethasone Sodium Phosphate Inj 100 MG/10ML: INTRAMUSCULAR | Qty: 1 | Status: AC

## 2023-04-18 ENCOUNTER — Inpatient Hospital Stay: Payer: Medicare Other

## 2023-04-18 ENCOUNTER — Inpatient Hospital Stay: Payer: Medicare Other | Admitting: Hematology and Oncology

## 2023-04-18 ENCOUNTER — Telehealth: Payer: Self-pay

## 2023-04-18 NOTE — Telephone Encounter (Signed)
Received message from charge RN this morning regarding pt's appt for port flush w/lab. Pt was a no show.   Called pt and she explained to me that she spoke with a nurse at our office 04/17/23 who advised her appts would need to be cancelled, as our office received a call from Dr Donell Beers advising we defer first tx one week out d/t drainage of a seroma to surgical site.   Apologies extended to pt for call and advised I would cancel her appts and send a message to the schedulers to have her tx dates fixed. She verbalized thanks and understanding. Risk analyst of this.

## 2023-04-18 NOTE — Progress Notes (Signed)
Patient Care Team: [name], MD as PCP - General (Hematology and Oncology) [name], MD as Consulting Physician (Hematology and Oncology) Lonie Peak, MD as Attending Physician (Radiation Oncology) Harriette Bouillon, MD as Consulting Physician (General Surgery) Axel Filler Larna Daughters, NP as Nurse Practitioner (Hematology and Oncology)  DIAGNOSIS:  Encounter Diagnosis  Name Primary?   Malignant neoplasm of upper-outer quadrant of left breast in female, estrogen receptor negative (HCC) Yes    SUMMARY OF ONCOLOGIC HISTORY: Oncology History  Malignant neoplasm of upper-outer quadrant of left breast in female, estrogen receptor negative (HCC)  04/05/2017 Initial Diagnosis   Left breast asymmetry by ultrasound measured 1.3 cm at 2:30 position 10 cm from nipple, no axillary lymph nodes; biopsy IDC grade 2, ER 0%, PR 0%, HER-2 negative ratio 1.37, Ki-67 40%, T1c N0 stage IB AJCC 8    04/30/2017 Surgery   Left lumpectomy: IDC grade 3, 1.7 cm, DCIS, lymphovascular invasion present, margins negative, 0/1 lymph node negative, ER 0%, PR 0%, HER-2 negative ratio 1.37, Ki-67 40%, T1c N0 stage IB   05/22/2017 Genetic Testing   Patient had genetic testing due to a personal history of triple negative breast cancer.  The Common Hereditary Cancer Panel was ordered. The Hereditary Gene Panel offered by [facility] includes sequencing and/or deletion duplication testing of the following 46 genes: APC, ATM, AXIN2, BARD1, BMPR1A, BRCA1, BRCA2, BRIP1, CDH1, CDKN2A (p14ARF), CDKN2A (p16INK4a), CHEK2, CTNNA1, DICER1, EPCAM (Deletion/duplication testing only), GREM1 (promoter region deletion/duplication testing only), KIT, MEN1, MLH1, MSH2, MSH3, MSH6, MUTYH, NBN, NF1, NHTL1, PALB2, PDGFRA, PMS2, POLD1, POLE, PTEN, RAD50, RAD51C, RAD51D, SDHB, SDHC, SDHD, SMAD4, SMARCA4. STK11, TP53, TSC1, TSC2, and VHL.  The following genes were evaluated for sequence changes only: SDHA and HOXB13 c.251G>A variant only.     Results: No pathogenic mutations identified.  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  The date of this test report is 05/22/2017.    05/24/2017 - 08/30/2017 Chemotherapy   Dose dense Adriamycin and Cytoxan 4 followed by Taxol weekly 5 (stopped early for neuropathy)    09/26/2017 - 10/22/2017 Radiation Therapy   Adjuvant radiation therapy   12/10/2022 Relapse/Recurrence   Mammogram detected distortion, 2 irregular masses 1.9 cm and 1.2 cm: Biopsy grade 2 IDC with DCIS triple negative, scans negative, MRI breast: 6.1 cm   03/12/2023 Surgery   Bilateral mastectomies: Right breast: Benign  left mastectomy: Grade 3 IDC 6 cm with high-grade DCIS, margins negative, 1 intramammary lymph node negative, 0/2 sentinel lymph nodes, ER 0%, PR 0%, HER2 0, Ki-67 30%   04/18/2023 -  Chemotherapy   Patient is on Treatment Plan : BREAST Adjuvant CMF IV q21d       CHIEF COMPLIANT: Follow-up CMF  INTERVAL HISTORY: Audrey Peters is a  67 y.o. with above-mentioned history of breast cancer. She presents to the clinic for a follow-up    ALLERGIES:  is allergic to canagliflozin, empagliflozin, other, sulfa antibiotics, and ciprofloxacin.  MEDICATIONS:  Current Outpatient Medications  Medication Sig Dispense Refill   acetaminophen (TYLENOL) 650 MG CR tablet Take 650 mg by mouth every 8 (eight) hours as needed for pain.     atorvastatin (LIPITOR) 10 MG tablet 1 tablet     clotrimazole-betamethasone (LOTRISONE) cream APPLY TO AFFECTED AREA TWICE A DAY. (Patient not taking: Reported on 04/09/2023)     Continuous Blood Gluc Sensor (FREESTYLE LIBRE 2 SENSOR) MISC .     DULoxetine (CYMBALTA) 30 MG capsule Take 30 mg by mouth daily.  gabapentin (NEURONTIN) 100 MG capsule Take 100 mg by mouth 3 (three) times daily. (Patient not taking: Reported on 04/09/2023)     glucose blood (ONETOUCH VERIO) test strip use to check blood sugar     glucose blood test strip by miscellaneous route.     Insulin  Pen Needle (B-D ULTRAFINE III SHORT PEN) 31G X 8 MM MISC 3 (three) times daily.     insulin regular human CONCENTRATED (HUMULIN R U-500 KWIKPEN) 500 UNIT/ML kwikpen 150 units before breakfast, 80 units before lunch, 80 units before evening meal     lidocaine-prilocaine (EMLA) cream Apply to affected area once (Patient not taking: Reported on 04/09/2023) 30 g 3   losartan (COZAAR) 100 MG tablet Take 100 mg by mouth daily.     metFORMIN (GLUCOPHAGE-XR) 500 MG 24 hr tablet 2 tablets with evening meal     methocarbamol (ROBAXIN) 500 MG tablet Take 1 tablet (500 mg total) by mouth every 6 (six) hours as needed for muscle spasms. (Patient not taking: Reported on 04/09/2023) 20 tablet 3   ondansetron (ZOFRAN) 8 MG tablet Take 1 tablet (8 mg total) by mouth every 8 (eight) hours as needed for nausea or vomiting. Start on the third day after chemotherapy. (Patient not taking: Reported on 04/09/2023) 30 tablet 1   ONETOUCH VERIO test strip 2 (two) times daily.     oxyCODONE (OXY IR/ROXICODONE) 5 MG immediate release tablet Take 1 tablet (5 mg total) by mouth every 4 (four) hours as needed for moderate pain. (Patient not taking: Reported on 04/09/2023) 30 tablet 0   pregabalin (LYRICA) 150 MG capsule 1 capsule     prochlorperazine (COMPAZINE) 10 MG tablet Take 1 tablet (10 mg total) by mouth every 6 (six) hours as needed for nausea or vomiting. (Patient not taking: Reported on 04/09/2023) 30 tablet 1   Semaglutide,0.25 or 0.5MG /DOS, 2 MG/1.5ML SOPN Inject 0.25 mg into the skin once a week.     senna (SENOKOT) 8.6 MG TABS tablet Take 1 tablet (8.6 mg total) by mouth 2 (two) times daily. (Patient not taking: Reported on 04/09/2023) 60 tablet 0   triamterene-hydrochlorothiazide (MAXZIDE-25) 37.5-25 MG tablet Take 1 tablet by mouth every morning.     No current facility-administered medications for this visit.    PHYSICAL EXAMINATION: ECOG PERFORMANCE STATUS: {CHL ONC ECOG PS:[phone]}  There were no vitals filed  for this visit. There were no vitals filed for this visit.  BREAST:*** No palpable masses or nodules in either right or left breasts. No palpable axillary supraclavicular or infraclavicular adenopathy no breast tenderness or nipple discharge. (exam performed in the presence of a chaperone)  LABORATORY DATA:  I have reviewed the data as listed    Latest Ref Rng & Units 03/06/2023    9:51 AM 02/04/2023    8:37 AM 01/24/2023    5:32 PM  CMP  Glucose 70 - 99 mg/dL 244  010    BUN 8 - 23 mg/dL 23  20    Creatinine 2.72 - 1.00 mg/dL 5.36  6.44  0.34   Sodium 135 - 145 mmol/L 134  138    Potassium 3.5 - 5.1 mmol/L 4.3  3.7    Chloride 98 - 111 mmol/L 101  104    CO2 22 - 32 mmol/L 24  28    Calcium 8.9 - 10.3 mg/dL 9.1  9.1    Total Protein 6.5 - 8.1 g/dL  6.8    Total Bilirubin 0.3 - 1.2 mg/dL  0.6  Alkaline Phos 38 - 126 U/L  62    AST 15 - 41 U/L  34    ALT 0 - 44 U/L  24      Lab Results  Component Value Date   WBC 5.4 02/04/2023   HGB 12.2 02/04/2023   HCT 38.6 02/04/2023   MCV 88.1 02/04/2023   PLT 178 02/04/2023   NEUTROABS 2.7 02/04/2023    ASSESSMENT & PLAN:  No problem-specific Assessment & Plan notes found for this encounter.    No orders of the defined types were placed in this encounter.  The patient has a good understanding of the overall plan. she agrees with it. she will call with any problems that may develop before the next visit here. Total time spent: 30 mins including face to face time and time spent for planning, charting and co-ordination of care   Sherlyn Lick, CMA 04/18/23    I Janan Ridge am acting as a Neurosurgeon for [employer]  ***

## 2023-04-18 NOTE — Assessment & Plan Note (Deleted)
04/30/2017: Left lumpectomy: IDC grade 3, 1.7 cm, DCIS, lymphovascular invasion present, margins negative, 0/1 lymph node negative, ER 0%, PR 0%, HER-2 negative ratio 1.37, Ki-67 40%, T1c N0 stage IB    Recommendation: 1. adjuvant chemotherapy with dose dense Adriamycin and Cytoxan 4 followed by Taxol weekly 6 discontinued for neuropathy on 08/30/2017 2. Followed by radiation started 09/26/2017-10/25/2018  ---------------------------------------------------------------------------------------------------------------------------  Breast cancer recurrence: mammogram 12/10/2022: Asymmetry/distortion left breast, ultrasound: 2 adjacent irregular masses 2 o'clock position left breast 1.9 cm and 1.2 cm, no suspicious lymph nodes Left breast biopsy 2:00 posterior: Grade 2 IDC with DCIS Left breast biopsy 2:00 anterior: Grade 2 IDC with DCIS ER 0%, PR 0%, HER2 0, Ki-67 30% 01/31/2023: Bone scan: Negative, CT CAP 01/26/2023: Negative for metastatic disease but nodular contour of the liver suggestive of cirrhosis 01/26/2023: Ultrasound left submandibular neck: Normal lymph node 1.2 cm 01/24/2023: MRI breast: The recently biopsied area measured 6.1 cm, skin thickening (post radiation)   Recommendation: 03/12/2023: Bilateral mastectomies: Right breast: Benign left mastectomy: Grade 3 IDC 6 cm with high-grade DCIS, margins negative, 1 intramammary lymph node negative, 0/2 sentinel lymph nodes, ER 0%, PR 0%, HER2 0, Ki-67 30% Followed by adjuvant chemotherapy with CMF x 6 cycles ----------------------------------------------------------------------------------------------------------------------- Current treatment: Cycle 1 CMF Labs reviewed antiemetics were reviewed Key medication completed chemo consent obtained. Return to clinic in 1 week for toxicity check and labs

## 2023-04-22 NOTE — Therapy (Signed)
OUTPATIENT PHYSICAL THERAPY  UPPER EXTREMITY ONCOLOGY EVALUATION  Patient Name: Audrey Peters MRN: [id number] DOB:[birth year], 67 y.o., female Today's Date: 04/22/2023  END OF SESSION:   Past Medical History:  Diagnosis Date   Anemia yrs ago   Arthritis    Breast cancer (HCC)    Cancer (HCC)    recent dx in breast   Carpal tunnel syndrome of right wrist    Diabetes mellitus without complication (HCC)    dx 2008   Headache    sinus   Hypertension    Peripheral neuropathy 2024   Hands and Feet   Personal history of chemotherapy    Personal history of radiation therapy    Vaginal delivery 1983   Past Surgical History:  Procedure Laterality Date   BREAST BIOPSY     BREAST BIOPSY Left 01/01/2023   [place] LT BREAST BX W LOC DEV 1ST LESION IMG BX SPEC US GUIDE 01/01/2023 [facility]   BREAST BIOPSY Left 01/01/2023   [place] LT BREAST BX W LOC DEV EA ADD LESION IMG BX SPEC US GUIDE 01/01/2023 [facility]   BREAST LUMPECTOMY Left    BREAST LUMPECTOMY WITH RADIOACTIVE SEED AND SENTINEL LYMPH NODE BIOPSY Left 04/30/2017   Procedure: LEFT BREAST LUMPECTOMY WITH RADIOACTIVE SEED AND LEFT SENTINEL LYMPH NODE BIOPSY ERAS PATHWAY;  Surgeon: Harriette Bouillon, MD;  Location: [facility];  Service: General;  Laterality: Left;   COLONOSCOPY WITH PROPOFOL N/A 05/28/2016   Procedure: COLONOSCOPY WITH PROPOFOL;  Surgeon: Charolett Bumpers, MD;  Location: [facility];  Service: Endoscopy;  Laterality: N/A;   DILATATION & CURETTAGE/HYSTEROSCOPY WITH MYOSURE N/A 04/20/2020   Procedure: DILATATION & CURETTAGE/HYSTEROSCOPY WITH MYOSURE;  Surgeon: Myna Hidalgo, DO;  Location: [facility];  Service: Gynecology;  Laterality: N/A;   DILATION AND CURETTAGE OF UTERUS     HYSTEROSCOPY WITH D & C N/A 07/21/2015   Procedure: DILATATION AND CURETTAGE /HYSTEROSCOPY with myosure;  Surgeon: Myna Hidalgo, DO;  Location: [facility];  Service: Gynecology;  Laterality: N/A;   MASTECTOMY W/ SENTINEL NODE BIOPSY  Left 03/12/2023   Procedure: LEFT MASTECTOMY WITH SENTINEL LYMPH NODE BIOPSY;  Surgeon: Almond Lint, MD;  Location: [facility];  Service: General;  Laterality: Left;   PORTACATH PLACEMENT Right 04/30/2017   Procedure: INSERTION PORT-A-CATH;  Surgeon: Harriette Bouillon, MD;  Location: [facility];  Service: General;  Laterality: Right;   PORTACATH PLACEMENT N/A 03/12/2023   Procedure: PORT PLACEMENT WITH ULTRASOUND GUIDANCE;  Surgeon: Almond Lint, MD;  Location: [facility];  Service: General;  Laterality: N/A;   SIMPLE MASTECTOMY WITH AXILLARY SENTINEL NODE BIOPSY Right 03/12/2023   Procedure: RIGHT MASTECTOMY;  Surgeon: Almond Lint, MD;  Location: [facility];  Service: General;  Laterality: Right;   Patient Active Problem List   Diagnosis Date Noted   Recurrent breast cancer, left (HCC) 03/12/2023   Atrophy of vagina 09/12/2020   Bilateral lower extremity edema 09/12/2020   History of ductal carcinoma in situ of breast 09/12/2020   Hyperglycemia due to type 2 diabetes mellitus (HCC) 09/12/2020   Knee pain 09/12/2020   Long term (current) use of insulin (HCC) 09/12/2020   Lumbosacral spondylosis without myelopathy 09/12/2020   Mixed hyperlipidemia 09/12/2020   Obstructive sleep apnea syndrome 09/12/2020   Ovarian cyst 09/12/2020   Overweight 09/12/2020   Personal history of malignant neoplasm of breast 09/12/2020   Postmenopausal bleeding 09/12/2020   Pure hypercholesterolemia 09/12/2020   Sciatica 09/12/2020   Morbid obesity (HCC) 09/12/2020  Chemotherapy-induced peripheral neuropathy (HCC) 01/22/2018   Encounter for antineoplastic chemotherapy 06/07/2017   Port-A-Cath in place 05/24/2017   Genetic testing 05/23/2017   Malignant neoplasm of upper-outer quadrant of left breast in female, estrogen receptor negative (HCC) 04/22/2017   Essential hypertension 01/30/2015   Diabetes mellitus (HCC) 01/30/2015    PCP: ***  REFERRING PROVIDER:  ***  REFERRING DIAG:  Diagnosis  C50.412,Z17.1 (ICD-10-CM) - Malignant neoplasm of upper-outer quadrant of left breast in female, estrogen receptor negative (HCC)    THERAPY DIAG:  No diagnosis found.  ONSET DATE: 12/2022  Rationale for Evaluation and Treatment: Rehabilitation  SUBJECTIVE:                                                                                                                                                                                           SUBJECTIVE STATEMENT: ***  PERTINENT HISTORY: New/recurrent breast cancer on the left. 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 Port placement and bil mastectomy 03/12/23 with removal of 2 negative nodes. Treatment in 2018 with lumpectomy, SLNB, chemo and XRT.  1 negative node rermoved in 2018.   aspirarted frorm Rt and 150 from Lt 04/16/23. Seroma catheters may be needed.   PAIN:  Are you having pain? {yes/no:20286} NPRS scale: ***/10 Pain location: *** Pain orientation: {Pain Orientation:25161}  PAIN TYPE: {type:313116} Pain description: {PAIN DESCRIPTION:21022940}  Aggravating factors: *** Relieving factors: ***  PRECAUTIONS: Lt lymphedema risk   RED FLAGS: None   WEIGHT BEARING RESTRICTIONS: No  FALLS:  Has patient fallen in last 6 months? {fallsyesno:27318}  LIVING ENVIRONMENT: Lives with: {[facility] lives with:25569::"lives with their family"} Lives in: {Lives in:25570} Stairs: {yes/no:20286}; {Stairs:24000} Has following equipment at home: {Assistive devices:23999}  OCCUPATION: ***  LEISURE: ***  HAND DOMINANCE: {RIGHT/LEFT:21944}   PRIOR LEVEL OF FUNCTION: {PLOF:24004}  PATIENT GOALS: ***   OBJECTIVE:  COGNITION: Overall cognitive status: {cognition:24006}   PALPATION: ***  OBSERVATIONS / OTHER ASSESSMENTS: ***  SENSATION: Light touch: {intact/deficits:24005} Stereognosis: {intact/deficits:24005} Hot/Cold: {intact/deficits:24005} Proprioception:  {intact/deficits:24005}  POSTURE: ***  UPPER EXTREMITY AROM/PROM:  A/PROM RIGHT   eval   Shoulder extension   Shoulder flexion   Shoulder abduction   Shoulder internal rotation   Shoulder external rotation     (Blank rows = not tested)  A/PROM LEFT   eval  Shoulder extension   Shoulder flexion   Shoulder abduction   Shoulder internal rotation   Shoulder external rotation     (Blank rows = not tested)  CERVICAL AROM: All within normal limits:    Percent limited  Flexion   Extension   Right lateral flexion   Left lateral flexion  Right rotation   Left rotation     UPPER EXTREMITY STRENGTH:   LYMPHEDEMA ASSESSMENTS:   SURGERY TYPE/DATE: ***  NUMBER OF LYMPH NODES REMOVED: ***  CHEMOTHERAPY: ***  RADIATION:***  HORMONE TREATMENT: ***  INFECTIONS: ***   LYMPHEDEMA ASSESSMENTS:   LANDMARK RIGHT  eval  At axilla    15 cm proximal to olecranon process   10 cm proximal to olecranon process   Olecranon process   15 cm proximal to ulnar styloid process   10 cm proximal to ulnar styloid process   Just proximal to ulnar styloid process   Across hand at thumb web space   At base of 2nd digit   (Blank rows = not tested)  LANDMARK LEFT  eval  At axilla    15 cm proximal to olecranon process   10 cm proximal to olecranon process   Olecranon process   15 cm proximal to ulnar styloid process   10 cm proximal to ulnar styloid process   Just proximal to ulnar styloid process   Across hand at thumb web space   At base of 2nd digit   (Blank rows = not tested)   FUNCTIONAL TESTS:  {Functional tests:24029}  GAIT: Distance walked: *** Assistive device utilized: {Assistive devices:23999} Level of assistance: {Levels of assistance:24026} Comments: ***  L-DEX LYMPHEDEMA SCREENING: The patient was assessed using the L-Dex machine today to produce a lymphedema index baseline score. The patient will be reassessed on a regular basis (typically every 3  months) to obtain new L-Dex scores. If the score is > 6.5 points away from his/her baseline score indicating onset of subclinical lymphedema, it will be recommended to wear a compression garment for 4 weeks, 12 hours per day and then be reassessed. If the score continues to be > 6.5 points from baseline at reassessment, we will initiate lymphedema treatment. Assessing in this manner has a 95% rate of preventing clinically significant lymphedema.  QUICK DASH SURVEY: ***   TODAY'S TREATMENT:                                                                                                                                          DATE: ***    PATIENT EDUCATION:  Education details: *** Person educated: {Person educated:25204} Education method: {Education Method:25205} Education comprehension: {Education Comprehension:25206}  HOME EXERCISE PROGRAM: ***  ASSESSMENT:  CLINICAL IMPRESSION: Patient is a *** y.o. *** who was seen today for physical therapy evaluation and treatment for ***.    OBJECTIVE IMPAIRMENTS: {opptimpairments:25111}.   ACTIVITY LIMITATIONS: {activitylimitations:27494}  PARTICIPATION LIMITATIONS: {participationrestrictions:25113}  PERSONAL FACTORS: {Personal factors:25162} are also affecting patient's functional outcome.   REHAB POTENTIAL: {rehabpotential:25112}  CLINICAL DECISION MAKING: {clinical decision making:25114}  EVALUATION COMPLEXITY: {Evaluation complexity:25115}  GOALS: Goals reviewed with patient? {yes/no:20286}  SHORT TERM GOALS: Target date: ***  *** Baseline: Goal status: INITIAL  2.  *** Baseline:  Goal status:  INITIAL  3.  *** Baseline:  Goal status: INITIAL  4.  *** Baseline:  Goal status: INITIAL  5.  *** Baseline:  Goal status: INITIAL  6.  *** Baseline:  Goal status: INITIAL  LONG TERM GOALS: Target date: ***  *** Baseline:  Goal status: INITIAL  2.  *** Baseline:  Goal status: INITIAL  3.  *** Baseline:   Goal status: INITIAL  4.  *** Baseline:  Goal status: INITIAL  5.  *** Baseline:  Goal status: INITIAL  6.  *** Baseline:  Goal status: INITIAL  PLAN:  PT FREQUENCY: {rehab frequency:25116}  PT DURATION: {rehab duration:25117}  PLANNED INTERVENTIONS: {rehab planned interventions:25118::"Therapeutic exercises","Therapeutic activity","Neuromuscular re-education","Balance training","Gait training","Patient/Family education","Self Care","Joint mobilization"}  PLAN FOR NEXT SESSION: Idamae Lusher, PT 04/22/2023, 9:50 PM

## 2023-04-23 ENCOUNTER — Encounter: Payer: Self-pay | Admitting: Rehabilitation

## 2023-04-23 ENCOUNTER — Ambulatory Visit: Payer: Medicare Other | Attending: General Surgery | Admitting: Rehabilitation

## 2023-04-23 ENCOUNTER — Other Ambulatory Visit: Payer: Self-pay

## 2023-04-23 DIAGNOSIS — C50412 Malignant neoplasm of upper-outer quadrant of left female breast: Secondary | ICD-10-CM | POA: Diagnosis not present

## 2023-04-23 DIAGNOSIS — Z9013 Acquired absence of bilateral breasts and nipples: Secondary | ICD-10-CM | POA: Diagnosis not present

## 2023-04-23 DIAGNOSIS — M25612 Stiffness of left shoulder, not elsewhere classified: Secondary | ICD-10-CM | POA: Insufficient documentation

## 2023-04-23 DIAGNOSIS — I890 Lymphedema, not elsewhere classified: Secondary | ICD-10-CM | POA: Insufficient documentation

## 2023-04-23 DIAGNOSIS — M25611 Stiffness of right shoulder, not elsewhere classified: Secondary | ICD-10-CM | POA: Insufficient documentation

## 2023-04-23 DIAGNOSIS — Z171 Estrogen receptor negative status [ER-]: Secondary | ICD-10-CM | POA: Insufficient documentation

## 2023-04-30 ENCOUNTER — Encounter: Payer: Self-pay | Admitting: Rehabilitation

## 2023-04-30 ENCOUNTER — Ambulatory Visit: Payer: Medicare Other | Admitting: Rehabilitation

## 2023-04-30 DIAGNOSIS — Z171 Estrogen receptor negative status [ER-]: Secondary | ICD-10-CM

## 2023-04-30 DIAGNOSIS — M25611 Stiffness of right shoulder, not elsewhere classified: Secondary | ICD-10-CM

## 2023-04-30 DIAGNOSIS — I890 Lymphedema, not elsewhere classified: Secondary | ICD-10-CM | POA: Diagnosis not present

## 2023-04-30 DIAGNOSIS — M25612 Stiffness of left shoulder, not elsewhere classified: Secondary | ICD-10-CM

## 2023-04-30 DIAGNOSIS — Z9013 Acquired absence of bilateral breasts and nipples: Secondary | ICD-10-CM | POA: Diagnosis not present

## 2023-04-30 DIAGNOSIS — C50412 Malignant neoplasm of upper-outer quadrant of left female breast: Secondary | ICD-10-CM | POA: Diagnosis not present

## 2023-04-30 NOTE — Therapy (Addendum)
OUTPATIENT PHYSICAL THERAPY  UPPER EXTREMITY ONCOLOGY TREATMENT  Patient Name: Audrey Peters MRN: [id number] DOB:05/11/1956, 67 y.o., female Today's Date: 04/30/2023  END OF SESSION:  PT End of Session - 04/30/23 1002     Visit Number 2    Number of Visits 5    Date for PT Re-Evaluation 05/28/23    PT Start Time 1004    PT Stop Time 1045    PT Time Calculation (min) 41 min    Activity Tolerance Patient tolerated treatment well    Behavior During Therapy WFL for tasks assessed/performed             Past Medical History:  Diagnosis Date   Anemia yrs ago   Arthritis    Breast cancer (HCC)    Cancer (HCC)    recent dx in breast   Carpal tunnel syndrome of right wrist    Diabetes mellitus without complication (HCC)    dx 2008   Headache    sinus   Hypertension    Peripheral neuropathy 2024   Hands and Feet   Personal history of chemotherapy    Personal history of radiation therapy    Vaginal delivery 1983   Past Surgical History:  Procedure Laterality Date   BREAST BIOPSY     BREAST BIOPSY Left 01/01/2023   [place] LT BREAST BX W LOC DEV 1ST LESION IMG BX SPEC US GUIDE 01/01/2023 [facility]   BREAST BIOPSY Left 01/01/2023   [place] LT BREAST BX W LOC DEV EA ADD LESION IMG BX SPEC US GUIDE 01/01/2023 [facility]   BREAST LUMPECTOMY Left    BREAST LUMPECTOMY WITH RADIOACTIVE SEED AND SENTINEL LYMPH NODE BIOPSY Left 04/30/2017   Procedure: LEFT BREAST LUMPECTOMY WITH RADIOACTIVE SEED AND LEFT SENTINEL LYMPH NODE BIOPSY ERAS PATHWAY;  Surgeon: Harriette Bouillon, MD;  Location: [facility];  Service: General;  Laterality: Left;   COLONOSCOPY WITH PROPOFOL N/A 05/28/2016   Procedure: COLONOSCOPY WITH PROPOFOL;  Surgeon: Charolett Bumpers, MD;  Location: [facility];  Service: Endoscopy;  Laterality: N/A;   DILATATION & CURETTAGE/HYSTEROSCOPY WITH MYOSURE N/A 04/20/2020   Procedure: DILATATION & CURETTAGE/HYSTEROSCOPY WITH MYOSURE;  Surgeon: Myna Hidalgo, DO;  Location: [facility];  Service: Gynecology;  Laterality: N/A;   DILATION AND CURETTAGE OF UTERUS     HYSTEROSCOPY WITH D & C N/A 07/21/2015   Procedure: DILATATION AND CURETTAGE /HYSTEROSCOPY with myosure;  Surgeon: Myna Hidalgo, DO;  Location: [facility];  Service: Gynecology;  Laterality: N/A;   MASTECTOMY W/ SENTINEL NODE BIOPSY Left 03/12/2023   Procedure: LEFT MASTECTOMY WITH SENTINEL LYMPH NODE BIOPSY;  Surgeon: Almond Lint, MD;  Location: [facility];  Service: General;  Laterality: Left;   PORTACATH PLACEMENT Right 04/30/2017   Procedure: INSERTION PORT-A-CATH;  Surgeon: Harriette Bouillon, MD;  Location: [facility];  Service: General;  Laterality: Right;   PORTACATH PLACEMENT N/A 03/12/2023   Procedure: PORT PLACEMENT WITH ULTRASOUND GUIDANCE;  Surgeon: Almond Lint, MD;  Location: [facility];  Service: General;  Laterality: N/A;   SIMPLE MASTECTOMY WITH AXILLARY SENTINEL NODE BIOPSY Right 03/12/2023   Procedure: RIGHT MASTECTOMY;  Surgeon: Almond Lint, MD;  Location: [facility];  Service: General;  Laterality: Right;   Patient Active Problem List   Diagnosis Date Noted   Recurrent breast cancer, left (HCC) 03/12/2023   Atrophy of vagina 09/12/2020   Bilateral lower extremity edema 09/12/2020   History of ductal carcinoma in situ of breast 09/12/2020  Hyperglycemia due to type 2 diabetes mellitus (HCC) 09/12/2020   Knee pain 09/12/2020   Long term (current) use of insulin (HCC) 09/12/2020   Lumbosacral spondylosis without myelopathy 09/12/2020   Mixed hyperlipidemia 09/12/2020   Obstructive sleep apnea syndrome 09/12/2020   Ovarian cyst 09/12/2020   Overweight 09/12/2020   Personal history of malignant neoplasm of breast 09/12/2020   Postmenopausal bleeding 09/12/2020   Pure hypercholesterolemia 09/12/2020   Sciatica 09/12/2020   Morbid obesity (HCC) 09/12/2020   Chemotherapy-induced peripheral neuropathy (HCC) 01/22/2018   Encounter for  antineoplastic chemotherapy 06/07/2017   Port-A-Cath in place 05/24/2017   Genetic testing 05/23/2017   Malignant neoplasm of upper-outer quadrant of left breast in female, estrogen receptor negative (HCC) 04/22/2017   Essential hypertension 01/30/2015   Diabetes mellitus (HCC) 01/30/2015    REFERRING PROVIDER: Dr. Donell Beers    REFERRING DIAG:  Diagnosis  C50.412,Z17.1 (ICD-10-CM) - Malignant neoplasm of upper-outer quadrant of left breast in female, estrogen receptor negative (HCC)    THERAPY DIAG:  Malignant neoplasm of upper-outer quadrant of left breast in female, estrogen receptor negative (HCC)  Status post mastectomy, bilateral  Lymphedema, not elsewhere classified  Stiffness of left shoulder, not elsewhere classified  Stiffness of right shoulder, not elsewhere classified  ONSET DATE: 12/2022  Rationale for Evaluation and Treatment: Rehabilitation  SUBJECTIVE:                                                                                                                                                                                           SUBJECTIVE STATEMENT: They took off of the Left and around 150 off the Rt. I go to see her again next week.   PERTINENT HISTORY: New/recurrent breast cancer on the left. 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 Port placement and bil mastectomy 03/12/23 with removal of 2 negative nodes. Treatment in 2018 with lumpectomy, SLNB, chemo and XRT.  1 negative node removed in 2018.   aspirarted frorm Rt and 150 from Lt 04/16/23. Seroma catheters may be needed. Will be doing chemo again.    PAIN:  Are you having pain? No  PRECAUTIONS: Lt lymphedema risk   RED FLAGS: None   WEIGHT BEARING RESTRICTIONS: No  FALLS:  Has patient fallen in last 6 months? No  LIVING ENVIRONMENT: Lives with: lives with their family and lives with their spouse  OCCUPATION: Retired   LEISURE: nothing really,  get back to water aerobics.    HAND  DOMINANCE: right   PRIOR LEVEL OF FUNCTION: Independent  PATIENT GOALS: get more movement back    OBJECTIVE:  COGNITION: Overall cognitive status: Within functional limits  for tasks assessed   PALPATION: sloshing full chest bilateral chest wall with return of seroma - note sent to Dr. Donell Beers   OBSERVATIONS / OTHER ASSESSMENTS: Left incision well healed - Rt incision has a small place covered with a band-aid that is still open and draining - white tissue present.   Lt back of hand and wrist appear larger with less visible landmarks  SENSATION: Numbness left tricep region.   POSTURE: rounded shoulders, forward head    UPPER EXTREMITY AROM/PROM:  A/PROM RIGHT   eval   Shoulder extension 50  Shoulder flexion 115 - incision  Shoulder abduction 90 - pulls in incision  Shoulder internal rotation   Shoulder external rotation 75    (Blank rows = not tested)  A/PROM LEFT   eval  Shoulder extension 50  Shoulder flexion 117 - feels cording like arm pull but not visible  Shoulder abduction 85  Shoulder internal rotation   Shoulder external rotation 75    (Blank rows = not tested)  UPPER EXTREMITY STRENGTH:   LYMPHEDEMA ASSESSMENTS:   LANDMARK RIGHT  eval  At axilla    15 cm proximal to olecranon process 39.7  10 cm proximal to olecranon process 38.7  Olecranon process 32.5  15 cm proximal to ulnar styloid process 27.3  10 cm proximal to ulnar styloid process 25.1  Just proximal to ulnar styloid process 18  Across hand at thumb web space 20.5  At base of 2nd digit 6.1  (Blank rows = not tested)  LANDMARK LEFT  eval  At axilla    15 cm proximal to olecranon process 40.5  10 cm proximal to olecranon process 40.5  Olecranon process 36.5  15 cm proximal to ulnar styloid process 29.2  10 cm proximal to ulnar styloid process 25.8  Just proximal to ulnar styloid process 18.8  Across hand at thumb web space 20.3  At base of 2nd digit 5.8  (Blank rows = not  tested) 10cm  Rt: 2981 Lt: 3395 difference  QUICK DASH SURVEY: 45%   TODAY'S TREATMENT:                                                                                                                                          DATE:  04/30/23 Made 2 large foam rectangles with 1/2" gray foam to place over seroma areas now that incision is healed and protected.  Supine wearing compression: bil shoulder PROM flexion, abduction, ER MLD short neck, bil shoulder collectors, superficial and deep abdominals with resisted breathing, Lt axilloinguinal anastamosis and then Lt UE from proximal to distal and then in reverse with education on lymphadema.   04/23/23 Eval performed Pt seems to have some baseline Lt UE lymphedema from prior treatment of her triple negative cancer or with her new onset of cancer.   Education on sleeve types to consider with pt wanting a black  color Measured pt for sigvaris secure arm sleeve size L3; large average length and size medium glove.  She will start with fabric in between flat knit and circular knit but may need to get a flat knit custom if she is not contained.   Order will be submitted to [facility] today by therapist.   Education on post op stretches to start with demo and education for each - added wall walking into flexion as an alternative for hands clasped flexion  PATIENT EDUCATION:  Education details: per today's note Person educated: Patient Education method: Chief Technology Officer Education comprehension: verbalized understanding, returned demonstration, and needs further education  HOME EXERCISE PROGRAM: Post op breast with wall flexion as an option instead of hands clasped - pt aware this may change with drain placement.    ASSESSMENT:  CLINICAL IMPRESSION: Pt continues with significant seroma pockets bil chest wall even after draining just yesterday.  Focused on increasing compression with foam and PROM to minimize active friction in the  area.   OBJECTIVE IMPAIRMENTS: decreased activity tolerance, decreased knowledge of condition, decreased knowledge of use of DME, decreased ROM, decreased strength, and pain.   ACTIVITY LIMITATIONS: carrying, lifting, and reach over head  PARTICIPATION LIMITATIONS: cleaning, laundry, and community activity  PERSONAL FACTORS: Time since onset of injury/illness/exacerbation and 3+ comorbidities: radiation hx to axilla, SLNB, disease recurrence.    are also affecting patient's functional outcome.   REHAB POTENTIAL: Good  CLINICAL DECISION MAKING: Evolving/moderate complexity  EVALUATION COMPLEXITY: Moderate  GOALS: Goals reviewed with patient? Yes  SHORT TERM GOALS: Target date: 05/07/23  Pt will be educated on lymphedema - physiology, healthy habits, importance of treatment, and treatment options  Baseline: Goal status: INITIAL   LONG TERM GOALS: Target date: 05/28/23  Pt will obtain compression sleeve and glove for use during treatment with education on use Baseline:  Goal status: INITIAL  2.  Pt will improve bil shoulder reach to [facility] to allow for reach into the cabinets Baseline:  Goal status: INITIAL  3.  Pt will be ind with self MLD for the Lt UE  Baseline:  Goal status: INITIAL  4.  Pt will decrease QDASH to 15% or less to demonstrate improved mobility in the UE Baseline:  Goal status: INITIAL  5.  Pt will be ind with final HEP Baseline:  Goal status: INITIAL   PLAN:  PT FREQUENCY: 1-2x/week  PT DURATION: other: 5 weeks   PLANNED INTERVENTIONS: Therapeutic exercises, Neuromuscular re-education, Patient/Family education, Self Care, DME instructions, Manual therapy, and Re-evaluation  PLAN FOR NEXT SESSION: education on lymphedema - principles of care, physiology, etc, risk reduction and healthy practices.  Bil shoulder AAROM/PROM as able - getting drains?,   Added: 05/01/23: no coverage at [facility], faxed order to [facility] due to [employer] [place] 05/06/23: [facility]: she would have to pay OOP but then could get reimbursed by her [employer]? Will let know if pt would like to do this.   Idamae Lusher, PT 04/30/2023, 10:46 AM

## 2023-05-07 ENCOUNTER — Ambulatory Visit: Payer: Medicare Other | Admitting: Physical Therapy

## 2023-05-07 DIAGNOSIS — C50412 Malignant neoplasm of upper-outer quadrant of left female breast: Secondary | ICD-10-CM | POA: Diagnosis not present

## 2023-05-07 DIAGNOSIS — Z171 Estrogen receptor negative status [ER-]: Secondary | ICD-10-CM

## 2023-05-07 DIAGNOSIS — M25611 Stiffness of right shoulder, not elsewhere classified: Secondary | ICD-10-CM

## 2023-05-07 DIAGNOSIS — M25612 Stiffness of left shoulder, not elsewhere classified: Secondary | ICD-10-CM

## 2023-05-07 DIAGNOSIS — I890 Lymphedema, not elsewhere classified: Secondary | ICD-10-CM

## 2023-05-07 DIAGNOSIS — Z9013 Acquired absence of bilateral breasts and nipples: Secondary | ICD-10-CM | POA: Diagnosis not present

## 2023-05-07 NOTE — Therapy (Signed)
OUTPATIENT PHYSICAL THERAPY  UPPER EXTREMITY ONCOLOGY TREATMENT  Patient Name: Audrey Peters MRN: [id number] DOB:02/12/1956, 67 y.o., female Today's Date: 05/07/2023  END OF SESSION:  PT End of Session - 05/07/23 1008     Visit Number 3    Number of Visits 5    Date for PT Re-Evaluation 05/28/23    PT Start Time 0915    PT Stop Time 1000    PT Time Calculation (min) 45 min    Activity Tolerance Patient tolerated treatment well    Behavior During Therapy WFL for tasks assessed/performed              Past Medical History:  Diagnosis Date   Anemia yrs ago   Arthritis    Breast cancer (HCC)    Cancer (HCC)    recent dx in breast   Carpal tunnel syndrome of right wrist    Diabetes mellitus without complication (HCC)    dx 2008   Headache    sinus   Hypertension    Peripheral neuropathy 2024   Hands and Feet   Personal history of chemotherapy    Personal history of radiation therapy    Vaginal delivery 1983   Past Surgical History:  Procedure Laterality Date   BREAST BIOPSY     BREAST BIOPSY Left 01/01/2023   [place] LT BREAST BX W LOC DEV 1ST LESION IMG BX SPEC US GUIDE 01/01/2023 [facility]   BREAST BIOPSY Left 01/01/2023   [place] LT BREAST BX W LOC DEV EA ADD LESION IMG BX SPEC US GUIDE 01/01/2023 [facility]   BREAST LUMPECTOMY Left    BREAST LUMPECTOMY WITH RADIOACTIVE SEED AND SENTINEL LYMPH NODE BIOPSY Left 04/30/2017   Procedure: LEFT BREAST LUMPECTOMY WITH RADIOACTIVE SEED AND LEFT SENTINEL LYMPH NODE BIOPSY ERAS PATHWAY;  Surgeon: Harriette Bouillon, MD;  Location: [facility];  Service: General;  Laterality: Left;   COLONOSCOPY WITH PROPOFOL N/A 05/28/2016   Procedure: COLONOSCOPY WITH PROPOFOL;  Surgeon: Charolett Bumpers, MD;  Location: [facility];  Service: Endoscopy;  Laterality: N/A;   DILATATION & CURETTAGE/HYSTEROSCOPY WITH MYOSURE N/A 04/20/2020   Procedure: DILATATION & CURETTAGE/HYSTEROSCOPY WITH MYOSURE;  Surgeon: Myna Hidalgo, DO;  Location: [facility];  Service: Gynecology;  Laterality: N/A;   DILATION AND CURETTAGE OF UTERUS     HYSTEROSCOPY WITH D & C N/A 07/21/2015   Procedure: DILATATION AND CURETTAGE /HYSTEROSCOPY with myosure;  Surgeon: Myna Hidalgo, DO;  Location: [facility];  Service: Gynecology;  Laterality: N/A;   MASTECTOMY W/ SENTINEL NODE BIOPSY Left 03/12/2023   Procedure: LEFT MASTECTOMY WITH SENTINEL LYMPH NODE BIOPSY;  Surgeon: Almond Lint, MD;  Location: [facility];  Service: General;  Laterality: Left;   PORTACATH PLACEMENT Right 04/30/2017   Procedure: INSERTION PORT-A-CATH;  Surgeon: Harriette Bouillon, MD;  Location: [facility];  Service: General;  Laterality: Right;   PORTACATH PLACEMENT N/A 03/12/2023   Procedure: PORT PLACEMENT WITH ULTRASOUND GUIDANCE;  Surgeon: Almond Lint, MD;  Location: [facility];  Service: General;  Laterality: N/A;   SIMPLE MASTECTOMY WITH AXILLARY SENTINEL NODE BIOPSY Right 03/12/2023   Procedure: RIGHT MASTECTOMY;  Surgeon: Almond Lint, MD;  Location: [facility];  Service: General;  Laterality: Right;   Patient Active Problem List   Diagnosis Date Noted   Recurrent breast cancer, left (HCC) 03/12/2023   Atrophy of vagina 09/12/2020   Bilateral lower extremity edema 09/12/2020   History of ductal carcinoma in situ of breast 09/12/2020  Hyperglycemia due to type 2 diabetes mellitus (HCC) 09/12/2020   Knee pain 09/12/2020   Long term (current) use of insulin (HCC) 09/12/2020   Lumbosacral spondylosis without myelopathy 09/12/2020   Mixed hyperlipidemia 09/12/2020   Obstructive sleep apnea syndrome 09/12/2020   Ovarian cyst 09/12/2020   Overweight 09/12/2020   Personal history of malignant neoplasm of breast 09/12/2020   Postmenopausal bleeding 09/12/2020   Pure hypercholesterolemia 09/12/2020   Sciatica 09/12/2020   Morbid obesity (HCC) 09/12/2020   Chemotherapy-induced peripheral neuropathy (HCC) 01/22/2018   Encounter for  antineoplastic chemotherapy 06/07/2017   Port-A-Cath in place 05/24/2017   Genetic testing 05/23/2017   Malignant neoplasm of upper-outer quadrant of left breast in female, estrogen receptor negative (HCC) 04/22/2017   Essential hypertension 01/30/2015   Diabetes mellitus (HCC) 01/30/2015    REFERRING PROVIDER: Dr. Donell Beers    REFERRING DIAG:  Diagnosis  C50.412,Z17.1 (ICD-10-CM) - Malignant neoplasm of upper-outer quadrant of left breast in female, estrogen receptor negative (HCC)    THERAPY DIAG:  Malignant neoplasm of upper-outer quadrant of left breast in female, estrogen receptor negative (HCC)  Lymphedema, not elsewhere classified  Stiffness of left shoulder, not elsewhere classified  Stiffness of right shoulder, not elsewhere classified  ONSET DATE: 12/2022  Rationale for Evaluation and Treatment: Rehabilitation  SUBJECTIVE:                                                                                                                                                                                           SUBJECTIVE STATEMENT: Pt feels like her fluid is still about the same. She goes to see Dr. Donell Beers today.  She feels like she is able to use her arms a bit better and can reach into the lower cabinets.  She is doing her exercises daily. She has been wearing the foam patches under her compression bra, but feels like they ride up .  PERTINENT HISTORY: New/recurrent breast cancer on the left. 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 Port placement and bil mastectomy 03/12/23 with removal of 2 negative nodes. Treatment in 2018 with lumpectomy, SLNB, chemo and XRT.  1 negative node removed in 2018.   aspirarted frorm Rt and 150 from Lt 04/16/23. Seroma catheters may be needed. Will be doing chemo again.    PAIN:  Are you having pain? Yes.  She feels that it is about a 7 across her chest. The pain is worse when she lies down and moves, but is better when she sits still.    PRECAUTIONS: Lt lymphedema risk   RED FLAGS: None   WEIGHT BEARING RESTRICTIONS: No  FALLS:  Has patient  fallen in last 6 months? No  LIVING ENVIRONMENT: Lives with: lives with their family and lives with their spouse  OCCUPATION: Retired   LEISURE: nothing really,  get back to water aerobics.    HAND DOMINANCE: right   PRIOR LEVEL OF FUNCTION: Independent  PATIENT GOALS: get more movement back    OBJECTIVE:  COGNITION: Overall cognitive status: Within functional limits for tasks assessed   PALPATION: sloshing full chest bilateral chest wall with return of seroma - note sent to Dr. Donell Beers   OBSERVATIONS / OTHER ASSESSMENTS: Left incision well healed - Rt incision has a small place covered with a band-aid that is still open and draining - white tissue present.   Lt back of hand and wrist appear larger with less visible landmarks  SENSATION: Numbness left tricep region.   POSTURE: rounded shoulders, forward head    UPPER EXTREMITY AROM/PROM:  A/PROM RIGHT   eval  RIGHT  05/07/2023  Shoulder extension 50   Shoulder flexion 115 - incision 168  Shoulder abduction 90 - pulls in incision 157 no pulling   Shoulder internal rotation    Shoulder external rotation 75     (Blank rows = not tested)  A/PROM LEFT   eval LEFT 05/07/2023  Shoulder extension 50   Shoulder flexion 117 - feels cording like arm pull but not visible 152 ( no pulling)   Shoulder abduction 85 148  Shoulder internal rotation    Shoulder external rotation 75     (Blank rows = not tested)  UPPER EXTREMITY STRENGTH:   LYMPHEDEMA ASSESSMENTS:   LANDMARK RIGHT  eval  At axilla    15 cm proximal to olecranon process 39.7  10 cm proximal to olecranon process 38.7  Olecranon process 32.5  15 cm proximal to ulnar styloid process 27.3  10 cm proximal to ulnar styloid process 25.1  Just proximal to ulnar styloid process 18  Across hand at thumb web space 20.5  At base of 2nd digit 6.1   (Blank rows = not tested)  LANDMARK LEFT  eval  At axilla    15 cm proximal to olecranon process 40.5  10 cm proximal to olecranon process 40.5  Olecranon process 36.5  15 cm proximal to ulnar styloid process 29.2  10 cm proximal to ulnar styloid process 25.8  Just proximal to ulnar styloid process 18.8  Across hand at thumb web space 20.3  At base of 2nd digit 5.8  (Blank rows = not tested) 10cm  Rt: 2981 Lt: 3395 difference  QUICK DASH SURVEY: 45%   TODAY'S TREATMENT:                                                                                                                                          DATE:  05/07/2023 Adjusted foam to fit inside front pockets on each side to hopefully help keep foam in place  at fullness on chest. "Sloshing" still evident today especailly on left side.  Began with cervical, upper thoracis and UE ROM in sitting.  Pt able to do 5 sit to stands with no arm push up. Encouraged pt to continue with this at home. To supine for supine dowel exercise for bilateral shoulder flexion, manual PROM with gentle strech , MLD to chest and arm, same performed to each side. Then to sidelying for more MLD to back and lateral chest on each side.  Remeasured arm ROM with significant imrpovment since eval    04/30/23 Made 2 large foam rectangles with 1/2" gray foam to place over seroma areas now that incision is healed and protected.  Supine wearing compression: bil shoulder PROM flexion, abduction, ER MLD short neck, bil shoulder collectors, superficial and deep abdominals with resisted breathing, Lt axilloinguinal anastamosis and then Lt UE from proximal to distal and then in reverse with education on lymphadema.   04/23/23 Eval performed Pt seems to have some baseline Lt UE lymphedema from prior treatment of her triple negative cancer or with her new onset of cancer.   Education on sleeve types to consider with pt wanting a black color Measured pt for  sigvaris secure arm sleeve size L3; large average length and size medium glove.  She will start with fabric in between flat knit and circular knit but may need to get a flat knit custom if she is not contained.   Order will be submitted to [facility] today by therapist.   Education on post op stretches to start with demo and education for each - added wall walking into flexion as an alternative for hands clasped flexion  PATIENT EDUCATION:  Education details: per today's note Person educated: Patient Education method: Chief Technology Officer Education comprehension: verbalized understanding, returned demonstration, and needs further education  HOME EXERCISE PROGRAM: Post op breast with wall flexion as an option instead of hands clasped - pt aware this may change with drain placement.    ASSESSMENT:  CLINICAL IMPRESSION: Pt continues with significant seroma pockets bil chest wall even after draining just yesterday.  Focused on increasing compression with foam and PROM to minimize active friction in the area.   OBJECTIVE IMPAIRMENTS: decreased activity tolerance, decreased knowledge of condition, decreased knowledge of use of DME, decreased ROM, decreased strength, and pain.   ACTIVITY LIMITATIONS: carrying, lifting, and reach over head  PARTICIPATION LIMITATIONS: cleaning, laundry, and community activity  PERSONAL FACTORS: Time since onset of injury/illness/exacerbation and 3+ comorbidities: radiation hx to axilla, SLNB, disease recurrence.    are also affecting patient's functional outcome.   REHAB POTENTIAL: Good  CLINICAL DECISION MAKING: Evolving/moderate complexity  EVALUATION COMPLEXITY: Moderate  GOALS: Goals reviewed with patient? Yes  SHORT TERM GOALS: Target date: 05/07/23  Pt will be educated on lymphedema - physiology, healthy habits, importance of treatment, and treatment options  Baseline: Goal status: INITIAL   LONG TERM GOALS: Target date: 05/28/23  Pt will  obtain compression sleeve and glove for use during treatment with education on use Baseline:  Goal status: INITIAL  2.  Pt will improve bil shoulder reach to [facility] to allow for reach into the cabinets Baseline:  Goal status: INITIAL  3.  Pt will be ind with self MLD for the Lt UE  Baseline:  Goal status: INITIAL  4.  Pt will decrease QDASH to 15% or less to demonstrate improved mobility in the UE Baseline:  Goal status: INITIAL  5.  Pt will be ind with  final HEP Baseline:  Goal status: INITIAL   PLAN:  PT FREQUENCY: 1-2x/week  PT DURATION: other: 5 weeks   PLANNED INTERVENTIONS: Therapeutic exercises, Neuromuscular re-education, Patient/Family education, Self Care, DME instructions, Manual therapy, and Re-evaluation  PLAN FOR NEXT SESSION: education on lymphedema - principles of care, physiology, etc, risk reduction and healthy practices.  Bil shoulder AAROM/PROM as able - getting drains?,   Added: 05/01/23: no coverage at [facility], faxed order to [facility] due to [employer] [place] 05/06/23: from [facility]: she would have to pay OOP but then could get reimbursed by her [employer]? Will let know if pt would like to do this. Added 05/07/2923: continue with MLD for edema management, progress exercises to strengthening and continue with ROM, reinforce home program    Bayard Hugger. Manson Passey PT  Donnetta Hail, PT 05/07/2023, 10:09 AM [facility] [address] [place], [place], [address] Phone: [phone]   Fax:  [phone]  Patient Details  Name: Audrey Peters MRN: [id number] Date of Birth: [birth year] Referring Provider:  Almond Lint, MD  Encounter Date: 05/07/2023

## 2023-05-08 ENCOUNTER — Telehealth: Payer: Self-pay | Admitting: *Deleted

## 2023-05-08 DIAGNOSIS — C50412 Malignant neoplasm of upper-outer quadrant of left female breast: Secondary | ICD-10-CM | POA: Diagnosis not present

## 2023-05-08 MED FILL — Dexamethasone Sodium Phosphate Inj 100 MG/10ML: INTRAMUSCULAR | Qty: 1 | Status: AC

## 2023-05-08 NOTE — Telephone Encounter (Signed)
Received VM from pt.  RN attempt x1 to return call.  No answer, unable to LVM due to VM being full.

## 2023-05-09 ENCOUNTER — Inpatient Hospital Stay: Payer: Medicare Other

## 2023-05-09 ENCOUNTER — Inpatient Hospital Stay (HOSPITAL_BASED_OUTPATIENT_CLINIC_OR_DEPARTMENT_OTHER): Payer: Medicare Other | Admitting: Hematology and Oncology

## 2023-05-09 ENCOUNTER — Inpatient Hospital Stay: Payer: Medicare Other | Attending: Hematology and Oncology

## 2023-05-09 VITALS — BP 127/65 | HR 93 | Temp 97.7°F | Resp 18 | Ht 64.0 in | Wt 225.0 lb

## 2023-05-09 VITALS — BP 110/68 | HR 76 | Resp 16

## 2023-05-09 DIAGNOSIS — C50412 Malignant neoplasm of upper-outer quadrant of left female breast: Secondary | ICD-10-CM

## 2023-05-09 DIAGNOSIS — Z5111 Encounter for antineoplastic chemotherapy: Secondary | ICD-10-CM | POA: Diagnosis not present

## 2023-05-09 DIAGNOSIS — Z171 Estrogen receptor negative status [ER-]: Secondary | ICD-10-CM

## 2023-05-09 DIAGNOSIS — Z95828 Presence of other vascular implants and grafts: Secondary | ICD-10-CM

## 2023-05-09 LAB — CMP (CANCER CENTER ONLY)
ALT: 32 U/L (ref 0–44)
AST: 48 U/L — ABNORMAL HIGH (ref 15–41)
Albumin: 3.7 g/dL (ref 3.5–5.0)
Alkaline Phosphatase: 73 U/L (ref 38–126)
Anion gap: 9 (ref 5–15)
BUN: 28 mg/dL — ABNORMAL HIGH (ref 8–23)
CO2: 25 mmol/L (ref 22–32)
Calcium: 8.8 mg/dL — ABNORMAL LOW (ref 8.9–10.3)
Chloride: 106 mmol/L (ref 98–111)
Creatinine: 1.03 mg/dL — ABNORMAL HIGH (ref 0.44–1.00)
GFR, Estimated: 60 mL/min — ABNORMAL LOW (ref >60)
Glucose, Bld: 223 mg/dL — ABNORMAL HIGH (ref 70–99)
Potassium: 3.9 mmol/L (ref 3.5–5.1)
Sodium: 140 mmol/L (ref 135–145)
Total Bilirubin: 0.4 mg/dL (ref 0.3–1.2)
Total Protein: 6.9 g/dL (ref 6.5–8.1)

## 2023-05-09 LAB — CBC WITH DIFFERENTIAL (CANCER CENTER ONLY)
Abs Immature Granulocytes: 0.02 10*3/uL (ref 0.00–0.07)
Basophils Absolute: 0 10*3/uL (ref 0.0–0.1)
Basophils Relative: 0 %
Eosinophils Absolute: 0.1 10*3/uL (ref 0.0–0.5)
Eosinophils Relative: 3 %
HCT: 36.6 % (ref 36.0–46.0)
Hemoglobin: 11.3 g/dL — ABNORMAL LOW (ref 12.0–15.0)
Immature Granulocytes: 0 %
Lymphocytes Relative: 42 %
Lymphs Abs: 2.2 10*3/uL (ref 0.7–4.0)
MCH: 28 pg (ref 26.0–34.0)
MCHC: 30.9 g/dL (ref 30.0–36.0)
MCV: 90.6 fL (ref 80.0–100.0)
Monocytes Absolute: 0.4 10*3/uL (ref 0.1–1.0)
Monocytes Relative: 8 %
Neutro Abs: 2.4 10*3/uL (ref 1.7–7.7)
Neutrophils Relative %: 47 %
Platelet Count: 158 10*3/uL (ref 150–400)
RBC: 4.04 MIL/uL (ref 3.87–5.11)
RDW: 13.4 % (ref 11.5–15.5)
WBC Count: 5.2 10*3/uL (ref 4.0–10.5)
nRBC: 0 % (ref 0.0–0.2)

## 2023-05-09 MED ORDER — SODIUM CHLORIDE 0.9% FLUSH
10.0000 mL | INTRAVENOUS | Status: DC | PRN
  Administered 2023-05-09: 10 mL

## 2023-05-09 MED ORDER — SODIUM CHLORIDE 0.9 % IV SOLN: Freq: Once | INTRAVENOUS | Status: AC

## 2023-05-09 MED ORDER — HEPARIN SOD (PORK) LOCK FLUSH 100 UNIT/ML IV SOLN
500.0000 [IU] | Freq: Once | INTRAVENOUS | Status: AC | PRN
  Administered 2023-05-09: 500 [IU]

## 2023-05-09 MED ORDER — SODIUM CHLORIDE 0.9 % IV SOLN
600.0000 mg/m2 | Freq: Once | INTRAVENOUS | Status: AC
  Administered 2023-05-09: 1260 mg via INTRAVENOUS
  Filled 2023-05-09: qty 63

## 2023-05-09 MED ORDER — PALONOSETRON HCL INJECTION 0.25 MG/5ML
0.2500 mg | Freq: Once | INTRAVENOUS | Status: AC
  Administered 2023-05-09: 0.25 mg via INTRAVENOUS
  Filled 2023-05-09: qty 5

## 2023-05-09 MED ORDER — FLUOROURACIL CHEMO INJECTION 2.5 GM/50ML
600.0000 mg/m2 | Freq: Once | INTRAVENOUS | Status: AC
  Administered 2023-05-09: 1250 mg via INTRAVENOUS
  Filled 2023-05-09: qty 25

## 2023-05-09 MED ORDER — METHOTREXATE SODIUM CHEMO INJECTION (PF) 50 MG/2ML
40.0000 mg/m2 | Freq: Once | INTRAMUSCULAR | Status: AC
  Administered 2023-05-09: 83.5 mg via INTRAVENOUS
  Filled 2023-05-09: qty 3.34

## 2023-05-09 MED ORDER — SODIUM CHLORIDE 0.9% FLUSH
10.0000 mL | INTRAVENOUS | Status: DC | PRN
  Administered 2023-05-09: 10 mL via INTRAVENOUS

## 2023-05-09 MED ORDER — SODIUM CHLORIDE 0.9 % IV SOLN
10.0000 mg | Freq: Once | INTRAVENOUS | Status: AC
  Administered 2023-05-09: 10 mg via INTRAVENOUS
  Filled 2023-05-09: qty 10

## 2023-05-09 NOTE — Patient Instructions (Signed)
[facility]  Discharge Instructions: Thank you for choosing [facility] to provide your oncology and hematology care.   If you have a lab appointment with the Cancer Center, please go directly to the Cancer Center and check in at the registration area.   Wear comfortable clothing and clothing appropriate for easy access to any Portacath or PICC line.   We strive to give you quality time with your provider. You may need to reschedule your appointment if you arrive late (15 or more minutes).  Arriving late affects you and other patients whose appointments are after yours.  Also, if you miss three or more appointments without notifying the office, you may be dismissed from the clinic at the provider's discretion.      For prescription refill requests, have your pharmacy contact our office and allow 72 hours for refills to be completed.    Today you received the following chemotherapy and/or immunotherapy agents Cytoxan, Methotrexate, Flurouracil      To help prevent nausea and vomiting after your treatment, we encourage you to take your nausea medication as directed.  BELOW ARE SYMPTOMS THAT SHOULD BE REPORTED IMMEDIATELY: *FEVER GREATER THAN 100.4 F (38 C) OR HIGHER *CHILLS OR SWEATING *NAUSEA AND VOMITING THAT IS NOT CONTROLLED WITH YOUR NAUSEA MEDICATION *UNUSUAL SHORTNESS OF BREATH *UNUSUAL BRUISING OR BLEEDING *URINARY PROBLEMS (pain or burning when urinating, or frequent urination) *BOWEL PROBLEMS (unusual diarrhea, constipation, pain near the anus) TENDERNESS IN MOUTH AND THROAT WITH OR WITHOUT PRESENCE OF ULCERS (sore throat, sores in mouth, or a toothache) UNUSUAL RASH, SWELLING OR PAIN  UNUSUAL VAGINAL DISCHARGE OR ITCHING   Items with * indicate a potential emergency and should be followed up as soon as possible or go to the Emergency Department if any problems should occur.  Please show the CHEMOTHERAPY ALERT CARD or  IMMUNOTHERAPY ALERT CARD at check-in to the Emergency Department and triage nurse.  Should you have questions after your visit or need to cancel or reschedule your appointment, please contact [facility]  Dept: [phone]  and follow the prompts.  Office hours are 8:00 a.m. to 4:30 p.m. Monday - Friday. Please note that voicemails left after 4:00 p.m. may not be returned until the following business day.  We are closed weekends and major holidays. You have access to a nurse at all times for urgent questions. Please call the main number to the clinic Dept: [phone] and follow the prompts.   For any non-urgent questions, you may also contact your provider using MyChart. We now offer e-Visits for anyone 70 and older to request care online for non-urgent symptoms. For details visit [email or web address].   Also download the MyChart app! Go to the app store, search "MyChart", open the app, select [facility], and log in with your MyChart username and password.  Cyclophosphamide Injection What is this medication? CYCLOPHOSPHAMIDE (sye kloe FOSS fa mide) treats some types of cancer. It works by slowing down the growth of cancer cells. This medicine may be used for other purposes; ask your health care provider or pharmacist if you have questions. COMMON BRAND NAME(S): Cyclophosphamide, Cytoxan, Neosar What should I tell my care team before I take this medication? They need to know if you have any of these conditions: Heart disease Irregular heartbeat or rhythm Infection Kidney problems Liver disease Low blood cell levels (white cells, platelets, or red blood cells) Lung disease Previous radiation Trouble passing urine  An unusual or allergic reaction to cyclophosphamide, other medications, foods, dyes, or preservatives Pregnant or trying to get pregnant Breast-feeding How should I use this medication? This medication is injected into a vein. It is  given by your care team in a hospital or clinic setting. Talk to your care team about the use of this medication in children. Special care may be needed. Overdosage: If you think you have taken too much of this medicine contact a poison control center or emergency room at once. NOTE: This medicine is only for you. Do not share this medicine with others. What if I miss a dose? Keep appointments for follow-up doses. It is important not to miss your dose. Call your care team if you are unable to keep an appointment. What may interact with this medication? Amphotericin B Amiodarone Azathioprine Certain antivirals for HIV or hepatitis Certain medications for blood pressure, such as enalapril, lisinopril, quinapril Cyclosporine Diuretics Etanercept Indomethacin Medications that relax muscles Metronidazole Natalizumab Tamoxifen Warfarin This list may not describe all possible interactions. Give your health care provider a list of all the medicines, herbs, non-prescription drugs, or dietary supplements you use. Also tell them if you smoke, drink alcohol, or use illegal drugs. Some items may interact with your medicine. What should I watch for while using this medication? This medication may make you feel generally unwell. This is not uncommon as chemotherapy can affect healthy cells as well as cancer cells. Report any side effects. Continue your course of treatment even though you feel ill unless your care team tells you to stop. You may need blood work while you are taking this medication. This medication may increase your risk of getting an infection. Call your care team for advice if you get a fever, chills, sore throat, or other symptoms of a cold or flu. Do not treat yourself. Try to avoid being around people who are sick. Avoid taking medications that contain aspirin, acetaminophen, ibuprofen, naproxen, or ketoprofen unless instructed by your care team. These medications may hide a fever. Be  careful brushing or flossing your teeth or using a toothpick because you may get an infection or bleed more easily. If you have any dental work done, tell your dentist you are receiving this medication. Drink water or other fluids as directed. Urinate often, even at night. Some products may contain alcohol. Ask your care team if this medication contains alcohol. Be sure to tell all care teams you are taking this medicine. Certain medicines, like metronidazole and disulfiram, can cause an unpleasant reaction when taken with alcohol. The reaction includes flushing, headache, nausea, vomiting, sweating, and increased thirst. The reaction can last from 30 minutes to several hours. Talk to your care team if you wish to become pregnant or think you might be pregnant. This medication can cause serious birth defects if taken during pregnancy and for 1 year after the last dose. A negative pregnancy test is required before starting this medication. A reliable form of contraception is recommended while taking this medication and for 1 year after the last dose. Talk to your care team about reliable forms of contraception. Do not father a child while taking this medication and for 4 months after the last dose. Use a condom during this time period. Do not breast-feed while taking this medication or for 1 week after the last dose. This medication may cause infertility. Talk to your care team if you are concerned about your fertility. Talk to your care team about your risk of  cancer. You may be more at risk for certain types of cancer if you take this medication. What side effects may I notice from receiving this medication? Side effects that you should report to your care team as soon as possible: Allergic reactions--skin rash, itching, hives, swelling of the face, lips, tongue, or throat Dry cough, shortness of breath or trouble breathing Heart failure--shortness of breath, swelling of the ankles, feet, or hands,  sudden weight gain, unusual weakness or fatigue Heart muscle inflammation--unusual weakness or fatigue, shortness of breath, chest pain, fast or irregular heartbeat, dizziness, swelling of the ankles, feet, or hands Heart rhythm changes--fast or irregular heartbeat, dizziness, feeling faint or lightheaded, chest pain, trouble breathing Infection--fever, chills, cough, sore throat, wounds that don't heal, pain or trouble when passing urine, general feeling of discomfort or being unwell Kidney injury--decrease in the amount of urine, swelling of the ankles, hands, or feet Liver injury--right upper belly pain, loss of appetite, nausea, light-colored stool, dark yellow or brown urine, yellowing skin or eyes, unusual weakness or fatigue Low red blood cell level--unusual weakness or fatigue, dizziness, headache, trouble breathing Low sodium level--muscle weakness, fatigue, dizziness, headache, confusion Red or dark brown urine Unusual bruising or bleeding Side effects that usually do not require medical attention (report to your care team if they continue or are bothersome): Hair loss Irregular menstrual cycles or spotting Loss of appetite Nausea Pain, redness, or swelling with sores inside the mouth or throat Vomiting This list may not describe all possible side effects. Call your doctor for medical advice about side effects. You may report side effects to FDA at 1-800-FDA-1088. Where should I keep my medication? This medication is given in a hospital or clinic. It will not be stored at home. NOTE: This sheet is a summary. It may not cover all possible information. If you have questions about this medicine, talk to your doctor, pharmacist, or health care provider.  2024 Elsevier/Gold Standard (2021-12-15 00:00:00) Methotrexate Injection What is this medication? METHOTREXATE (METH oh TREX ate) treats inflammatory conditions such as arthritis and psoriasis. It works by decreasing inflammation,  which can reduce pain and prevent long-term injury to the joints and skin. It may also be used to treat some types of cancer. It works by slowing down the growth of cancer cells. This medicine may be used for other purposes; ask your health care provider or pharmacist if you have questions. What should I tell my care team before I take this medication? They need to know if you have any of these conditions: Fluid in the stomach area or lungs Frequently drink alcohol Infection or immune system problems Kidney disease Liver disease Low blood counts (white cells, platelets, or red blood cells) Lung disease Recent or ongoing radiation Recent or upcoming vaccine Stomach ulcers Ulcerative colitis An unusual or allergic reaction to methotrexate, other medications, foods, dyes, or preservatives Pregnant or trying to get pregnant Breastfeeding How should I use this medication? This medication is for infusion into a vein or for injection into muscle or into the spinal fluid (whichever applies). It is usually given in a hospital or clinic setting. In rare cases, you might get this medication at home. You will be taught how to give this medication. Use exactly as directed. Take your medication at regular intervals. Do not take your medication more often than directed. If this medication is used for arthritis or psoriasis, it should be taken weekly, NOT daily. It is important that you put your used needles and syringes  in a special sharps container. Do not put them in a trash can. If you do not have a sharps container, call your pharmacist or care team to get one. Talk to your care team about the use of this medication in children. While this medication may be prescribed for children as young as 2 years for selected conditions, precautions do apply. Overdosage: If you think you have taken too much of this medicine contact a poison control center or emergency room at once. NOTE: This medicine is only for  you. Do not share this medicine with others. What if I miss a dose? It is important not to miss your dose. Call your care team if you are unable to keep an appointment. If you give yourself the medication, and you miss a dose, talk with your care team. Do not take double or extra doses. What may interact with this medication? Do not take this medication with any of the following: Acitretin Probenecid This medication may also interact with the following: Aspirin or aspirin-like medications Azathioprine Certain antibiotics, such as gentamicin, penicillin, tetracycline, vancomycin Certain medications that treat or prevent blood clots, such as warfarin, apixaban, dabigatran, rivaroxaban Certain medications for stomach problems, such as esomeprazole, omeprazole, pantoprazole Dapsone Hydroxychloroquine Live virus vaccines Medications for viral infections, such as acyclovir, cidofovir, foscarnet, ganciclovir Mercaptopurine NSAIDs, medications for pain and inflammation, such as ibuprofen or naproxen Phenytoin Pyrimethamine Retinoids, such as isotretinoin or tretinoin Sulfonamides, such as sulfasalazine or trimethoprim; sulfamethoxazole Theophylline This list may not describe all possible interactions. Give your health care provider a list of all the medicines, herbs, non-prescription drugs, or dietary supplements you use. Also tell them if you smoke, drink alcohol, or use illegal drugs. Some items may interact with your medicine. What should I watch for while using this medication? This medication may make you feel generally unwell. This is not uncommon as chemotherapy can affect healthy cells as well as cancer cells. Report any side effects. Continue your course of treatment even though you feel ill unless your care team tells you to stop. Your condition will be monitored carefully while you are receiving this medication. Avoid alcoholic drinks. This medication can cause serious side effects.  To reduce the risk, your care team may give you other medications to take before receiving this one. Be sure to follow the directions from your care team. This medication can make you more sensitive to the sun. Keep out of the sun. If you cannot avoid being in the sun, wear protective clothing and use sunscreen. Do not use sun lamps or tanning beds/booths. You may get drowsy or dizzy. Do not drive, use machinery, or do anything that needs mental alertness until you know how this medication affects you. Do not stand or sit up quickly, especially if you are an older patient. This reduces the risk of dizzy or fainting spells. You may need blood work while you are taking this medication. Call your care team for advice if you get a fever, chills or sore throat, or other symptoms of a cold or flu. Do not treat yourself. This medication decreases your body's ability to fight infections. Try to avoid being around people who are sick. This medication may increase your risk to bruise or bleed. Call your care team if you notice any unusual bleeding. Be careful brushing or flossing your teeth or using a toothpick because you may get an infection or bleed more easily. If you have any dental work done, tell your dentist you are  receiving this medication Check with your care team if you get an attack of severe diarrhea, nausea and vomiting, or if you sweat a lot. The loss of too much body fluid can make it dangerous for you to take this medication. Talk to your care team about your risk of cancer. You may be more at risk for certain types of cancers if you take this medication. Do not become pregnant while taking this medication or for 6 months after stopping it. Women should inform their care team if they wish to become pregnant or think they might be pregnant. Men should not father a child while taking this medication and for 3 months after stopping it. There is potential for serious harm to an unborn child. Talk to  your care team for more information. Do not breast-feed an infant while taking this medication or for 1 week after stopping it. This medication may make it more difficult to get pregnant or father a child. Talk to your care team if you are concerned about your fertility. What side effects may I notice from receiving this medication? Side effects that you should report to your care team as soon as possible: Allergic reactions--skin rash, itching, hives, swelling of the face, lips, tongue, or throat Blood clot--pain, swelling, or warmth in the leg, shortness of breath, chest pain Dry cough, shortness of breath or trouble breathing Infection--fever, chills, cough, sore throat, wounds that don't heal, pain or trouble when passing urine, general feeling of discomfort or being unwell Kidney injury--decrease in the amount of urine, swelling of the ankles, hands, or feet Liver injury--right upper belly pain, loss of appetite, nausea, light-colored stool, dark yellow or brown urine, yellowing of the skin or eyes, unusual weakness or fatigue Low red blood cell count--unusual weakness or fatigue, dizziness, headache, trouble breathing Redness, blistering, peeling, or loosening of the skin, including inside the mouth Seizures Unusual bruising or bleeding Side effects that usually do not require medical attention (report to your care team if they continue or are bothersome): Diarrhea Dizziness Hair loss Nausea Pain, redness, or swelling with sores inside the mouth or throat Vomiting This list may not describe all possible side effects. Call your doctor for medical advice about side effects. You may report side effects to FDA at 1-800-FDA-1088. Where should I keep my medication? This medication is given in a hospital or clinic. It will not be stored at home. NOTE: This sheet is a summary. It may not cover all possible information. If you have questions about this medicine, talk to your doctor, pharmacist,  or health care provider.  2024 Elsevier/Gold Standard (2023-01-02 00:00:00)  Fluorouracil Injection What is this medication? FLUOROURACIL (flure oh YOOR a sil) treats some types of cancer. It works by slowing down the growth of cancer cells. This medicine may be used for other purposes; ask your health care provider or pharmacist if you have questions. COMMON BRAND NAME(S): Adrucil What should I tell my care team before I take this medication? They need to know if you have any of these conditions: Blood disorders Dihydropyrimidine dehydrogenase (DPD) deficiency Infection, such as chickenpox, cold sores, herpes Kidney disease Liver disease Poor nutrition Recent or ongoing radiation therapy An unusual or allergic reaction to fluorouracil, other medications, foods, dyes, or preservatives If you or your partner are pregnant or trying to get pregnant Breast-feeding How should I use this medication? This medication is injected into a vein. It is administered by your care team in a hospital or clinic setting.  Talk to your care team about the use of this medication in children. Special care may be needed. Overdosage: If you think you have taken too much of this medicine contact a poison control center or emergency room at once. NOTE: This medicine is only for you. Do not share this medicine with others. What if I miss a dose? Keep appointments for follow-up doses. It is important not to miss your dose. Call your care team if you are unable to keep an appointment. What may interact with this medication? Do not take this medication with any of the following: Live virus vaccines This medication may also interact with the following: Medications that treat or prevent blood clots, such as warfarin, enoxaparin, dalteparin This list may not describe all possible interactions. Give your health care provider a list of all the medicines, herbs, non-prescription drugs, or dietary supplements you use.  Also tell them if you smoke, drink alcohol, or use illegal drugs. Some items may interact with your medicine. What should I watch for while using this medication? Your condition will be monitored carefully while you are receiving this medication. This medication may make you feel generally unwell. This is not uncommon as chemotherapy can affect healthy cells as well as cancer cells. Report any side effects. Continue your course of treatment even though you feel ill unless your care team tells you to stop. In some cases, you may be given additional medications to help with side effects. Follow all directions for their use. This medication may increase your risk of getting an infection. Call your care team for advice if you get a fever, chills, sore throat, or other symptoms of a cold or flu. Do not treat yourself. Try to avoid being around people who are sick. This medication may increase your risk to bruise or bleed. Call your care team if you notice any unusual bleeding. Be careful brushing or flossing your teeth or using a toothpick because you may get an infection or bleed more easily. If you have any dental work done, tell your dentist you are receiving this medication. Avoid taking medications that contain aspirin, acetaminophen, ibuprofen, naproxen, or ketoprofen unless instructed by your care team. These medications may hide a fever. Do not treat diarrhea with over the counter products. Contact your care team if you have diarrhea that lasts more than 2 days or if it is severe and watery. This medication can make you more sensitive to the sun. Keep out of the sun. If you cannot avoid being in the sun, wear protective clothing and sunscreen. Do not use sun lamps, tanning beds, or tanning booths. Talk to your care team if you or your partner wish to become pregnant or think you might be pregnant. This medication can cause serious birth defects if taken during pregnancy and for 3 months after the last  dose. A reliable form of contraception is recommended while taking this medication and for 3 months after the last dose. Talk to your care team about effective forms of contraception. Do not father a child while taking this medication and for 3 months after the last dose. Use a condom while having sex during this time period. Do not breastfeed while taking this medication. This medication may cause infertility. Talk to your care team if you are concerned about your fertility. What side effects may I notice from receiving this medication? Side effects that you should report to your care team as soon as possible: Allergic reactions--skin rash, itching, hives, swelling of  the face, lips, tongue, or throat Heart attack--pain or tightness in the chest, shoulders, arms, or jaw, nausea, shortness of breath, cold or clammy skin, feeling faint or lightheaded Heart failure--shortness of breath, swelling of the ankles, feet, or hands, sudden weight gain, unusual weakness or fatigue Heart rhythm changes--fast or irregular heartbeat, dizziness, feeling faint or lightheaded, chest pain, trouble breathing High ammonia level--unusual weakness or fatigue, confusion, loss of appetite, nausea, vomiting, seizures Infection--fever, chills, cough, sore throat, wounds that don't heal, pain or trouble when passing urine, general feeling of discomfort or being unwell Low red blood cell level--unusual weakness or fatigue, dizziness, headache, trouble breathing Pain, tingling, or numbness in the hands or feet, muscle weakness, change in vision, confusion or trouble speaking, loss of balance or coordination, trouble walking, seizures Redness, swelling, and blistering of the skin over hands and feet Severe or prolonged diarrhea Unusual bruising or bleeding Side effects that usually do not require medical attention (report to your care team if they continue or are bothersome): Dry skin Headache Increased tears Nausea Pain,  redness, or swelling with sores inside the mouth or throat Sensitivity to light Vomiting This list may not describe all possible side effects. Call your doctor for medical advice about side effects. You may report side effects to FDA at 1-800-FDA-1088. Where should I keep my medication? This medication is given in a hospital or clinic. It will not be stored at home. NOTE: This sheet is a summary. It may not cover all possible information. If you have questions about this medicine, talk to your doctor, pharmacist, or health care provider.  2024 Elsevier/Gold Standard (2021-12-05 00:00:00)

## 2023-05-09 NOTE — Assessment & Plan Note (Signed)
04/30/2017: Left lumpectomy: IDC grade 3, 1.7 cm, DCIS, lymphovascular invasion present, margins negative, 0/1 lymph node negative, ER 0%, PR 0%, HER-2 negative ratio 1.37, Ki-67 40%, T1c N0 stage IB    Recommendation: 1. adjuvant chemotherapy with dose dense Adriamycin and Cytoxan 4 followed by Taxol weekly 6 discontinued for neuropathy 2. 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  This is especially worse at night.   Severe fatigue: exercizing. Obesity: Encouraged her to work on her diet to lose some weight. Patient's comorbidities for neuropathy also includes diabetes.  I stressed the importance of controlling her weight and blood sugars.   Breast cancer recurrence: mammogram 12/10/2022: Asymmetry/distortion left breast, ultrasound: 2 adjacent irregular masses 2 o'clock position left breast 1.9 cm and 1.2 cm, no suspicious lymph nodes Left breast biopsy 2:00 posterior: Grade 2 IDC with DCIS Left breast biopsy 2:00 anterior: Grade 2 IDC with DCIS ER 0%, PR 0%, HER2 0, Ki-67 30% 01/31/2023: Bone scan: Negative, CT CAP 01/26/2023: Negative for metastatic disease but nodular contour of the liver suggestive of cirrhosis 01/26/2023: Ultrasound left submandibular neck: Normal lymph node 1.2 cm 01/24/2023: MRI breast: The recently biopsied area measured 6.1 cm, skin thickening (post radiation)   Recommendation: 03/12/2023: Bilateral mastectomies: Right breast: Benign left mastectomy: Grade 3 IDC 6 cm with high-grade DCIS, margins negative, 1 intramammary lymph node negative, 0/2 sentinel lymph nodes, ER 0%, PR 0%, HER2 0, Ki-67 30% Followed by adjuvant chemotherapy with CMF x 6 cycles started  05/09/2023 ------------------------------------------------------------------------------------------------------------------------------------------------ Current treatment: Cycle 1 day 1 CMF Labs have been reviewed, chemo education completed, chemo consent obtained, antiemetics were reviewed.  Return to clinic in 1 week for toxicity check

## 2023-05-09 NOTE — Progress Notes (Signed)
Patient Care Team: [name], MD as PCP - General (Hematology and Oncology) [name], MD as Consulting Physician (Hematology and Oncology) Lonie Peak, MD as Attending Physician (Radiation Oncology) Harriette Bouillon, MD as Consulting Physician (General Surgery) Axel Filler Larna Daughters, NP as Nurse Practitioner (Hematology and Oncology)  DIAGNOSIS:  Encounter Diagnosis  Name Primary?   Malignant neoplasm of upper-outer quadrant of left breast in female, estrogen receptor negative (HCC) Yes    SUMMARY OF ONCOLOGIC HISTORY: Oncology History  Malignant neoplasm of upper-outer quadrant of left breast in female, estrogen receptor negative (HCC)  04/05/2017 Initial Diagnosis   Left breast asymmetry by ultrasound measured 1.3 cm at 2:30 position 10 cm from nipple, no axillary lymph nodes; biopsy IDC grade 2, ER 0%, PR 0%, HER-2 negative ratio 1.37, Ki-67 40%, T1c N0 stage IB AJCC 8    04/30/2017 Surgery   Left lumpectomy: IDC grade 3, 1.7 cm, DCIS, lymphovascular invasion present, margins negative, 0/1 lymph node negative, ER 0%, PR 0%, HER-2 negative ratio 1.37, Ki-67 40%, T1c N0 stage IB   05/22/2017 Genetic Testing   Patient had genetic testing due to a personal history of triple negative breast cancer.  The Common Hereditary Cancer Panel was ordered. The Hereditary Gene Panel offered by [facility] includes sequencing and/or deletion duplication testing of the following 46 genes: APC, ATM, AXIN2, BARD1, BMPR1A, BRCA1, BRCA2, BRIP1, CDH1, CDKN2A (p14ARF), CDKN2A (p16INK4a), CHEK2, CTNNA1, DICER1, EPCAM (Deletion/duplication testing only), GREM1 (promoter region deletion/duplication testing only), KIT, MEN1, MLH1, MSH2, MSH3, MSH6, MUTYH, NBN, NF1, NHTL1, PALB2, PDGFRA, PMS2, POLD1, POLE, PTEN, RAD50, RAD51C, RAD51D, SDHB, SDHC, SDHD, SMAD4, SMARCA4. STK11, TP53, TSC1, TSC2, and VHL.  The following genes were evaluated for sequence changes only: SDHA and HOXB13 c.251G>A variant only.     Results: No pathogenic mutations identified.  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  The date of this test report is 05/22/2017.    05/24/2017 - 08/30/2017 Chemotherapy   Dose dense Adriamycin and Cytoxan 4 followed by Taxol weekly 5 (stopped early for neuropathy)    09/26/2017 - 10/22/2017 Radiation Therapy   Adjuvant radiation therapy   12/10/2022 Relapse/Recurrence   Mammogram detected distortion, 2 irregular masses 1.9 cm and 1.2 cm: Biopsy grade 2 IDC with DCIS triple negative, scans negative, MRI breast: 6.1 cm   03/12/2023 Surgery   Bilateral mastectomies: Right breast: Benign  left mastectomy: Grade 3 IDC 6 cm with high-grade DCIS, margins negative, 1 intramammary lymph node negative, 0/2 sentinel lymph nodes, ER 0%, PR 0%, HER2 0, Ki-67 30%   04/18/2023 -  Chemotherapy   Patient is on Treatment Plan : BREAST Adjuvant CMF IV q21d       CHIEF COMPLIANT: Cycle 1 CMF  Discussed the use of AI scribe software for clinical note transcription with the patient, who gave verbal consent to proceed.  History of Present Illness   The patient, with a history of breast cancer, is prepared for her first chemotherapy with CMF. She has been experiencing fluid build-up under both breasts, which has been drained three times. The patient hopes it won't fill up again. She also has diabetes, which is managed with metformin, Humulin insulin three times a day, and recently started Ozempic. Her blood sugar was high at 220, despite having a small breakfast of yogurt and cereal. Her morning blood sugar was 150.         ALLERGIES:  is allergic to canagliflozin, empagliflozin, other, sulfa antibiotics, and ciprofloxacin.  MEDICATIONS:  Current Outpatient Medications  Medication Sig Dispense Refill   acetaminophen (TYLENOL) 650 MG CR tablet Take 650 mg by mouth every 8 (eight) hours as needed for pain.     atorvastatin (LIPITOR) 10 MG tablet 1 tablet     clotrimazole-betamethasone  (LOTRISONE) cream      Continuous Blood Gluc Sensor (FREESTYLE LIBRE 2 SENSOR) MISC .     DULoxetine (CYMBALTA) 30 MG capsule Take 30 mg by mouth daily.     gabapentin (NEURONTIN) 100 MG capsule Take 100 mg by mouth 3 (three) times daily.     glucose blood (ONETOUCH VERIO) test strip use to check blood sugar     glucose blood test strip by miscellaneous route.     Insulin Pen Needle (B-D ULTRAFINE III SHORT PEN) 31G X 8 MM MISC 3 (three) times daily.     insulin regular human CONCENTRATED (HUMULIN R U-500 KWIKPEN) 500 UNIT/ML kwikpen 150 units before breakfast, 80 units before lunch, 80 units before evening meal     lidocaine-prilocaine (EMLA) cream Apply to affected area once 30 g 3   losartan (COZAAR) 100 MG tablet Take 100 mg by mouth daily.     metFORMIN (GLUCOPHAGE-XR) 500 MG 24 hr tablet 2 tablets with evening meal     methocarbamol (ROBAXIN) 500 MG tablet Take 1 tablet (500 mg total) by mouth every 6 (six) hours as needed for muscle spasms. 20 tablet 3   ondansetron (ZOFRAN) 8 MG tablet Take 1 tablet (8 mg total) by mouth every 8 (eight) hours as needed for nausea or vomiting. Start on the third day after chemotherapy. 30 tablet 1   ONETOUCH VERIO test strip 2 (two) times daily.     oxyCODONE (OXY IR/ROXICODONE) 5 MG immediate release tablet Take 1 tablet (5 mg total) by mouth every 4 (four) hours as needed for moderate pain. 30 tablet 0   pregabalin (LYRICA) 150 MG capsule 1 capsule     prochlorperazine (COMPAZINE) 10 MG tablet Take 1 tablet (10 mg total) by mouth every 6 (six) hours as needed for nausea or vomiting. 30 tablet 1   Semaglutide,0.25 or 0.5MG /DOS, 2 MG/1.5ML SOPN Inject 0.25 mg into the skin once a week.     senna (SENOKOT) 8.6 MG TABS tablet Take 1 tablet (8.6 mg total) by mouth 2 (two) times daily. 60 tablet 0   triamterene-hydrochlorothiazide (MAXZIDE-25) 37.5-25 MG tablet Take 1 tablet by mouth every morning.     No current facility-administered medications for this  visit.    PHYSICAL EXAMINATION: ECOG PERFORMANCE STATUS: 1 - Symptomatic but completely ambulatory  Vitals:   05/09/23 0833  BP: 127/65  Pulse: 93  Resp: 18  Temp: 97.7 F (36.5 C)  SpO2: 100%   Filed Weights   05/09/23 0833  Weight: 225 lb (102.1 kg)     LABORATORY DATA:  I have reviewed the data as listed    Latest Ref Rng & Units 05/09/2023    8:11 AM 03/06/2023    9:51 AM 02/04/2023    8:37 AM  CMP  Glucose 70 - 99 mg/dL 161  096  045   BUN 8 - 23 mg/dL 28  23  20    Creatinine 0.44 - 1.00 mg/dL 4.09  8.11  9.14   Sodium 135 - 145 mmol/L 140  134  138   Potassium 3.5 - 5.1 mmol/L 3.9  4.3  3.7   Chloride 98 - 111 mmol/L 106  101  104   CO2 22 - 32 mmol/L 25  24  28   Calcium 8.9 - 10.3 mg/dL 8.8  9.1  9.1   Total Protein 6.5 - 8.1 g/dL 6.9   6.8   Total Bilirubin 0.3 - 1.2 mg/dL 0.4   0.6   Alkaline Phos 38 - 126 U/L 73   62   AST 15 - 41 U/L 48   34   ALT 0 - 44 U/L 32   24     Lab Results  Component Value Date   WBC 5.2 05/09/2023   HGB 11.3 (L) 05/09/2023   HCT 36.6 05/09/2023   MCV 90.6 05/09/2023   PLT 158 05/09/2023   NEUTROABS 2.4 05/09/2023    ASSESSMENT & PLAN:  Malignant neoplasm of upper-outer quadrant of left breast in female, estrogen receptor negative (HCC) 04/30/2017: Left lumpectomy: IDC grade 3, 1.7 cm, DCIS, lymphovascular invasion present, margins negative, 0/1 lymph node negative, ER 0%, PR 0%, HER-2 negative ratio 1.37, Ki-67 40%, T1c N0 stage IB    Recommendation: 1. adjuvant chemotherapy with dose dense Adriamycin and Cytoxan 4 followed by Taxol weekly 6 discontinued for neuropathy 2. 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  This is especially worse at night.   Severe fatigue: exercizing. Obesity: Encouraged her to work on her diet to  lose some weight. Patient's comorbidities for neuropathy also includes diabetes.  I stressed the importance of controlling her weight and blood sugars.   Breast cancer recurrence: mammogram 12/10/2022: Asymmetry/distortion left breast, ultrasound: 2 adjacent irregular masses 2 o'clock position left breast 1.9 cm and 1.2 cm, no suspicious lymph nodes Left breast biopsy 2:00 posterior: Grade 2 IDC with DCIS Left breast biopsy 2:00 anterior: Grade 2 IDC with DCIS ER 0%, PR 0%, HER2 0, Ki-67 30% 01/31/2023: Bone scan: Negative, CT CAP 01/26/2023: Negative for metastatic disease but nodular contour of the liver suggestive of cirrhosis 01/26/2023: Ultrasound left submandibular neck: Normal lymph node 1.2 cm 01/24/2023: MRI breast: The recently biopsied area measured 6.1 cm, skin thickening (post radiation)   Recommendation: 03/12/2023: Bilateral mastectomies: Right breast: Benign left mastectomy: Grade 3 IDC 6 cm with high-grade DCIS, margins negative, 1 intramammary lymph node negative, 0/2 sentinel lymph nodes, ER 0%, PR 0%, HER2 0, Ki-67 30% Followed by adjuvant chemotherapy with CMF x 6 cycles started 05/09/2023 ------------------------------------------------------------------------------------------------------------------------------------------------ Current treatment: Cycle 1 day 1 CMF Labs have been reviewed, chemo education completed, chemo consent obtained, antiemetics were reviewed.  Post-Surgical Seroma Fluid accumulation under the breast post-surgery. Drained three times. No current pain. -Continue monitoring and draining as needed.  Type 2 Diabetes Mellitus Elevated morning glucose levels despite Metformin, Humulin, and recently started Ozempic. Patient's diet includes high carbohydrate foods. -Continue Metformin, Humulin, and Ozempic. -Monitor blood glucose levels closely. -Consider dietary modifications to reduce carbohydrate intake.  Follow-up Next appointment scheduled for October  17th with Lillia Abed. -Continue with scheduled appointments.        No orders of the defined types were placed in this encounter.  The patient has a good understanding of the overall plan. she agrees with it. she will call with any problems that may develop before the next visit here. Total time spent: 30 mins including face to face time and time spent for planning, charting and co-ordination of care   Tamsen Meek, MD 05/09/23

## 2023-05-10 ENCOUNTER — Telehealth: Payer: Self-pay | Admitting: *Deleted

## 2023-05-10 NOTE — Telephone Encounter (Signed)
-----   Message from Nurse Currie Paris sent at 05/09/2023 11:59 AM EDT ----- Regarding: First Time Cytoxan, Methotrexate, 5FU. Pt of [place]. First time Cytoxan, Methotrexate, 5FU. Pt of [place]. Tolerated well. Not her first Chemotherapy. Please call to check in with pt, thank you!

## 2023-05-10 NOTE — Telephone Encounter (Signed)
Called pt to see how she did with her recent treatment & she repots that she is doing OK. She denies any problems & knows how to reach [place] if needed & knows her next appts.

## 2023-05-14 ENCOUNTER — Encounter: Payer: Self-pay | Admitting: Rehabilitation

## 2023-05-14 ENCOUNTER — Ambulatory Visit: Payer: Medicare Other | Attending: General Surgery | Admitting: Rehabilitation

## 2023-05-14 DIAGNOSIS — M25612 Stiffness of left shoulder, not elsewhere classified: Secondary | ICD-10-CM | POA: Diagnosis not present

## 2023-05-14 DIAGNOSIS — Z171 Estrogen receptor negative status [ER-]: Secondary | ICD-10-CM | POA: Diagnosis not present

## 2023-05-14 DIAGNOSIS — M25611 Stiffness of right shoulder, not elsewhere classified: Secondary | ICD-10-CM | POA: Insufficient documentation

## 2023-05-14 DIAGNOSIS — I890 Lymphedema, not elsewhere classified: Secondary | ICD-10-CM | POA: Diagnosis not present

## 2023-05-14 DIAGNOSIS — C50412 Malignant neoplasm of upper-outer quadrant of left female breast: Secondary | ICD-10-CM | POA: Diagnosis not present

## 2023-05-14 DIAGNOSIS — Z9013 Acquired absence of bilateral breasts and nipples: Secondary | ICD-10-CM | POA: Insufficient documentation

## 2023-05-14 NOTE — Therapy (Signed)
OUTPATIENT PHYSICAL THERAPY  UPPER EXTREMITY ONCOLOGY TREATMENT  Patient Name: Audrey Peters MRN: [id number] DOB:01/10/1956, 67 y.o., female Today's Date: 05/14/2023  END OF SESSION:  PT End of Session - 05/14/23 1101     Visit Number 4    Number of Visits 14    Date for PT Re-Evaluation 06/17/23    Authorization Type none needed    PT Start Time 1105    PT Stop Time 1153    PT Time Calculation (min) 48 min    Activity Tolerance Patient tolerated treatment well    Behavior During Therapy WFL for tasks assessed/performed              Past Medical History:  Diagnosis Date   Anemia yrs ago   Arthritis    Breast cancer (HCC)    Cancer (HCC)    recent dx in breast   Carpal tunnel syndrome of right wrist    Diabetes mellitus without complication (HCC)    dx 2008   Headache    sinus   Hypertension    Peripheral neuropathy 2024   Hands and Feet   Personal history of chemotherapy    Personal history of radiation therapy    Vaginal delivery 1983   Past Surgical History:  Procedure Laterality Date   BREAST BIOPSY     BREAST BIOPSY Left 01/01/2023   [place] LT BREAST BX W LOC DEV 1ST LESION IMG BX SPEC US GUIDE 01/01/2023 [facility]   BREAST BIOPSY Left 01/01/2023   [place] LT BREAST BX W LOC DEV EA ADD LESION IMG BX SPEC US GUIDE 01/01/2023 [facility]   BREAST LUMPECTOMY Left    BREAST LUMPECTOMY WITH RADIOACTIVE SEED AND SENTINEL LYMPH NODE BIOPSY Left 04/30/2017   Procedure: LEFT BREAST LUMPECTOMY WITH RADIOACTIVE SEED AND LEFT SENTINEL LYMPH NODE BIOPSY ERAS PATHWAY;  Surgeon: Harriette Bouillon, MD;  Location: [facility];  Service: General;  Laterality: Left;   COLONOSCOPY WITH PROPOFOL N/A 05/28/2016   Procedure: COLONOSCOPY WITH PROPOFOL;  Surgeon: Charolett Bumpers, MD;  Location: [facility];  Service: Endoscopy;  Laterality: N/A;   DILATATION & CURETTAGE/HYSTEROSCOPY WITH MYOSURE N/A 04/20/2020   Procedure: DILATATION & CURETTAGE/HYSTEROSCOPY WITH MYOSURE;  Surgeon:  Myna Hidalgo, DO;  Location: [facility];  Service: Gynecology;  Laterality: N/A;   DILATION AND CURETTAGE OF UTERUS     HYSTEROSCOPY WITH D & C N/A 07/21/2015   Procedure: DILATATION AND CURETTAGE /HYSTEROSCOPY with myosure;  Surgeon: Myna Hidalgo, DO;  Location: [facility];  Service: Gynecology;  Laterality: N/A;   MASTECTOMY W/ SENTINEL NODE BIOPSY Left 03/12/2023   Procedure: LEFT MASTECTOMY WITH SENTINEL LYMPH NODE BIOPSY;  Surgeon: Almond Lint, MD;  Location: [facility];  Service: General;  Laterality: Left;   PORTACATH PLACEMENT Right 04/30/2017   Procedure: INSERTION PORT-A-CATH;  Surgeon: Harriette Bouillon, MD;  Location: [facility];  Service: General;  Laterality: Right;   PORTACATH PLACEMENT N/A 03/12/2023   Procedure: PORT PLACEMENT WITH ULTRASOUND GUIDANCE;  Surgeon: Almond Lint, MD;  Location: [facility];  Service: General;  Laterality: N/A;   SIMPLE MASTECTOMY WITH AXILLARY SENTINEL NODE BIOPSY Right 03/12/2023   Procedure: RIGHT MASTECTOMY;  Surgeon: Almond Lint, MD;  Location: [facility];  Service: General;  Laterality: Right;   Patient Active Problem List   Diagnosis Date Noted   Recurrent breast cancer, left (HCC) 03/12/2023   Atrophy of vagina 09/12/2020   Bilateral lower extremity edema 09/12/2020   History of ductal  carcinoma in situ of breast 09/12/2020   Hyperglycemia due to type 2 diabetes mellitus (HCC) 09/12/2020   Knee pain 09/12/2020   Long term (current) use of insulin (HCC) 09/12/2020   Lumbosacral spondylosis without myelopathy 09/12/2020   Mixed hyperlipidemia 09/12/2020   Obstructive sleep apnea syndrome 09/12/2020   Ovarian cyst 09/12/2020   Overweight 09/12/2020   Personal history of malignant neoplasm of breast 09/12/2020   Postmenopausal bleeding 09/12/2020   Pure hypercholesterolemia 09/12/2020   Sciatica 09/12/2020   Morbid obesity (HCC) 09/12/2020   Chemotherapy-induced peripheral neuropathy  (HCC) 01/22/2018   Encounter for antineoplastic chemotherapy 06/07/2017   Port-A-Cath in place 05/24/2017   Genetic testing 05/23/2017   Malignant neoplasm of upper-outer quadrant of left breast in female, estrogen receptor negative (HCC) 04/22/2017   Essential hypertension 01/30/2015   Diabetes mellitus (HCC) 01/30/2015    REFERRING PROVIDER: Dr. Donell Beers    REFERRING DIAG:  Diagnosis  C50.412,Z17.1 (ICD-10-CM) - Malignant neoplasm of upper-outer quadrant of left breast in female, estrogen receptor negative (HCC)    THERAPY DIAG:  Malignant neoplasm of upper-outer quadrant of left breast in female, estrogen receptor negative (HCC)  Lymphedema, not elsewhere classified  Stiffness of left shoulder, not elsewhere classified  Stiffness of right shoulder, not elsewhere classified  Status post mastectomy, bilateral  ONSET DATE: 12/2022  Rationale for Evaluation and Treatment: Rehabilitation  SUBJECTIVE:                                                                                                                                                                                           SUBJECTIVE STATEMENT:  My hand is swollen after my infusion!    PERTINENT HISTORY: New/recurrent breast cancer on the left. 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 Port placement and bil mastectomy 03/12/23 with removal of 2 negative nodes. Treatment in 2018 with lumpectomy, SLNB, chemo and XRT.  1 negative node removed in 2018.   aspirarted frorm Rt and 150 from Lt 04/16/23. Seroma catheters may be needed. Will be doing chemo again.    PAIN:  Are you having pain? The pain is worse when she lies down and moves, but is better when she sits still.   PRECAUTIONS: Lt lymphedema   RED FLAGS: None   WEIGHT BEARING RESTRICTIONS: No  FALLS:  Has patient fallen in last 6 months? No  LIVING ENVIRONMENT: Lives with: lives with their family and lives with their spouse  OCCUPATION: Retired    LEISURE: nothing really,  get back to water aerobics.    HAND DOMINANCE: right   PRIOR LEVEL OF FUNCTION: Independent  PATIENT GOALS: get more movement back  OBJECTIVE:  COGNITION: Overall cognitive status: Within functional limits for tasks assessed   PALPATION: sloshing full chest bilateral chest wall with return of seroma - note sent to Dr. Donell Beers   OBSERVATIONS / OTHER ASSESSMENTS: Left incision well healed - Rt incision has a small place covered with a band-aid that is still open and draining - white tissue present.   Lt back of hand and wrist appear larger with less visible landmarks  SENSATION: Numbness left tricep region.   POSTURE: rounded shoulders, forward head    UPPER EXTREMITY AROM/PROM:  A/PROM RIGHT   eval  RIGHT  05/07/2023  Shoulder extension 50   Shoulder flexion 115 - incision 168  Shoulder abduction 90 - pulls in incision 157 no pulling   Shoulder internal rotation    Shoulder external rotation 75     (Blank rows = not tested)  A/PROM LEFT   eval LEFT 05/07/2023  Shoulder extension 50   Shoulder flexion 117 - feels cording like arm pull but not visible 152 ( no pulling)   Shoulder abduction 85 148  Shoulder internal rotation    Shoulder external rotation 75     (Blank rows = not tested)  UPPER EXTREMITY STRENGTH:   LYMPHEDEMA ASSESSMENTS:   LANDMARK RIGHT  eval  At axilla    15 cm proximal to olecranon process 39.7  10 cm proximal to olecranon process 38.7  Olecranon process 32.5  15 cm proximal to ulnar styloid process 27.3  10 cm proximal to ulnar styloid process 25.1  Just proximal to ulnar styloid process 18  Across hand at thumb web space 20.5  At base of 2nd digit 6.1  (Blank rows = not tested)  LANDMARK LEFT  eval  At axilla    15 cm proximal to olecranon process 40.5  10 cm proximal to olecranon process 40.5  Olecranon process 36.5  15 cm proximal to ulnar styloid process 29.2  10 cm proximal to ulnar styloid  process 25.8  Just proximal to ulnar styloid process 18.8  Across hand at thumb web space 20.3  At base of 2nd digit 5.8  (Blank rows = not tested) 10cm  Rt: 2981 Lt: 3395 difference  QUICK DASH SURVEY: 45%   TODAY'S TREATMENT:                                                                                                                                          DATE:  05/14/23 Pt had her first infusion last week and noticed increased edema in the Lt UE with visible edema in the back of her hand, but already improving.   [facility] with pt in room to see about the sleeve and they sounded like she would have to self reimburseat a total of $350 Showed pt how she could order online for much less and how I would check with 1-2 more  places for potential ordering before she self orders.   Also printed out a new [employer] glove info sheet as well as this glove is only $50.  Showed pt finger bandaging with use of tg soft medium - applied this and then educated patient and she performed x 2.  Cueing and changes as needed.  Written handout given.  Education on how it should feel and when to remove it.   Extended POC due to worsening edema.   05/07/2023 Adjusted foam to fit inside front pockets on each side to hopefully help keep foam in place at fullness on chest. "Sloshing" still evident today especailly on left side.  Began with cervical, upper thoracis and UE ROM in sitting.  Pt able to do 5 sit to stands with no arm push up. Encouraged pt to continue with this at home. To supine for supine dowel exercise for bilateral shoulder flexion, manual PROM with gentle strech , MLD to chest and arm, same performed to each side. Then to sidelying for more MLD to back and lateral chest on each side.  Remeasured arm ROM with significant imrpovment since eval   04/30/23 Made 2 large foam rectangles with 1/2" gray foam to place over seroma areas now that incision is healed and protected.   Supine wearing compression: bil shoulder PROM flexion, abduction, ER MLD short neck, bil shoulder collectors, superficial and deep abdominals with resisted breathing, Lt axilloinguinal anastamosis and then Lt UE from proximal to distal and then in reverse with education on lymphadema.   04/23/23 Eval performed Pt seems to have some baseline Lt UE lymphedema from prior treatment of her triple negative cancer or with her new onset of cancer.   Education on sleeve types to consider with pt wanting a black color Measured pt for sigvaris secure arm sleeve size L3; large average length and size medium glove.  She will start with fabric in between flat knit and circular knit but may need to get a flat knit custom if she is not contained.   Order will be submitted to [facility] today by therapist.   Education on post op stretches to start with demo and education for each - added wall walking into flexion as an alternative for hands clasped flexion  PATIENT EDUCATION:  Education details: per today's note Person educated: Patient Education method: Chief Technology Officer Education comprehension: verbalized understanding, returned demonstration, and needs further education  HOME EXERCISE PROGRAM: Post op breast with wall flexion as an option instead of hands clasped - pt aware this may change with drain placement.    ASSESSMENT:  CLINICAL IMPRESSION: Pt continues with significant seroma pockets bil chest wall even after draining and goes back again tomorrow. Pt now has visible back of hand edema after her first infusion so we focused on her getting a sleeve and applied and educated pt on finger bandaging and use of tg soft.  Pt reports edema is already subsiding after a few days.    OBJECTIVE IMPAIRMENTS: decreased activity tolerance, decreased knowledge of condition, decreased knowledge of use of DME, decreased ROM, decreased strength, and pain.   ACTIVITY LIMITATIONS: carrying, lifting, and reach  over head  PARTICIPATION LIMITATIONS: cleaning, laundry, and community activity  PERSONAL FACTORS: Time since onset of injury/illness/exacerbation and 3+ comorbidities: radiation hx to axilla, SLNB, disease recurrence.    are also affecting patient's functional outcome.   REHAB POTENTIAL: Good  CLINICAL DECISION MAKING: Evolving/moderate complexity  EVALUATION COMPLEXITY: Moderate  GOALS: Goals reviewed with patient? Yes  SHORT TERM GOALS: Target date: 05/07/23  Pt will be educated on lymphedema - physiology, healthy habits, importance of treatment, and treatment options  Baseline: Goal status: INITIAL   LONG TERM GOALS: Target date: 05/28/23  Pt will obtain compression sleeve and glove for use during treatment with education on use Baseline:  Goal status: INITIAL  2.  Pt will improve bil shoulder reach to [facility] to allow for reach into the cabinets Baseline:  Goal status: INITIAL  3.  Pt will be ind with self MLD for the Lt UE  Baseline:  Goal status: INITIAL  4.  Pt will decrease QDASH to 15% or less to demonstrate improved mobility in the UE Baseline:  Goal status: INITIAL  5.  Pt will be ind with final HEP Baseline:  Goal status: INITIAL   PLAN:  PT FREQUENCY: 1-2x/week  PT DURATION: other: 5 weeks   PLANNED INTERVENTIONS: Therapeutic exercises, Neuromuscular re-education, Patient/Family education, Self Care, DME instructions, Manual therapy, and Re-evaluation  PLAN FOR NEXT SESSION: try fingers bandaging and tg soft? How is swelling? Order anything?  education on lymphedema - principles of care, physiology, etc, risk reduction and healthy practices.  Bil shoulder AAROM/PROM as able , MLD, may need full CDT  Added: 05/01/23: no coverage at [facility], faxed order to [facility] due to [employer] [place] 05/06/23: from [facility]: she would have to pay OOP but then could get reimbursed by her [employer]? Will let know if pt would like to do this 05/14/23: [facility] and no coverage is guaranteed so she will not order there.  PT will check and send 1 more place before pt self orders.  She has ordering info.  05/16/23 - entered into [employer] portal    Idamae Lusher, PT 05/14/2023, 11:54 AM [place]

## 2023-05-16 ENCOUNTER — Telehealth: Payer: Self-pay | Admitting: Rehabilitation

## 2023-05-16 NOTE — Telephone Encounter (Signed)
Called pt to let her know that we submitted her sleeve order to [facility] through [employer].

## 2023-05-21 ENCOUNTER — Encounter: Payer: Self-pay | Admitting: Rehabilitation

## 2023-05-21 ENCOUNTER — Ambulatory Visit: Payer: Medicare Other | Admitting: Rehabilitation

## 2023-05-21 DIAGNOSIS — M25612 Stiffness of left shoulder, not elsewhere classified: Secondary | ICD-10-CM

## 2023-05-21 DIAGNOSIS — M25611 Stiffness of right shoulder, not elsewhere classified: Secondary | ICD-10-CM

## 2023-05-21 DIAGNOSIS — I890 Lymphedema, not elsewhere classified: Secondary | ICD-10-CM | POA: Diagnosis not present

## 2023-05-21 DIAGNOSIS — Z9013 Acquired absence of bilateral breasts and nipples: Secondary | ICD-10-CM | POA: Diagnosis not present

## 2023-05-21 DIAGNOSIS — C50412 Malignant neoplasm of upper-outer quadrant of left female breast: Secondary | ICD-10-CM | POA: Diagnosis not present

## 2023-05-21 DIAGNOSIS — Z171 Estrogen receptor negative status [ER-]: Secondary | ICD-10-CM | POA: Diagnosis not present

## 2023-05-21 NOTE — Therapy (Signed)
OUTPATIENT PHYSICAL THERAPY  UPPER EXTREMITY ONCOLOGY TREATMENT  Patient Name: Audrey Peters MRN: [id number] DOB:08/27/1955, 67 y.o., female Today's Date: 05/21/2023  END OF SESSION:  PT End of Session - 05/21/23 1058     Visit Number 5    Number of Visits 14    Date for PT Re-Evaluation 06/17/23    PT Start Time 1100    PT Stop Time 1145    PT Time Calculation (min) 45 min    Activity Tolerance Patient tolerated treatment well    Behavior During Therapy WFL for tasks assessed/performed              Past Medical History:  Diagnosis Date   Anemia yrs ago   Arthritis    Breast cancer (HCC)    Cancer (HCC)    recent dx in breast   Carpal tunnel syndrome of right wrist    Diabetes mellitus without complication (HCC)    dx 2008   Headache    sinus   Hypertension    Peripheral neuropathy 2024   Hands and Feet   Personal history of chemotherapy    Personal history of radiation therapy    Vaginal delivery 1983   Past Surgical History:  Procedure Laterality Date   BREAST BIOPSY     BREAST BIOPSY Left 01/01/2023   [place] LT BREAST BX W LOC DEV 1ST LESION IMG BX SPEC US GUIDE 01/01/2023 [facility]   BREAST BIOPSY Left 01/01/2023   [place] LT BREAST BX W LOC DEV EA ADD LESION IMG BX SPEC US GUIDE 01/01/2023 [facility]   BREAST LUMPECTOMY Left    BREAST LUMPECTOMY WITH RADIOACTIVE SEED AND SENTINEL LYMPH NODE BIOPSY Left 04/30/2017   Procedure: LEFT BREAST LUMPECTOMY WITH RADIOACTIVE SEED AND LEFT SENTINEL LYMPH NODE BIOPSY ERAS PATHWAY;  Surgeon: Harriette Bouillon, MD;  Location: [facility];  Service: General;  Laterality: Left;   COLONOSCOPY WITH PROPOFOL N/A 05/28/2016   Procedure: COLONOSCOPY WITH PROPOFOL;  Surgeon: Charolett Bumpers, MD;  Location: [facility];  Service: Endoscopy;  Laterality: N/A;   DILATATION & CURETTAGE/HYSTEROSCOPY WITH MYOSURE N/A 04/20/2020   Procedure: DILATATION & CURETTAGE/HYSTEROSCOPY WITH MYOSURE;  Surgeon: Myna Hidalgo, DO;  Location:  [facility];  Service: Gynecology;  Laterality: N/A;   DILATION AND CURETTAGE OF UTERUS     HYSTEROSCOPY WITH D & C N/A 07/21/2015   Procedure: DILATATION AND CURETTAGE /HYSTEROSCOPY with myosure;  Surgeon: Myna Hidalgo, DO;  Location: [facility];  Service: Gynecology;  Laterality: N/A;   MASTECTOMY W/ SENTINEL NODE BIOPSY Left 03/12/2023   Procedure: LEFT MASTECTOMY WITH SENTINEL LYMPH NODE BIOPSY;  Surgeon: Almond Lint, MD;  Location: [facility];  Service: General;  Laterality: Left;   PORTACATH PLACEMENT Right 04/30/2017   Procedure: INSERTION PORT-A-CATH;  Surgeon: Harriette Bouillon, MD;  Location: [facility];  Service: General;  Laterality: Right;   PORTACATH PLACEMENT N/A 03/12/2023   Procedure: PORT PLACEMENT WITH ULTRASOUND GUIDANCE;  Surgeon: Almond Lint, MD;  Location: [facility];  Service: General;  Laterality: N/A;   SIMPLE MASTECTOMY WITH AXILLARY SENTINEL NODE BIOPSY Right 03/12/2023   Procedure: RIGHT MASTECTOMY;  Surgeon: Almond Lint, MD;  Location: [facility];  Service: General;  Laterality: Right;   Patient Active Problem List   Diagnosis Date Noted   Recurrent breast cancer, left (HCC) 03/12/2023   Atrophy of vagina 09/12/2020   Bilateral lower extremity edema 09/12/2020   History of ductal carcinoma in situ of breast 09/12/2020  Hyperglycemia due to type 2 diabetes mellitus (HCC) 09/12/2020   Knee pain 09/12/2020   Long term (current) use of insulin (HCC) 09/12/2020   Lumbosacral spondylosis without myelopathy 09/12/2020   Mixed hyperlipidemia 09/12/2020   Obstructive sleep apnea syndrome 09/12/2020   Ovarian cyst 09/12/2020   Overweight 09/12/2020   Personal history of malignant neoplasm of breast 09/12/2020   Postmenopausal bleeding 09/12/2020   Pure hypercholesterolemia 09/12/2020   Sciatica 09/12/2020   Morbid obesity (HCC) 09/12/2020   Chemotherapy-induced peripheral neuropathy (HCC) 01/22/2018   Encounter  for antineoplastic chemotherapy 06/07/2017   Port-A-Cath in place 05/24/2017   Genetic testing 05/23/2017   Malignant neoplasm of upper-outer quadrant of left breast in female, estrogen receptor negative (HCC) 04/22/2017   Essential hypertension 01/30/2015   Diabetes mellitus (HCC) 01/30/2015    REFERRING PROVIDER: Dr. Donell Beers    REFERRING DIAG:  Diagnosis  C50.412,Z17.1 (ICD-10-CM) - Malignant neoplasm of upper-outer quadrant of left breast in female, estrogen receptor negative (HCC)    THERAPY DIAG:  Malignant neoplasm of upper-outer quadrant of left breast in female, estrogen receptor negative (HCC)  Lymphedema, not elsewhere classified  Stiffness of left shoulder, not elsewhere classified  Stiffness of right shoulder, not elsewhere classified  Status post mastectomy, bilateral  ONSET DATE: 12/2022  Rationale for Evaluation and Treatment: Rehabilitation  SUBJECTIVE:                                                                                                                                                                                           SUBJECTIVE STATEMENT:  I found my neuropathy glove and it seems to work for the finger part.  They did drain it again last week but it doesn't seem as much.     PERTINENT HISTORY: New/recurrent breast cancer on the left. 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 Port placement and bil mastectomy 03/12/23 with removal of 2 negative nodes. Treatment in 2018 with lumpectomy, SLNB, chemo and XRT.  1 negative node removed in 2018.   aspirarted frorm Rt and 150 from Lt 04/16/23. Seroma catheters may be needed. Will be doing chemo again.    PAIN:  Are you having pain? The pain is worse when she lies down and moves, but is better when she sits still.   PRECAUTIONS: Lt lymphedema   RED FLAGS: None   WEIGHT BEARING RESTRICTIONS: No  FALLS:  Has patient fallen in last 6 months? No  LIVING ENVIRONMENT: Lives with: lives with their  family and lives with their spouse  OCCUPATION: Retired   LEISURE: nothing really,  get back to water aerobics.    HAND DOMINANCE: right  PRIOR LEVEL OF FUNCTION: Independent  PATIENT GOALS: get more movement back    OBJECTIVE:  COGNITION: Overall cognitive status: Within functional limits for tasks assessed   PALPATION: sloshing full chest bilateral chest wall with return of seroma - note sent to Dr. Donell Beers   OBSERVATIONS / OTHER ASSESSMENTS: Left incision well healed - Rt incision has a small place covered with a band-aid that is still open and draining - white tissue present.   Lt back of hand and wrist appear larger with less visible landmarks  SENSATION: Numbness left tricep region.   POSTURE: rounded shoulders, forward head    UPPER EXTREMITY AROM/PROM:  A/PROM RIGHT   eval  RIGHT  05/07/2023  Shoulder extension 50   Shoulder flexion 115 - incision 168  Shoulder abduction 90 - pulls in incision 157 no pulling   Shoulder internal rotation    Shoulder external rotation 75     (Blank rows = not tested)  A/PROM LEFT   eval LEFT 05/07/2023  Shoulder extension 50   Shoulder flexion 117 - feels cording like arm pull but not visible 152 ( no pulling)   Shoulder abduction 85 148  Shoulder internal rotation    Shoulder external rotation 75     (Blank rows = not tested)  UPPER EXTREMITY STRENGTH:   LYMPHEDEMA ASSESSMENTS:   LANDMARK RIGHT  eval  At axilla    15 cm proximal to olecranon process 39.7  10 cm proximal to olecranon process 38.7  Olecranon process 32.5  15 cm proximal to ulnar styloid process 27.3  10 cm proximal to ulnar styloid process 25.1  Just proximal to ulnar styloid process 18  Across hand at thumb web space 20.5  At base of 2nd digit 6.1  (Blank rows = not tested)  LANDMARK LEFT  eval  At axilla    15 cm proximal to olecranon process 40.5  10 cm proximal to olecranon process 40.5  Olecranon process 36.5  15 cm proximal to ulnar  styloid process 29.2  10 cm proximal to ulnar styloid process 25.8  Just proximal to ulnar styloid process 18.8  Across hand at thumb web space 20.3  At base of 2nd digit 5.8  (Blank rows = not tested) 10cm  Rt: 2981 Lt: 3395 difference  QUICK DASH SURVEY: 45%   TODAY'S TREATMENT:                                                                                                                                          DATE:  05/21/23 In supine: Short neck, superficial and deep abdominals and diaphragmatic breaths, bil axillary nodes and establishment of interaxillary pathway, then L UE working proximal to distal, moving fluid from upper inner arm outwards, and doing both sides of forearm moving fluid towards pathways spending extra time in any areas of fibrosis then retracing all steps  Supine dowel flexion  x 10  Supine chest stretch 2x15"    05/14/23 Pt had her first infusion last week and noticed increased edema in the Lt UE with visible edema in the back of her hand, but already improving.   [facility]ed [facility] with pt in room to see about the sleeve and they sounded like she would have to self reimburseat a total of $350 Showed pt how she could order online for much less and how I would check with 1-2 more places for potential ordering before she self orders.   Also printed out a new [employer] glove info sheet as well as this glove is only $50.  Showed pt finger bandaging with use of tg soft medium - applied this and then educated patient and she performed x 2.  Cueing and changes as needed.  Written handout given.  Education on how it should feel and when to remove it.   Extended POC due to worsening edema.   05/07/2023 Adjusted foam to fit inside front pockets on each side to hopefully help keep foam in place at fullness on chest. "Sloshing" still evident today especailly on left side.  Began with cervical, upper thoracis and UE ROM in sitting.  Pt able to do 5 sit to stands  with no arm push up. Encouraged pt to continue with this at home. To supine for supine dowel exercise for bilateral shoulder flexion, manual PROM with gentle strech , MLD to chest and arm, same performed to each side. Then to sidelying for more MLD to back and lateral chest on each side.  Remeasured arm ROM with significant imrpovment since eval   04/30/23 Made 2 large foam rectangles with 1/2" gray foam to place over seroma areas now that incision is healed and protected.  Supine wearing compression: bil shoulder PROM flexion, abduction, ER MLD short neck, bil shoulder collectors, superficial and deep abdominals with resisted breathing, Lt axilloinguinal anastamosis and then Lt UE from proximal to distal and then in reverse with education on lymphadema.   04/23/23 Eval performed Pt seems to have some baseline Lt UE lymphedema from prior treatment of her triple negative cancer or with her new onset of cancer.   Education on sleeve types to consider with pt wanting a black color Measured pt for sigvaris secure arm sleeve size L3; large average length and size medium glove.  She will start with fabric in between flat knit and circular knit but may need to get a flat knit custom if she is not contained.   Order will be submitted to [facility] today by therapist.   Education on post op stretches to start with demo and education for each - added wall walking into flexion as an alternative for hands clasped flexion  PATIENT EDUCATION:  Education details: per today's note Person educated: Patient Education method: Chief Technology [employer] Education comprehension: verbalized understanding, returned demonstration, and needs further education  HOME EXERCISE PROGRAM: Post op breast with wall flexion as an option instead of hands clasped - pt aware this may change with drain placement.    ASSESSMENT:  CLINICAL IMPRESSION:   OBJECTIVE IMPAIRMENTS: decreased activity tolerance, decreased knowledge of  condition, decreased knowledge of use of DME, decreased ROM, decreased strength, and pain.   ACTIVITY LIMITATIONS: carrying, lifting, and reach over head  PARTICIPATION LIMITATIONS: cleaning, laundry, and community activity  PERSONAL FACTORS: Time since onset of injury/illness/exacerbation and 3+ comorbidities: radiation hx to axilla, SLNB, disease recurrence.    are also affecting patient's functional outcome.  REHAB POTENTIAL: Good  CLINICAL DECISION MAKING: Evolving/moderate complexity  EVALUATION COMPLEXITY: Moderate  GOALS: Goals reviewed with patient? Yes  SHORT TERM GOALS: Target date: 05/07/23  Pt will be educated on lymphedema - physiology, healthy habits, importance of treatment, and treatment options  Baseline: Goal status: INITIAL   LONG TERM GOALS: Target date: 05/28/23  Pt will obtain compression sleeve and glove for use during treatment with education on use Baseline:  Goal status: INITIAL  2.  Pt will improve bil shoulder reach to [facility] to allow for reach into the cabinets Baseline:  Goal status: INITIAL  3.  Pt will be ind with self MLD for the Lt UE  Baseline:  Goal status: INITIAL  4.  Pt will decrease QDASH to 15% or less to demonstrate improved mobility in the UE Baseline:  Goal status: INITIAL  5.  Pt will be ind with final HEP Baseline:  Goal status: INITIAL   PLAN:  PT FREQUENCY: 1-2x/week  PT DURATION: other: 5 weeks   PLANNED INTERVENTIONS: Therapeutic exercises, Neuromuscular re-education, Patient/Family education, Self Care, DME instructions, Manual therapy, and Re-evaluation  PLAN FOR NEXT SESSION: try fingers bandaging and tg soft? How is swelling? Order anything?  education on lymphedema - principles of care, physiology, etc, risk reduction and healthy practices.  Bil shoulder AAROM/PROM as able , MLD, may need full CDT  Added: 05/01/23: no coverage at [facility], faxed order to [facility] due to [employer] [place] 05/06/23: from [facility]: she would have to pay OOP but then could get reimbursed by her [employer]? Will let know if pt would like to do this 05/14/23: called [facility] and no coverage is guaranteed so she will not order there.  PT will check and send 1 more place before pt self orders.  She has ordering info.  05/16/23 - entered into [employer] portal    Idamae Lusher, PT 05/21/2023, 11:43 AM [place]

## 2023-05-22 ENCOUNTER — Telehealth: Payer: Self-pay | Admitting: Hematology and Oncology

## 2023-05-22 DIAGNOSIS — E1140 Type 2 diabetes mellitus with diabetic neuropathy, unspecified: Secondary | ICD-10-CM | POA: Diagnosis not present

## 2023-05-22 DIAGNOSIS — E1165 Type 2 diabetes mellitus with hyperglycemia: Secondary | ICD-10-CM | POA: Diagnosis not present

## 2023-05-22 DIAGNOSIS — Z794 Long term (current) use of insulin: Secondary | ICD-10-CM | POA: Diagnosis not present

## 2023-05-27 ENCOUNTER — Ambulatory Visit: Payer: Medicare Other | Admitting: Rehabilitation

## 2023-05-27 ENCOUNTER — Encounter: Payer: Self-pay | Admitting: Rehabilitation

## 2023-05-27 DIAGNOSIS — M25612 Stiffness of left shoulder, not elsewhere classified: Secondary | ICD-10-CM

## 2023-05-27 DIAGNOSIS — C50412 Malignant neoplasm of upper-outer quadrant of left female breast: Secondary | ICD-10-CM | POA: Diagnosis not present

## 2023-05-27 DIAGNOSIS — M25611 Stiffness of right shoulder, not elsewhere classified: Secondary | ICD-10-CM

## 2023-05-27 DIAGNOSIS — I890 Lymphedema, not elsewhere classified: Secondary | ICD-10-CM

## 2023-05-27 DIAGNOSIS — Z9013 Acquired absence of bilateral breasts and nipples: Secondary | ICD-10-CM | POA: Diagnosis not present

## 2023-05-27 DIAGNOSIS — Z171 Estrogen receptor negative status [ER-]: Secondary | ICD-10-CM | POA: Diagnosis not present

## 2023-05-27 NOTE — Therapy (Signed)
OUTPATIENT PHYSICAL THERAPY  UPPER EXTREMITY ONCOLOGY TREATMENT  Patient Name: Audrey Peters MRN: [id number] DOB:11/16/1955, 67 y.o., female Today's Date: 05/27/2023  END OF SESSION:  PT End of Session - 05/27/23 1104     Visit Number 6    Number of Visits 14    Date for PT Re-Evaluation 06/17/23    PT Start Time 1106    PT Stop Time 1145    PT Time Calculation (min) 39 min    Activity Tolerance Patient tolerated treatment well    Behavior During Therapy WFL for tasks assessed/performed              Past Medical History:  Diagnosis Date   Anemia yrs ago   Arthritis    Breast cancer (HCC)    Cancer (HCC)    recent dx in breast   Carpal tunnel syndrome of right wrist    Diabetes mellitus without complication (HCC)    dx 2008   Headache    sinus   Hypertension    Peripheral neuropathy 2024   Hands and Feet   Personal history of chemotherapy    Personal history of radiation therapy    Vaginal delivery 1983   Past Surgical History:  Procedure Laterality Date   BREAST BIOPSY     BREAST BIOPSY Left 01/01/2023   [place] LT BREAST BX W LOC DEV 1ST LESION IMG BX SPEC US GUIDE 01/01/2023 [facility]   BREAST BIOPSY Left 01/01/2023   [place] LT BREAST BX W LOC DEV EA ADD LESION IMG BX SPEC US GUIDE 01/01/2023 [facility]   BREAST LUMPECTOMY Left    BREAST LUMPECTOMY WITH RADIOACTIVE SEED AND SENTINEL LYMPH NODE BIOPSY Left 04/30/2017   Procedure: LEFT BREAST LUMPECTOMY WITH RADIOACTIVE SEED AND LEFT SENTINEL LYMPH NODE BIOPSY ERAS PATHWAY;  Surgeon: Harriette Bouillon, MD;  Location: [facility];  Service: General;  Laterality: Left;   COLONOSCOPY WITH PROPOFOL N/A 05/28/2016   Procedure: COLONOSCOPY WITH PROPOFOL;  Surgeon: Charolett Bumpers, MD;  Location: [facility];  Service: Endoscopy;  Laterality: N/A;   DILATATION & CURETTAGE/HYSTEROSCOPY WITH MYOSURE N/A 04/20/2020   Procedure: DILATATION & CURETTAGE/HYSTEROSCOPY WITH MYOSURE;  Surgeon: Myna Hidalgo, DO;  Location:  [facility];  Service: Gynecology;  Laterality: N/A;   DILATION AND CURETTAGE OF UTERUS     HYSTEROSCOPY WITH D & C N/A 07/21/2015   Procedure: DILATATION AND CURETTAGE /HYSTEROSCOPY with myosure;  Surgeon: Myna Hidalgo, DO;  Location: [facility];  Service: Gynecology;  Laterality: N/A;   MASTECTOMY W/ SENTINEL NODE BIOPSY Left 03/12/2023   Procedure: LEFT MASTECTOMY WITH SENTINEL LYMPH NODE BIOPSY;  Surgeon: Almond Lint, MD;  Location: [facility];  Service: General;  Laterality: Left;   PORTACATH PLACEMENT Right 04/30/2017   Procedure: INSERTION PORT-A-CATH;  Surgeon: Harriette Bouillon, MD;  Location: [facility];  Service: General;  Laterality: Right;   PORTACATH PLACEMENT N/A 03/12/2023   Procedure: PORT PLACEMENT WITH ULTRASOUND GUIDANCE;  Surgeon: Almond Lint, MD;  Location: [facility];  Service: General;  Laterality: N/A;   SIMPLE MASTECTOMY WITH AXILLARY SENTINEL NODE BIOPSY Right 03/12/2023   Procedure: RIGHT MASTECTOMY;  Surgeon: Almond Lint, MD;  Location: [facility];  Service: General;  Laterality: Right;   Patient Active Problem List   Diagnosis Date Noted   Recurrent breast cancer, left (HCC) 03/12/2023   Atrophy of vagina 09/12/2020   Bilateral lower extremity edema 09/12/2020   History of ductal carcinoma in situ of breast 09/12/2020  Hyperglycemia due to type 2 diabetes mellitus (HCC) 09/12/2020   Knee pain 09/12/2020   Long term (current) use of insulin (HCC) 09/12/2020   Lumbosacral spondylosis without myelopathy 09/12/2020   Mixed hyperlipidemia 09/12/2020   Obstructive sleep apnea syndrome 09/12/2020   Ovarian cyst 09/12/2020   Overweight 09/12/2020   Personal history of malignant neoplasm of breast 09/12/2020   Postmenopausal bleeding 09/12/2020   Pure hypercholesterolemia 09/12/2020   Sciatica 09/12/2020   Morbid obesity (HCC) 09/12/2020   Chemotherapy-induced peripheral neuropathy (HCC) 01/22/2018   Encounter  for antineoplastic chemotherapy 06/07/2017   Port-A-Cath in place 05/24/2017   Genetic testing 05/23/2017   Malignant neoplasm of upper-outer quadrant of left breast in female, estrogen receptor negative (HCC) 04/22/2017   Essential hypertension 01/30/2015   Diabetes mellitus (HCC) 01/30/2015    REFERRING PROVIDER: Dr. Donell Beers    REFERRING DIAG:  Diagnosis  C50.412,Z17.1 (ICD-10-CM) - Malignant neoplasm of upper-outer quadrant of left breast in female, estrogen receptor negative (HCC)    THERAPY DIAG:  Malignant neoplasm of upper-outer quadrant of left breast in female, estrogen receptor negative (HCC)  Lymphedema, not elsewhere classified  Stiffness of left shoulder, not elsewhere classified  Stiffness of right shoulder, not elsewhere classified  Status post mastectomy, bilateral  ONSET DATE: 12/2022  Rationale for Evaluation and Treatment: Rehabilitation  SUBJECTIVE:                                                                                                                                                                                           SUBJECTIVE STATEMENT:  Nothing new.  I like my puff balls for my breast, they are not as heavy.  The Left chest seems to be more full than the Rt but it is still fluid in there.    PERTINENT HISTORY: New/recurrent breast cancer on the left. 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 Port placement and bil mastectomy 03/12/23 with removal of 2 negative nodes. Treatment in 2018 with lumpectomy, SLNB, chemo and XRT.  1 negative node removed in 2018.   aspirarted frorm Rt and 150 from Lt 04/16/23. Seroma catheters may be needed. Will be doing chemo again.    PAIN:  Are you having pain? The pain is worse when she lies down and moves, but is better when she sits still.   PRECAUTIONS: Lt lymphedema   RED FLAGS: None   WEIGHT BEARING RESTRICTIONS: No  FALLS:  Has patient fallen in last 6 months? No  LIVING ENVIRONMENT: Lives  with: lives with their family and lives with their spouse  OCCUPATION: Retired   LEISURE: nothing really,  get back to water aerobics.  HAND DOMINANCE: right   PRIOR LEVEL OF FUNCTION: Independent  PATIENT GOALS: get more movement back    OBJECTIVE:  COGNITION: Overall cognitive status: Within functional limits for tasks assessed   PALPATION: sloshing full chest bilateral chest wall with return of seroma - note sent to Dr. Donell Beers   OBSERVATIONS / OTHER ASSESSMENTS: Left incision well healed - Rt incision has a small place covered with a band-aid that is still open and draining - white tissue present.   Lt back of hand and wrist appear larger with less visible landmarks  SENSATION: Numbness left tricep region.   POSTURE: rounded shoulders, forward head    UPPER EXTREMITY AROM/PROM:  A/PROM RIGHT   eval  RIGHT  05/07/2023  Shoulder extension 50   Shoulder flexion 115 - incision 168  Shoulder abduction 90 - pulls in incision 157 no pulling   Shoulder internal rotation    Shoulder external rotation 75     (Blank rows = not tested)  A/PROM LEFT   eval LEFT 05/07/2023  Shoulder extension 50   Shoulder flexion 117 - feels cording like arm pull but not visible 152 ( no pulling)   Shoulder abduction 85 148  Shoulder internal rotation    Shoulder external rotation 75     (Blank rows = not tested)  UPPER EXTREMITY STRENGTH:   LYMPHEDEMA ASSESSMENTS:   LANDMARK RIGHT  eval  At axilla    15 cm proximal to olecranon process 39.7  10 cm proximal to olecranon process 38.7  Olecranon process 32.5  15 cm proximal to ulnar styloid process 27.3  10 cm proximal to ulnar styloid process 25.1  Just proximal to ulnar styloid process 18  Across hand at thumb web space 20.5  At base of 2nd digit 6.1  (Blank rows = not tested)  LANDMARK LEFT  eval  At axilla    15 cm proximal to olecranon process 40.5  10 cm proximal to olecranon process 40.5  Olecranon process 36.5  15  cm proximal to ulnar styloid process 29.2  10 cm proximal to ulnar styloid process 25.8  Just proximal to ulnar styloid process 18.8  Across hand at thumb web space 20.3  At base of 2nd digit 5.8  (Blank rows = not tested) 10cm  Rt: 2981 Lt: 3395 difference  QUICK DASH SURVEY: 45%   TODAY'S TREATMENT:                                                                                                                                          DATE:  05/27/23 In supine: Short neck, superficial and deep abdominals and diaphragmatic breaths, bil axillary nodes and establishment of interaxillary pathway, then L UE working proximal to distal, moving fluid from upper inner arm outwards, and doing both sides of forearm moving fluid towards pathways spending extra time in any areas of fibrosis then retracing all  steps. Cocoa butter used over fingers and hand.   05/21/23 In supine: Short neck, superficial and deep abdominals and diaphragmatic breaths, bil axillary nodes and establishment of interaxillary pathway, then L UE working proximal to distal, moving fluid from upper inner arm outwards, and doing both sides of forearm moving fluid towards pathways spending extra time in any areas of fibrosis then retracing all steps  Supine dowel flexion x 10  Supine chest stretch 2x15"   05/14/23 Pt had her first infusion last week and noticed increased edema in the Lt UE with visible edema in the back of her hand, but already improving.   [facility] with pt in room to see about the sleeve and they sounded like she would have to self reimburseat a total of $350 Showed pt how she could order online for much less and how I would check with 1-2 more places for potential ordering before she self orders.   Also printed out a new [employer] glove info sheet as well as this glove is only $50.  Showed pt finger bandaging with use of tg soft medium - applied this and then educated patient and she  performed x 2.  Cueing and changes as needed.  Written handout given.  Education on how it should feel and when to remove it.   Extended POC due to worsening edema.   05/07/2023 Adjusted foam to fit inside front pockets on each side to hopefully help keep foam in place at fullness on chest. "Sloshing" still evident today especailly on left side.  Began with cervical, upper thoracis and UE ROM in sitting.  Pt able to do 5 sit to stands with no arm push up. Encouraged pt to continue with this at home. To supine for supine dowel exercise for bilateral shoulder flexion, manual PROM with gentle strech , MLD to chest and arm, same performed to each side. Then to sidelying for more MLD to back and lateral chest on each side.  Remeasured arm ROM with significant imrpovment since eval   04/30/23 Made 2 large foam rectangles with 1/2" gray foam to place over seroma areas now that incision is healed and protected.  Supine wearing compression: bil shoulder PROM flexion, abduction, ER MLD short neck, bil shoulder collectors, superficial and deep abdominals with resisted breathing, Lt axilloinguinal anastamosis and then Lt UE from proximal to distal and then in reverse with education on lymphadema.   04/23/23 Eval performed Pt seems to have some baseline Lt UE lymphedema from prior treatment of her triple negative cancer or with her new onset of cancer.   Education on sleeve types to consider with pt wanting a black color Measured pt for sigvaris secure arm sleeve size L3; large average length and size medium glove.  She will start with fabric in between flat knit and circular knit but may need to get a flat knit custom if she is not contained.   Order will be submitted to [facility] today by therapist.   Education on post op stretches to start with demo and education for each - added wall walking into flexion as an alternative for hands clasped flexion  PATIENT EDUCATION:  Education details: per today's note Person  educated: Patient Education method: Chief Technology Officer Education comprehension: verbalized understanding, returned demonstration, and needs further education  HOME EXERCISE PROGRAM: Post op breast with wall flexion as an option instead of hands clasped - pt aware this may change with drain placement.    ASSESSMENT:  CLINICAL  IMPRESSION: Sleeve order not yet in portal.  Will keep checking.  Continued with MLD and compression for the Lt UE.   OBJECTIVE IMPAIRMENTS: decreased activity tolerance, decreased knowledge of condition, decreased knowledge of use of DME, decreased ROM, decreased strength, and pain.   ACTIVITY LIMITATIONS: carrying, lifting, and reach over head  PARTICIPATION LIMITATIONS: cleaning, laundry, and community activity  PERSONAL FACTORS: Time since onset of injury/illness/exacerbation and 3+ comorbidities: radiation hx to axilla, SLNB, disease recurrence.    are also affecting patient's functional outcome.   REHAB POTENTIAL: Good  CLINICAL DECISION MAKING: Evolving/moderate complexity  EVALUATION COMPLEXITY: Moderate  GOALS: Goals reviewed with patient? Yes  SHORT TERM GOALS: Target date: 05/07/23  Pt will be educated on lymphedema - physiology, healthy habits, importance of treatment, and treatment options  Baseline: Goal status: INITIAL   LONG TERM GOALS: Target date: 05/28/23  Pt will obtain compression sleeve and glove for use during treatment with education on use Baseline:  Goal status: INITIAL  2.  Pt will improve bil shoulder reach to [facility] to allow for reach into the cabinets Baseline:  Goal status: INITIAL  3.  Pt will be ind with self MLD for the Lt UE  Baseline:  Goal status: INITIAL  4.  Pt will decrease QDASH to 15% or less to demonstrate improved mobility in the UE Baseline:  Goal status: INITIAL  5.  Pt will be ind with final HEP Baseline:  Goal status: INITIAL   PLAN:  PT FREQUENCY: 1-2x/week  PT DURATION: other: 5  weeks   PLANNED INTERVENTIONS: Therapeutic exercises, Neuromuscular re-education, Patient/Family education, Self Care, DME instructions, Manual therapy, and Re-evaluation  PLAN FOR NEXT SESSION: try fingers bandaging and tg soft? How is swelling? Order anything?  education on lymphedema - principles of care, physiology, etc, risk reduction and healthy practices.  Bil shoulder AAROM/PROM as able , MLD, may need full CDT  Added: 05/01/23: no coverage at [facility], faxed order to [facility] due to [employer] [place] 05/06/23: from [facility]: she would have to pay OOP but then could get reimbursed by her [employer]? Will let know if pt would like to do this 05/14/23: called [facility] and no coverage is guaranteed so she will not order there.  PT will check and send 1 more place before pt self orders.  She has ordering info.  05/16/23 - entered into [employer] portal    Idamae Lusher, PT 05/27/2023, 11:48 AM Reader

## 2023-05-29 MED FILL — Dexamethasone Sodium Phosphate Inj 100 MG/10ML: INTRAMUSCULAR | Qty: 1 | Status: AC

## 2023-05-30 ENCOUNTER — Inpatient Hospital Stay: Payer: Medicare Other | Attending: Hematology and Oncology

## 2023-05-30 ENCOUNTER — Encounter: Payer: Self-pay | Admitting: *Deleted

## 2023-05-30 ENCOUNTER — Inpatient Hospital Stay (HOSPITAL_BASED_OUTPATIENT_CLINIC_OR_DEPARTMENT_OTHER): Payer: Medicare Other | Attending: Hematology and Oncology | Admitting: Adult Health

## 2023-05-30 ENCOUNTER — Encounter: Payer: Self-pay | Admitting: Adult Health

## 2023-05-30 ENCOUNTER — Inpatient Hospital Stay: Payer: BC Managed Care – PPO

## 2023-05-30 VITALS — BP 134/71 | HR 101 | Temp 97.8°F | Resp 18 | Ht 64.0 in | Wt 225.1 lb

## 2023-05-30 DIAGNOSIS — C50412 Malignant neoplasm of upper-outer quadrant of left female breast: Secondary | ICD-10-CM | POA: Insufficient documentation

## 2023-05-30 DIAGNOSIS — G620 Drug-induced polyneuropathy: Secondary | ICD-10-CM | POA: Insufficient documentation

## 2023-05-30 DIAGNOSIS — Z171 Estrogen receptor negative status [ER-]: Secondary | ICD-10-CM | POA: Diagnosis not present

## 2023-05-30 LAB — CMP (CANCER CENTER ONLY)
ALT: 33 U/L (ref 0–44)
AST: 41 U/L (ref 15–41)
Albumin: 3.8 g/dL (ref 3.5–5.0)
Alkaline Phosphatase: 66 U/L (ref 38–126)
Anion gap: 10 (ref 5–15)
BUN: 35 mg/dL — ABNORMAL HIGH (ref 8–23)
CO2: 25 mmol/L (ref 22–32)
Calcium: 9.2 mg/dL (ref 8.9–10.3)
Chloride: 104 mmol/L (ref 98–111)
Creatinine: 1.02 mg/dL — ABNORMAL HIGH (ref 0.44–1.00)
GFR, Estimated: >60 mL/min (ref >60)
Glucose, Bld: 246 mg/dL — ABNORMAL HIGH (ref 70–99)
Potassium: 3.8 mmol/L (ref 3.5–5.1)
Sodium: 139 mmol/L (ref 135–145)
Total Bilirubin: 0.4 mg/dL (ref 0.3–1.2)
Total Protein: 7 g/dL (ref 6.5–8.1)

## 2023-05-30 LAB — CBC WITH DIFFERENTIAL (CANCER CENTER ONLY)
Abs Immature Granulocytes: 0.12 10*3/uL — ABNORMAL HIGH (ref 0.00–0.07)
Basophils Absolute: 0 10*3/uL (ref 0.0–0.1)
Basophils Relative: 1 %
Eosinophils Absolute: 0.1 10*3/uL (ref 0.0–0.5)
Eosinophils Relative: 1 %
HCT: 35.4 % — ABNORMAL LOW (ref 36.0–46.0)
Hemoglobin: 11.2 g/dL — ABNORMAL LOW (ref 12.0–15.0)
Immature Granulocytes: 2 %
Lymphocytes Relative: 35 %
Lymphs Abs: 2.1 10*3/uL (ref 0.7–4.0)
MCH: 28.1 pg (ref 26.0–34.0)
MCHC: 31.6 g/dL (ref 30.0–36.0)
MCV: 88.7 fL (ref 80.0–100.0)
Monocytes Absolute: 0.8 10*3/uL (ref 0.1–1.0)
Monocytes Relative: 13 %
Neutro Abs: 2.9 10*3/uL (ref 1.7–7.7)
Neutrophils Relative %: 48 %
Platelet Count: 210 10*3/uL (ref 150–400)
RBC: 3.99 MIL/uL (ref 3.87–5.11)
RDW: 13.7 % (ref 11.5–15.5)
WBC Count: 5.9 10*3/uL (ref 4.0–10.5)
nRBC: 0 % (ref 0.0–0.2)

## 2023-05-30 NOTE — Assessment & Plan Note (Addendum)
04/30/2017: Left lumpectomy: IDC grade 3, 1.7 cm, DCIS, lymphovascular invasion present, margins negative, 0/1 lymph node negative, ER 0%, PR 0%, HER-2 negative ratio 1.37, Ki-67 40%, T1c N0 stage IB    Recommendation: 1. adjuvant chemotherapy with dose dense Adriamycin and Cytoxan 4 followed by Taxol weekly 6 discontinued for neuropathy 2. 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  This is especially worse at night.   Severe fatigue: exercizing. Obesity: Encouraged her to work on her diet to lose some weight. Patient's comorbidities for neuropathy also includes diabetes.  I stressed the importance of controlling her weight and blood sugars.   Breast cancer recurrence: mammogram 12/10/2022: Asymmetry/distortion left breast, ultrasound: 2 adjacent irregular masses 2 o'clock position left breast 1.9 cm and 1.2 cm, no suspicious lymph nodes Left breast biopsy 2:00 posterior: Grade 2 IDC with DCIS Left breast biopsy 2:00 anterior: Grade 2 IDC with DCIS ER 0%, PR 0%, HER2 0, Ki-67 30% 01/31/2023: Bone scan: Negative, CT CAP 01/26/2023: Negative for metastatic disease but nodular contour of the liver suggestive of cirrhosis 01/26/2023: Ultrasound left submandibular neck: Normal lymph node 1.2 cm 01/24/2023: MRI breast: The recently biopsied area measured 6.1 cm, skin thickening (post radiation)   Recommendation: 03/12/2023: Bilateral mastectomies: Right breast: Benign left mastectomy: Grade 3 IDC 6 cm with high-grade DCIS, margins negative, 1 intramammary lymph node negative, 0/2 sentinel lymph nodes, ER 0%, PR 0%, HER2 0, Ki-67 30% Followed by adjuvant chemotherapy with CMF x 6 cycles started  05/09/2023 ------------------------------------------------------------------------------------------------------------------------------------------------ Current treatment: Cycle 2 CMF  She does not want to receive treatment today and instead wants to return in 2 weeks after her trip.  I reviewed with her that she had triple negative breast cancer that was aggressive and we are treating her with curative intent.  I conveyed my concern in the risk associated with delaying treatment.    Abygail appreciated the information and verbalized understanding, however understandably wants to enjoy her trip and tells me she will return on 11/4 for labs, f/u, and treatment.

## 2023-05-30 NOTE — Progress Notes (Signed)
[facility] Cancer Follow up:    Audrey Croissant, MD [address] [place] [address]   DIAGNOSIS:  Cancer Staging  Malignant neoplasm of upper-outer quadrant of left breast in female, estrogen receptor negative (HCC) Staging form: Breast, AJCC 8th Edition - Clinical: No stage assigned - Unsigned - Pathologic: Stage IB (pT1c, pN0, cM0, G3, ER-, PR-, HER2-) - Signed by Lonie Peak, MD on 05/14/2017 Histologic grading system: 3 grade system   SUMMARY OF ONCOLOGIC HISTORY: Oncology History  Malignant neoplasm of upper-outer quadrant of left breast in female, estrogen receptor negative (HCC)  04/05/2017 Initial Diagnosis   Left breast asymmetry by ultrasound measured 1.3 cm at 2:30 position 10 cm from nipple, no axillary lymph nodes; biopsy IDC grade 2, ER 0%, PR 0%, HER-2 negative ratio 1.37, Ki-67 40%, T1c N0 stage IB AJCC 8    04/30/2017 Surgery   Left lumpectomy: IDC grade 3, 1.7 cm, DCIS, lymphovascular invasion present, margins negative, 0/1 lymph node negative, ER 0%, PR 0%, HER-2 negative ratio 1.37, Ki-67 40%, T1c N0 stage IB   05/22/2017 Genetic Testing   Patient had genetic testing due to a personal history of triple negative breast cancer.  The Common Hereditary Cancer Panel was ordered. The Hereditary Gene Panel offered by [facility] includes sequencing and/or deletion duplication testing of the following 46 genes: APC, ATM, AXIN2, BARD1, BMPR1A, BRCA1, BRCA2, BRIP1, CDH1, CDKN2A (p14ARF), CDKN2A (p16INK4a), CHEK2, CTNNA1, DICER1, EPCAM (Deletion/duplication testing only), GREM1 (promoter region deletion/duplication testing only), KIT, MEN1, MLH1, MSH2, MSH3, MSH6, MUTYH, NBN, NF1, NHTL1, PALB2, PDGFRA, PMS2, POLD1, POLE, PTEN, RAD50, RAD51C, RAD51D, SDHB, SDHC, SDHD, SMAD4, SMARCA4. STK11, TP53, TSC1, TSC2, and VHL.  The following genes were evaluated for sequence changes only: SDHA and HOXB13 c.251G>A variant only.    Results: No pathogenic mutations  identified.  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  The date of this test report is 05/22/2017.    05/24/2017 - 08/30/2017 Chemotherapy   Dose dense Adriamycin and Cytoxan 4 followed by Taxol weekly 5 (stopped early for neuropathy)    09/26/2017 - 10/22/2017 Radiation Therapy   Adjuvant radiation therapy   12/10/2022 Relapse/Recurrence   Mammogram detected distortion, 2 irregular masses 1.9 cm and 1.2 cm: Biopsy grade 2 IDC with DCIS triple negative, scans negative, MRI breast: 6.1 cm   03/12/2023 Surgery   Bilateral mastectomies: Right breast: Benign  left mastectomy: Grade 3 IDC 6 cm with high-grade DCIS, margins negative, 1 intramammary lymph node negative, 0/2 sentinel lymph nodes, ER 0%, PR 0%, HER2 0, Ki-67 30%   05/09/2023 -  Chemotherapy   Patient is on Treatment Plan : BREAST Adjuvant CMF IV q21d       CURRENT THERAPY: CMF  INTERVAL HISTORY: Audrey Peters 67 y.o. female returns for follow-up and evaluation prior to receiving her second cycle of CMF chemotherapy.  She tells me that she had some swelling in her left hand and nausea after the chemo and also struggled with her blood sugars increasing to higher than normal.  She is concerned about receiving treatment today because she has a trip planned later this weekend and is concerned she will not enjoy it.  She tells me that she wants to return on 11/4 for her treatment.  She denies any new issues and otherwise is feeling well.     Patient Active Problem List   Diagnosis Date Noted   Recurrent breast cancer, left (HCC) 03/12/2023   Atrophy of vagina 09/12/2020  Bilateral lower extremity edema 09/12/2020   History of ductal carcinoma in situ of breast 09/12/2020   Hyperglycemia due to type 2 diabetes mellitus (HCC) 09/12/2020   Knee pain 09/12/2020   Long term (current) use of insulin (HCC) 09/12/2020   Lumbosacral spondylosis without myelopathy 09/12/2020   Mixed hyperlipidemia 09/12/2020   Obstructive  sleep apnea syndrome 09/12/2020   Ovarian cyst 09/12/2020   Overweight 09/12/2020   Personal history of malignant neoplasm of breast 09/12/2020   Postmenopausal bleeding 09/12/2020   Pure hypercholesterolemia 09/12/2020   Sciatica 09/12/2020   Morbid obesity (HCC) 09/12/2020   Chemotherapy-induced peripheral neuropathy (HCC) 01/22/2018   Encounter for antineoplastic chemotherapy 06/07/2017   Port-A-Cath in place 05/24/2017   Genetic testing 05/23/2017   Malignant neoplasm of upper-outer quadrant of left breast in female, estrogen receptor negative (HCC) 04/22/2017   Essential hypertension 01/30/2015   Diabetes mellitus (HCC) 01/30/2015    is allergic to canagliflozin, empagliflozin, other, sulfa antibiotics, and ciprofloxacin.  MEDICAL HISTORY: Past Medical History:  Diagnosis Date   Anemia yrs ago   Arthritis    Breast cancer (HCC)    Cancer (HCC)    recent dx in breast   Carpal tunnel syndrome of right wrist    Diabetes mellitus without complication (HCC)    dx 2008   Headache    sinus   Hypertension    Peripheral neuropathy 2024   Hands and Feet   Personal history of chemotherapy    Personal history of radiation therapy    Vaginal delivery 1983    SURGICAL HISTORY: Past Surgical History:  Procedure Laterality Date   BREAST BIOPSY     BREAST BIOPSY Left 01/01/2023   [place] LT BREAST BX W LOC DEV 1ST LESION IMG BX SPEC US GUIDE 01/01/2023 [facility]   BREAST BIOPSY Left 01/01/2023   [place] LT BREAST BX W LOC DEV EA ADD LESION IMG BX SPEC US GUIDE 01/01/2023 [facility]   BREAST LUMPECTOMY Left    BREAST LUMPECTOMY WITH RADIOACTIVE SEED AND SENTINEL LYMPH NODE BIOPSY Left 04/30/2017   Procedure: LEFT BREAST LUMPECTOMY WITH RADIOACTIVE SEED AND LEFT SENTINEL LYMPH NODE BIOPSY ERAS PATHWAY;  Surgeon: Harriette Bouillon, MD;  Location: [facility];  Service: General;  Laterality: Left;   COLONOSCOPY WITH PROPOFOL N/A 05/28/2016   Procedure: COLONOSCOPY WITH PROPOFOL;   Surgeon: Charolett Bumpers, MD;  Location: [facility];  Service: Endoscopy;  Laterality: N/A;   DILATATION & CURETTAGE/HYSTEROSCOPY WITH MYOSURE N/A 04/20/2020   Procedure: DILATATION & CURETTAGE/HYSTEROSCOPY WITH MYOSURE;  Surgeon: Myna Hidalgo, DO;  Location: [facility];  Service: Gynecology;  Laterality: N/A;   DILATION AND CURETTAGE OF UTERUS     HYSTEROSCOPY WITH D & C N/A 07/21/2015   Procedure: DILATATION AND CURETTAGE /HYSTEROSCOPY with myosure;  Surgeon: Myna Hidalgo, DO;  Location: [facility];  Service: Gynecology;  Laterality: N/A;   MASTECTOMY W/ SENTINEL NODE BIOPSY Left 03/12/2023   Procedure: LEFT MASTECTOMY WITH SENTINEL LYMPH NODE BIOPSY;  Surgeon: Almond Lint, MD;  Location: [facility];  Service: General;  Laterality: Left;   PORTACATH PLACEMENT Right 04/30/2017   Procedure: INSERTION PORT-A-CATH;  Surgeon: Harriette Bouillon, MD;  Location: [facility];  Service: General;  Laterality: Right;   PORTACATH PLACEMENT N/A 03/12/2023   Procedure: PORT PLACEMENT WITH ULTRASOUND GUIDANCE;  Surgeon: Almond Lint, MD;  Location: [name];  Service: General;  Laterality: N/A;   SIMPLE MASTECTOMY WITH AXILLARY SENTINEL NODE BIOPSY Right 03/12/2023   Procedure: RIGHT MASTECTOMY;  Surgeon: Almond Lint, MD;  Location: [facility];  Service: General;  Laterality: Right;    SOCIAL HISTORY: Social History   Socioeconomic History   Marital status: Married    Spouse name: Not on file   Number of children: Not on file   Years of education: Not on file   Highest education level: Not on file  Occupational History   Not on file  Tobacco Use   Smoking status: Never   Smokeless tobacco: Never  Vaping Use   Vaping status: Never Used  Substance and Sexual Activity   Alcohol use: Yes    Alcohol/week: 0.0 standard drinks of alcohol    Comment: occ wine   Drug use: No   Sexual activity: Not on file  Other Topics Concern   Not on file   Social History Narrative   Not on file   Social Determinants of Health   Financial Resource Strain: Not on file  Food Insecurity: Not on file  Transportation Needs: Not on file  Physical Activity: Not on file  Stress: Not on file  Social Connections: Not on file  Intimate Partner Violence: Not on file    FAMILY HISTORY: Family History  Problem Relation Age of Onset   Diabetes Mother    Hypertension Mother     Review of Systems  Constitutional:  Positive for fatigue. Negative for appetite change, chills, fever and unexpected weight change.  HENT:   Negative for hearing loss, lump/mass and trouble swallowing.   Eyes:  Negative for eye problems and icterus.  Respiratory:  Negative for chest tightness, cough and shortness of breath.   Cardiovascular:  Negative for chest pain, leg swelling and palpitations.  Gastrointestinal:  Positive for nausea. Negative for abdominal distention, abdominal pain, constipation, diarrhea and vomiting.  Endocrine: Negative for hot flashes.  Genitourinary:  Negative for difficulty urinating.   Musculoskeletal:  Negative for arthralgias.  Skin:  Negative for itching and rash.  Neurological:  Negative for dizziness, extremity weakness, headaches and numbness.  Hematological:  Negative for adenopathy. Does not bruise/bleed easily.  Psychiatric/Behavioral:  Negative for depression. The patient is not nervous/anxious.       PHYSICAL EXAMINATION     Vitals:   05/30/23 0935  BP: 134/71  Pulse: (!) 101  Resp: 18  Temp: 97.8 F (36.6 C)  SpO2: 97%    Physical Exam Constitutional:      General: She is not in acute distress.    Appearance: Normal appearance. She is not toxic-appearing.  HENT:     Head: Normocephalic and atraumatic.     Mouth/Throat:     Mouth: Mucous membranes are moist.     Pharynx: Oropharynx is clear. No oropharyngeal exudate or posterior oropharyngeal erythema.  Eyes:     General: No scleral icterus. Cardiovascular:      Rate and Rhythm: Normal rate and regular rhythm.     Pulses: Normal pulses.     Heart sounds: Normal heart sounds.  Pulmonary:     Effort: Pulmonary effort is normal.     Breath sounds: Normal breath sounds.  Abdominal:     General: Abdomen is flat. Bowel sounds are normal. There is no distension.     Palpations: Abdomen is soft.     Tenderness: There is no abdominal tenderness.  Musculoskeletal:        General: No swelling.     Cervical back: Neck supple.  Lymphadenopathy:     Cervical: No cervical adenopathy.  Skin:  General: Skin is warm and dry.     Findings: No rash.  Neurological:     General: No focal deficit present.     Mental Status: She is alert.  Psychiatric:        Mood and Affect: Mood normal.        Behavior: Behavior normal.     LABORATORY DATA:  CBC    Component Value Date/Time   WBC 5.9 05/30/2023 0915   WBC 3.1 (L) 09/13/2017 0923   RBC 3.99 05/30/2023 0915   HGB 11.2 (L) 05/30/2023 0915   HGB 9.0 (L) 08/16/2017 0945   HCT 35.4 (L) 05/30/2023 0915   HCT 27.5 (L) 08/16/2017 0945   PLT 210 05/30/2023 0915   PLT 183 08/16/2017 0945   MCV 88.7 05/30/2023 0915   MCV 88.7 08/16/2017 0945   MCH 28.1 05/30/2023 0915   MCHC 31.6 05/30/2023 0915   RDW 13.7 05/30/2023 0915   RDW 15.4 (H) 08/16/2017 0945   LYMPHSABS 2.1 05/30/2023 0915   LYMPHSABS 0.9 08/16/2017 0945   MONOABS 0.8 05/30/2023 0915   MONOABS 0.4 08/16/2017 0945   EOSABS 0.1 05/30/2023 0915   EOSABS 0.0 08/16/2017 0945   BASOSABS 0.0 05/30/2023 0915   BASOSABS 0.0 08/16/2017 0945    CMP     Component Value Date/Time   NA 139 05/30/2023 0915   NA 138 08/16/2017 0945   K 3.8 05/30/2023 0915   K 3.2 (L) 08/16/2017 0945   CL 104 05/30/2023 0915   CO2 25 05/30/2023 0915   CO2 23 08/16/2017 0945   GLUCOSE 246 (H) 05/30/2023 0915   GLUCOSE 200 (H) 08/16/2017 0945   BUN 35 (H) 05/30/2023 0915   BUN 9.0 08/16/2017 0945   CREATININE 1.02 (H) 05/30/2023 0915   CREATININE 0.8  08/16/2017 0945   CALCIUM 9.2 05/30/2023 0915   CALCIUM 7.5 (L) 08/16/2017 0945   PROT 7.0 05/30/2023 0915   PROT 6.5 08/16/2017 0945   ALBUMIN 3.8 05/30/2023 0915   ALBUMIN 3.0 (L) 08/16/2017 0945   AST 41 05/30/2023 0915   AST 48 (H) 08/16/2017 0945   ALT 33 05/30/2023 0915   ALT 28 08/16/2017 0945   ALKPHOS 66 05/30/2023 0915   ALKPHOS 62 08/16/2017 0945   BILITOT 0.4 05/30/2023 0915   BILITOT 0.58 08/16/2017 0945   GFRNONAA >60 05/30/2023 0915   GFRAA >60 04/14/2020 1200       ASSESSMENT and THERAPY PLAN:   Malignant neoplasm of upper-outer quadrant of left breast in female, estrogen receptor negative (HCC) 04/30/2017: Left lumpectomy: IDC grade 3, 1.7 cm, DCIS, lymphovascular invasion present, margins negative, 0/1 lymph node negative, ER 0%, PR 0%, HER-2 negative ratio 1.37, Ki-67 40%, T1c N0 stage IB    Recommendation: 1. adjuvant chemotherapy with dose dense Adriamycin and Cytoxan 4 followed by Taxol weekly 6 discontinued for neuropathy 2. 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  This is especially worse at night.   Severe fatigue: exercizing. Obesity: Encouraged her to work on her diet to lose some weight. Patient's comorbidities for neuropathy also includes diabetes.  I stressed the importance of controlling her weight and blood sugars.   Breast cancer recurrence: mammogram 12/10/2022: Asymmetry/distortion left breast, ultrasound: 2 adjacent irregular masses 2 o'clock position left breast 1.9 cm and 1.2 cm, no suspicious lymph nodes Left breast biopsy 2:00 posterior: Grade 2 IDC with DCIS Left breast biopsy 2:00 anterior: Grade 2 IDC with DCIS ER 0%, PR 0%, HER2  0, Ki-67 30% 01/31/2023: Bone scan: Negative, CT CAP 01/26/2023: Negative for metastatic disease but nodular  contour of the liver suggestive of cirrhosis 01/26/2023: Ultrasound left submandibular neck: Normal lymph node 1.2 cm 01/24/2023: MRI breast: The recently biopsied area measured 6.1 cm, skin thickening (post radiation)   Recommendation: 03/12/2023: Bilateral mastectomies: Right breast: Benign left mastectomy: Grade 3 IDC 6 cm with high-grade DCIS, margins negative, 1 intramammary lymph node negative, 0/2 sentinel lymph nodes, ER 0%, PR 0%, HER2 0, Ki-67 30% Followed by adjuvant chemotherapy with CMF x 6 cycles started 05/09/2023 ------------------------------------------------------------------------------------------------------------------------------------------------ Current treatment: Cycle 2 CMF  She does not want to receive treatment today and instead wants to return in 2 weeks after her trip.  I reviewed with her that she had triple negative breast cancer that was aggressive and we are treating her with curative intent.  I conveyed my concern in the risk associated with delaying treatment.    Onyinyechukwu appreciated the information and verbalized understanding, however understandably wants to enjoy her trip and tells me she will return on 11/4 for labs, f/u, and treatment.    All questions were answered. The patient knows to call the clinic with any problems, questions or concerns. We can certainly see the patient much sooner if necessary.  Total encounter time:30 minutes*in face-to-face visit time, chart review, lab review, care coordination, order entry, and documentation of the encounter time.    Lillard Anes, NP 05/31/23 8:30 PM Medical Oncology and Hematology [facility] [address] [place], [place] [address] Tel. [phone]    Fax. [phone]  *Total Encounter Time as defined by the Centers for Medicare and Medicaid Services includes, in addition to the face-to-face time of a patient visit (documented in the note above) non-face-to-face time: obtaining and  reviewing outside history, ordering and reviewing medications, tests or procedures, care coordination (communications with other health care professionals or caregivers) and documentation in the medical record.

## 2023-05-31 ENCOUNTER — Encounter: Payer: Self-pay | Admitting: Hematology and Oncology

## 2023-06-04 ENCOUNTER — Encounter: Payer: Self-pay | Admitting: Rehabilitation

## 2023-06-04 ENCOUNTER — Ambulatory Visit: Payer: Medicare Other | Admitting: Rehabilitation

## 2023-06-04 DIAGNOSIS — M25612 Stiffness of left shoulder, not elsewhere classified: Secondary | ICD-10-CM

## 2023-06-04 DIAGNOSIS — Z171 Estrogen receptor negative status [ER-]: Secondary | ICD-10-CM | POA: Diagnosis not present

## 2023-06-04 DIAGNOSIS — C50412 Malignant neoplasm of upper-outer quadrant of left female breast: Secondary | ICD-10-CM | POA: Diagnosis not present

## 2023-06-04 DIAGNOSIS — Z9013 Acquired absence of bilateral breasts and nipples: Secondary | ICD-10-CM

## 2023-06-04 DIAGNOSIS — I890 Lymphedema, not elsewhere classified: Secondary | ICD-10-CM | POA: Diagnosis not present

## 2023-06-04 DIAGNOSIS — M25611 Stiffness of right shoulder, not elsewhere classified: Secondary | ICD-10-CM

## 2023-06-04 NOTE — Therapy (Signed)
OUTPATIENT PHYSICAL THERAPY  UPPER EXTREMITY ONCOLOGY TREATMENT  Patient Name: Audrey Peters MRN: [id number] DOB:December 01, 1955, 67 y.o., female Today's Date: 06/04/2023  END OF SESSION:  PT End of Session - 06/04/23 0909     Visit Number 7    Number of Visits 14    Date for PT Re-Evaluation 06/17/23    PT Start Time 0910    PT Stop Time 0951    PT Time Calculation (min) 41 min    Activity Tolerance Patient tolerated treatment well    Behavior During Therapy [facility] for tasks assessed/performed              Past Medical History:  Diagnosis Date   Anemia yrs ago   Arthritis    Breast cancer (HCC)    Cancer (HCC)    recent dx in breast   Carpal tunnel syndrome of right wrist    Diabetes mellitus without complication (HCC)    dx 2008   Headache    sinus   Hypertension    Peripheral neuropathy 2024   Hands and Feet   Personal history of chemotherapy    Personal history of radiation therapy    Vaginal delivery 1983   Past Surgical History:  Procedure Laterality Date   BREAST BIOPSY     BREAST BIOPSY Left 01/01/2023   [place] LT BREAST BX W LOC DEV 1ST LESION IMG BX SPEC US GUIDE 01/01/2023 [facility]   BREAST BIOPSY Left 01/01/2023   [place] LT BREAST BX W LOC DEV EA ADD LESION IMG BX SPEC US GUIDE 01/01/2023 [facility]   BREAST LUMPECTOMY Left    BREAST LUMPECTOMY WITH RADIOACTIVE SEED AND SENTINEL LYMPH NODE BIOPSY Left 04/30/2017   Procedure: LEFT BREAST LUMPECTOMY WITH RADIOACTIVE SEED AND LEFT SENTINEL LYMPH NODE BIOPSY ERAS PATHWAY;  Surgeon: Harriette Bouillon, MD;  Location: [facility];  Service: General;  Laterality: Left;   COLONOSCOPY WITH PROPOFOL N/A 05/28/2016   Procedure: COLONOSCOPY WITH PROPOFOL;  Surgeon: Charolett Bumpers, MD;  Location: [facility];  Service: Endoscopy;  Laterality: N/A;   DILATATION & CURETTAGE/HYSTEROSCOPY WITH MYOSURE N/A 04/20/2020   Procedure: DILATATION & CURETTAGE/HYSTEROSCOPY WITH MYOSURE;  Surgeon: Myna Hidalgo, DO;  Location:  [facility];  Service: Gynecology;  Laterality: N/A;   DILATION AND CURETTAGE OF UTERUS     HYSTEROSCOPY WITH D & C N/A 07/21/2015   Procedure: DILATATION AND CURETTAGE /HYSTEROSCOPY with myosure;  Surgeon: Myna Hidalgo, DO;  Location: [facility];  Service: Gynecology;  Laterality: N/A;   MASTECTOMY W/ SENTINEL NODE BIOPSY Left 03/12/2023   Procedure: LEFT MASTECTOMY WITH SENTINEL LYMPH NODE BIOPSY;  Surgeon: Almond Lint, MD;  Location: [facility];  Service: General;  Laterality: Left;   PORTACATH PLACEMENT Right 04/30/2017   Procedure: INSERTION PORT-A-CATH;  Surgeon: Harriette Bouillon, MD;  Location: [facility];  Service: General;  Laterality: Right;   PORTACATH PLACEMENT N/A 03/12/2023   Procedure: PORT PLACEMENT WITH ULTRASOUND GUIDANCE;  Surgeon: Almond Lint, MD;  Location: [facility];  Service: General;  Laterality: N/A;   SIMPLE MASTECTOMY WITH AXILLARY SENTINEL NODE BIOPSY Right 03/12/2023   Procedure: RIGHT MASTECTOMY;  Surgeon: Almond Lint, MD;  Location: [facility];  Service: General;  Laterality: Right;   Patient Active Problem List   Diagnosis Date Noted   Recurrent breast cancer, left (HCC) 03/12/2023   Atrophy of vagina 09/12/2020   Bilateral lower extremity edema 09/12/2020   History of ductal carcinoma in situ of breast 09/12/2020  Hyperglycemia due to type 2 diabetes mellitus (HCC) 09/12/2020   Knee pain 09/12/2020   Long term (current) use of insulin (HCC) 09/12/2020   Lumbosacral spondylosis without myelopathy 09/12/2020   Mixed hyperlipidemia 09/12/2020   Obstructive sleep apnea syndrome 09/12/2020   Ovarian cyst 09/12/2020   Overweight 09/12/2020   Personal history of malignant neoplasm of breast 09/12/2020   Postmenopausal bleeding 09/12/2020   Pure hypercholesterolemia 09/12/2020   Sciatica 09/12/2020   Morbid obesity (HCC) 09/12/2020   Chemotherapy-induced peripheral neuropathy (HCC) 01/22/2018   Encounter  for antineoplastic chemotherapy 06/07/2017   Port-A-Cath in place 05/24/2017   Genetic testing 05/23/2017   Malignant neoplasm of upper-outer quadrant of left breast in female, estrogen receptor negative (HCC) 04/22/2017   Essential hypertension 01/30/2015   Diabetes mellitus (HCC) 01/30/2015    REFERRING PROVIDER: Dr. Donell Beers    REFERRING DIAG:  Diagnosis  C50.412,Z17.1 (ICD-10-CM) - Malignant neoplasm of upper-outer quadrant of left breast in female, estrogen receptor negative (HCC)    THERAPY DIAG:  Malignant neoplasm of upper-outer quadrant of left breast in female, estrogen receptor negative (HCC)  Lymphedema, not elsewhere classified  Stiffness of left shoulder, not elsewhere classified  Stiffness of right shoulder, not elsewhere classified  Status post mastectomy, bilateral  ONSET DATE: 12/2022  Rationale for Evaluation and Treatment: Rehabilitation  SUBJECTIVE:                                                                                                                                                                                           SUBJECTIVE STATEMENT:  They took off of the Lt last time and I go back in 2 weeks to check it again.  The Rt side only had 45ml   PERTINENT HISTORY: New/recurrent breast cancer on the left. 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 Port placement and bil mastectomy 03/12/23 with removal of 2 negative nodes. Treatment in 2018 with lumpectomy, SLNB, chemo and XRT.  1 negative node removed in 2018.   aspirarted frorm Rt and 150 from Lt 04/16/23. Seroma catheters may be needed. Will be doing chemo again.    PAIN:  Are you having pain? The pain is worse when she lies down and moves, but is better when she sits still.   PRECAUTIONS: Lt lymphedema   RED FLAGS: None   WEIGHT BEARING RESTRICTIONS: No  FALLS:  Has patient fallen in last 6 months? No  LIVING ENVIRONMENT: Lives with: lives with their family and lives with  their spouse  OCCUPATION: Retired   LEISURE: nothing really,  get back to water aerobics.    HAND DOMINANCE: right   PRIOR  LEVEL OF FUNCTION: Independent  PATIENT GOALS: get more movement back    OBJECTIVE:  COGNITION: Overall cognitive status: Within functional limits for tasks assessed   PALPATION: sloshing full chest bilateral chest wall with return of seroma - note sent to Dr. Donell Beers   OBSERVATIONS / OTHER ASSESSMENTS: Left incision well healed - Rt incision has a small place covered with a band-aid that is still open and draining - white tissue present.   Lt back of hand and wrist appear larger with less visible landmarks  SENSATION: Numbness left tricep region.   POSTURE: rounded shoulders, forward head    UPPER EXTREMITY AROM/PROM:  A/PROM RIGHT   eval  RIGHT  05/07/2023  Shoulder extension 50   Shoulder flexion 115 - incision 168  Shoulder abduction 90 - pulls in incision 157 no pulling   Shoulder internal rotation    Shoulder external rotation 75     (Blank rows = not tested)  A/PROM LEFT   eval LEFT 05/07/2023 06/04/23  Shoulder extension 50    Shoulder flexion 117 - feels cording like arm pull but not visible 152 ( no pulling)  120 - feels tight upper arm / 145 post stretching   Shoulder abduction 85 148 130 - pull in upper arm   Shoulder internal rotation     Shoulder external rotation 75      (Blank rows = not tested)  UPPER EXTREMITY STRENGTH:   LYMPHEDEMA ASSESSMENTS:   LANDMARK RIGHT  eval  At axilla    15 cm proximal to olecranon process 39.7  10 cm proximal to olecranon process 38.7  Olecranon process 32.5  15 cm proximal to ulnar styloid process 27.3  10 cm proximal to ulnar styloid process 25.1  Just proximal to ulnar styloid process 18  Across hand at thumb web space 20.5  At base of 2nd digit 6.1  (Blank rows = not tested)  LANDMARK LEFT  eval  At axilla    15 cm proximal to olecranon process 40.5  10 cm proximal to olecranon  process 40.5  Olecranon process 36.5  15 cm proximal to ulnar styloid process 29.2  10 cm proximal to ulnar styloid process 25.8  Just proximal to ulnar styloid process 18.8  Across hand at thumb web space 20.3  At base of 2nd digit 5.8  (Blank rows = not tested) 10cm  Rt: 2981 Lt: 3395 difference  QUICK DASH SURVEY: 45%   TODAY'S TREATMENT:                                                                                                                                          DATE:  06/04/23 Rechecked ROM Dowel flexion x 5 AROM flexion alternating x 5  Supine c/cc circles with straight arm x 5 each In supine: Short neck, superficial and deep abdominals and diaphragmatic breaths, bil axillary nodes and establishment of  interaxillary pathway, then L UE working proximal to distal, moving fluid from upper inner arm outwards, and doing both sides of forearm moving fluid towards pathways spending extra time in any areas of fibrosis then retracing all steps.    05/27/23 In supine: Short neck, superficial and deep abdominals and diaphragmatic breaths, bil axillary nodes and establishment of interaxillary pathway, then L UE working proximal to distal, moving fluid from upper inner arm outwards, and doing both sides of forearm moving fluid towards pathways spending extra time in any areas of fibrosis then retracing all steps. Cocoa butter used over fingers and hand.   05/21/23 In supine: Short neck, superficial and deep abdominals and diaphragmatic breaths, bil axillary nodes and establishment of interaxillary pathway, then L UE working proximal to distal, moving fluid from upper inner arm outwards, and doing both sides of forearm moving fluid towards pathways spending extra time in any areas of fibrosis then retracing all steps  Supine dowel flexion x 10  Supine chest stretch 2x15"   05/14/23 Pt had her first infusion last week and noticed increased edema in the Lt UE with visible  edema in the back of her hand, but already improving.   [facility]ed [facility] with pt in room to see about the sleeve and they sounded like she would have to self reimburseat a total of $350 Showed pt how she could order online for much less and how I would check with 1-2 more places for potential ordering before she self orders.   Also printed out a new [employer] glove info sheet as well as this glove is only $50.  Showed pt finger bandaging with use of tg soft medium - applied this and then educated patient and she performed x 2.  Cueing and changes as needed.  Written handout given.  Education on how it should feel and when to remove it.   Extended POC due to worsening edema.   05/07/2023 Adjusted foam to fit inside front pockets on each side to hopefully help keep foam in place at fullness on chest. "Sloshing" still evident today especailly on left side.  Began with cervical, upper thoracis and UE ROM in sitting.  Pt able to do 5 sit to stands with no arm push up. Encouraged pt to continue with this at home. To supine for supine dowel exercise for bilateral shoulder flexion, manual PROM with gentle strech , MLD to chest and arm, same performed to each side. Then to sidelying for more MLD to back and lateral chest on each side.  Remeasured arm ROM with significant imrpovment since eval   04/30/23 Made 2 large foam rectangles with 1/2" gray foam to place over seroma areas now that incision is healed and protected.  Supine wearing compression: bil shoulder PROM flexion, abduction, ER MLD short neck, bil shoulder collectors, superficial and deep abdominals with resisted breathing, Lt axilloinguinal anastamosis and then Lt UE from proximal to distal and then in reverse with education on lymphadema.   04/23/23 Eval performed Pt seems to have some baseline Lt UE lymphedema from prior treatment of her triple negative cancer or with her new onset of cancer.   Education on sleeve types to consider with pt  wanting a black color Measured pt for sigvaris secure arm sleeve size L3; large average length and size medium glove.  She will start with fabric in between flat knit and circular knit but may need to get a flat knit custom if she is not contained.  Order will be submitted to [facility] today by therapist.   Education on post op stretches to start with demo and education for each - added wall walking into flexion as an alternative for hands clasped flexion  PATIENT EDUCATION:  Education details: per today's note Person educated: Patient Education method: Chief Technology Officer Education comprehension: verbalized understanding, returned demonstration, and needs further education  HOME EXERCISE PROGRAM: Post op breast with wall flexion as an option instead of hands clasped - pt aware this may change with drain placement.    ASSESSMENT:  CLINICAL IMPRESSION: Sleeve order is now in the portal but she has not heard anything yet. Continued with MLD and compression for the Lt UE. Pt started out with less than normal AROM but this improved to normal after stretching again in clinic.   OBJECTIVE IMPAIRMENTS: decreased activity tolerance, decreased knowledge of condition, decreased knowledge of use of DME, decreased ROM, decreased strength, and pain.   ACTIVITY LIMITATIONS: carrying, lifting, and reach over head  PARTICIPATION LIMITATIONS: cleaning, laundry, and community activity  PERSONAL FACTORS: Time since onset of injury/illness/exacerbation and 3+ comorbidities: radiation hx to axilla, SLNB, disease recurrence.    are also affecting patient's functional outcome.   REHAB POTENTIAL: Good  CLINICAL DECISION MAKING: Evolving/moderate complexity  EVALUATION COMPLEXITY: Moderate  GOALS: Goals reviewed with patient? Yes  SHORT TERM GOALS: Target date: 05/07/23  Pt will be educated on lymphedema - physiology, healthy habits, importance of treatment, and treatment options  Baseline: Goal  status: INITIAL   LONG TERM GOALS: Target date: 05/28/23  Pt will obtain compression sleeve and glove for use during treatment with education on use Baseline:  Goal status: INITIAL  2.  Pt will improve bil shoulder reach to [facility] to allow for reach into the cabinets Baseline:  Goal status: INITIAL  3.  Pt will be ind with self MLD for the Lt UE  Baseline:  Goal status: INITIAL  4.  Pt will decrease QDASH to 15% or less to demonstrate improved mobility in the UE Baseline:  Goal status: INITIAL  5.  Pt will be ind with final HEP Baseline:  Goal status: INITIAL   PLAN:  PT FREQUENCY: 1-2x/week  PT DURATION: other: 5 weeks   PLANNED INTERVENTIONS: Therapeutic exercises, Neuromuscular re-education, Patient/Family education, Self Care, DME instructions, Manual therapy, and Re-evaluation  PLAN FOR NEXT SESSION: try fingers bandaging and tg soft? How is swelling? Order anything?  education on lymphedema - principles of care, physiology, etc, risk reduction and healthy practices.  Bil shoulder AAROM/PROM as able , MLD, may need full CDT  Added: 05/01/23: no coverage at [facility], faxed order to [facility] due to [employer] [place] 05/06/23: from [facility]: she would have to pay OOP but then could get reimbursed by her [employer]? Will let know if pt would like to do this 05/14/23: called [facility] and no coverage is guaranteed so she will not order there.  PT will check and send 1 more place before pt self orders.  She has ordering info.  05/16/23 - entered into [facility] portal 06/04/23; has processed     Idamae Lusher, PT 06/04/2023, 9:51 AM [place]

## 2023-06-05 DIAGNOSIS — I972 Postmastectomy lymphedema syndrome: Secondary | ICD-10-CM | POA: Diagnosis not present

## 2023-06-19 ENCOUNTER — Ambulatory Visit: Payer: Medicare Other | Attending: General Surgery | Admitting: Rehabilitation

## 2023-06-19 ENCOUNTER — Encounter: Payer: Self-pay | Admitting: Rehabilitation

## 2023-06-19 DIAGNOSIS — M25612 Stiffness of left shoulder, not elsewhere classified: Secondary | ICD-10-CM | POA: Diagnosis not present

## 2023-06-19 DIAGNOSIS — M25611 Stiffness of right shoulder, not elsewhere classified: Secondary | ICD-10-CM | POA: Insufficient documentation

## 2023-06-19 DIAGNOSIS — Z9013 Acquired absence of bilateral breasts and nipples: Secondary | ICD-10-CM | POA: Insufficient documentation

## 2023-06-19 DIAGNOSIS — Z171 Estrogen receptor negative status [ER-]: Secondary | ICD-10-CM | POA: Insufficient documentation

## 2023-06-19 DIAGNOSIS — C50412 Malignant neoplasm of upper-outer quadrant of left female breast: Secondary | ICD-10-CM | POA: Insufficient documentation

## 2023-06-19 DIAGNOSIS — I890 Lymphedema, not elsewhere classified: Secondary | ICD-10-CM | POA: Insufficient documentation

## 2023-06-19 NOTE — Patient Instructions (Signed)
Deep Effective Breath   1.) Standing, sitting, or laying down, place both hands on the belly. Take a deep breath IN, expanding the belly; then breath OUT, contracting the belly.   Copyright  VHI. All rights reserved.  Axilla to Axilla - Sweep   2.) On uninvolved side make 5 circles in the armpits,   3.) then pump/move _5__ times from involved armpit across chest to uninvolved armpit (Left to Right), making a pathway.    4.) On left side, make 5 circles at groin at panty line  Copyright  VHI. All rights reserved.  Arm Posterior: Elbow to Shoulder - Sweep   5.) Pump _5__ times from back of elbow to top of shoulder.   6.) Then inner to outer upper arm _5_ times, then outer arm again _5_ times.   Copyright  VHI. All rights reserved.  ARM: [place] Wrist to Elbow - Sweep   7/) Pump or stationary circles _5__ times from wrist to elbow making sure to do both sides of the forearm.     8) Pump or stationary circles _5__ times on back of hand including knuckle spaces and individual fingers if needed working up towards the wrist,   9.)then retrace all your steps working back up the forearm, doing both sides; upper outer arm and back to your pathways _2-3_ times each. Then do 5 circles again at armpits and involved groin where you started! Good job!! Do __1_ time per day.  Copyright  VHI. All rights reserved.

## 2023-06-19 NOTE — Therapy (Signed)
OUTPATIENT PHYSICAL THERAPY  UPPER EXTREMITY ONCOLOGY TREATMENT  Patient Name: Audrey Peters MRN: [id number] DOB:Aug 01, 1956, 67 y.o., female Today's Date: 06/19/2023  END OF SESSION:  PT End of Session - 06/19/23 1058     Visit Number 8    Number of Visits 14    Date for PT Re-Evaluation 07/10/23    PT Start Time 1015    PT Stop Time 1059    PT Time Calculation (min) 44 min    Activity Tolerance Patient tolerated treatment well    Behavior During Therapy WFL for tasks assessed/performed               Past Medical History:  Diagnosis Date   Anemia yrs ago   Arthritis    Breast cancer (HCC)    Cancer (HCC)    recent dx in breast   Carpal tunnel syndrome of right wrist    Diabetes mellitus without complication (HCC)    dx 2008   Headache    sinus   Hypertension    Peripheral neuropathy 2024   Hands and Feet   Personal history of chemotherapy    Personal history of radiation therapy    Vaginal delivery 1983   Past Surgical History:  Procedure Laterality Date   BREAST BIOPSY     BREAST BIOPSY Left 01/01/2023   [place] LT BREAST BX W LOC DEV 1ST LESION IMG BX SPEC US GUIDE 01/01/2023 [facility]   BREAST BIOPSY Left 01/01/2023   [place] LT BREAST BX W LOC DEV EA ADD LESION IMG BX SPEC US GUIDE 01/01/2023 [facility]   BREAST LUMPECTOMY Left    BREAST LUMPECTOMY WITH RADIOACTIVE SEED AND SENTINEL LYMPH NODE BIOPSY Left 04/30/2017   Procedure: LEFT BREAST LUMPECTOMY WITH RADIOACTIVE SEED AND LEFT SENTINEL LYMPH NODE BIOPSY ERAS PATHWAY;  Surgeon: Harriette Bouillon, MD;  Location: [facility];  Service: General;  Laterality: Left;   COLONOSCOPY WITH PROPOFOL N/A 05/28/2016   Procedure: COLONOSCOPY WITH PROPOFOL;  Surgeon: Charolett Bumpers, MD;  Location: [facility];  Service: Endoscopy;  Laterality: N/A;   DILATATION & CURETTAGE/HYSTEROSCOPY WITH MYOSURE N/A 04/20/2020   Procedure: DILATATION & CURETTAGE/HYSTEROSCOPY WITH MYOSURE;  Surgeon: Myna Hidalgo, DO;  Location:  [facility];  Service: Gynecology;  Laterality: N/A;   DILATION AND CURETTAGE OF UTERUS     HYSTEROSCOPY WITH D & C N/A 07/21/2015   Procedure: DILATATION AND CURETTAGE /HYSTEROSCOPY with myosure;  Surgeon: Myna Hidalgo, DO;  Location: [facility];  Service: Gynecology;  Laterality: N/A;   MASTECTOMY W/ SENTINEL NODE BIOPSY Left 03/12/2023   Procedure: LEFT MASTECTOMY WITH SENTINEL LYMPH NODE BIOPSY;  Surgeon: Almond Lint, MD;  Location: [facility];  Service: General;  Laterality: Left;   PORTACATH PLACEMENT Right 04/30/2017   Procedure: INSERTION PORT-A-CATH;  Surgeon: Harriette Bouillon, MD;  Location: [facility];  Service: General;  Laterality: Right;   PORTACATH PLACEMENT N/A 03/12/2023   Procedure: PORT PLACEMENT WITH ULTRASOUND GUIDANCE;  Surgeon: Almond Lint, MD;  Location: [facility];  Service: General;  Laterality: N/A;   SIMPLE MASTECTOMY WITH AXILLARY SENTINEL NODE BIOPSY Right 03/12/2023   Procedure: RIGHT MASTECTOMY;  Surgeon: Almond Lint, MD;  Location: [facility];  Service: General;  Laterality: Right;   Patient Active Problem List   Diagnosis Date Noted   Recurrent breast cancer, left (HCC) 03/12/2023   Atrophy of vagina 09/12/2020   Bilateral lower extremity edema 09/12/2020   History of ductal carcinoma in situ of breast 09/12/2020  Hyperglycemia due to type 2 diabetes mellitus (HCC) 09/12/2020   Knee pain 09/12/2020   Long term (current) use of insulin (HCC) 09/12/2020   Lumbosacral spondylosis without myelopathy 09/12/2020   Mixed hyperlipidemia 09/12/2020   Obstructive sleep apnea syndrome 09/12/2020   Ovarian cyst 09/12/2020   Overweight 09/12/2020   Personal history of malignant neoplasm of breast 09/12/2020   Postmenopausal bleeding 09/12/2020   Pure hypercholesterolemia 09/12/2020   Sciatica 09/12/2020   Morbid obesity (HCC) 09/12/2020   Chemotherapy-induced peripheral neuropathy (HCC) 01/22/2018   Encounter  for antineoplastic chemotherapy 06/07/2017   Port-A-Cath in place 05/24/2017   Genetic testing 05/23/2017   Malignant neoplasm of upper-outer quadrant of left breast in female, estrogen receptor negative (HCC) 04/22/2017   Essential hypertension 01/30/2015   Diabetes mellitus (HCC) 01/30/2015    REFERRING PROVIDER: Dr. Donell Beers    REFERRING DIAG:  Diagnosis  C50.412,Z17.1 (ICD-10-CM) - Malignant neoplasm of upper-outer quadrant of left breast in female, estrogen receptor negative (HCC)    THERAPY DIAG:  Malignant neoplasm of upper-outer quadrant of left breast in female, estrogen receptor negative (HCC)  Lymphedema, not elsewhere classified  Stiffness of left shoulder, not elsewhere classified  Stiffness of right shoulder, not elsewhere classified  Status post mastectomy, bilateral  ONSET DATE: 12/2022  Rationale for Evaluation and Treatment: Rehabilitation  SUBJECTIVE:                                                                                                                                                                                           SUBJECTIVE STATEMENT:  I have my treatment tomorrow.  I got my sleeve and glove   PERTINENT HISTORY: New/recurrent breast cancer on the left. 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 Port placement and bil mastectomy 03/12/23 with removal of 2 negative nodes. Treatment in 2018 with lumpectomy, SLNB, chemo and XRT.  1 negative node removed in 2018.   aspirarted frorm Rt and 150 from Lt 04/16/23. Seroma catheters may be needed. Will be doing chemo again.    PAIN:  Are you having pain? The pain is worse when she lies down and moves, but is better when she sits still.   PRECAUTIONS: Lt lymphedema   RED FLAGS: None   WEIGHT BEARING RESTRICTIONS: No  FALLS:  Has patient fallen in last 6 months? No  LIVING ENVIRONMENT: Lives with: lives with their family and lives with their spouse  OCCUPATION: Retired   LEISURE: nothing  really,  get back to water aerobics.    HAND DOMINANCE: right   PRIOR LEVEL OF FUNCTION: Independent  PATIENT GOALS: get more movement back    OBJECTIVE:  COGNITION: Overall cognitive status: Within functional limits for tasks assessed   PALPATION: sloshing full chest bilateral chest wall with return of seroma - note sent to Dr. Donell Beers   OBSERVATIONS / OTHER ASSESSMENTS: Left incision well healed - Rt incision has a small place covered with a band-aid that is still open and draining - white tissue present.   Lt back of hand and wrist appear larger with less visible landmarks  SENSATION: Numbness left tricep region.   POSTURE: rounded shoulders, forward head    UPPER EXTREMITY AROM/PROM:  A/PROM RIGHT   eval  RIGHT  05/07/2023  Shoulder extension 50   Shoulder flexion 115 - incision 168  Shoulder abduction 90 - pulls in incision 157 no pulling   Shoulder internal rotation    Shoulder external rotation 75     (Blank rows = not tested)  A/PROM LEFT   eval LEFT 05/07/2023 06/04/23  Shoulder extension 50    Shoulder flexion 117 - feels cording like arm pull but not visible 152 ( no pulling)  120 - feels tight upper arm / 145 post stretching   Shoulder abduction 85 148 130 - pull in upper arm   Shoulder internal rotation     Shoulder external rotation 75      (Blank rows = not tested)  UPPER EXTREMITY STRENGTH:   LYMPHEDEMA ASSESSMENTS:   LANDMARK RIGHT  eval  At axilla    15 cm proximal to olecranon process 39.7  10 cm proximal to olecranon process 38.7  Olecranon process 32.5  15 cm proximal to ulnar styloid process 27.3  10 cm proximal to ulnar styloid process 25.1  Just proximal to ulnar styloid process 18  Across hand at thumb web space 20.5  At base of 2nd digit 6.1  (Blank rows = not tested)  LANDMARK LEFT  eval  At axilla    15 cm proximal to olecranon process 40.5  10 cm proximal to olecranon process 40.5  Olecranon process 36.5  15 cm proximal  to ulnar styloid process 29.2  10 cm proximal to ulnar styloid process 25.8  Just proximal to ulnar styloid process 18.8  Across hand at thumb web space 20.3  At base of 2nd digit 5.8  (Blank rows = not tested) 10cm  Rt: 2981 Lt: 3395 difference  QUICK DASH SURVEY: 45%   TODAY'S TREATMENT:                                                                                                                                          DATE:  06/19/23 Education on donning sleeve. Pt had it on but it was all bunched at the elbow.   Applied with hands and then with butler x 2.  Then PT demonstrated easy slide arm x 1 and gave handouts on both.  Performed seated self MLD education per instruction section with PT demonstrating each  step and then pt performing each.  Handout given.   06/04/23 Rechecked ROM Dowel flexion x 5 AROM flexion alternating x 5  Supine c/cc circles with straight arm x 5 each In supine: Short neck, superficial and deep abdominals and diaphragmatic breaths, bil axillary nodes and establishment of interaxillary pathway, then L UE working proximal to distal, moving fluid from upper inner arm outwards, and doing both sides of forearm moving fluid towards pathways spending extra time in any areas of fibrosis then retracing all steps.   05/27/23 In supine: Short neck, superficial and deep abdominals and diaphragmatic breaths, bil axillary nodes and establishment of interaxillary pathway, then L UE working proximal to distal, moving fluid from upper inner arm outwards, and doing both sides of forearm moving fluid towards pathways spending extra time in any areas of fibrosis then retracing all steps. Cocoa butter used over fingers and hand.    PATIENT EDUCATION:  Education details: per today's note Person educated: Patient Education method: Chief Technology Officer Education comprehension: verbalized understanding, returned demonstration, and needs further education  HOME  EXERCISE PROGRAM: Post op breast with wall flexion as an option instead of hands clasped  Self MLD Use of sleeve  ASSESSMENT:  CLINICAL IMPRESSION: Pt now has her sleeve which fits well but she had not applied it correctly for the way in stopping at the elbow.  She was instructed on proper donning.  Pt has her infusion tomorrow so she was also educated in self MLD.  She will have a sleeve and self MLD knowledge for her next infusion when her arm usually swells.  Pt knows to let us know if the sleeve doesn't work for her or if she needs additional sleeves.  Extending POC.    OBJECTIVE IMPAIRMENTS: decreased activity tolerance, decreased knowledge of condition, decreased knowledge of use of DME, decreased ROM, decreased strength, and pain.   ACTIVITY LIMITATIONS: carrying, lifting, and reach over head  PARTICIPATION LIMITATIONS: cleaning, laundry, and community activity  PERSONAL FACTORS: Time since onset of injury/illness/exacerbation and 3+ comorbidities: radiation hx to axilla, SLNB, disease recurrence.    are also affecting patient's functional outcome.   REHAB POTENTIAL: Good  CLINICAL DECISION MAKING: Evolving/moderate complexity  EVALUATION COMPLEXITY: Moderate  GOALS: Goals reviewed with patient? Yes  SHORT TERM GOALS: Target date: 05/07/23  Pt will be educated on lymphedema - physiology, healthy habits, importance of treatment, and treatment options  Baseline: Goal status: MET   LONG TERM GOALS: Target date: 07/10/23  Pt will obtain compression sleeve and glove for use during treatment with education on use Baseline:  Goal status: MET  2.  Pt will improve bil shoulder reach to [facility] to allow for reach into the cabinets Baseline:  Goal status: MET  3.  Pt will be ind with self MLD for the Lt UE  Baseline:  Goal status: IN PROGRESS  4.  Pt will decrease QDASH to 15% or less to demonstrate improved mobility in the UE Baseline:  Goal status: IN PROGRESS  5.  Pt  will be ind with final HEP Baseline:  Goal status: IN PROGRESS   PLAN:  PT FREQUENCY: 1-2x/week  PT DURATION: 4 weeks   PLANNED INTERVENTIONS: Therapeutic exercises, Neuromuscular re-education, Patient/Family education, Self Care, DME instructions, Manual therapy, and Re-evaluation  PLAN FOR NEXT SESSION:  Bil shoulder AAROM/PROM as able , MLD Lt UE, may need full CDT  Added: 05/01/23: no coverage at [facility], faxed order to [facility] due to [facility] 05/06/23: [facility]: she would have to pay OOP but then could get reimbursed by her [employer]? Will let know if pt would like to do this 05/14/23: called [facility] and no coverage is guaranteed so she will not order there.  PT will check and send 1 more place before pt self orders.  She has ordering info.  05/16/23 - entered into [facility] portal 06/04/23; has processed     Idamae Lusher, PT 06/19/2023, 10:59 AM [place]

## 2023-06-20 ENCOUNTER — Inpatient Hospital Stay: Payer: BC Managed Care – PPO

## 2023-06-20 ENCOUNTER — Inpatient Hospital Stay (HOSPITAL_BASED_OUTPATIENT_CLINIC_OR_DEPARTMENT_OTHER): Payer: BC Managed Care – PPO | Admitting: Hematology and Oncology

## 2023-06-20 ENCOUNTER — Inpatient Hospital Stay: Payer: BC Managed Care – PPO | Attending: Hematology and Oncology

## 2023-06-20 VITALS — BP 118/67 | HR 78 | Temp 97.3°F | Resp 18 | Ht 64.0 in | Wt 222.7 lb

## 2023-06-20 VITALS — BP 122/75 | HR 69 | Resp 18

## 2023-06-20 DIAGNOSIS — Z171 Estrogen receptor negative status [ER-]: Secondary | ICD-10-CM | POA: Diagnosis not present

## 2023-06-20 DIAGNOSIS — C50412 Malignant neoplasm of upper-outer quadrant of left female breast: Secondary | ICD-10-CM

## 2023-06-20 DIAGNOSIS — D6481 Anemia due to antineoplastic chemotherapy: Secondary | ICD-10-CM | POA: Insufficient documentation

## 2023-06-20 DIAGNOSIS — Z5111 Encounter for antineoplastic chemotherapy: Secondary | ICD-10-CM | POA: Insufficient documentation

## 2023-06-20 LAB — CBC WITH DIFFERENTIAL (CANCER CENTER ONLY)
Abs Immature Granulocytes: 0.01 10*3/uL (ref 0.00–0.07)
Basophils Absolute: 0 10*3/uL (ref 0.0–0.1)
Basophils Relative: 0 %
Eosinophils Absolute: 0.2 10*3/uL (ref 0.0–0.5)
Eosinophils Relative: 4 %
HCT: 36.5 % (ref 36.0–46.0)
Hemoglobin: 11.4 g/dL — ABNORMAL LOW (ref 12.0–15.0)
Immature Granulocytes: 0 %
Lymphocytes Relative: 37 %
Lymphs Abs: 2 10*3/uL (ref 0.7–4.0)
MCH: 27.8 pg (ref 26.0–34.0)
MCHC: 31.2 g/dL (ref 30.0–36.0)
MCV: 89 fL (ref 80.0–100.0)
Monocytes Absolute: 0.5 10*3/uL (ref 0.1–1.0)
Monocytes Relative: 10 %
Neutro Abs: 2.7 10*3/uL (ref 1.7–7.7)
Neutrophils Relative %: 49 %
Platelet Count: 146 10*3/uL — ABNORMAL LOW (ref 150–400)
RBC: 4.1 MIL/uL (ref 3.87–5.11)
RDW: 14.1 % (ref 11.5–15.5)
WBC Count: 5.5 10*3/uL (ref 4.0–10.5)
nRBC: 0 % (ref 0.0–0.2)

## 2023-06-20 LAB — CMP (CANCER CENTER ONLY)
ALT: 30 U/L (ref 0–44)
AST: 38 U/L (ref 15–41)
Albumin: 3.8 g/dL (ref 3.5–5.0)
Alkaline Phosphatase: 60 U/L (ref 38–126)
Anion gap: 8 (ref 5–15)
BUN: 26 mg/dL — ABNORMAL HIGH (ref 8–23)
CO2: 26 mmol/L (ref 22–32)
Calcium: 9.3 mg/dL (ref 8.9–10.3)
Chloride: 104 mmol/L (ref 98–111)
Creatinine: 0.94 mg/dL (ref 0.44–1.00)
GFR, Estimated: >60 mL/min (ref >60)
Glucose, Bld: 239 mg/dL — ABNORMAL HIGH (ref 70–99)
Potassium: 3.7 mmol/L (ref 3.5–5.1)
Sodium: 138 mmol/L (ref 135–145)
Total Bilirubin: 0.4 mg/dL (ref <1.2)
Total Protein: 6.9 g/dL (ref 6.5–8.1)

## 2023-06-20 MED ORDER — SODIUM CHLORIDE 0.9% FLUSH
10.0000 mL | INTRAVENOUS | Status: DC | PRN
  Administered 2023-06-20: 10 mL

## 2023-06-20 MED ORDER — SODIUM CHLORIDE 0.9 % IV SOLN
600.0000 mg/m2 | Freq: Once | INTRAVENOUS | Status: AC
  Administered 2023-06-20: 1260 mg via INTRAVENOUS
  Filled 2023-06-20: qty 63

## 2023-06-20 MED ORDER — SODIUM CHLORIDE 0.9 % IV SOLN: Freq: Once | INTRAVENOUS | Status: AC

## 2023-06-20 MED ORDER — PALONOSETRON HCL INJECTION 0.25 MG/5ML
0.2500 mg | Freq: Once | INTRAVENOUS | Status: AC
  Administered 2023-06-20: 0.25 mg via INTRAVENOUS
  Filled 2023-06-20: qty 5

## 2023-06-20 MED ORDER — METHOTREXATE SODIUM CHEMO INJECTION (PF) 50 MG/2ML
40.0000 mg/m2 | Freq: Once | INTRAMUSCULAR | Status: AC
Start: 2023-06-20 — End: 2023-06-20
  Administered 2023-06-20: 83.5 mg via INTRAVENOUS
  Filled 2023-06-20: qty 3.34

## 2023-06-20 MED ORDER — FLUOROURACIL CHEMO INJECTION 2.5 GM/50ML
600.0000 mg/m2 | Freq: Once | INTRAVENOUS | Status: AC
  Administered 2023-06-20: 1250 mg via INTRAVENOUS
  Filled 2023-06-20: qty 25

## 2023-06-20 MED ORDER — HEPARIN SOD (PORK) LOCK FLUSH 100 UNIT/ML IV SOLN
500.0000 [IU] | Freq: Once | INTRAVENOUS | Status: AC | PRN
  Administered 2023-06-20: 500 [IU]

## 2023-06-20 MED ORDER — DEXAMETHASONE SODIUM PHOSPHATE 10 MG/ML IJ SOLN
4.0000 mg | Freq: Once | INTRAMUSCULAR | Status: AC
  Administered 2023-06-20: 4 mg via INTRAVENOUS
  Filled 2023-06-20: qty 1

## 2023-06-20 NOTE — Patient Instructions (Signed)
[facility] - A DEPT OF [facility]  Discharge Instructions: Thank you for choosing [facility] to provide your oncology and hematology care.   If you have a lab appointment with the Cancer Center, please go directly to the Cancer Center and check in at the registration area.   Wear comfortable clothing and clothing appropriate for easy access to any Portacath or PICC line.   We strive to give you quality time with your provider. You may need to reschedule your appointment if you arrive late (15 or more minutes).  Arriving late affects you and other patients whose appointments are after yours.  Also, if you miss three or more appointments without notifying the office, you may be dismissed from the clinic at the provider's discretion.      For prescription refill requests, have your pharmacy contact our office and allow 72 hours for refills to be completed.    Today you received the following chemotherapy and/or immunotherapy agents Cytoxan, Methotrexate, Fluorouracil.      To help prevent nausea and vomiting after your treatment, we encourage you to take your nausea medication as directed.  BELOW ARE SYMPTOMS THAT SHOULD BE REPORTED IMMEDIATELY: *FEVER GREATER THAN 100.4 F (38 C) OR HIGHER *CHILLS OR SWEATING *NAUSEA AND VOMITING THAT IS NOT CONTROLLED WITH YOUR NAUSEA MEDICATION *UNUSUAL SHORTNESS OF BREATH *UNUSUAL BRUISING OR BLEEDING *URINARY PROBLEMS (pain or burning when urinating, or frequent urination) *BOWEL PROBLEMS (unusual diarrhea, constipation, pain near the anus) TENDERNESS IN MOUTH AND THROAT WITH OR WITHOUT PRESENCE OF ULCERS (sore throat, sores in mouth, or a toothache) UNUSUAL RASH, SWELLING OR PAIN  UNUSUAL VAGINAL DISCHARGE OR ITCHING   Items with * indicate a potential emergency and should be followed up as soon as possible or go to the Emergency Department if any problems should occur.  Please show the CHEMOTHERAPY  ALERT CARD or IMMUNOTHERAPY ALERT CARD at check-in to the Emergency Department and triage nurse.  Should you have questions after your visit or need to cancel or reschedule your appointment, please contact [facility] - A DEPT OF [facility]  Dept: [phone]  and follow the prompts.  Office hours are 8:00 a.m. to 4:30 p.m. Monday - Friday. Please note that voicemails left after 4:00 p.m. may not be returned until the following business day.  We are closed weekends and major holidays. You have access to a nurse at all times for urgent questions. Please call the main number to the clinic Dept: [phone] and follow the prompts.   For any non-urgent questions, you may also contact your provider using MyChart. We now offer e-Visits for anyone 65 and older to request care online for non-urgent symptoms. For details visit [email or web address].   Also download the MyChart app! Go to the app store, search "MyChart", open the app, select [facility], and log in with your MyChart username and password.

## 2023-06-20 NOTE — Progress Notes (Signed)
Patient Care Team: [name], MD as PCP - General (Hematology and Oncology) [name], MD as Consulting Physician (Hematology and Oncology) Lonie Peak, MD as Attending Physician (Radiation Oncology) Harriette Bouillon, MD as Consulting Physician (General Surgery) Axel Filler Larna Daughters, NP as Nurse Practitioner (Hematology and Oncology)  DIAGNOSIS:  Encounter Diagnosis  Name Primary?   Malignant neoplasm of upper-outer quadrant of left breast in female, estrogen receptor negative (HCC) Yes    SUMMARY OF ONCOLOGIC HISTORY: Oncology History  Malignant neoplasm of upper-outer quadrant of left breast in female, estrogen receptor negative (HCC)  04/05/2017 Initial Diagnosis   Left breast asymmetry by ultrasound measured 1.3 cm at 2:30 position 10 cm from nipple, no axillary lymph nodes; biopsy IDC grade 2, ER 0%, PR 0%, HER-2 negative ratio 1.37, Ki-67 40%, T1c N0 stage IB AJCC 8    04/30/2017 Surgery   Left lumpectomy: IDC grade 3, 1.7 cm, DCIS, lymphovascular invasion present, margins negative, 0/1 lymph node negative, ER 0%, PR 0%, HER-2 negative ratio 1.37, Ki-67 40%, T1c N0 stage IB   05/22/2017 Genetic Testing   Patient had genetic testing due to a personal history of triple negative breast cancer.  The Common Hereditary Cancer Panel was ordered. The Hereditary Gene Panel offered by [facility] includes sequencing and/or deletion duplication testing of the following 46 genes: APC, ATM, AXIN2, BARD1, BMPR1A, BRCA1, BRCA2, BRIP1, CDH1, CDKN2A (p14ARF), CDKN2A (p16INK4a), CHEK2, CTNNA1, DICER1, EPCAM (Deletion/duplication testing only), GREM1 (promoter region deletion/duplication testing only), KIT, MEN1, MLH1, MSH2, MSH3, MSH6, MUTYH, NBN, NF1, NHTL1, PALB2, PDGFRA, PMS2, POLD1, POLE, PTEN, RAD50, RAD51C, RAD51D, SDHB, SDHC, SDHD, SMAD4, SMARCA4. STK11, TP53, TSC1, TSC2, and VHL.  The following genes were evaluated for sequence changes only: SDHA and HOXB13 c.251G>A variant only.     Results: No pathogenic mutations identified.  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  The date of this test report is 05/22/2017.    05/24/2017 - 08/30/2017 Chemotherapy   Dose dense Adriamycin and Cytoxan 4 followed by Taxol weekly 5 (stopped early for neuropathy)    09/26/2017 - 10/22/2017 Radiation Therapy   Adjuvant radiation therapy   12/10/2022 Relapse/Recurrence   Mammogram detected distortion, 2 irregular masses 1.9 cm and 1.2 cm: Biopsy grade 2 IDC with DCIS triple negative, scans negative, MRI breast: 6.1 cm   03/12/2023 Surgery   Bilateral mastectomies: Right breast: Benign  left mastectomy: Grade 3 IDC 6 cm with high-grade DCIS, margins negative, 1 intramammary lymph node negative, 0/2 sentinel lymph nodes, ER 0%, PR 0%, HER2 0, Ki-67 30%   05/09/2023 -  Chemotherapy   Patient is on Treatment Plan : BREAST Adjuvant CMF IV q21d       CHIEF COMPLIANT: Cycle 2 CMF  HISTORY OF PRESENT ILLNESS:   History of Present Illness   The patient, with a history of lymph node removal, presents with lymphedema in the hand presents today for cycle 2 CMF. The swelling in the left hand was significant and has been managed by a therapist. The patient reports that the swelling seemed to worsen after chemotherapy, but has since improved with the use of a sleeve and glove. The patient denies any other side effects from the chemotherapy. She reports good energy levels and appetite. The patient also mentions a recent trip to [facility], indicating a good level of activity and overall well-being.         ALLERGIES:  is allergic to canagliflozin, empagliflozin, other, sulfa antibiotics, and ciprofloxacin.  MEDICATIONS:  Current Outpatient Medications  Medication Sig Dispense Refill   acetaminophen (TYLENOL) 650 MG CR tablet Take 650 mg by mouth every 8 (eight) hours as needed for pain.     atorvastatin (LIPITOR) 10 MG tablet 1 tablet     clotrimazole-betamethasone (LOTRISONE)  cream      Continuous Blood Gluc Sensor (FREESTYLE LIBRE 2 SENSOR) MISC .     DULoxetine (CYMBALTA) 30 MG capsule Take 30 mg by mouth daily.     gabapentin (NEURONTIN) 100 MG capsule Take 100 mg by mouth 3 (three) times daily.     glucose blood (ONETOUCH VERIO) test strip use to check blood sugar     glucose blood test strip by miscellaneous route.     Insulin Pen Needle (B-D ULTRAFINE III SHORT PEN) 31G X 8 MM MISC 3 (three) times daily.     insulin regular human CONCENTRATED (HUMULIN R U-500 KWIKPEN) 500 UNIT/ML kwikpen 150 units before breakfast, 80 units before lunch, 80 units before evening meal     lidocaine-prilocaine (EMLA) cream Apply to affected area once 30 g 3   losartan (COZAAR) 100 MG tablet Take 100 mg by mouth daily.     metFORMIN (GLUCOPHAGE-XR) 500 MG 24 hr tablet 2 tablets with evening meal     methocarbamol (ROBAXIN) 500 MG tablet Take 1 tablet (500 mg total) by mouth every 6 (six) hours as needed for muscle spasms. 20 tablet 3   ondansetron (ZOFRAN) 8 MG tablet Take 1 tablet (8 mg total) by mouth every 8 (eight) hours as needed for nausea or vomiting. Start on the third day after chemotherapy. 30 tablet 1   ONETOUCH VERIO test strip 2 (two) times daily.     oxyCODONE (OXY IR/ROXICODONE) 5 MG immediate release tablet Take 1 tablet (5 mg total) by mouth every 4 (four) hours as needed for moderate pain. 30 tablet 0   pregabalin (LYRICA) 150 MG capsule 1 capsule     prochlorperazine (COMPAZINE) 10 MG tablet Take 1 tablet (10 mg total) by mouth every 6 (six) hours as needed for nausea or vomiting. 30 tablet 1   Semaglutide,0.25 or 0.5MG /DOS, 2 MG/1.5ML SOPN Inject 0.25 mg into the skin once a week.     senna (SENOKOT) 8.6 MG TABS tablet Take 1 tablet (8.6 mg total) by mouth 2 (two) times daily. 60 tablet 0   triamterene-hydrochlorothiazide (MAXZIDE-25) 37.5-25 MG tablet Take 1 tablet by mouth every morning.     No current facility-administered medications for this visit.     PHYSICAL EXAMINATION: ECOG PERFORMANCE STATUS: 1 - Symptomatic but completely ambulatory  Vitals:   06/20/23 0853  BP: 118/67  Pulse: 78  Resp: 18  Temp: (!) 97.3 F (36.3 C)  SpO2: 100%   Filed Weights   06/20/23 0853  Weight: 222 lb 11.2 oz (101 kg)    Physical Exam   significant left arm lymphedema  LABORATORY DATA:  I have reviewed the data as listed    Latest Ref Rng & Units 06/20/2023    8:20 AM 05/30/2023    9:15 AM 05/09/2023    8:11 AM  CMP  Glucose 70 - 99 mg/dL 161  096  045   BUN 8 - 23 mg/dL 26  35  28   Creatinine 0.44 - 1.00 mg/dL 4.09  8.11  9.14   Sodium 135 - 145 mmol/L 138  139  140   Potassium 3.5 - 5.1 mmol/L 3.7  3.8  3.9   Chloride 98 - 111 mmol/L 104  104  106  CO2 22 - 32 mmol/L 26  25  25    Calcium 8.9 - 10.3 mg/dL 9.3  9.2  8.8   Total Protein 6.5 - 8.1 g/dL 6.9  7.0  6.9   Total Bilirubin <1.2 mg/dL 0.4  0.4  0.4   Alkaline Phos 38 - 126 U/L 60  66  73   AST 15 - 41 U/L 38  41  48   ALT 0 - 44 U/L 30  33  32     Lab Results  Component Value Date   WBC 5.5 06/20/2023   HGB 11.4 (L) 06/20/2023   HCT 36.5 06/20/2023   MCV 89.0 06/20/2023   PLT 146 (L) 06/20/2023   NEUTROABS 2.7 06/20/2023    ASSESSMENT & PLAN:  Malignant neoplasm of upper-outer quadrant of left breast in female, estrogen receptor negative (HCC) 04/30/2017: Left lumpectomy: IDC grade 3, 1.7 cm, DCIS, lymphovascular invasion present, margins negative, 0/1 lymph node negative, ER 0%, PR 0%, HER-2 negative ratio 1.37, Ki-67 40%, T1c N0 stage IB    Treatment summary: 1. adjuvant chemotherapy with dose dense Adriamycin and Cytoxan 4 followed by Taxol weekly 6 discontinued for neuropathy 2. Followed by radiation started 09/26/2017-10/25/2018  Breast cancer recurrence: mammogram 12/10/2022: Asymmetry/distortion left breast, ultrasound: 2 adjacent irregular masses 2 o'clock position left breast 1.9 cm and 1.2 cm, no suspicious lymph nodes Left breast biopsy 2:00  posterior: Grade 2 IDC with DCIS Left breast biopsy 2:00 anterior: Grade 2 IDC with DCIS ER 0%, PR 0%, HER2 0, Ki-67 30% 01/31/2023: Bone scan: Negative, CT CAP 01/26/2023: Negative for metastatic disease but nodular contour of the liver suggestive of cirrhosis 01/26/2023: Ultrasound left submandibular neck: Normal lymph node 1.2 cm 01/24/2023: MRI breast: The recently biopsied area measured 6.1 cm, skin thickening (post radiation)   Recommendation: 03/12/2023: Bilateral mastectomies: Right breast: Benign left mastectomy: Grade 3 IDC 6 cm with high-grade DCIS, margins negative, 1 intramammary lymph node negative, 0/2 sentinel lymph nodes, ER 0%, PR 0%, HER2 0, Ki-67 30% Followed by adjuvant chemotherapy with CMF x 6 cycles started 05/09/2023 ------------------------------------------------------------------------------------------------------------------------------------------------ Current treatment: Cycle 2 CMF (chemo delayed because of recent travel to [facility]) Chemo toxicities: Fatigue Chemotherapy-induced anemia: Mild Lymphedema left hand: Patient is working with physical therapy.  She had fluid buildup in the chest wall which was drained and this is helping her.  Return to clinic in 3 weeks for cycle 3 ------------------------------------- Assessment and Plan    Lymphedema Significant hand swelling post-surgery and chemotherapy. Discussed the difference between lymphedema and fluid retention. The patient is currently working with a therapist. -Continue therapy for lymphedema management.  Hyperglycemia Elevated blood sugar levels noted. The patient is currently managing diet. -Monitor blood sugar levels and continue dietary management.  Chemotherapy The patient is currently undergoing chemotherapy. Lab results indicate normal white count, hemoglobin, platelets, electrolytes, kidney and liver function. -Continue with chemotherapy as planned. -Next treatment scheduled for 07/10/2023.           No orders of the defined types were placed in this encounter.  The patient has a good understanding of the overall plan. she agrees with it. she will call with any problems that may develop before the next visit here. Total time spent: 30 mins including face to face time and time spent for planning, charting and co-ordination of care   Tamsen Meek, MD 06/20/23

## 2023-06-20 NOTE — Assessment & Plan Note (Addendum)
04/30/2017: Left lumpectomy: IDC grade 3, 1.7 cm, DCIS, lymphovascular invasion present, margins negative, 0/1 lymph node negative, ER 0%, PR 0%, HER-2 negative ratio 1.37, Ki-67 40%, T1c N0 stage IB    Treatment summary: 1. adjuvant chemotherapy with dose dense Adriamycin and Cytoxan 4 followed by Taxol weekly 6 discontinued for neuropathy 2. Followed by radiation started 09/26/2017-10/25/2018  Breast cancer recurrence: mammogram 12/10/2022: Asymmetry/distortion left breast, ultrasound: 2 adjacent irregular masses 2 o'clock position left breast 1.9 cm and 1.2 cm, no suspicious lymph nodes Left breast biopsy 2:00 posterior: Grade 2 IDC with DCIS Left breast biopsy 2:00 anterior: Grade 2 IDC with DCIS ER 0%, PR 0%, HER2 0, Ki-67 30% 01/31/2023: Bone scan: Negative, CT CAP 01/26/2023: Negative for metastatic disease but nodular contour of the liver suggestive of cirrhosis 01/26/2023: Ultrasound left submandibular neck: Normal lymph node 1.2 cm 01/24/2023: MRI breast: The recently biopsied area measured 6.1 cm, skin thickening (post radiation)   Recommendation: 03/12/2023: Bilateral mastectomies: Right breast: Benign left mastectomy: Grade 3 IDC 6 cm with high-grade DCIS, margins negative, 1 intramammary lymph node negative, 0/2 sentinel lymph nodes, ER 0%, PR 0%, HER2 0, Ki-67 30% Followed by adjuvant chemotherapy with CMF x 6 cycles started 05/09/2023 ------------------------------------------------------------------------------------------------------------------------------------------------ Current treatment: Cycle 2 CMF (chemo delayed because of recent travel to [facility]) Chemo toxicities: Fatigue Chemotherapy-induced anemia: Mild Lymphedema left hand: Patient is working with physical therapy.  She had fluid buildup in the chest wall which was drained and this is helping her.  Return to clinic in 3 weeks for cycle 3

## 2023-06-25 ENCOUNTER — Ambulatory Visit: Payer: Medicare Other | Admitting: Rehabilitation

## 2023-06-25 ENCOUNTER — Encounter: Payer: Self-pay | Admitting: Rehabilitation

## 2023-06-25 DIAGNOSIS — M25611 Stiffness of right shoulder, not elsewhere classified: Secondary | ICD-10-CM

## 2023-06-25 DIAGNOSIS — I890 Lymphedema, not elsewhere classified: Secondary | ICD-10-CM

## 2023-06-25 DIAGNOSIS — Z9013 Acquired absence of bilateral breasts and nipples: Secondary | ICD-10-CM

## 2023-06-25 DIAGNOSIS — M25612 Stiffness of left shoulder, not elsewhere classified: Secondary | ICD-10-CM

## 2023-06-25 DIAGNOSIS — C50412 Malignant neoplasm of upper-outer quadrant of left female breast: Secondary | ICD-10-CM | POA: Diagnosis not present

## 2023-06-25 DIAGNOSIS — Z171 Estrogen receptor negative status [ER-]: Secondary | ICD-10-CM

## 2023-06-25 NOTE — Therapy (Signed)
OUTPATIENT PHYSICAL THERAPY  UPPER EXTREMITY ONCOLOGY TREATMENT  Patient Name: Audrey Peters MRN: [id number] DOB:05/28/1956, 67 y.o., female Today's Date: 06/25/2023  END OF SESSION:  PT End of Session - 06/25/23 1012     Visit Number 9    Number of Visits 21    Date for PT Re-Evaluation 08/05/23    Authorization Type none needed    PT Start Time 1010    PT Stop Time 1053    PT Time Calculation (min) 43 min    Activity Tolerance Patient tolerated treatment well    Behavior During Therapy WFL for tasks assessed/performed               Past Medical History:  Diagnosis Date   Anemia yrs ago   Arthritis    Breast cancer (HCC)    Cancer (HCC)    recent dx in breast   Carpal tunnel syndrome of right wrist    Diabetes mellitus without complication (HCC)    dx 2008   Headache    sinus   Hypertension    Peripheral neuropathy 2024   Hands and Feet   Personal history of chemotherapy    Personal history of radiation therapy    Vaginal delivery 1983   Past Surgical History:  Procedure Laterality Date   BREAST BIOPSY     BREAST BIOPSY Left 01/01/2023   [place] LT BREAST BX W LOC DEV 1ST LESION IMG BX SPEC US GUIDE 01/01/2023 [facility]   BREAST BIOPSY Left 01/01/2023   [place] LT BREAST BX W LOC DEV EA ADD LESION IMG BX SPEC US GUIDE 01/01/2023 [facility]   BREAST LUMPECTOMY Left    BREAST LUMPECTOMY WITH RADIOACTIVE SEED AND SENTINEL LYMPH NODE BIOPSY Left 04/30/2017   Procedure: LEFT BREAST LUMPECTOMY WITH RADIOACTIVE SEED AND LEFT SENTINEL LYMPH NODE BIOPSY ERAS PATHWAY;  Surgeon: Harriette Bouillon, MD;  Location: [facility];  Service: General;  Laterality: Left;   COLONOSCOPY WITH PROPOFOL N/A 05/28/2016   Procedure: COLONOSCOPY WITH PROPOFOL;  Surgeon: Charolett Bumpers, MD;  Location: [facility];  Service: Endoscopy;  Laterality: N/A;   DILATATION & CURETTAGE/HYSTEROSCOPY WITH MYOSURE N/A 04/20/2020   Procedure: DILATATION & CURETTAGE/HYSTEROSCOPY WITH MYOSURE;   Surgeon: Myna Hidalgo, DO;  Location: [facility];  Service: Gynecology;  Laterality: N/A;   DILATION AND CURETTAGE OF UTERUS     HYSTEROSCOPY WITH D & C N/A 07/21/2015   Procedure: DILATATION AND CURETTAGE /HYSTEROSCOPY with myosure;  Surgeon: Myna Hidalgo, DO;  Location: [facility];  Service: Gynecology;  Laterality: N/A;   MASTECTOMY W/ SENTINEL NODE BIOPSY Left 03/12/2023   Procedure: LEFT MASTECTOMY WITH SENTINEL LYMPH NODE BIOPSY;  Surgeon: Almond Lint, MD;  Location: [facility];  Service: General;  Laterality: Left;   PORTACATH PLACEMENT Right 04/30/2017   Procedure: INSERTION PORT-A-CATH;  Surgeon: Harriette Bouillon, MD;  Location: [facility];  Service: General;  Laterality: Right;   PORTACATH PLACEMENT N/A 03/12/2023   Procedure: PORT PLACEMENT WITH ULTRASOUND GUIDANCE;  Surgeon: Almond Lint, MD;  Location: [facility];  Service: General;  Laterality: N/A;   SIMPLE MASTECTOMY WITH AXILLARY SENTINEL NODE BIOPSY Right 03/12/2023   Procedure: RIGHT MASTECTOMY;  Surgeon: Almond Lint, MD;  Location:  [facility]ERY [facility];  Service: General;  Laterality: Right;   Patient Active Problem List   Diagnosis Date Noted   Recurrent breast cancer, left (HCC) 03/12/2023   Atrophy of vagina 09/12/2020   Bilateral lower extremity edema 09/12/2020   History of  ductal carcinoma in situ of breast 09/12/2020   Hyperglycemia due to type 2 diabetes mellitus (HCC) 09/12/2020   Knee pain 09/12/2020   Long term (current) use of insulin (HCC) 09/12/2020   Lumbosacral spondylosis without myelopathy 09/12/2020   Mixed hyperlipidemia 09/12/2020   Obstructive sleep apnea syndrome 09/12/2020   Ovarian cyst 09/12/2020   Overweight 09/12/2020   Personal history of malignant neoplasm of breast 09/12/2020   Postmenopausal bleeding 09/12/2020   Pure hypercholesterolemia 09/12/2020   Sciatica 09/12/2020   Morbid obesity (HCC) 09/12/2020   Chemotherapy-induced peripheral  neuropathy (HCC) 01/22/2018   Encounter for antineoplastic chemotherapy 06/07/2017   Port-A-Cath in place 05/24/2017   Genetic testing 05/23/2017   Malignant neoplasm of upper-outer quadrant of left breast in female, estrogen receptor negative (HCC) 04/22/2017   Essential hypertension 01/30/2015   Diabetes mellitus (HCC) 01/30/2015    REFERRING PROVIDER: Dr. Donell Beers    REFERRING DIAG:  Diagnosis  C50.412,Z17.1 (ICD-10-CM) - Malignant neoplasm of upper-outer quadrant of left breast in female, estrogen receptor negative (HCC)    THERAPY DIAG:  Malignant neoplasm of upper-outer quadrant of left breast in female, estrogen receptor negative (HCC)  Lymphedema, not elsewhere classified  Stiffness of left shoulder, not elsewhere classified  Stiffness of right shoulder, not elsewhere classified  Status post mastectomy, bilateral  ONSET DATE: 12/2022  Rationale for Evaluation and Treatment: Rehabilitation  SUBJECTIVE:                                                                                                                                                                                           SUBJECTIVE STATEMENT:  I had my other infusion. My arm didn't swell as much   PERTINENT HISTORY: New/recurrent breast cancer on the left. 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 Port placement and bil mastectomy 03/12/23 with removal of 2 negative nodes. Treatment in 2018 with lumpectomy, SLNB, chemo and XRT.  1 negative node removed in 2018.   aspirarted frorm Rt and 150 from Lt 04/16/23. Seroma catheters may be needed. Will be doing chemo again.    PAIN:  Are you having pain? The pain is worse when she lies down and moves, but is better when she sits still.   PRECAUTIONS: Lt lymphedema   RED FLAGS: None   WEIGHT BEARING RESTRICTIONS: No  FALLS:  Has patient fallen in last 6 months? No  LIVING ENVIRONMENT: Lives with: lives with their family and lives with their  spouse  OCCUPATION: Retired   LEISURE: nothing really,  get back to water aerobics.    HAND DOMINANCE: right   PRIOR LEVEL OF FUNCTION: Independent  PATIENT GOALS:  get more movement back    OBJECTIVE:  COGNITION: Overall cognitive status: Within functional limits for tasks assessed   PALPATION: sloshing full chest bilateral chest wall with return of seroma - note sent to Dr. Donell Beers   OBSERVATIONS / OTHER ASSESSMENTS: Left incision well healed - Rt incision has a small place covered with a band-aid that is still open and draining - white tissue present.   Lt back of hand and wrist appear larger with less visible landmarks  SENSATION: Numbness left tricep region.   POSTURE: rounded shoulders, forward head    UPPER EXTREMITY AROM/PROM:  A/PROM RIGHT   eval  RIGHT  05/07/2023  Shoulder extension 50   Shoulder flexion 115 - incision 168  Shoulder abduction 90 - pulls in incision 157 no pulling   Shoulder internal rotation    Shoulder external rotation 75     (Blank rows = not tested)  A/PROM LEFT   eval LEFT 05/07/2023 06/04/23 06/25/23  Shoulder extension 50     Shoulder flexion 117 - feels cording like arm pull but not visible 152 ( no pulling)  120 - feels tight upper arm / 145 post stretching  140  Shoulder abduction 85 148 130 - pull in upper arm  150  Shoulder internal rotation      Shoulder external rotation 75       (Blank rows = not tested)  UPPER EXTREMITY STRENGTH:   LYMPHEDEMA ASSESSMENTS:   LANDMARK RIGHT  eval  At axilla    15 cm proximal to olecranon process 39.7  10 cm proximal to olecranon process 38.7  Olecranon process 32.5  15 cm proximal to ulnar styloid process 27.3  10 cm proximal to ulnar styloid process 25.1  Just proximal to ulnar styloid process 18  Across hand at thumb web space 20.5  At base of 2nd digit 6.1  (Blank rows = not tested)  LANDMARK LEFT  eval 06/25/23  At axilla     15 cm proximal to olecranon process 40.5 40.6   10 cm proximal to olecranon process 40.5 39.3  Olecranon process 36.5 35  15 cm proximal to ulnar styloid process 29.2 30.2  10 cm proximal to ulnar styloid process 25.8 26.9  Just proximal to ulnar styloid process 18.8 19.5  Across hand at thumb web space 20.3 22  At base of 2nd digit 5.8 6.3  (Blank rows = not tested) 10cm  Rt: 2981 Lt: 3395 difference  QUICK DASH SURVEY: 45%   TODAY'S TREATMENT:                                                                                                                                          DATE:  06/25/23 Measured ROM and circumferences Due to size continuing to increase with compression circular knit we discussed POC and bandaging vs velcro.  Ordered velcro today on [facility] [facility].  Made a piece of small dot foam on soft foam backing for pt to use in glove with instruction  Also reminded pt to not sleep in this at night.   Pulleys into flexion and abduction x each for stretching and ROM.   06/19/23 Education on donning sleeve. Pt had it on but it was all bunched at the elbow.   Applied with hands and then with butler x 2.  Then PT demonstrated easy slide arm x 1 and gave handouts on both.  Performed seated self MLD education per instruction section with PT demonstrating each step and then pt performing each.  Handout given.   06/04/23 Rechecked ROM Dowel flexion x 5 AROM flexion alternating x 5  Supine c/cc circles with straight arm x 5 each In supine: Short neck, superficial and deep abdominals and diaphragmatic breaths, bil axillary nodes and establishment of interaxillary pathway, then L UE working proximal to distal, moving fluid from upper inner arm outwards, and doing both sides of forearm moving fluid towards pathways spending extra time in any areas of fibrosis then retracing all steps.   05/27/23 In supine: Short neck, superficial and deep abdominals and diaphragmatic breaths, bil axillary nodes and establishment  of interaxillary pathway, then L UE working proximal to distal, moving fluid from upper inner arm outwards, and doing both sides of forearm moving fluid towards pathways spending extra time in any areas of fibrosis then retracing all steps. Cocoa butter used over fingers and hand.    PATIENT EDUCATION:  Education details: per today's note Person educated: Patient Education method: Chief Technology Officer Education comprehension: verbalized understanding, returned demonstration, and needs further education  HOME EXERCISE PROGRAM: Post op breast with wall flexion as an option instead of hands clasped  Self MLD Use of sleeve  ASSESSMENT:  CLINICAL IMPRESSION: Extended POC today and want to be more aggressive with her UE as she has larger circumferences mostly in the forearm and hand.  She is wearing her sleeve and glove as much as possible.  Added foam today for more padding and compression to the back of the hand.  Would like to wrap pt until her velcro arrives but due to clinic availiability she will most likely have her velcro before we can start this.  She will be on a wait list though.    OBJECTIVE IMPAIRMENTS: decreased activity tolerance, decreased knowledge of condition, decreased knowledge of use of DME, decreased ROM, decreased strength, and pain.   ACTIVITY LIMITATIONS: carrying, lifting, and reach over head  PARTICIPATION LIMITATIONS: cleaning, laundry, and community activity  PERSONAL FACTORS: Time since onset of injury/illness/exacerbation and 3+ comorbidities: radiation hx to axilla, SLNB, disease recurrence.    are also affecting patient's functional outcome.   REHAB POTENTIAL: Good  CLINICAL DECISION MAKING: Evolving/moderate complexity  EVALUATION COMPLEXITY: Moderate  GOALS: Goals reviewed with patient? Yes  SHORT TERM GOALS: Target date: 05/07/23  Pt will be educated on lymphedema - physiology, healthy habits, importance of treatment, and treatment options   Baseline: Goal status: MET   LONG TERM GOALS: Target date: 07/10/23  Pt will obtain compression sleeve and glove for use during treatment with education on use Baseline:  Goal status: MET  2.  Pt will improve bil shoulder reach to [facility] to allow for reach into the cabinets Baseline:  Goal status: MET  3.  Pt will be ind with self MLD for the Lt UE  Baseline:  Goal status: IN PROGRESS  4.  Pt will decrease QDASH to  15% or less to demonstrate improved mobility in the UE Baseline:  Goal status: IN PROGRESS  5.  Pt will be ind with final HEP Baseline:  Goal status: IN PROGRESS   PLAN:  PT FREQUENCY: 1-2x/week  PT DURATION: 6 weeks   PLANNED INTERVENTIONS: Therapeutic exercises, Neuromuscular re-education, Patient/Family education, Self Care, DME instructions, Manual therapy, and Re-evaluation  PLAN FOR NEXT SESSION:  Bil shoulder AAROM/PROM as able , MLD Lt UE, bandaging or velcro reduction - may need full CDT  Added: 05/01/23: no coverage at [facility], faxed order to [facility] due to [employer] [place] 05/06/23: from [facility]: she would have to pay OOP but then could get reimbursed by her [employer]? Will let know if pt would like to do this 05/14/23: called [facility] and no coverage is guaranteed so she will not order there.  PT will check and send 1 more place before pt self orders.  She has ordering info.  05/16/23 - entered into [facility] portal 06/04/23; has processed  06/25/23 - entered velcro garments into [facility]     Idamae Lusher, PT 06/25/2023, 11:04 AM [place]

## 2023-06-26 DIAGNOSIS — R197 Diarrhea, unspecified: Secondary | ICD-10-CM | POA: Diagnosis not present

## 2023-06-26 DIAGNOSIS — E1165 Type 2 diabetes mellitus with hyperglycemia: Secondary | ICD-10-CM | POA: Diagnosis not present

## 2023-06-26 DIAGNOSIS — E1142 Type 2 diabetes mellitus with diabetic polyneuropathy: Secondary | ICD-10-CM | POA: Diagnosis not present

## 2023-06-26 DIAGNOSIS — Z794 Long term (current) use of insulin: Secondary | ICD-10-CM | POA: Diagnosis not present

## 2023-06-27 ENCOUNTER — Ambulatory Visit: Payer: Medicare Other

## 2023-06-27 DIAGNOSIS — I890 Lymphedema, not elsewhere classified: Secondary | ICD-10-CM | POA: Diagnosis not present

## 2023-06-27 DIAGNOSIS — C50412 Malignant neoplasm of upper-outer quadrant of left female breast: Secondary | ICD-10-CM | POA: Diagnosis not present

## 2023-06-27 DIAGNOSIS — M25612 Stiffness of left shoulder, not elsewhere classified: Secondary | ICD-10-CM

## 2023-06-27 DIAGNOSIS — Z171 Estrogen receptor negative status [ER-]: Secondary | ICD-10-CM | POA: Diagnosis not present

## 2023-06-27 DIAGNOSIS — M25611 Stiffness of right shoulder, not elsewhere classified: Secondary | ICD-10-CM

## 2023-06-27 DIAGNOSIS — Z9013 Acquired absence of bilateral breasts and nipples: Secondary | ICD-10-CM

## 2023-06-27 NOTE — Therapy (Signed)
OUTPATIENT PHYSICAL THERAPY  UPPER EXTREMITY ONCOLOGY TREATMENT  Patient Name: Audrey Peters MRN: [id number] DOB:[birth year], 67 y.o., female Today's Date: 06/27/2023  END OF SESSION:  PT End of Session - 06/27/23 1113     Visit Number 10    Number of Visits 21    Date for PT Re-Evaluation 08/05/23    Authorization Type none needed    PT Start Time 1114   pt late   PT Stop Time 1159    PT Time Calculation (min) 45 min    Activity Tolerance Patient tolerated treatment well    Behavior During Therapy WFL for tasks assessed/performed               Past Medical History:  Diagnosis Date   Anemia yrs ago   Arthritis    Breast cancer (HCC)    Cancer (HCC)    recent dx in breast   Carpal tunnel syndrome of right wrist    Diabetes mellitus without complication (HCC)    dx 2008   Headache    sinus   Hypertension    Peripheral neuropathy 2024   Hands and Feet   Personal history of chemotherapy    Personal history of radiation therapy    Vaginal delivery 1983   Past Surgical History:  Procedure Laterality Date   BREAST BIOPSY     BREAST BIOPSY Left 01/01/2023   [place] LT BREAST BX W LOC DEV 1ST LESION IMG BX SPEC US GUIDE 01/01/2023 [facility]   BREAST BIOPSY Left 01/01/2023   [place] LT BREAST BX W LOC DEV EA ADD LESION IMG BX SPEC US GUIDE 01/01/2023 [facility]   BREAST LUMPECTOMY Left    BREAST LUMPECTOMY WITH RADIOACTIVE SEED AND SENTINEL LYMPH NODE BIOPSY Left 04/30/2017   Procedure: LEFT BREAST LUMPECTOMY WITH RADIOACTIVE SEED AND LEFT SENTINEL LYMPH NODE BIOPSY ERAS PATHWAY;  Surgeon: Harriette Bouillon, MD;  Location: [facility];  Service: General;  Laterality: Left;   COLONOSCOPY WITH PROPOFOL N/A 05/28/2016   Procedure: COLONOSCOPY WITH PROPOFOL;  Surgeon: Charolett Bumpers, MD;  Location: [facility];  Service: Endoscopy;  Laterality: N/A;   DILATATION & CURETTAGE/HYSTEROSCOPY WITH MYOSURE N/A 04/20/2020   Procedure: DILATATION & CURETTAGE/HYSTEROSCOPY WITH  MYOSURE;  Surgeon: Myna Hidalgo, DO;  Location: [facility];  Service: Gynecology;  Laterality: N/A;   DILATION AND CURETTAGE OF UTERUS     HYSTEROSCOPY WITH D & C N/A 07/21/2015   Procedure: DILATATION AND CURETTAGE /HYSTEROSCOPY with myosure;  Surgeon: Myna Hidalgo, DO;  Location: [facility];  Service: Gynecology;  Laterality: N/A;   MASTECTOMY W/ SENTINEL NODE BIOPSY Left 03/12/2023   Procedure: LEFT MASTECTOMY WITH SENTINEL LYMPH NODE BIOPSY;  Surgeon: Almond Lint, MD;  Location: [facility];  Service: General;  Laterality: Left;   PORTACATH PLACEMENT Right 04/30/2017   Procedure: INSERTION PORT-A-CATH;  Surgeon: Harriette Bouillon, MD;  Location: [facility];  Service: General;  Laterality: Right;   PORTACATH PLACEMENT N/A 03/12/2023   Procedure: PORT PLACEMENT WITH ULTRASOUND GUIDANCE;  Surgeon: Almond Lint, MD;  Location: [facility];  Service: General;  Laterality: N/A;   SIMPLE MASTECTOMY WITH AXILLARY SENTINEL NODE BIOPSY Right 03/12/2023   Procedure: RIGHT MASTECTOMY;  Surgeon: Almond Lint, MD;  Location: [facility];  Service: General;  Laterality: Right;   Patient Active Problem List   Diagnosis Date Noted   Recurrent breast cancer, left (HCC) 03/12/2023   Atrophy of vagina 09/12/2020   Bilateral lower extremity edema 09/12/2020  History of ductal carcinoma in situ of breast 09/12/2020   Hyperglycemia due to type 2 diabetes mellitus (HCC) 09/12/2020   Knee pain 09/12/2020   Long term (current) use of insulin (HCC) 09/12/2020   Lumbosacral spondylosis without myelopathy 09/12/2020   Mixed hyperlipidemia 09/12/2020   Obstructive sleep apnea syndrome 09/12/2020   Ovarian cyst 09/12/2020   Overweight 09/12/2020   Personal history of malignant neoplasm of breast 09/12/2020   Postmenopausal bleeding 09/12/2020   Pure hypercholesterolemia 09/12/2020   Sciatica 09/12/2020   Morbid obesity (HCC) 09/12/2020   Chemotherapy-induced  peripheral neuropathy (HCC) 01/22/2018   Encounter for antineoplastic chemotherapy 06/07/2017   Port-A-Cath in place 05/24/2017   Genetic testing 05/23/2017   Malignant neoplasm of upper-outer quadrant of left breast in female, estrogen receptor negative (HCC) 04/22/2017   Essential hypertension 01/30/2015   Diabetes mellitus (HCC) 01/30/2015    REFERRING PROVIDER: Dr. Donell Beers    REFERRING DIAG:  Diagnosis  C50.412,Z17.1 (ICD-10-CM) - Malignant neoplasm of upper-outer quadrant of left breast in female, estrogen receptor negative (HCC)    THERAPY DIAG:  Malignant neoplasm of upper-outer quadrant of left breast in female, estrogen receptor negative (HCC)  Lymphedema, not elsewhere classified  Stiffness of left shoulder, not elsewhere classified  Stiffness of right shoulder, not elsewhere classified  Status post mastectomy, bilateral  ONSET DATE: 12/2022  Rationale for Evaluation and Treatment: Rehabilitation  SUBJECTIVE:                                                                                                                                                                                           SUBJECTIVE STATEMENT:  I go away from the 19th to the 22nd. I tried the foam piece in my glove yesterday and my hand was better yesterday, but I couldn't get it on well today.   PERTINENT HISTORY: New/recurrent breast cancer on the left. 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 Port placement and bil mastectomy 03/12/23 with removal of 2 negative nodes. Treatment in 2018 with lumpectomy, SLNB, chemo and XRT.  1 negative node removed in 2018.   aspirarted frorm Rt and 150 from Lt 04/16/23. Seroma catheters may be needed. Will be doing chemo again.    PAIN:  Are you having pain? NO  The pain is worse when she lies down and moves, but is better when she sits still.   PRECAUTIONS: Lt lymphedema   RED FLAGS: None   WEIGHT BEARING RESTRICTIONS: No  FALLS:  Has patient fallen  in last 6 months? No  LIVING ENVIRONMENT: Lives with: lives with their family and lives with their spouse  OCCUPATION: Retired  LEISURE: nothing really,  get back to water aerobics.    HAND DOMINANCE: right   PRIOR LEVEL OF FUNCTION: Independent  PATIENT GOALS: get more movement back    OBJECTIVE:  COGNITION: Overall cognitive status: Within functional limits for tasks assessed   PALPATION: sloshing full chest bilateral chest wall with return of seroma - note sent to Dr. Donell Beers   OBSERVATIONS / OTHER ASSESSMENTS: Left incision well healed - Rt incision has a small place covered with a band-aid that is still open and draining - white tissue present.   Lt back of hand and wrist appear larger with less visible landmarks  SENSATION: Numbness left tricep region.   POSTURE: rounded shoulders, forward head    UPPER EXTREMITY AROM/PROM:  A/PROM RIGHT   eval  RIGHT  05/07/2023  Shoulder extension 50   Shoulder flexion 115 - incision 168  Shoulder abduction 90 - pulls in incision 157 no pulling   Shoulder internal rotation    Shoulder external rotation 75     (Blank rows = not tested)  A/PROM LEFT   eval LEFT 05/07/2023 06/04/23 06/25/23  Shoulder extension 50     Shoulder flexion 117 - feels cording like arm pull but not visible 152 ( no pulling)  120 - feels tight upper arm / 145 post stretching  140  Shoulder abduction 85 148 130 - pull in upper arm  150  Shoulder internal rotation      Shoulder external rotation 75       (Blank rows = not tested)  UPPER EXTREMITY STRENGTH:   LYMPHEDEMA ASSESSMENTS:   LANDMARK RIGHT  eval  At axilla    15 cm proximal to olecranon process 39.7  10 cm proximal to olecranon process 38.7  Olecranon process 32.5  15 cm proximal to ulnar styloid process 27.3  10 cm proximal to ulnar styloid process 25.1  Just proximal to ulnar styloid process 18  Across hand at thumb web space 20.5  At base of 2nd digit 6.1  (Blank rows = not  tested)  LANDMARK LEFT  eval 06/25/23  At axilla     15 cm proximal to olecranon process 40.5 40.6  10 cm proximal to olecranon process 40.5 39.3  Olecranon process 36.5 35  15 cm proximal to ulnar styloid process 29.2 30.2  10 cm proximal to ulnar styloid process 25.8 26.9  Just proximal to ulnar styloid process 18.8 19.5  Across hand at thumb web space 20.3 22  At base of 2nd digit 5.8 6.3  (Blank rows = not tested) 10cm  Rt: 2981 Lt: 3395 difference  QUICK DASH SURVEY: 45%   TODAY'S TREATMENT:                                                                                                                                          DATE:  06/27/2023 Pt arrived with sleeve  around elbow and a lot of extra material at wrist. No foam in glove.(Was in a rush today), dorsum of hand swollen Showed pt how to fix sleeve using rubber gloves which she said she has at home In supine: Short neck, superficial and deep abdominals and diaphragmatic breaths, bil axillary nodes and establishment of interaxillary pathway, then L UE working proximal to distal, moving fluid from upper inner arm outwards, and doing both sides of forearm moving fluid towards pathways spending extra time in any areas of fibrosis then retracing all steps.  Pt used Charm Barges to don sleeve and was assisted by PT to get sleeve high enough with rubber glove, assisted with foam pad in glove  06/25/23 Measured ROM and circumferences Due to size continuing to increase with compression circular knit we discussed POC and bandaging vs velcro.  Ordered velcro today on [employer].   Made a piece of small dot foam on soft foam backing for pt to use in glove with instruction  Also reminded pt to not sleep in this at night.   Pulleys into flexion and abduction x each for stretching and ROM.   06/19/23 Education on donning sleeve. Pt had it on but it was all bunched at the elbow.   Applied with hands and then with butler x 2.   Then PT demonstrated easy slide arm x 1 and gave handouts on both.  Performed seated self MLD education per instruction section with PT demonstrating each step and then pt performing each.  Handout given.   06/04/23 Rechecked ROM Dowel flexion x 5 AROM flexion alternating x 5  Supine c/cc circles with straight arm x 5 each In supine: Short neck, superficial and deep abdominals and diaphragmatic breaths, bil axillary nodes and establishment of interaxillary pathway, then L UE working proximal to distal, moving fluid from upper inner arm outwards, and doing both sides of forearm moving fluid towards pathways spending extra time in any areas of fibrosis then retracing all steps.   05/27/23 In supine: Short neck, superficial and deep abdominals and diaphragmatic breaths, bil axillary nodes and establishment of interaxillary pathway, then L UE working proximal to distal, moving fluid from upper inner arm outwards, and doing both sides of forearm moving fluid towards pathways spending extra time in any areas of fibrosis then retracing all steps. Cocoa butter used over fingers and hand.    PATIENT EDUCATION:  Education details: per today's note Person educated: Patient Education method: Chief Technology Officer Education comprehension: verbalized understanding, returned demonstration, and needs further education  HOME EXERCISE PROGRAM: Post op breast with wall flexion as an option instead of hands clasped  Self MLD Use of sleeve  ASSESSMENT:  CLINICAL IMPRESSION:  Pt did not have sleeve on properly and no foam in glove, but when she tried the foam one day it did help. Continued MLD today and education in donning and laundering sleeve. Will start with wrapping/velcro when she returns from her trip. OBJECTIVE IMPAIRMENTS: decreased activity tolerance, decreased knowledge of condition, decreased knowledge of use of DME, decreased ROM, decreased strength, and pain.   ACTIVITY LIMITATIONS:  carrying, lifting, and reach over head  PARTICIPATION LIMITATIONS: cleaning, laundry, and community activity  PERSONAL FACTORS: Time since onset of injury/illness/exacerbation and 3+ comorbidities: radiation hx to axilla, SLNB, disease recurrence.    are also affecting patient's functional outcome.   REHAB POTENTIAL: Good  CLINICAL DECISION MAKING: Evolving/moderate complexity  EVALUATION COMPLEXITY: Moderate  GOALS: Goals reviewed with patient? Yes  SHORT TERM GOALS:  Target date: 05/07/23  Pt will be educated on lymphedema - physiology, healthy habits, importance of treatment, and treatment options  Baseline: Goal status: MET   LONG TERM GOALS: Target date: 07/10/23  Pt will obtain compression sleeve and glove for use during treatment with education on use Baseline:  Goal status: MET  2.  Pt will improve bil shoulder reach to [facility] to allow for reach into the cabinets Baseline:  Goal status: MET  3.  Pt will be ind with self MLD for the Lt UE  Baseline:  Goal status: IN PROGRESS  4.  Pt will decrease QDASH to 15% or less to demonstrate improved mobility in the UE Baseline:  Goal status: IN PROGRESS  5.  Pt will be ind with final HEP Baseline:  Goal status: IN PROGRESS   PLAN:  PT FREQUENCY: 1-2x/week  PT DURATION: 6 weeks   PLANNED INTERVENTIONS: Therapeutic exercises, Neuromuscular re-education, Patient/Family education, Self Care, DME instructions, Manual therapy, and Re-evaluation  PLAN FOR NEXT SESSION:  Bil shoulder AAROM/PROM as able , MLD Lt UE, bandaging or velcro reduction - may need full CDT  Added: 05/01/23: no coverage at [facility], faxed order to [facility] due to [employer] [place] 05/06/23: from [facility]: she would have to pay OOP but then could get reimbursed by her [employer]? Will let know if pt would like to do this 05/14/23: called [facility] and no coverage is guaranteed so she will not order there.  PT will check and send 1 more place before pt  self orders.  She has ordering info.  05/16/23 - entered into [employer] portal 06/04/23; has processed  06/25/23 - entered velcro garments into [employer]     Waynette Buttery, PT 06/27/2023, 12:00 PM [place]

## 2023-07-09 ENCOUNTER — Ambulatory Visit: Payer: Medicare Other | Admitting: Physical Therapy

## 2023-07-09 DIAGNOSIS — Z171 Estrogen receptor negative status [ER-]: Secondary | ICD-10-CM | POA: Diagnosis not present

## 2023-07-09 DIAGNOSIS — Z9013 Acquired absence of bilateral breasts and nipples: Secondary | ICD-10-CM | POA: Diagnosis not present

## 2023-07-09 DIAGNOSIS — I890 Lymphedema, not elsewhere classified: Secondary | ICD-10-CM | POA: Diagnosis not present

## 2023-07-09 DIAGNOSIS — M25611 Stiffness of right shoulder, not elsewhere classified: Secondary | ICD-10-CM | POA: Diagnosis not present

## 2023-07-09 DIAGNOSIS — M25612 Stiffness of left shoulder, not elsewhere classified: Secondary | ICD-10-CM | POA: Diagnosis not present

## 2023-07-09 DIAGNOSIS — C50412 Malignant neoplasm of upper-outer quadrant of left female breast: Secondary | ICD-10-CM | POA: Diagnosis not present

## 2023-07-09 NOTE — Therapy (Signed)
OUTPATIENT PHYSICAL THERAPY  UPPER EXTREMITY ONCOLOGY TREATMENT  Patient Name: Audrey Peters MRN: [id number] DOB:October 14, 1955, 67 y.o., female Today's Date: 07/09/2023  END OF SESSION:  PT End of Session - 07/09/23 0925     Visit Number 11    Number of Visits 21    Date for PT Re-Evaluation 08/05/23    PT Start Time 0921    PT Stop Time 1020    PT Time Calculation (min) 59 min    Activity Tolerance Patient tolerated treatment well    Behavior During Therapy [facility] for tasks assessed/performed               Past Medical History:  Diagnosis Date   Anemia yrs ago   Arthritis    Breast cancer (HCC)    Cancer (HCC)    recent dx in breast   Carpal tunnel syndrome of right wrist    Diabetes mellitus without complication (HCC)    dx 2008   Headache    sinus   Hypertension    Peripheral neuropathy 2024   Hands and Feet   Personal history of chemotherapy    Personal history of radiation therapy    Vaginal delivery 1983   Past Surgical History:  Procedure Laterality Date   BREAST BIOPSY     BREAST BIOPSY Left 01/01/2023   [place] LT BREAST BX W LOC DEV 1ST LESION IMG BX SPEC US GUIDE 01/01/2023 [facility]   BREAST BIOPSY Left 01/01/2023   [place] LT BREAST BX W LOC DEV EA ADD LESION IMG BX SPEC US GUIDE 01/01/2023 [facility]   BREAST LUMPECTOMY Left    BREAST LUMPECTOMY WITH RADIOACTIVE SEED AND SENTINEL LYMPH NODE BIOPSY Left 04/30/2017   Procedure: LEFT BREAST LUMPECTOMY WITH RADIOACTIVE SEED AND LEFT SENTINEL LYMPH NODE BIOPSY ERAS PATHWAY;  Surgeon: Harriette Bouillon, MD;  Location: [facility];  Service: General;  Laterality: Left;   COLONOSCOPY WITH PROPOFOL N/A 05/28/2016   Procedure: COLONOSCOPY WITH PROPOFOL;  Surgeon: Charolett Bumpers, MD;  Location: [facility];  Service: Endoscopy;  Laterality: N/A;   DILATATION & CURETTAGE/HYSTEROSCOPY WITH MYOSURE N/A 04/20/2020   Procedure: DILATATION & CURETTAGE/HYSTEROSCOPY WITH MYOSURE;  Surgeon: Myna Hidalgo, DO;  Location:  [facility];  Service: Gynecology;  Laterality: N/A;   DILATION AND CURETTAGE OF UTERUS     HYSTEROSCOPY WITH D & C N/A 07/21/2015   Procedure: DILATATION AND CURETTAGE /HYSTEROSCOPY with myosure;  Surgeon: Myna Hidalgo, DO;  Location: [facility];  Service: Gynecology;  Laterality: N/A;   MASTECTOMY W/ SENTINEL NODE BIOPSY Left 03/12/2023   Procedure: LEFT MASTECTOMY WITH SENTINEL LYMPH NODE BIOPSY;  Surgeon: Almond Lint, MD;  Location: [facility];  Service: General;  Laterality: Left;   PORTACATH PLACEMENT Right 04/30/2017   Procedure: INSERTION PORT-A-CATH;  Surgeon: Harriette Bouillon, MD;  Location: [facility];  Service: General;  Laterality: Right;   PORTACATH PLACEMENT N/A 03/12/2023   Procedure: PORT PLACEMENT WITH ULTRASOUND GUIDANCE;  Surgeon: Almond Lint, MD;  Location: [facility];  Service: General;  Laterality: N/A;   SIMPLE MASTECTOMY WITH AXILLARY SENTINEL NODE BIOPSY Right 03/12/2023   Procedure: RIGHT MASTECTOMY;  Surgeon: Almond Lint, MD;  Location: [facility];  Service: General;  Laterality: Right;   Patient Active Problem List   Diagnosis Date Noted   Recurrent breast cancer, left (HCC) 03/12/2023   Atrophy of vagina 09/12/2020   Bilateral lower extremity edema 09/12/2020   History of ductal carcinoma in situ of breast 09/12/2020  Hyperglycemia due to type 2 diabetes mellitus (HCC) 09/12/2020   Knee pain 09/12/2020   Long term (current) use of insulin (HCC) 09/12/2020   Lumbosacral spondylosis without myelopathy 09/12/2020   Mixed hyperlipidemia 09/12/2020   Obstructive sleep apnea syndrome 09/12/2020   Ovarian cyst 09/12/2020   Overweight 09/12/2020   Personal history of malignant neoplasm of breast 09/12/2020   Postmenopausal bleeding 09/12/2020   Pure hypercholesterolemia 09/12/2020   Sciatica 09/12/2020   Morbid obesity (HCC) 09/12/2020   Chemotherapy-induced peripheral neuropathy (HCC) 01/22/2018   Encounter  for antineoplastic chemotherapy 06/07/2017   Port-A-Cath in place 05/24/2017   Genetic testing 05/23/2017   Malignant neoplasm of upper-outer quadrant of left breast in female, estrogen receptor negative (HCC) 04/22/2017   Essential hypertension 01/30/2015   Diabetes mellitus (HCC) 01/30/2015    REFERRING PROVIDER: Dr. Donell Beers    REFERRING DIAG:  Diagnosis  C50.412,Z17.1 (ICD-10-CM) - Malignant neoplasm of upper-outer quadrant of left breast in female, estrogen receptor negative (HCC)    THERAPY DIAG:  Malignant neoplasm of upper-outer quadrant of left breast in female, estrogen receptor negative (HCC)  Stiffness of left shoulder, not elsewhere classified  Stiffness of right shoulder, not elsewhere classified  Lymphedema, not elsewhere classified  Status post mastectomy, bilateral  ONSET DATE: 12/2022  Rationale for Evaluation and Treatment: Rehabilitation  SUBJECTIVE:                                                                                                                                                                                           SUBJECTIVE STATEMENT:  Pt states she had recent plane travel to [place] and had a wonderful time. She wore her sleeve.  She says that her hand is still swelling up sometimes better, sometimes worse.  It has not gone completely gone back down since her before her first chemo. She is wearing her compression sleeve and glove every day ,not at night, though she does wear her glove sometimes because her hand aches.  It did this before the surgery because of neuropathy.    PERTINENT HISTORY: New/recurrent breast cancer on the left. 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 Port placement and bil mastectomy 03/12/23 with removal of 2 negative nodes. Treatment in 2018 with lumpectomy, SLNB, chemo and XRT.  1 negative node removed in 2018.   aspirarted frorm Rt and 150 from Lt 04/16/23. Seroma catheters may be needed. Will be doing chemo  again.    PAIN:  Are you having pain? NO  The pain is worse when she lies down and moves, but is better when she sits still.   PRECAUTIONS: Lt lymphedema  RED FLAGS: None   WEIGHT BEARING RESTRICTIONS: No  FALLS:  Has patient fallen in last 6 months? No  LIVING ENVIRONMENT: Lives with: lives with their family and lives with their spouse  OCCUPATION: Retired   LEISURE: nothing really,  get back to water aerobics.    HAND DOMINANCE: right   PRIOR LEVEL OF FUNCTION: Independent  PATIENT GOALS: get more movement back    OBJECTIVE:  COGNITION: Overall cognitive status: Within functional limits for tasks assessed   PALPATION: sloshing full chest bilateral chest wall with return of seroma - note sent to Dr. Donell Beers   OBSERVATIONS / OTHER ASSESSMENTS: Left incision well healed - Rt incision has a small place covered with a band-aid that is still open and draining - white tissue present.   Lt back of hand and wrist appear larger with less visible landmarks  SENSATION: Numbness left tricep region.   POSTURE: rounded shoulders, forward head    UPPER EXTREMITY AROM/PROM:  A/PROM RIGHT   eval  RIGHT  05/07/2023  Shoulder extension 50   Shoulder flexion 115 - incision 168  Shoulder abduction 90 - pulls in incision 157 no pulling   Shoulder internal rotation    Shoulder external rotation 75     (Blank rows = not tested)  A/PROM LEFT   eval LEFT 05/07/2023 06/04/23 06/25/23  Shoulder extension 50     Shoulder flexion 117 - feels cording like arm pull but not visible 152 ( no pulling)  120 - feels tight upper arm / 145 post stretching  140  Shoulder abduction 85 148 130 - pull in upper arm  150  Shoulder internal rotation      Shoulder external rotation 75       (Blank rows = not tested)  UPPER EXTREMITY STRENGTH:   LYMPHEDEMA ASSESSMENTS:   LANDMARK RIGHT  eval  At axilla    15 cm proximal to olecranon process 39.7  10 cm proximal to olecranon process 38.7   Olecranon process 32.5  15 cm proximal to ulnar styloid process 27.3  10 cm proximal to ulnar styloid process 25.1  Just proximal to ulnar styloid process 18  Across hand at thumb web space 20.5  At base of 2nd digit 6.1  (Blank rows = not tested)  LANDMARK LEFT  eval 06/25/23 07/09/23   At axilla      15 cm proximal to olecranon process 40.5 40.6 40  10 cm proximal to olecranon process 40.5 39.3 38.5  Olecranon process 36.5 35 35  15 cm proximal to ulnar styloid process 29.2 30.2 30  10  cm proximal to ulnar styloid process 25.8 26.9 27  Just proximal to ulnar styloid process 18.8 19.5 19  Across hand at thumb web space 20.3 22 22   At base of 2nd digit 5.8 6.3 6.5  (Blank rows = not tested) 10cm  Rt: 2981 Lt: 3395 difference  QUICK DASH SURVEY: 45%   TODAY'S TREATMENT:  DATE:  07/09/2023: Pt comes in wearing sleeve and glove.Hand with visible swelling and pitting edema in hand with fullness in forearm. Remeasured arm.  Performed MLD to left upper quadrant in supine and sidleying with left arm in elevation and extra attention to hand.  Short neck on left side, diaphragmatic breaths and superficial and deep abdominal MLD. Left chest and arm to wrist and hand in elevation incorportating exercise with instruction coordinated with stationary circles  Finished in sidelying for MLD to back and lateral chest. Helped pt reaplly compression sleeve and glove after treatment. Pt reports feeling much better at end of session.  06/27/2023 Pt arrived with sleeve around elbow and a lot of extra material at wrist. No foam in glove.(Was in a rush today), dorsum of hand swollen Showed pt how to fix sleeve using rubber gloves which she said she has at home In supine: Short neck, superficial and deep abdominals and diaphragmatic breaths, bil axillary nodes  and establishment of interaxillary pathway, then L UE working proximal to distal, moving fluid from upper inner arm outwards, and doing both sides of forearm moving fluid towards pathways spending extra time in any areas of fibrosis then retracing all steps.  Pt used Charm Barges to don sleeve and was assisted by PT to get sleeve high enough with rubber glove, assisted with foam pad in glove  06/25/23 Measured ROM and circumferences Due to size continuing to increase with compression circular knit we discussed POC and bandaging vs velcro.  Ordered velcro today on [employer].   Made a piece of small dot foam on soft foam backing for pt to use in glove with instruction  Also reminded pt to not sleep in this at night.   Pulleys into flexion and abduction x each for stretching and ROM.   06/19/23 Education on donning sleeve. Pt had it on but it was all bunched at the elbow.   Applied with hands and then with butler x 2.  Then PT demonstrated easy slide arm x 1 and gave handouts on both.  Performed seated self MLD education per instruction section with PT demonstrating each step and then pt performing each.  Handout given.   06/04/23 Rechecked ROM Dowel flexion x 5 AROM flexion alternating x 5  Supine c/cc circles with straight arm x 5 each In supine: Short neck, superficial and deep abdominals and diaphragmatic breaths, bil axillary nodes and establishment of interaxillary pathway, then L UE working proximal to distal, moving fluid from upper inner arm outwards, and doing both sides of forearm moving fluid towards pathways spending extra time in any areas of fibrosis then retracing all steps.   05/27/23 In supine: Short neck, superficial and deep abdominals and diaphragmatic breaths, bil axillary nodes and establishment of interaxillary pathway, then L UE working proximal to distal, moving fluid from upper inner arm outwards, and doing both sides of forearm moving fluid towards pathways spending  extra time in any areas of fibrosis then retracing all steps. Cocoa butter used over fingers and hand.    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  She responds well to elevation and exercise along  with manual lymph drainage and compression.  She has circular knit sleeve and glove that she is wearing and is waiting for delivery of velco wrap garment.  She will benefit from continued PT throughout chemo to help manage lymphedema and teach management strategies for her to control it at home   OBJECTIVE IMPAIRMENTS: decreased activity tolerance, decreased knowledge of condition, decreased knowledge of use of DME, decreased ROM, decreased strength, and pain.   ACTIVITY LIMITATIONS: carrying, lifting, and reach over head  PARTICIPATION LIMITATIONS: cleaning, laundry, and community activity  PERSONAL FACTORS: Time since onset of injury/illness/exacerbation and 3+ comorbidities: radiation hx to axilla, SLNB, disease recurrence.    are also affecting patient's functional outcome.   REHAB POTENTIAL: Good  CLINICAL DECISION MAKING: Evolving/moderate complexity  EVALUATION COMPLEXITY: Moderate  GOALS: Goals reviewed with patient? Yes  SHORT TERM GOALS: Target date: 05/07/23  Pt will be educated on lymphedema - physiology, healthy habits, importance of treatment, and treatment options  Baseline: Goal status: MET   LONG TERM GOALS: Target date: 07/10/23  Pt will obtain compression sleeve and glove for use during treatment with education on  use Baseline:  Goal status: MET  2.  Pt will improve bil shoulder reach to [facility] to allow for reach into the cabinets Baseline:  Goal status: MET  3.  Pt will be ind with self MLD for the Lt UE  Baseline:  Goal status: IN PROGRESS  4.  Pt will decrease QDASH to 15% or less to demonstrate improved mobility in the UE Baseline:  Goal status: IN PROGRESS  5.  Pt will be ind with final HEP Baseline:  Goal status: IN PROGRESS   PLAN:  PT FREQUENCY: 1-2x/week  PT DURATION: 6 weeks   PLANNED INTERVENTIONS: Therapeutic exercises, Neuromuscular re-education, Patient/Family education, Self Care, DME instructions, Manual therapy, and Re-evaluation  PLAN FOR NEXT SESSION:  Bil shoulder AAROM/PROM as able , MLD Lt UE, bandaging or velcro reduction - may need full CDT.   Added: 05/01/23: no coverage at [facility], faxed order to [facility] due to [employer] [place] 05/06/23: from [facility]: she would have to pay OOP but then could get reimbursed by her BCBS? Will let know if pt would like to do this 05/14/23: called [facility] and no coverage is guaranteed so she will not order there.  PT will check and send 1 more place before pt self orders.  She has ordering info.  05/16/23 - entered into [employer] portal 06/04/23; has processed  06/25/23 - entered velcro garments into [employer]    Rosey Bath K. Manson Passey PT  Donnetta Hail, PT 07/09/2023, 10:25 AM [place]    [facility] [address] [place], [place], [address] Phone: [phone]   Fax:  [phone]  Patient Details  Name: Audrey Peters MRN: [id number] Date of Birth: 01/13/1956 Referring Provider:  Almond Lint, MD  Encounter Date: 07/09/2023   Donnetta Hail, PT 07/09/2023, 10:25 AM  [facility] [name] [place], [place], [address] Phone: [phone]   Fax:  [phone]

## 2023-07-10 ENCOUNTER — Inpatient Hospital Stay: Payer: BC Managed Care – PPO

## 2023-07-10 ENCOUNTER — Inpatient Hospital Stay (HOSPITAL_BASED_OUTPATIENT_CLINIC_OR_DEPARTMENT_OTHER): Payer: BC Managed Care – PPO | Admitting: Hematology and Oncology

## 2023-07-10 VITALS — BP 123/77 | HR 71 | Resp 16

## 2023-07-10 VITALS — BP 129/69 | HR 79 | Temp 97.7°F | Resp 16 | Wt 228.6 lb

## 2023-07-10 DIAGNOSIS — Z171 Estrogen receptor negative status [ER-]: Secondary | ICD-10-CM

## 2023-07-10 DIAGNOSIS — C50412 Malignant neoplasm of upper-outer quadrant of left female breast: Secondary | ICD-10-CM

## 2023-07-10 DIAGNOSIS — D6481 Anemia due to antineoplastic chemotherapy: Secondary | ICD-10-CM | POA: Diagnosis not present

## 2023-07-10 DIAGNOSIS — Z95828 Presence of other vascular implants and grafts: Secondary | ICD-10-CM

## 2023-07-10 DIAGNOSIS — Z5111 Encounter for antineoplastic chemotherapy: Secondary | ICD-10-CM | POA: Diagnosis not present

## 2023-07-10 LAB — CBC WITH DIFFERENTIAL (CANCER CENTER ONLY)
Abs Immature Granulocytes: 0.11 10*3/uL — ABNORMAL HIGH (ref 0.00–0.07)
Basophils Absolute: 0 10*3/uL (ref 0.0–0.1)
Basophils Relative: 0 %
Eosinophils Absolute: 0.1 10*3/uL (ref 0.0–0.5)
Eosinophils Relative: 2 %
HCT: 34.5 % — ABNORMAL LOW (ref 36.0–46.0)
Hemoglobin: 10.7 g/dL — ABNORMAL LOW (ref 12.0–15.0)
Immature Granulocytes: 3 %
Lymphocytes Relative: 43 %
Lymphs Abs: 1.7 10*3/uL (ref 0.7–4.0)
MCH: 27.8 pg (ref 26.0–34.0)
MCHC: 31 g/dL (ref 30.0–36.0)
MCV: 89.6 fL (ref 80.0–100.0)
Monocytes Absolute: 0.7 10*3/uL (ref 0.1–1.0)
Monocytes Relative: 19 %
Neutro Abs: 1.3 10*3/uL — ABNORMAL LOW (ref 1.7–7.7)
Neutrophils Relative %: 33 %
Platelet Count: 189 10*3/uL (ref 150–400)
RBC: 3.85 MIL/uL — ABNORMAL LOW (ref 3.87–5.11)
RDW: 14.6 % (ref 11.5–15.5)
WBC Count: 3.8 10*3/uL — ABNORMAL LOW (ref 4.0–10.5)
nRBC: 0.5 % — ABNORMAL HIGH (ref 0.0–0.2)

## 2023-07-10 LAB — CMP (CANCER CENTER ONLY)
ALT: 32 U/L (ref 0–44)
AST: 44 U/L — ABNORMAL HIGH (ref 15–41)
Albumin: 3.8 g/dL (ref 3.5–5.0)
Alkaline Phosphatase: 79 U/L (ref 38–126)
Anion gap: 5 (ref 5–15)
BUN: 21 mg/dL (ref 8–23)
CO2: 29 mmol/L (ref 22–32)
Calcium: 9.3 mg/dL (ref 8.9–10.3)
Chloride: 106 mmol/L (ref 98–111)
Creatinine: 0.87 mg/dL (ref 0.44–1.00)
GFR, Estimated: >60 mL/min (ref >60)
Glucose, Bld: 91 mg/dL (ref 70–99)
Potassium: 3.7 mmol/L (ref 3.5–5.1)
Sodium: 140 mmol/L (ref 135–145)
Total Bilirubin: 0.4 mg/dL (ref <1.2)
Total Protein: 7 g/dL (ref 6.5–8.1)

## 2023-07-10 MED ORDER — FLUOROURACIL CHEMO INJECTION 2.5 GM/50ML
600.0000 mg/m2 | Freq: Once | INTRAVENOUS | Status: AC
  Administered 2023-07-10: 1250 mg via INTRAVENOUS
  Filled 2023-07-10: qty 25

## 2023-07-10 MED ORDER — METHOTREXATE SODIUM CHEMO INJECTION (PF) 50 MG/2ML
40.0000 mg/m2 | Freq: Once | INTRAMUSCULAR | Status: AC
Start: 2023-07-10 — End: 2023-07-10
  Administered 2023-07-10: 83.5 mg via INTRAVENOUS
  Filled 2023-07-10: qty 3.34

## 2023-07-10 MED ORDER — SODIUM CHLORIDE 0.9 % IV SOLN
Freq: Once | INTRAVENOUS | Status: AC
Start: 2023-07-10 — End: 2023-07-10

## 2023-07-10 MED ORDER — SODIUM CHLORIDE 0.9% FLUSH
10.0000 mL | INTRAVENOUS | Status: DC | PRN
  Administered 2023-07-10: 10 mL

## 2023-07-10 MED ORDER — PALONOSETRON HCL INJECTION 0.25 MG/5ML
0.2500 mg | Freq: Once | INTRAVENOUS | Status: AC
  Administered 2023-07-10: 0.25 mg via INTRAVENOUS
  Filled 2023-07-10: qty 5

## 2023-07-10 MED ORDER — HEPARIN SOD (PORK) LOCK FLUSH 100 UNIT/ML IV SOLN
500.0000 [IU] | Freq: Once | INTRAVENOUS | Status: AC | PRN
  Administered 2023-07-10: 500 [IU]

## 2023-07-10 MED ORDER — SODIUM CHLORIDE 0.9% FLUSH
10.0000 mL | INTRAVENOUS | Status: DC | PRN
  Administered 2023-07-10: 10 mL via INTRAVENOUS

## 2023-07-10 MED ORDER — DEXAMETHASONE SODIUM PHOSPHATE 10 MG/ML IJ SOLN
4.0000 mg | Freq: Once | INTRAMUSCULAR | Status: AC
  Administered 2023-07-10: 4 mg via INTRAVENOUS
  Filled 2023-07-10: qty 1

## 2023-07-10 MED ORDER — SODIUM CHLORIDE 0.9 % IV SOLN
600.0000 mg/m2 | Freq: Once | INTRAVENOUS | Status: AC
  Administered 2023-07-10: 1260 mg via INTRAVENOUS
  Filled 2023-07-10: qty 63

## 2023-07-10 NOTE — Progress Notes (Signed)
Ok per Dr. Pamelia Hoit to proceed with today's treatment with ANC of 1.3.

## 2023-07-10 NOTE — Patient Instructions (Signed)
[facility] - A DEPT OF [facility]  Discharge Instructions: Thank you for choosing [facility] to provide your oncology and hematology care.   If you have a lab appointment with the Cancer Center, please go directly to the Cancer Center and check in at the registration area.   Wear comfortable clothing and clothing appropriate for easy access to any Portacath or PICC line.   We strive to give you quality time with your provider. You may need to reschedule your appointment if you arrive late (15 or more minutes).  Arriving late affects you and other patients whose appointments are after yours.  Also, if you miss three or more appointments without notifying the office, you may be dismissed from the clinic at the provider's discretion.      For prescription refill requests, have your pharmacy contact our office and allow 72 hours for refills to be completed.    Today you received the following chemotherapy and/or immunotherapy agents: Cytoxan, Methotrexate, Fluorouracil.       To help prevent nausea and vomiting after your treatment, we encourage you to take your nausea medication as directed.  BELOW ARE SYMPTOMS THAT SHOULD BE REPORTED IMMEDIATELY: *FEVER GREATER THAN 100.4 F (38 C) OR HIGHER *CHILLS OR SWEATING *NAUSEA AND VOMITING THAT IS NOT CONTROLLED WITH YOUR NAUSEA MEDICATION *UNUSUAL SHORTNESS OF BREATH *UNUSUAL BRUISING OR BLEEDING *URINARY PROBLEMS (pain or burning when urinating, or frequent urination) *BOWEL PROBLEMS (unusual diarrhea, constipation, pain near the anus) TENDERNESS IN MOUTH AND THROAT WITH OR WITHOUT PRESENCE OF ULCERS (sore throat, sores in mouth, or a toothache) UNUSUAL RASH, SWELLING OR PAIN  UNUSUAL VAGINAL DISCHARGE OR ITCHING   Items with * indicate a potential emergency and should be followed up as soon as possible or go to the Emergency Department if any problems should occur.  Please show the CHEMOTHERAPY  ALERT CARD or IMMUNOTHERAPY ALERT CARD at check-in to the Emergency Department and triage nurse.  Should you have questions after your visit or need to cancel or reschedule your appointment, please contact [facility] - A DEPT OF [facility]  Dept: [phone]  and follow the prompts.  Office hours are 8:00 a.m. to 4:30 p.m. Monday - Friday. Please note that voicemails left after 4:00 p.m. may not be returned until the following business day.  We are closed weekends and major holidays. You have access to a nurse at all times for urgent questions. Please call the main number to the clinic Dept: [phone] and follow the prompts.   For any non-urgent questions, you may also contact your provider using MyChart. We now offer e-Visits for anyone 69 and older to request care online for non-urgent symptoms. For details visit [email or web address].   Also download the MyChart app! Go to the app store, search "MyChart", open the app, select [facility], and log in with your MyChart username and password.

## 2023-07-10 NOTE — Progress Notes (Signed)
Patient Care Team: [name], MD as PCP - General (Hematology and Oncology) [name], MD as Consulting Physician (Hematology and Oncology) Lonie Peak, MD as Attending Physician (Radiation Oncology) Harriette Bouillon, MD as Consulting Physician (General Surgery) Axel Filler Larna Daughters, NP as Nurse Practitioner (Hematology and Oncology)  DIAGNOSIS:  Encounter Diagnosis  Name Primary?   Malignant neoplasm of upper-outer quadrant of left breast in female, estrogen receptor negative (HCC) Yes    SUMMARY OF ONCOLOGIC HISTORY: Oncology History  Malignant neoplasm of upper-outer quadrant of left breast in female, estrogen receptor negative (HCC)  04/05/2017 Initial Diagnosis   Left breast asymmetry by ultrasound measured 1.3 cm at 2:30 position 10 cm from nipple, no axillary lymph nodes; biopsy IDC grade 2, ER 0%, PR 0%, HER-2 negative ratio 1.37, Ki-67 40%, T1c N0 stage IB AJCC 8    04/30/2017 Surgery   Left lumpectomy: IDC grade 3, 1.7 cm, DCIS, lymphovascular invasion present, margins negative, 0/1 lymph node negative, ER 0%, PR 0%, HER-2 negative ratio 1.37, Ki-67 40%, T1c N0 stage IB   05/22/2017 Genetic Testing   Patient had genetic testing due to a personal history of triple negative breast cancer.  The Common Hereditary Cancer Panel was ordered. The Hereditary Gene Panel offered by [facility] includes sequencing and/or deletion duplication testing of the following 46 genes: APC, ATM, AXIN2, BARD1, BMPR1A, BRCA1, BRCA2, BRIP1, CDH1, CDKN2A (p14ARF), CDKN2A (p16INK4a), CHEK2, CTNNA1, DICER1, EPCAM (Deletion/duplication testing only), GREM1 (promoter region deletion/duplication testing only), KIT, MEN1, MLH1, MSH2, MSH3, MSH6, MUTYH, NBN, NF1, NHTL1, PALB2, PDGFRA, PMS2, POLD1, POLE, PTEN, RAD50, RAD51C, RAD51D, SDHB, SDHC, SDHD, SMAD4, SMARCA4. STK11, TP53, TSC1, TSC2, and VHL.  The following genes were evaluated for sequence changes only: SDHA and HOXB13 c.251G>A variant only.     Results: No pathogenic mutations identified.  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  The date of this test report is 05/22/2017.    05/24/2017 - 08/30/2017 Chemotherapy   Dose dense Adriamycin and Cytoxan 4 followed by Taxol weekly 5 (stopped early for neuropathy)    09/26/2017 - 10/22/2017 Radiation Therapy   Adjuvant radiation therapy   12/10/2022 Relapse/Recurrence   Mammogram detected distortion, 2 irregular masses 1.9 cm and 1.2 cm: Biopsy grade 2 IDC with DCIS triple negative, scans negative, MRI breast: 6.1 cm   03/12/2023 Surgery   Bilateral mastectomies: Right breast: Benign  left mastectomy: Grade 3 IDC 6 cm with high-grade DCIS, margins negative, 1 intramammary lymph node negative, 0/2 sentinel lymph nodes, ER 0%, PR 0%, HER2 0, Ki-67 30%   05/09/2023 -  Chemotherapy   Patient is on Treatment Plan : BREAST Adjuvant CMF IV q21d       CHIEF COMPLIANT: Cycle 3 CMF  HISTORY OF PRESENT ILLNESS:   History of Present Illness   The patient, with a history of breast cancer on CMF chemo, C/O lymphedema and fatigue and swelling in the hand. She reports that the swelling causes tightness in the fingers. She has been wearing a compression garment throughout the day and attending physical therapy, which has helped to reduce the swelling. She also reports that she has been experiencing numbness and stiffness in the hand, particularly around three o'clock in the morning. She has previously had carpal tunnel syndrome in the other hand.         ALLERGIES:  is allergic to canagliflozin, empagliflozin, other, sulfa antibiotics, and ciprofloxacin.  MEDICATIONS:  Current Outpatient Medications  Medication Sig Dispense Refill   acetaminophen (TYLENOL) 650 MG CR  tablet Take 650 mg by mouth every 8 (eight) hours as needed for pain.     atorvastatin (LIPITOR) 10 MG tablet 1 tablet     clotrimazole-betamethasone (LOTRISONE) cream      Continuous Blood Gluc Sensor (FREESTYLE  LIBRE 2 SENSOR) MISC .     DULoxetine (CYMBALTA) 30 MG capsule Take 30 mg by mouth daily.     gabapentin (NEURONTIN) 100 MG capsule Take 100 mg by mouth 3 (three) times daily.     glucose blood (ONETOUCH VERIO) test strip use to check blood sugar     glucose blood test strip by miscellaneous route.     Insulin Pen Needle (B-D ULTRAFINE III SHORT PEN) 31G X 8 MM MISC 3 (three) times daily.     insulin regular human CONCENTRATED (HUMULIN R U-500 KWIKPEN) 500 UNIT/ML kwikpen 150 units before breakfast, 80 units before lunch, 80 units before evening meal     lidocaine-prilocaine (EMLA) cream Apply to affected area once 30 g 3   losartan (COZAAR) 100 MG tablet Take 100 mg by mouth daily.     metFORMIN (GLUCOPHAGE-XR) 500 MG 24 hr tablet 2 tablets with evening meal     methocarbamol (ROBAXIN) 500 MG tablet Take 1 tablet (500 mg total) by mouth every 6 (six) hours as needed for muscle spasms. 20 tablet 3   ondansetron (ZOFRAN) 8 MG tablet Take 1 tablet (8 mg total) by mouth every 8 (eight) hours as needed for nausea or vomiting. Start on the third day after chemotherapy. 30 tablet 1   ONETOUCH VERIO test strip 2 (two) times daily.     oxyCODONE (OXY IR/ROXICODONE) 5 MG immediate release tablet Take 1 tablet (5 mg total) by mouth every 4 (four) hours as needed for moderate pain. 30 tablet 0   pregabalin (LYRICA) 150 MG capsule 1 capsule     prochlorperazine (COMPAZINE) 10 MG tablet Take 1 tablet (10 mg total) by mouth every 6 (six) hours as needed for nausea or vomiting. 30 tablet 1   Semaglutide,0.25 or 0.5MG /DOS, 2 MG/1.5ML SOPN Inject 0.25 mg into the skin once a week.     senna (SENOKOT) 8.6 MG TABS tablet Take 1 tablet (8.6 mg total) by mouth 2 (two) times daily. 60 tablet 0   triamterene-hydrochlorothiazide (MAXZIDE-25) 37.5-25 MG tablet Take 1 tablet by mouth every morning.     No current facility-administered medications for this visit.    PHYSICAL EXAMINATION: ECOG PERFORMANCE STATUS: 1 -  Symptomatic but completely ambulatory  Vitals:   07/10/23 1019  BP: 129/69  Pulse: 79  Resp: 16  Temp: 97.7 F (36.5 C)  SpO2: 99%   Filed Weights   07/10/23 1019  Weight: 228 lb 9.6 oz (103.7 kg)      LABORATORY DATA:  I have reviewed the data as listed    Latest Ref Rng & Units 06/20/2023    8:20 AM 05/30/2023    9:15 AM 05/09/2023    8:11 AM  CMP  Glucose 70 - 99 mg/dL 161  096  045   BUN 8 - 23 mg/dL 26  35  28   Creatinine 0.44 - 1.00 mg/dL 4.09  8.11  9.14   Sodium 135 - 145 mmol/L 138  139  140   Potassium 3.5 - 5.1 mmol/L 3.7  3.8  3.9   Chloride 98 - 111 mmol/L 104  104  106   CO2 22 - 32 mmol/L 26  25  25    Calcium 8.9 - 10.3 mg/dL  9.3  9.2  8.8   Total Protein 6.5 - 8.1 g/dL 6.9  7.0  6.9   Total Bilirubin <1.2 mg/dL 0.4  0.4  0.4   Alkaline Phos 38 - 126 U/L 60  66  73   AST 15 - 41 U/L 38  41  48   ALT 0 - 44 U/L 30  33  32     Lab Results  Component Value Date   WBC 3.8 (L) 07/10/2023   HGB 10.7 (L) 07/10/2023   HCT 34.5 (L) 07/10/2023   MCV 89.6 07/10/2023   PLT 189 07/10/2023   NEUTROABS 1.3 (L) 07/10/2023    ASSESSMENT & PLAN:  Malignant neoplasm of upper-outer quadrant of left breast in female, estrogen receptor negative (HCC) 04/30/2017: Left lumpectomy: IDC grade 3, 1.7 cm, DCIS, lymphovascular invasion present, margins negative, 0/1 lymph node negative, ER 0%, PR 0%, HER-2 negative ratio 1.37, Ki-67 40%, T1c N0 stage IB    Treatment summary: 1. adjuvant chemotherapy with dose dense Adriamycin and Cytoxan 4 followed by Taxol weekly 6 discontinued for neuropathy 2. Followed by radiation started 09/26/2017-10/25/2018  Breast cancer recurrence: mammogram 12/10/2022: Asymmetry/distortion left breast, ultrasound: 2 adjacent irregular masses 2 o'clock position left breast 1.9 cm and 1.2 cm, no suspicious lymph nodes Left breast biopsy 2:00 posterior: Grade 2 IDC with DCIS Left breast biopsy 2:00 anterior: Grade 2 IDC with DCIS ER 0%, PR 0%, HER2  0, Ki-67 30% 01/31/2023: Bone scan: Negative, CT CAP 01/26/2023: Negative for metastatic disease but nodular contour of the liver suggestive of cirrhosis 01/26/2023: Ultrasound left submandibular neck: Normal lymph node 1.2 cm 01/24/2023: MRI breast: The recently biopsied area measured 6.1 cm, skin thickening (post radiation)   Recommendation: 03/12/2023: Bilateral mastectomies: Right breast: Benign left mastectomy: Grade 3 IDC 6 cm with high-grade DCIS, margins negative, 1 intramammary lymph node negative, 0/2 sentinel lymph nodes, ER 0%, PR 0%, HER2 0, Ki-67 30% Followed by adjuvant chemotherapy with CMF x 6 cycles started 05/09/2023 ------------------------------------------------------------------------------------------------------------------------------------------------ Current treatment: Cycle 3 CMF   Chemo toxicities: Fatigue Chemotherapy-induced anemia: Mild Lymphedema left hand: Patient is working with physical therapy.      Anemia Hemoglobin decreased from 11.4 to 10.7, likely contributing to reported fatigue. -Monitor hemoglobin levels.  Hyperglycemia Last recorded blood glucose level was 239. -Await current blood glucose results. Continue current management plan.  Return to clinic in 3 weeks for cycle 4         No orders of the defined types were placed in this encounter.  The patient has a good understanding of the overall plan. she agrees with it. she will call with any problems that may develop before the next visit here. Total time spent: 30 mins including face to face time and time spent for planning, charting and co-ordination of care   Tamsen Meek, MD 07/10/23

## 2023-07-10 NOTE — Assessment & Plan Note (Addendum)
04/30/2017: Left lumpectomy: IDC grade 3, 1.7 cm, DCIS, lymphovascular invasion present, margins negative, 0/1 lymph node negative, ER 0%, PR 0%, HER-2 negative ratio 1.37, Ki-67 40%, T1c N0 stage IB    Treatment summary: 1. adjuvant chemotherapy with dose dense Adriamycin and Cytoxan 4 followed by Taxol weekly 6 discontinued for neuropathy 2. Followed by radiation started 09/26/2017-10/25/2018  Breast cancer recurrence: mammogram 12/10/2022: Asymmetry/distortion left breast, ultrasound: 2 adjacent irregular masses 2 o'clock position left breast 1.9 cm and 1.2 cm, no suspicious lymph nodes Left breast biopsy 2:00 posterior: Grade 2 IDC with DCIS Left breast biopsy 2:00 anterior: Grade 2 IDC with DCIS ER 0%, PR 0%, HER2 0, Ki-67 30% 01/31/2023: Bone scan: Negative, CT CAP 01/26/2023: Negative for metastatic disease but nodular contour of the liver suggestive of cirrhosis 01/26/2023: Ultrasound left submandibular neck: Normal lymph node 1.2 cm 01/24/2023: MRI breast: The recently biopsied area measured 6.1 cm, skin thickening (post radiation)   Recommendation: 03/12/2023: Bilateral mastectomies: Right breast: Benign left mastectomy: Grade 3 IDC 6 cm with high-grade DCIS, margins negative, 1 intramammary lymph node negative, 0/2 sentinel lymph nodes, ER 0%, PR 0%, HER2 0, Ki-67 30% Followed by adjuvant chemotherapy with CMF x 6 cycles started 05/09/2023 ------------------------------------------------------------------------------------------------------------------------------------------------ Current treatment: Cycle 3 CMF   Chemo toxicities: Fatigue Chemotherapy-induced anemia: Mild Lymphedema left hand: Patient is working with physical therapy.      Return to clinic in 3 weeks for cycle 4

## 2023-07-12 DIAGNOSIS — I972 Postmastectomy lymphedema syndrome: Secondary | ICD-10-CM | POA: Diagnosis not present

## 2023-07-15 DIAGNOSIS — C50412 Malignant neoplasm of upper-outer quadrant of left female breast: Secondary | ICD-10-CM | POA: Diagnosis not present

## 2023-07-15 DIAGNOSIS — N6489 Other specified disorders of breast: Secondary | ICD-10-CM | POA: Diagnosis not present

## 2023-07-15 DIAGNOSIS — Z171 Estrogen receptor negative status [ER-]: Secondary | ICD-10-CM | POA: Diagnosis not present

## 2023-07-16 ENCOUNTER — Ambulatory Visit: Payer: Medicare Other | Attending: General Surgery

## 2023-07-16 DIAGNOSIS — M25612 Stiffness of left shoulder, not elsewhere classified: Secondary | ICD-10-CM | POA: Insufficient documentation

## 2023-07-16 DIAGNOSIS — Z171 Estrogen receptor negative status [ER-]: Secondary | ICD-10-CM | POA: Insufficient documentation

## 2023-07-16 DIAGNOSIS — M25611 Stiffness of right shoulder, not elsewhere classified: Secondary | ICD-10-CM | POA: Diagnosis not present

## 2023-07-16 DIAGNOSIS — Z9013 Acquired absence of bilateral breasts and nipples: Secondary | ICD-10-CM | POA: Insufficient documentation

## 2023-07-16 DIAGNOSIS — C50412 Malignant neoplasm of upper-outer quadrant of left female breast: Secondary | ICD-10-CM | POA: Insufficient documentation

## 2023-07-16 DIAGNOSIS — I890 Lymphedema, not elsewhere classified: Secondary | ICD-10-CM | POA: Insufficient documentation

## 2023-07-16 NOTE — Therapy (Signed)
OUTPATIENT PHYSICAL THERAPY  UPPER EXTREMITY ONCOLOGY TREATMENT  Patient Name: Audrey Peters MRN: [id number] DOB:04/06/1956, 67 y.o., female Today's Date: 07/16/2023  END OF SESSION:  PT End of Session - 07/16/23 1015     Visit Number 12    Number of Visits 21    Date for PT Re-Evaluation 08/05/23    PT Start Time 1013   pt arrived late   PT Stop Time 1058    PT Time Calculation (min) 45 min    Activity Tolerance Patient tolerated treatment well    Behavior During Therapy WFL for tasks assessed/performed               Past Medical History:  Diagnosis Date   Anemia yrs ago   Arthritis    Breast cancer (HCC)    Cancer (HCC)    recent dx in breast   Carpal tunnel syndrome of right wrist    Diabetes mellitus without complication (HCC)    dx 2008   Headache    sinus   Hypertension    Peripheral neuropathy 2024   Hands and Feet   Personal history of chemotherapy    Personal history of radiation therapy    Vaginal delivery 1983   Past Surgical History:  Procedure Laterality Date   BREAST BIOPSY     BREAST BIOPSY Left 01/01/2023   [place] LT BREAST BX W LOC DEV 1ST LESION IMG BX SPEC US GUIDE 01/01/2023 [facility]   BREAST BIOPSY Left 01/01/2023   [place] LT BREAST BX W LOC DEV EA ADD LESION IMG BX SPEC US GUIDE 01/01/2023 [facility]   BREAST LUMPECTOMY Left    BREAST LUMPECTOMY WITH RADIOACTIVE SEED AND SENTINEL LYMPH NODE BIOPSY Left 04/30/2017   Procedure: LEFT BREAST LUMPECTOMY WITH RADIOACTIVE SEED AND LEFT SENTINEL LYMPH NODE BIOPSY ERAS PATHWAY;  Surgeon: Harriette Bouillon, MD;  Location: [facility];  Service: General;  Laterality: Left;   COLONOSCOPY WITH PROPOFOL N/A 05/28/2016   Procedure: COLONOSCOPY WITH PROPOFOL;  Surgeon: Charolett Bumpers, MD;  Location: [facility];  Service: Endoscopy;  Laterality: N/A;   DILATATION & CURETTAGE/HYSTEROSCOPY WITH MYOSURE N/A 04/20/2020   Procedure: DILATATION & CURETTAGE/HYSTEROSCOPY WITH MYOSURE;  Surgeon: Myna Hidalgo,  DO;  Location: [facility];  Service: Gynecology;  Laterality: N/A;   DILATION AND CURETTAGE OF UTERUS     HYSTEROSCOPY WITH D & C N/A 07/21/2015   Procedure: DILATATION AND CURETTAGE /HYSTEROSCOPY with myosure;  Surgeon: Myna Hidalgo, DO;  Location: [facility];  Service: Gynecology;  Laterality: N/A;   MASTECTOMY W/ SENTINEL NODE BIOPSY Left 03/12/2023   Procedure: LEFT MASTECTOMY WITH SENTINEL LYMPH NODE BIOPSY;  Surgeon: Almond Lint, MD;  Location: [facility];  Service: General;  Laterality: Left;   PORTACATH PLACEMENT Right 04/30/2017   Procedure: INSERTION PORT-A-CATH;  Surgeon: Harriette Bouillon, MD;  Location: [facility];  Service: General;  Laterality: Right;   PORTACATH PLACEMENT N/A 03/12/2023   Procedure: PORT PLACEMENT WITH ULTRASOUND GUIDANCE;  Surgeon: Almond Lint, MD;  Location: [facility];  Service: General;  Laterality: N/A;   SIMPLE MASTECTOMY WITH AXILLARY SENTINEL NODE BIOPSY Right 03/12/2023   Procedure: RIGHT MASTECTOMY;  Surgeon: Almond Lint, MD;  Location: [facility];  Service: General;  Laterality: Right;   Patient Active Problem List   Diagnosis Date Noted   Recurrent breast cancer, left (HCC) 03/12/2023   Atrophy of vagina 09/12/2020   Bilateral lower extremity edema 09/12/2020   History of ductal carcinoma in  situ of breast 09/12/2020   Hyperglycemia due to type 2 diabetes mellitus (HCC) 09/12/2020   Knee pain 09/12/2020   Long term (current) use of insulin (HCC) 09/12/2020   Lumbosacral spondylosis without myelopathy 09/12/2020   Mixed hyperlipidemia 09/12/2020   Obstructive sleep apnea syndrome 09/12/2020   Ovarian cyst 09/12/2020   Overweight 09/12/2020   Personal history of malignant neoplasm of breast 09/12/2020   Postmenopausal bleeding 09/12/2020   Pure hypercholesterolemia 09/12/2020   Sciatica 09/12/2020   Morbid obesity (HCC) 09/12/2020   Chemotherapy-induced peripheral neuropathy (HCC) 01/22/2018    Encounter for antineoplastic chemotherapy 06/07/2017   Port-A-Cath in place 05/24/2017   Genetic testing 05/23/2017   Malignant neoplasm of upper-outer quadrant of left breast in female, estrogen receptor negative (HCC) 04/22/2017   Essential hypertension 01/30/2015   Diabetes mellitus (HCC) 01/30/2015    REFERRING PROVIDER: Dr. Donell Beers    REFERRING DIAG:  Diagnosis  C50.412,Z17.1 (ICD-10-CM) - Malignant neoplasm of upper-outer quadrant of left breast in female, estrogen receptor negative (HCC)    THERAPY DIAG:  Malignant neoplasm of upper-outer quadrant of left breast in female, estrogen receptor negative (HCC)  Stiffness of left shoulder, not elsewhere classified  Stiffness of right shoulder, not elsewhere classified  Lymphedema, not elsewhere classified  Status post mastectomy, bilateral  ONSET DATE: 12/2022  Rationale for Evaluation and Treatment: Rehabilitation  SUBJECTIVE:                                                                                                                                                                                           SUBJECTIVE STATEMENT:  My velcro garment came in. I want you to show me how to put it on.    PERTINENT HISTORY: New/recurrent breast cancer on the left. 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 Port placement and bil mastectomy 03/12/23 with removal of 2 negative nodes. Treatment in 2018 with lumpectomy, SLNB, chemo and XRT.  1 negative node removed in 2018.   aspirarted frorm Rt and 150 from Lt 04/16/23. Seroma catheters may be needed. Will be doing chemo again.    PAIN:  Are you having pain? NO  The pain is worse when she lies down and moves, but is better when she sits still.   PRECAUTIONS: Lt lymphedema   RED FLAGS: None   WEIGHT BEARING RESTRICTIONS: No  FALLS:  Has patient fallen in last 6 months? No  LIVING ENVIRONMENT: Lives with: lives with their family and lives with their spouse  OCCUPATION:  Retired   LEISURE: nothing really,  get back to water aerobics.    HAND DOMINANCE: right   PRIOR LEVEL OF  FUNCTION: Independent  PATIENT GOALS: get more movement back    OBJECTIVE:  COGNITION: Overall cognitive status: Within functional limits for tasks assessed   PALPATION: sloshing full chest bilateral chest wall with return of seroma - note sent to Dr. Donell Beers   OBSERVATIONS / OTHER ASSESSMENTS: Left incision well healed - Rt incision has a small place covered with a band-aid that is still open and draining - white tissue present.   Lt back of hand and wrist appear larger with less visible landmarks  SENSATION: Numbness left tricep region.   POSTURE: rounded shoulders, forward head    UPPER EXTREMITY AROM/PROM:  A/PROM RIGHT   eval  RIGHT  05/07/2023  Shoulder extension 50   Shoulder flexion 115 - incision 168  Shoulder abduction 90 - pulls in incision 157 no pulling   Shoulder internal rotation    Shoulder external rotation 75     (Blank rows = not tested)  A/PROM LEFT   eval LEFT 05/07/2023 06/04/23 06/25/23  Shoulder extension 50     Shoulder flexion 117 - feels cording like arm pull but not visible 152 ( no pulling)  120 - feels tight upper arm / 145 post stretching  140  Shoulder abduction 85 148 130 - pull in upper arm  150  Shoulder internal rotation      Shoulder external rotation 75       (Blank rows = not tested)  UPPER EXTREMITY STRENGTH:   LYMPHEDEMA ASSESSMENTS:   LANDMARK RIGHT  eval  At axilla    15 cm proximal to olecranon process 39.7  10 cm proximal to olecranon process 38.7  Olecranon process 32.5  15 cm proximal to ulnar styloid process 27.3  10 cm proximal to ulnar styloid process 25.1  Just proximal to ulnar styloid process 18  Across hand at thumb web space 20.5  At base of 2nd digit 6.1  (Blank rows = not tested)  LANDMARK LEFT  eval 06/25/23 07/09/23  07/16/23  At axilla       15 cm proximal to olecranon process 40.5 40.6 40  36.4  10 cm proximal to olecranon process 40.5 39.3 38.5 35.2  Olecranon process 36.5 35 35 28.7  15 cm proximal to ulnar styloid process 29.2 30.2 30 28.7  10 cm proximal to ulnar styloid process 25.8 26.9 27 26.2  Just proximal to ulnar styloid process 18.8 19.5 19 18.6  Across hand at thumb web space 20.3 22 22  20.6  At base of 2nd digit 5.8 6.3 6.5 6.2  (Blank rows = not tested) 10cm  Rt: 2981 Lt: 3395 difference  QUICK DASH SURVEY: 45%   TODAY'S TREATMENT:                                                                                                                                          DATE:  07/16/23: Manual Therapy Pt  brought her new velcro garment so spent time instructing pt how to don properly. She tried this at beginning of session and then again at the end after MLD and was mostly independent with donning with very few VC's.  MLD to Lt UE: Short neck, superficial and deep abdominals, Rt axillary nodes and establishment of anterior inter-axillary anastomosis, Lt inguinal nodes and Lt axillo-inguinal anastomosis, then L UE working proximal to distal, moving fluid from upper inner arm outwards, and doing both sides of forearm moving fluid, and dorsal wrist and hand then towards pathways spending extra time in any areas of fibrosis then retracing all steps.   07/09/2023: Pt comes in wearing sleeve and glove.Hand with visible swelling and pitting edema in hand with fullness in forearm. Remeasured arm.  Performed MLD to left upper quadrant in supine and sidleying with left arm in elevation and extra attention to hand.  Short neck on left side, diaphragmatic breaths and superficial and deep abdominal MLD. Left chest and arm to wrist and hand in elevation incorportating exercise with instruction coordinated with stationary circles  Finished in sidelying for MLD to back and lateral chest. Helped pt reaplly compression sleeve and glove after treatment. Pt reports feeling much  better at end of session.  06/27/2023 Pt arrived with sleeve around elbow and a lot of extra material at wrist. No foam in glove.(Was in a rush today), dorsum of hand swollen Showed pt how to fix sleeve using rubber gloves which she said she has at home In supine: Short neck, superficial and deep abdominals and diaphragmatic breaths, bil axillary nodes and establishment of interaxillary pathway, then L UE working proximal to distal, moving fluid from upper inner arm outwards, and doing both sides of forearm moving fluid towards pathways spending extra time in any areas of fibrosis then retracing all steps.  Pt used Charm Barges to don sleeve and was assisted by PT to get sleeve high enough with rubber glove, assisted with foam pad in glove  06/25/23 Measured ROM and circumferences Due to size continuing to increase with compression circular knit we discussed POC and bandaging vs velcro.  Ordered velcro today on [facility] [facility].   Made a piece of small dot foam on soft foam backing for pt to use in glove with instruction  Also reminded pt to not sleep in this at night.   Pulleys into flexion and abduction x each for stretching and ROM.   06/19/23 Education on donning sleeve. Pt had it on but it was all bunched at the elbow.   Applied with hands and then with butler x 2.  Then PT demonstrated easy slide arm x 1 and gave handouts on both.  Performed seated self MLD education per instruction section with PT demonstrating each step and then pt performing each.  Handout given.   06/04/23 Rechecked ROM Dowel flexion x 5 AROM flexion alternating x 5  Supine c/cc circles with straight arm x 5 each In supine: Short neck, superficial and deep abdominals and diaphragmatic breaths, bil axillary nodes and establishment of interaxillary pathway, then L UE working proximal to distal, moving fluid from upper inner arm outwards, and doing both sides of forearm moving fluid towards pathways spending extra time in any  areas of fibrosis then retracing all steps.   05/27/23 In supine: Short neck, superficial and deep abdominals and diaphragmatic breaths, bil axillary nodes and establishment of interaxillary pathway, then L UE working proximal to distal, moving fluid from upper inner arm outwards, and doing  both sides of forearm moving fluid towards pathways spending extra time in any areas of fibrosis then retracing all steps. Cocoa butter used over fingers and hand.    PATIENT EDUCATION:  Education details: per today's note Person educated: Patient Education method: Chief Technology Officer Education comprehension: verbalized understanding, returned demonstration, and needs further education  HOME EXERCISE PROGRAM: Post op breast with wall flexion as an option instead of hands clasped  Self MLD Use of sleeve  ASSESSMENT:  CLINICAL IMPRESSION: Pt brought her new velcro compression garment in today so educated her in donning and doffing. Pt will do well with this with more practice. Continued with MLD to Lt UE. Her circumference measurements have reduced well since start of care. Pt will benefit from continued physical therapy at this time for final review of donning velcro garment and ROM exs of bil shoulder.  OBJECTIVE IMPAIRMENTS: decreased activity tolerance, decreased knowledge of condition, decreased knowledge of use of DME, decreased ROM, decreased strength, and pain.   ACTIVITY LIMITATIONS: carrying, lifting, and reach over head  PARTICIPATION LIMITATIONS: cleaning, laundry, and community activity  PERSONAL FACTORS: Time since onset of injury/illness/exacerbation and 3+ comorbidities: radiation hx to axilla, SLNB, disease recurrence.    are also affecting patient's functional outcome.   REHAB POTENTIAL: Good  CLINICAL DECISION MAKING: Evolving/moderate complexity  EVALUATION COMPLEXITY: Moderate  GOALS: Goals reviewed with patient? Yes  SHORT TERM GOALS: Target date: 05/07/23  Pt will  be educated on lymphedema - physiology, healthy habits, importance of treatment, and treatment options  Baseline: Goal status: MET   LONG TERM GOALS: Target date: 07/10/23  Pt will obtain compression sleeve and glove for use during treatment with education on use Baseline:  Goal status: MET  2.  Pt will improve bil shoulder reach to [facility] to allow for reach into the cabinets Baseline:  Goal status: MET  3.  Pt will be ind with self MLD for the Lt UE  Baseline:  Goal status: IN PROGRESS  4.  Pt will decrease QDASH to 15% or less to demonstrate improved mobility in the UE Baseline:  Goal status: IN PROGRESS  5.  Pt will be ind with final HEP Baseline:  Goal status: IN PROGRESS   PLAN:  PT FREQUENCY: 1-2x/week  PT DURATION: 6 weeks   PLANNED INTERVENTIONS: Therapeutic exercises, Neuromuscular re-education, Patient/Family education, Self Care, DME instructions, Manual therapy, and Re-evaluation  PLAN FOR NEXT SESSION:  How is new velcro garment going? Bil shoulder AAROM/PROM as able , MLD Lt UE, bandaging or velcro reduction - may need full CDT.   Added: 05/01/23: no coverage at [facility], faxed order to [facility] due to [employer] [place] 05/06/23: from [facility]: she would have to pay OOP but then could get reimbursed by her [employer]? Will let know if pt would like to do this 05/14/23: called [facility] and no coverage is guaranteed so she will not order there.  PT will check and send 1 more place before pt self orders.  She has ordering info.  05/16/23 - entered into [facility] [employer] portal 06/04/23; has processed  06/25/23 - entered velcro garments into [facility] [employer]    Hermenia Bers, PTA 07/16/2023, 11:10 AM  [facility] [address] [place], [place], [address] Phone: [phone]   Fax:  [phone]

## 2023-07-18 ENCOUNTER — Ambulatory Visit: Payer: Medicare Other | Admitting: Rehabilitation

## 2023-07-18 ENCOUNTER — Encounter: Payer: Self-pay | Admitting: Rehabilitation

## 2023-07-18 DIAGNOSIS — Z9013 Acquired absence of bilateral breasts and nipples: Secondary | ICD-10-CM

## 2023-07-18 DIAGNOSIS — C50412 Malignant neoplasm of upper-outer quadrant of left female breast: Secondary | ICD-10-CM | POA: Diagnosis not present

## 2023-07-18 DIAGNOSIS — Z171 Estrogen receptor negative status [ER-]: Secondary | ICD-10-CM | POA: Diagnosis not present

## 2023-07-18 DIAGNOSIS — M25611 Stiffness of right shoulder, not elsewhere classified: Secondary | ICD-10-CM | POA: Diagnosis not present

## 2023-07-18 DIAGNOSIS — I890 Lymphedema, not elsewhere classified: Secondary | ICD-10-CM | POA: Diagnosis not present

## 2023-07-18 DIAGNOSIS — M25612 Stiffness of left shoulder, not elsewhere classified: Secondary | ICD-10-CM | POA: Diagnosis not present

## 2023-07-18 NOTE — Therapy (Signed)
OUTPATIENT PHYSICAL THERAPY  UPPER EXTREMITY ONCOLOGY TREATMENT  Patient Name: Audrey Peters MRN: [id number] DOB:[birth year], 67 y.o., female Today's Date: 07/18/2023  END OF SESSION:  PT End of Session - 07/18/23 1054     Visit Number 13    Number of Visits 21    Date for PT Re-Evaluation 08/05/23    PT Start Time 1015    PT Stop Time 1055    PT Time Calculation (min) 40 min    Activity Tolerance Patient tolerated treatment well    Behavior During Therapy WFL for tasks assessed/performed                Past Medical History:  Diagnosis Date   Anemia yrs ago   Arthritis    Breast cancer (HCC)    Cancer (HCC)    recent dx in breast   Carpal tunnel syndrome of right wrist    Diabetes mellitus without complication (HCC)    dx 2008   Headache    sinus   Hypertension    Peripheral neuropathy 2024   Hands and Feet   Personal history of chemotherapy    Personal history of radiation therapy    Vaginal delivery 1983   Past Surgical History:  Procedure Laterality Date   BREAST BIOPSY     BREAST BIOPSY Left 01/01/2023   [place] LT BREAST BX W LOC DEV 1ST LESION IMG BX SPEC US GUIDE 01/01/2023 [facility]   BREAST BIOPSY Left 01/01/2023   [place] LT BREAST BX W LOC DEV EA ADD LESION IMG BX SPEC US GUIDE 01/01/2023 [facility]   BREAST LUMPECTOMY Left    BREAST LUMPECTOMY WITH RADIOACTIVE SEED AND SENTINEL LYMPH NODE BIOPSY Left 04/30/2017   Procedure: LEFT BREAST LUMPECTOMY WITH RADIOACTIVE SEED AND LEFT SENTINEL LYMPH NODE BIOPSY ERAS PATHWAY;  Surgeon: Harriette Bouillon, MD;  Location: [facility];  Service: General;  Laterality: Left;   COLONOSCOPY WITH PROPOFOL N/A 05/28/2016   Procedure: COLONOSCOPY WITH PROPOFOL;  Surgeon: Charolett Bumpers, MD;  Location: [facility];  Service: Endoscopy;  Laterality: N/A;   DILATATION & CURETTAGE/HYSTEROSCOPY WITH MYOSURE N/A 04/20/2020   Procedure: DILATATION & CURETTAGE/HYSTEROSCOPY WITH MYOSURE;  Surgeon: Myna Hidalgo, DO;  Location:  [facility];  Service: Gynecology;  Laterality: N/A;   DILATION AND CURETTAGE OF UTERUS     HYSTEROSCOPY WITH D & C N/A 07/21/2015   Procedure: DILATATION AND CURETTAGE /HYSTEROSCOPY with myosure;  Surgeon: Myna Hidalgo, DO;  Location: [facility];  Service: Gynecology;  Laterality: N/A;   MASTECTOMY W/ SENTINEL NODE BIOPSY Left 03/12/2023   Procedure: LEFT MASTECTOMY WITH SENTINEL LYMPH NODE BIOPSY;  Surgeon: Almond Lint, MD;  Location: [facility];  Service: General;  Laterality: Left;   PORTACATH PLACEMENT Right 04/30/2017   Procedure: INSERTION PORT-A-CATH;  Surgeon: Harriette Bouillon, MD;  Location: [facility];  Service: General;  Laterality: Right;   PORTACATH PLACEMENT N/A 03/12/2023   Procedure: PORT PLACEMENT WITH ULTRASOUND GUIDANCE;  Surgeon: Almond Lint, MD;  Location: [facility];  Service: General;  Laterality: N/A;   SIMPLE MASTECTOMY WITH AXILLARY SENTINEL NODE BIOPSY Right 03/12/2023   Procedure: RIGHT MASTECTOMY;  Surgeon: Almond Lint, MD;  Location: [facility];  Service: General;  Laterality: Right;   Patient Active Problem List   Diagnosis Date Noted   Recurrent breast cancer, left (HCC) 03/12/2023   Atrophy of vagina 09/12/2020   Bilateral lower extremity edema 09/12/2020   History of ductal carcinoma in situ of breast  09/12/2020   Hyperglycemia due to type 2 diabetes mellitus (HCC) 09/12/2020   Knee pain 09/12/2020   Long term (current) use of insulin (HCC) 09/12/2020   Lumbosacral spondylosis without myelopathy 09/12/2020   Mixed hyperlipidemia 09/12/2020   Obstructive sleep apnea syndrome 09/12/2020   Ovarian cyst 09/12/2020   Overweight 09/12/2020   Personal history of malignant neoplasm of breast 09/12/2020   Postmenopausal bleeding 09/12/2020   Pure hypercholesterolemia 09/12/2020   Sciatica 09/12/2020   Morbid obesity (HCC) 09/12/2020   Chemotherapy-induced peripheral neuropathy (HCC) 01/22/2018   Encounter  for antineoplastic chemotherapy 06/07/2017   Port-A-Cath in place 05/24/2017   Genetic testing 05/23/2017   Malignant neoplasm of upper-outer quadrant of left breast in female, estrogen receptor negative (HCC) 04/22/2017   Essential hypertension 01/30/2015   Diabetes mellitus (HCC) 01/30/2015    REFERRING PROVIDER: Dr. Donell Beers    REFERRING DIAG:  Diagnosis  C50.412,Z17.1 (ICD-10-CM) - Malignant neoplasm of upper-outer quadrant of left breast in female, estrogen receptor negative (HCC)    THERAPY DIAG:  Malignant neoplasm of upper-outer quadrant of left breast in female, estrogen receptor negative (HCC)  Stiffness of left shoulder, not elsewhere classified  Stiffness of right shoulder, not elsewhere classified  Status post mastectomy, bilateral  Lymphedema, not elsewhere classified  ONSET DATE: 12/2022  Rationale for Evaluation and Treatment: Rehabilitation  SUBJECTIVE:                                                                                                                                                                                           SUBJECTIVE STATEMENT:  The velcro has been okay.  I think it will work for me.     PERTINENT HISTORY: New/recurrent breast cancer on the left. 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 Port placement and bil mastectomy 03/12/23 with removal of 2 negative nodes. Treatment in 2018 with lumpectomy, SLNB, chemo and XRT.  1 negative node removed in 2018.   aspirarted frorm Rt and 150 from Lt 04/16/23. Seroma catheters may be needed. Will be doing chemo again.    PAIN:  Are you having pain? NO  The pain is worse when she lies down and moves, but is better when she sits still.   PRECAUTIONS: Lt lymphedema   RED FLAGS: None   WEIGHT BEARING RESTRICTIONS: No  FALLS:  Has patient fallen in last 6 months? No  LIVING ENVIRONMENT: Lives with: lives with their family and lives with their spouse  OCCUPATION: Retired   LEISURE:  nothing really,  get back to water aerobics.    HAND DOMINANCE: right   PRIOR LEVEL OF FUNCTION: Independent  PATIENT GOALS:  get more movement back    OBJECTIVE:  COGNITION: Overall cognitive status: Within functional limits for tasks assessed   PALPATION: sloshing full chest bilateral chest wall with return of seroma - note sent to Dr. Donell Beers   OBSERVATIONS / OTHER ASSESSMENTS: Left incision well healed - Rt incision has a small place covered with a band-aid that is still open and draining - white tissue present.   Lt back of hand and wrist appear larger with less visible landmarks  SENSATION: Numbness left tricep region.   POSTURE: rounded shoulders, forward head    UPPER EXTREMITY AROM/PROM:  A/PROM RIGHT   eval  RIGHT  05/07/2023  Shoulder extension 50   Shoulder flexion 115 - incision 168  Shoulder abduction 90 - pulls in incision 157 no pulling   Shoulder internal rotation    Shoulder external rotation 75     (Blank rows = not tested)  A/PROM LEFT   eval LEFT 05/07/2023 06/04/23 06/25/23 07/18/23  Shoulder extension 50      Shoulder flexion 117 - feels cording like arm pull but not visible 152 ( no pulling)  120 - feels tight upper arm / 145 post stretching  140 140 - pn axilla   Shoulder abduction 85 148 130 - pull in upper arm  150 150 - no pn  Shoulder internal rotation       Shoulder external rotation 75        (Blank rows = not tested)  UPPER EXTREMITY STRENGTH:   LYMPHEDEMA ASSESSMENTS:   LANDMARK RIGHT  eval  At axilla    15 cm proximal to olecranon process 39.7  10 cm proximal to olecranon process 38.7  Olecranon process 32.5  15 cm proximal to ulnar styloid process 27.3  10 cm proximal to ulnar styloid process 25.1  Just proximal to ulnar styloid process 18  Across hand at thumb web space 20.5  At base of 2nd digit 6.1  (Blank rows = not tested)  LANDMARK LEFT  eval 06/25/23 07/09/23  07/16/23  At axilla       15 cm proximal to olecranon  process 40.5 40.6 40 36.4  10 cm proximal to olecranon process 40.5 39.3 38.5 35.2  Olecranon process 36.5 35 35 28.7  15 cm proximal to ulnar styloid process 29.2 30.2 30 28.7  10 cm proximal to ulnar styloid process 25.8 26.9 27 26.2  Just proximal to ulnar styloid process 18.8 19.5 19 18.6  Across hand at thumb web space 20.3 22 22  20.6  At base of 2nd digit 5.8 6.3 6.5 6.2  (Blank rows = not tested) 10cm  Rt: 2981 Lt: 3395 difference  QUICK DASH SURVEY: 45%   TODAY'S TREATMENT:  DATE:  07/18/23: Manual Therapy Reassessed fit of garment and worked on getting the elbow crease in the right place.  We also had to look up instructions on why the hand wasn't fitting correctly.  It turns out that you were supposed to order the Rt hand to wear it black on the left hand so we discussed returing for the Rt hand vs wearing it inside out and we decided to wear it inside out for now.   Pulleys into flexion and abduction x each  Supine flexion dowel x 10 Supine chest stretch 3x30" Discussed TE and movement with compression being important for lymphatic movement  07/16/23: Manual Therapy Pt brought her new velcro garment so spent time instructing pt how to don properly. She tried this at beginning of session and then again at the end after MLD and was mostly independent with donning with very few VC's.  MLD to Lt UE: Short neck, superficial and deep abdominals, Rt axillary nodes and establishment of anterior inter-axillary anastomosis, Lt inguinal nodes and Lt axillo-inguinal anastomosis, then L UE working proximal to distal, moving fluid from upper inner arm outwards, and doing both sides of forearm moving fluid, and dorsal wrist and hand then towards pathways spending extra time in any areas of fibrosis then retracing all steps.   07/09/2023: Pt  comes in wearing sleeve and glove.Hand with visible swelling and pitting edema in hand with fullness in forearm. Remeasured arm.  Performed MLD to left upper quadrant in supine and sidleying with left arm in elevation and extra attention to hand.  Short neck on left side, diaphragmatic breaths and superficial and deep abdominal MLD. Left chest and arm to wrist and hand in elevation incorportating exercise with instruction coordinated with stationary circles  Finished in sidelying for MLD to back and lateral chest. Helped pt reaplly compression sleeve and glove after treatment. Pt reports feeling much better at end of session.   06/27/2023 Pt arrived with sleeve around elbow and a lot of extra material at wrist. No foam in glove.(Was in a rush today), dorsum of hand swollen Showed pt how to fix sleeve using rubber gloves which she said she has at home In supine: Short neck, superficial and deep abdominals and diaphragmatic breaths, bil axillary nodes and establishment of interaxillary pathway, then L UE working proximal to distal, moving fluid from upper inner arm outwards, and doing both sides of forearm moving fluid towards pathways spending extra time in any areas of fibrosis then retracing all steps.  Pt used Charm [employer]arges to don sleeve and was assisted by PT to get sleeve high enough with rubber glove, assisted with foam pad in glove  06/25/23 Measured ROM and circumferences Due to size continuing to increase with compression circular knit we discussed POC and bandaging vs velcro.  Ordered velcro today on [employer].   Made a piece of small dot foam on soft foam backing for pt to use in glove with instruction  Also reminded pt to not sleep in this at night.   Pulleys into flexion and abduction x each for stretching and ROM.   06/19/23 Education on donning sleeve. Pt had it on but it was all bunched at the elbow.   Applied with hands and then with butler x 2.  Then PT demonstrated easy slide arm x  1 and gave handouts on both.  Performed seated self MLD education per instruction section with PT demonstrating each step and then pt performing each.  Handout given.  06/04/23 Rechecked ROM Dowel flexion x 5 AROM flexion alternating x 5  Supine c/cc circles with straight arm x 5 each In supine: Short neck, superficial and deep abdominals and diaphragmatic breaths, bil axillary nodes and establishment of interaxillary pathway, then L UE working proximal to distal, moving fluid from upper inner arm outwards, and doing both sides of forearm moving fluid towards pathways spending extra time in any areas of fibrosis then retracing all steps.   05/27/23 In supine: Short neck, superficial and deep abdominals and diaphragmatic breaths, bil axillary nodes and establishment of interaxillary pathway, then L UE working proximal to distal, moving fluid from upper inner arm outwards, and doing both sides of forearm moving fluid towards pathways spending extra time in any areas of fibrosis then retracing all steps. Cocoa butter used over fingers and hand.    PATIENT EDUCATION:  Education details: per today's note Person educated: Patient Education method: Chief Technology Officer Education comprehension: verbalized understanding, returned demonstration, and needs further education  HOME EXERCISE PROGRAM: Post op breast with wall flexion as an option instead of hands clasped  Self MLD Use of sleeve  ASSESSMENT:  CLINICAL IMPRESSION: Pt is doing well today.  Continues to need draining of the Lt chest wall seroma most recently of 95ml.  Velcro wrap will work well although we ordered the wrong hand for her to wear it on the black side. She says it feels okay with the black side backwards for now.   OBJECTIVE IMPAIRMENTS: decreased activity tolerance, decreased knowledge of condition, decreased knowledge of use of DME, decreased ROM, decreased strength, and pain.   ACTIVITY LIMITATIONS: carrying,  lifting, and reach over head  PARTICIPATION LIMITATIONS: cleaning, laundry, and community activity  PERSONAL FACTORS: Time since onset of injury/illness/exacerbation and 3+ comorbidities: radiation hx to axilla, SLNB, disease recurrence.    are also affecting patient's functional outcome.   REHAB POTENTIAL: Good  CLINICAL DECISION MAKING: Evolving/moderate complexity  EVALUATION COMPLEXITY: Moderate  GOALS: Goals reviewed with patient? Yes  SHORT TERM GOALS: Target date: 05/07/23  Pt will be educated on lymphedema - physiology, healthy habits, importance of treatment, and treatment options  Baseline: Goal status: MET   LONG TERM GOALS: Target date: 07/10/23  Pt will obtain compression sleeve and glove for use during treatment with education on use Baseline:  Goal status: MET  2.  Pt will improve bil shoulder reach to [facility] to allow for reach into the cabinets Baseline:  Goal status: MET  3.  Pt will be ind with self MLD for the Lt UE  Baseline:  Goal status: IN PROGRESS  4.  Pt will decrease QDASH to 15% or less to demonstrate improved mobility in the UE Baseline:  Goal status: IN PROGRESS  5.  Pt will be ind with final HEP Baseline:  Goal status: IN PROGRESS   PLAN:  PT FREQUENCY: 1-2x/week  PT DURATION: 6 weeks   PLANNED INTERVENTIONS: Therapeutic exercises, Neuromuscular re-education, Patient/Family education, Self Care, DME instructions, Manual therapy, and Re-evaluation  PLAN FOR NEXT SESSION:  How is new velcro garment going? Bil shoulder AAROM/PROM as able , MLD Lt UE, bandaging or velcro reduction - may need full CDT.   Added: 05/01/23: no coverage at [facility], faxed order to [facility] due to [employer]  05/06/23: from [facility]: she would have to pay OOP but then could get reimbursed by her [employer]? Will let know if pt would like to do this 05/14/23: called [facility] and no coverage  is guaranteed so she will not order there.  PT will check and  send 1 more place before pt self orders.  She has ordering info.  05/16/23 - entered into [facility] portal 06/04/23; has processed  06/25/23 - entered velcro garments into [employer]    Idamae Lusher, PT 07/18/2023, 10:55 AM  [facility] [address] [place], [place], [address] Phone: [phone]   Fax:  [phone]

## 2023-07-23 ENCOUNTER — Ambulatory Visit: Payer: Medicare Other | Admitting: Rehabilitation

## 2023-07-23 ENCOUNTER — Encounter: Payer: Self-pay | Admitting: Rehabilitation

## 2023-07-23 DIAGNOSIS — M25611 Stiffness of right shoulder, not elsewhere classified: Secondary | ICD-10-CM | POA: Diagnosis not present

## 2023-07-23 DIAGNOSIS — M25612 Stiffness of left shoulder, not elsewhere classified: Secondary | ICD-10-CM | POA: Diagnosis not present

## 2023-07-23 DIAGNOSIS — Z171 Estrogen receptor negative status [ER-]: Secondary | ICD-10-CM

## 2023-07-23 DIAGNOSIS — Z9013 Acquired absence of bilateral breasts and nipples: Secondary | ICD-10-CM

## 2023-07-23 DIAGNOSIS — C50412 Malignant neoplasm of upper-outer quadrant of left female breast: Secondary | ICD-10-CM | POA: Diagnosis not present

## 2023-07-23 DIAGNOSIS — I890 Lymphedema, not elsewhere classified: Secondary | ICD-10-CM | POA: Diagnosis not present

## 2023-07-23 NOTE — Therapy (Signed)
OUTPATIENT PHYSICAL THERAPY  UPPER EXTREMITY ONCOLOGY TREATMENT  Patient Name: Audrey Peters MRN: [id number] DOB:24-Apr-1956, 67 y.o., female Today's Date: 07/23/2023  END OF SESSION:  PT End of Session - 07/23/23 1050     Visit Number 14    Number of Visits 21    Date for PT Re-Evaluation 08/05/23    PT Start Time 1008    PT Stop Time 1050    PT Time Calculation (min) 42 min    Activity Tolerance Patient tolerated treatment well    Behavior During Therapy WFL for tasks assessed/performed                 Past Medical History:  Diagnosis Date   Anemia yrs ago   Arthritis    Breast cancer (HCC)    Cancer (HCC)    recent dx in breast   Carpal tunnel syndrome of right wrist    Diabetes mellitus without complication (HCC)    dx 2008   Headache    sinus   Hypertension    Peripheral neuropathy 2024   Hands and Feet   Personal history of chemotherapy    Personal history of radiation therapy    Vaginal delivery 1983   Past Surgical History:  Procedure Laterality Date   BREAST BIOPSY     BREAST BIOPSY Left 01/01/2023   [place] LT BREAST BX W LOC DEV 1ST LESION IMG BX SPEC US GUIDE 01/01/2023 [facility]   BREAST BIOPSY Left 01/01/2023   [place] LT BREAST BX W LOC DEV EA ADD LESION IMG BX SPEC US GUIDE 01/01/2023 [facility]   BREAST LUMPECTOMY Left    BREAST LUMPECTOMY WITH RADIOACTIVE SEED AND SENTINEL LYMPH NODE BIOPSY Left 04/30/2017   Procedure: LEFT BREAST LUMPECTOMY WITH RADIOACTIVE SEED AND LEFT SENTINEL LYMPH NODE BIOPSY ERAS PATHWAY;  Surgeon: Harriette Bouillon, MD;  Location: [facility];  Service: General;  Laterality: Left;   COLONOSCOPY WITH PROPOFOL N/A 05/28/2016   Procedure: COLONOSCOPY WITH PROPOFOL;  Surgeon: Charolett Bumpers, MD;  Location: [facility];  Service: Endoscopy;  Laterality: N/A;   DILATATION & CURETTAGE/HYSTEROSCOPY WITH MYOSURE N/A 04/20/2020   Procedure: DILATATION & CURETTAGE/HYSTEROSCOPY WITH MYOSURE;  Surgeon: Myna Hidalgo, DO;   Location: [facility];  Service: Gynecology;  Laterality: N/A;   DILATION AND CURETTAGE OF UTERUS     HYSTEROSCOPY WITH D & C N/A 07/21/2015   Procedure: DILATATION AND CURETTAGE /HYSTEROSCOPY with myosure;  Surgeon: Myna Hidalgo, DO;  Location: [facility];  Service: Gynecology;  Laterality: N/A;   MASTECTOMY W/ SENTINEL NODE BIOPSY Left 03/12/2023   Procedure: LEFT MASTECTOMY WITH SENTINEL LYMPH NODE BIOPSY;  Surgeon: Almond Lint, MD;  Location: [facility];  Service: General;  Laterality: Left;   PORTACATH PLACEMENT Right 04/30/2017   Procedure: INSERTION PORT-A-CATH;  Surgeon: Harriette Bouillon, MD;  Location: [facility];  Service: General;  Laterality: Right;   PORTACATH PLACEMENT N/A 03/12/2023   Procedure: PORT PLACEMENT WITH ULTRASOUND GUIDANCE;  Surgeon: Almond Lint, MD;  Location: [facility];  Service: General;  Laterality: N/A;   SIMPLE MASTECTOMY WITH AXILLARY SENTINEL NODE BIOPSY Right 03/12/2023   Procedure: RIGHT MASTECTOMY;  Surgeon: Almond Lint, MD;  Location:  [facility];  Service: General;  Laterality: Right;   Patient Active Problem List   Diagnosis Date Noted   Recurrent breast cancer, left (HCC) 03/12/2023   Atrophy of vagina 09/12/2020   Bilateral lower extremity edema 09/12/2020   History of ductal carcinoma in situ of  breast 09/12/2020   Hyperglycemia due to type 2 diabetes mellitus (HCC) 09/12/2020   Knee pain 09/12/2020   Long term (current) use of insulin (HCC) 09/12/2020   Lumbosacral spondylosis without myelopathy 09/12/2020   Mixed hyperlipidemia 09/12/2020   Obstructive sleep apnea syndrome 09/12/2020   Ovarian cyst 09/12/2020   Overweight 09/12/2020   Personal history of malignant neoplasm of breast 09/12/2020   Postmenopausal bleeding 09/12/2020   Pure hypercholesterolemia 09/12/2020   Sciatica 09/12/2020   Morbid obesity (HCC) 09/12/2020   Chemotherapy-induced peripheral neuropathy (HCC) 01/22/2018    Encounter for antineoplastic chemotherapy 06/07/2017   Port-A-Cath in place 05/24/2017   Genetic testing 05/23/2017   Malignant neoplasm of upper-outer quadrant of left breast in female, estrogen receptor negative (HCC) 04/22/2017   Essential hypertension 01/30/2015   Diabetes mellitus (HCC) 01/30/2015    REFERRING PROVIDER: Dr. Donell Beers    REFERRING DIAG:  Diagnosis  C50.412,Z17.1 (ICD-10-CM) - Malignant neoplasm of upper-outer quadrant of left breast in female, estrogen receptor negative (HCC)    THERAPY DIAG:  Malignant neoplasm of upper-outer quadrant of left breast in female, estrogen receptor negative (HCC)  Stiffness of left shoulder, not elsewhere classified  Status post mastectomy, bilateral  Stiffness of right shoulder, not elsewhere classified  ONSET DATE: 12/2022  Rationale for Evaluation and Treatment: Rehabilitation  SUBJECTIVE:                                                                                                                                                                                           SUBJECTIVE STATEMENT:  I feel some trouble when I get my clothes off on the left arm.     PERTINENT HISTORY: New/recurrent breast cancer on the left. 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 Port placement and bil mastectomy 03/12/23 with removal of 2 negative nodes. Treatment in 2018 with lumpectomy, SLNB, chemo and XRT.  1 negative node removed in 2018.   aspirarted frorm Rt and 150 from Lt 04/16/23. Seroma catheters may be needed. Will be doing chemo again.    PAIN:  Are you having pain? NO    PRECAUTIONS: Lt lymphedema   RED FLAGS: None   WEIGHT BEARING RESTRICTIONS: No  FALLS:  Has patient fallen in last 6 months? No  LIVING ENVIRONMENT: Lives with: lives with their family and lives with their spouse  OCCUPATION: Retired   LEISURE: nothing really,  get back to water aerobics.    HAND DOMINANCE: right   PRIOR LEVEL OF FUNCTION:  Independent  PATIENT GOALS: get more movement back    OBJECTIVE:  COGNITION: Overall cognitive status: Within functional limits for tasks assessed  PALPATION: sloshing full chest bilateral chest wall with return of seroma - note sent to Dr. Donell Beers   OBSERVATIONS / OTHER ASSESSMENTS: Left incision well healed - Rt incision has a small place covered with a band-aid that is still open and draining - white tissue present.   Lt back of hand and wrist appear larger with less visible landmarks  SENSATION: Numbness left tricep region.   POSTURE: rounded shoulders, forward head    UPPER EXTREMITY AROM/PROM:  A/PROM RIGHT   eval  RIGHT  05/07/2023  Shoulder extension 50   Shoulder flexion 115 - incision 168  Shoulder abduction 90 - pulls in incision 157 no pulling   Shoulder internal rotation    Shoulder external rotation 75     (Blank rows = not tested)  A/PROM LEFT   eval LEFT 05/07/2023 06/04/23 06/25/23 07/18/23  Shoulder extension 50      Shoulder flexion 117 - feels cording like arm pull but not visible 152 ( no pulling)  120 - feels tight upper arm / 145 post stretching  140 140 - pn axilla   Shoulder abduction 85 148 130 - pull in upper arm  150 150 - no pn  Shoulder internal rotation       Shoulder external rotation 75        (Blank rows = not tested)  UPPER EXTREMITY STRENGTH:   LYMPHEDEMA ASSESSMENTS:   LANDMARK RIGHT  eval  At axilla    15 cm proximal to olecranon process 39.7  10 cm proximal to olecranon process 38.7  Olecranon process 32.5  15 cm proximal to ulnar styloid process 27.3  10 cm proximal to ulnar styloid process 25.1  Just proximal to ulnar styloid process 18  Across hand at thumb web space 20.5  At base of 2nd digit 6.1  (Blank rows = not tested)  LANDMARK LEFT  eval 06/25/23 07/09/23  07/16/23   At axilla        15 cm proximal to olecranon process 40.5 40.6 40 36.4   10 cm proximal to olecranon process 40.5 39.3 38.5 35.2   Olecranon  process 36.5 35 35 28.7   15 cm proximal to ulnar styloid process 29.2 30.2 30 28.7   10 cm proximal to ulnar styloid process 25.8 26.9 27 26.2   Just proximal to ulnar styloid process 18.8 19.5 19 18.6   Across hand at thumb web space 20.3 22 22  20.6   At base of 2nd digit 5.8 6.3 6.5 6.2   (Blank rows = not tested) 10cm  Rt: 2981 Lt: 3395 difference  QUICK DASH SURVEY: 45%   TODAY'S TREATMENT:                                                                                                                                          DATE:  07/23/23 Pt arrives wearing velcro and has  it pulled up nicely Pulleys into flexion and abduction x each  Wall ball flexion x 5 Doorway stretch Lt only 10" x 3  Supine flexion dowel x 10 Supine chest stretch 3x30" MLD to Lt UE: Short neck, Rt axillary nodes and establishment of anterior inter-axillary anastomosis, Lt inguinal nodes and Lt axillo-inguinal anastomosis, then L UE working proximal to distal, moving fluid from upper inner arm outwards, and doing both sides of forearm moving fluid, and dorsal wrist and hand then towards pathways spending extra time in any areas of fibrosis then retracing all steps. PROM into flexion  07/18/23: Manual Therapy Reassessed fit of garment and worked on getting the elbow crease in the right place.  We also had to look up instructions on why the hand wasn't fitting correctly.  It turns out that you were supposed to order the Rt hand to wear it black on the left hand so we discussed returing for the Rt hand vs wearing it inside out and we decided to wear it inside out for now.   Pulleys into flexion and abduction x each  Supine flexion dowel x 10 Supine chest stretch 3x30" Discussed TE and movement with compression being important for lymphatic movement  07/16/23: Manual Therapy Pt brought her new velcro garment so spent time instructing pt how to don properly. She tried this at beginning of  session and then again at the end after MLD and was mostly independent with donning with very few VC's.  MLD to Lt UE: Short neck, superficial and deep abdominals, Rt axillary nodes and establishment of anterior inter-axillary anastomosis, Lt inguinal nodes and Lt axillo-inguinal anastomosis, then L UE working proximal to distal, moving fluid from upper inner arm outwards, and doing both sides of forearm moving fluid, and dorsal wrist and hand then towards pathways spending extra time in any areas of fibrosis then retracing all steps.    PATIENT EDUCATION:  Education details: per today's note Person educated: Patient Education method: Chief Technology Officer Education comprehension: verbalized understanding, returned demonstration, and needs further education  HOME EXERCISE PROGRAM: Post op breast with wall flexion as an option instead of hands clasped  Self MLD Use of sleeve  ASSESSMENT:  CLINICAL IMPRESSION: Velcro is okay and pt is finding she can get it on well.  She is using the glove or the gauntlet as needed. Focused on TE and ROM today.     OBJECTIVE IMPAIRMENTS: decreased activity tolerance, decreased knowledge of condition, decreased knowledge of use of DME, decreased ROM, decreased strength, and pain.   ACTIVITY LIMITATIONS: carrying, lifting, and reach over head  PARTICIPATION LIMITATIONS: cleaning, laundry, and community activity  PERSONAL FACTORS: Time since onset of injury/illness/exacerbation and 3+ comorbidities: radiation hx to axilla, SLNB, disease recurrence.    are also affecting patient's functional outcome.   REHAB POTENTIAL: Good  CLINICAL DECISION MAKING: Evolving/moderate complexity  EVALUATION COMPLEXITY: Moderate  GOALS: Goals reviewed with patient? Yes  SHORT TERM GOALS: Target date: 05/07/23  Pt will be educated on lymphedema - physiology, healthy habits, importance of treatment, and treatment options  Baseline: Goal status: MET   LONG TERM  GOALS: Target date: 07/10/23  Pt will obtain compression sleeve and glove for use during treatment with education on use Baseline:  Goal status: MET  2.  Pt will improve bil shoulder reach to [facility] to allow for reach into the cabinets Baseline:  Goal status: MET  3.  Pt will be ind with self MLD for the Lt UE  Baseline:  Goal status: IN PROGRESS  4.  Pt will decrease QDASH to 15% or less to demonstrate improved mobility in the UE Baseline:  Goal status: IN PROGRESS  5.  Pt will be ind with final HEP Baseline:  Goal status: IN PROGRESS   PLAN:  PT FREQUENCY: 1-2x/week  PT DURATION: 6 weeks   PLANNED INTERVENTIONS: Therapeutic exercises, Neuromuscular re-education, Patient/Family education, Self Care, DME instructions, Manual therapy, and Re-evaluation  PLAN FOR NEXT SESSION:  How is new velcro garment going? Bil shoulder AAROM/PROM as able , MLD Lt UE, bandaging or velcro reduction - may need full CDT.   Added: 05/01/23: no coverage at [facility], faxed order to [facility] due to [employer] [place] 05/06/23: from [facility]: she would have to pay OOP but then could get reimbursed by her [employer]? Will let know if pt would like to do this 05/14/23: called [facility] and no coverage is guaranteed so she will not order there.  PT will check and send 1 more place before pt self orders.  She has ordering info.  05/16/23 - entered into [facility] portal 06/04/23; has processed  06/25/23 - entered velcro garments into [facility]    Idamae Lusher, PT 07/23/2023, 10:51 AM  [facility] [address] [place], [place], [address] Phone: [phone]   Fax:  [phone]

## 2023-07-25 ENCOUNTER — Ambulatory Visit: Payer: Medicare Other

## 2023-07-25 DIAGNOSIS — M25611 Stiffness of right shoulder, not elsewhere classified: Secondary | ICD-10-CM

## 2023-07-25 DIAGNOSIS — Z171 Estrogen receptor negative status [ER-]: Secondary | ICD-10-CM | POA: Diagnosis not present

## 2023-07-25 DIAGNOSIS — I890 Lymphedema, not elsewhere classified: Secondary | ICD-10-CM | POA: Diagnosis not present

## 2023-07-25 DIAGNOSIS — M25612 Stiffness of left shoulder, not elsewhere classified: Secondary | ICD-10-CM | POA: Diagnosis not present

## 2023-07-25 DIAGNOSIS — Z9013 Acquired absence of bilateral breasts and nipples: Secondary | ICD-10-CM | POA: Diagnosis not present

## 2023-07-25 DIAGNOSIS — C50412 Malignant neoplasm of upper-outer quadrant of left female breast: Secondary | ICD-10-CM | POA: Diagnosis not present

## 2023-07-25 NOTE — Therapy (Signed)
OUTPATIENT PHYSICAL THERAPY  UPPER EXTREMITY ONCOLOGY TREATMENT  Patient Name: Audrey Peters MRN: [id number] DOB:08/07/1956, 67 y.o., female Today's Date: 07/25/2023  END OF SESSION:  PT End of Session - 07/25/23 1012     Visit Number 15    Number of Visits 21    Date for PT Re-Evaluation 08/05/23    PT Start Time 1011   pt arrived late and then used the restroom upon arrival   PT Stop Time 1059    PT Time Calculation (min) 48 min    Activity Tolerance Patient tolerated treatment well    Behavior During Therapy WFL for tasks assessed/performed                 Past Medical History:  Diagnosis Date   Anemia yrs ago   Arthritis    Breast cancer (HCC)    Cancer (HCC)    recent dx in breast   Carpal tunnel syndrome of right wrist    Diabetes mellitus without complication (HCC)    dx 2008   Headache    sinus   Hypertension    Peripheral neuropathy 2024   Hands and Feet   Personal history of chemotherapy    Personal history of radiation therapy    Vaginal delivery 1983   Past Surgical History:  Procedure Laterality Date   BREAST BIOPSY     BREAST BIOPSY Left 01/01/2023   [place] LT BREAST BX W LOC DEV 1ST LESION IMG BX SPEC US GUIDE 01/01/2023 [facility]   BREAST BIOPSY Left 01/01/2023   [place] LT BREAST BX W LOC DEV EA ADD LESION IMG BX SPEC US GUIDE 01/01/2023 [facility]   BREAST LUMPECTOMY Left    BREAST LUMPECTOMY WITH RADIOACTIVE SEED AND SENTINEL LYMPH NODE BIOPSY Left 04/30/2017   Procedure: LEFT BREAST LUMPECTOMY WITH RADIOACTIVE SEED AND LEFT SENTINEL LYMPH NODE BIOPSY ERAS PATHWAY;  Surgeon: Harriette Bouillon, MD;  Location: [facility];  Service: General;  Laterality: Left;   COLONOSCOPY WITH PROPOFOL N/A 05/28/2016   Procedure: COLONOSCOPY WITH PROPOFOL;  Surgeon: Charolett Bumpers, MD;  Location: [facility];  Service: Endoscopy;  Laterality: N/A;   DILATATION & CURETTAGE/HYSTEROSCOPY WITH MYOSURE N/A 04/20/2020   Procedure: DILATATION &  CURETTAGE/HYSTEROSCOPY WITH MYOSURE;  Surgeon: Myna Hidalgo, DO;  Location: [facility];  Service: Gynecology;  Laterality: N/A;   DILATION AND CURETTAGE OF UTERUS     HYSTEROSCOPY WITH D & C N/A 07/21/2015   Procedure: DILATATION AND CURETTAGE /HYSTEROSCOPY with myosure;  Surgeon: Myna Hidalgo, DO;  Location: [facility];  Service: Gynecology;  Laterality: N/A;   MASTECTOMY W/ SENTINEL NODE BIOPSY Left 03/12/2023   Procedure: LEFT MASTECTOMY WITH SENTINEL LYMPH NODE BIOPSY;  Surgeon: Almond Lint, MD;  Location: [facility];  Service: General;  Laterality: Left;   PORTACATH PLACEMENT Right 04/30/2017   Procedure: INSERTION PORT-A-CATH;  Surgeon: Harriette Bouillon, MD;  Location: [facility];  Service: General;  Laterality: Right;   PORTACATH PLACEMENT N/A 03/12/2023   Procedure: PORT PLACEMENT WITH ULTRASOUND GUIDANCE;  Surgeon: Almond Lint, MD;  Location: [facility];  Service: General;  Laterality: N/A;   SIMPLE MASTECTOMY WITH AXILLARY SENTINEL NODE BIOPSY Right 03/12/2023   Procedure: RIGHT MASTECTOMY;  Surgeon: Almond Lint, MD;  Location: [facility];  Service: General;  Laterality: Right;   Patient Active Problem List   Diagnosis Date Noted   Recurrent breast cancer, left (HCC) 03/12/2023   Atrophy of vagina 09/12/2020   Bilateral lower extremity  edema 09/12/2020   History of ductal carcinoma in situ of breast 09/12/2020   Hyperglycemia due to type 2 diabetes mellitus (HCC) 09/12/2020   Knee pain 09/12/2020   Long term (current) use of insulin (HCC) 09/12/2020   Lumbosacral spondylosis without myelopathy 09/12/2020   Mixed hyperlipidemia 09/12/2020   Obstructive sleep apnea syndrome 09/12/2020   Ovarian cyst 09/12/2020   Overweight 09/12/2020   Personal history of malignant neoplasm of breast 09/12/2020   Postmenopausal bleeding 09/12/2020   Pure hypercholesterolemia 09/12/2020   Sciatica 09/12/2020   Morbid obesity (HCC) 09/12/2020    Chemotherapy-induced peripheral neuropathy (HCC) 01/22/2018   Encounter for antineoplastic chemotherapy 06/07/2017   Port-A-Cath in place 05/24/2017   Genetic testing 05/23/2017   Malignant neoplasm of upper-outer quadrant of left breast in female, estrogen receptor negative (HCC) 04/22/2017   Essential hypertension 01/30/2015   Diabetes mellitus (HCC) 01/30/2015    REFERRING PROVIDER: Dr. Donell Beers    REFERRING DIAG:  Diagnosis  C50.412,Z17.1 (ICD-10-CM) - Malignant neoplasm of upper-outer quadrant of left breast in female, estrogen receptor negative (HCC)    THERAPY DIAG:  Malignant neoplasm of upper-outer quadrant of left breast in female, estrogen receptor negative (HCC)  Stiffness of left shoulder, not elsewhere classified  Status post mastectomy, bilateral  Stiffness of right shoulder, not elsewhere classified  ONSET DATE: 12/2022  Rationale for Evaluation and Treatment: Rehabilitation  SUBJECTIVE:                                                                                                                                                                                           SUBJECTIVE STATEMENT:  My hand is a bit more swollen today and I dont know why. I wear either the glove or gauntlet all the time.     PERTINENT HISTORY: New/recurrent breast cancer on the left. 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 Port placement and bil mastectomy 03/12/23 with removal of 2 negative nodes. Treatment in 2018 with lumpectomy, SLNB, chemo and XRT.  1 negative node removed in 2018.   aspirarted frorm Rt and 150 from Lt 04/16/23. Seroma catheters may be needed. Will be doing chemo again.    PAIN:  Are you having pain? NO    PRECAUTIONS: Lt lymphedema   RED FLAGS: None   WEIGHT BEARING RESTRICTIONS: No  FALLS:  Has patient fallen in last 6 months? No  LIVING ENVIRONMENT: Lives with: lives with their family and lives with their spouse  OCCUPATION: Retired   LEISURE:  nothing really,  get back to water aerobics.    HAND DOMINANCE: right   PRIOR LEVEL OF FUNCTION: Independent  PATIENT GOALS:  get more movement back    OBJECTIVE:  COGNITION: Overall cognitive status: Within functional limits for tasks assessed   PALPATION: sloshing full chest bilateral chest wall with return of seroma - note sent to Dr. Donell Beers   OBSERVATIONS / OTHER ASSESSMENTS: Left incision well healed - Rt incision has a small place covered with a band-aid that is still open and draining - white tissue present.   Lt back of hand and wrist appear larger with less visible landmarks  SENSATION: Numbness left tricep region.   POSTURE: rounded shoulders, forward head    UPPER EXTREMITY AROM/PROM:  A/PROM RIGHT   eval  RIGHT  05/07/2023  Shoulder extension 50   Shoulder flexion 115 - incision 168  Shoulder abduction 90 - pulls in incision 157 no pulling   Shoulder internal rotation    Shoulder external rotation 75     (Blank rows = not tested)  A/PROM LEFT   eval LEFT 05/07/2023 06/04/23 06/25/23 07/18/23  Shoulder extension 50      Shoulder flexion 117 - feels cording like arm pull but not visible 152 ( no pulling)  120 - feels tight upper arm / 145 post stretching  140 140 - pn axilla   Shoulder abduction 85 148 130 - pull in upper arm  150 150 - no pn  Shoulder internal rotation       Shoulder external rotation 75        (Blank rows = not tested)  UPPER EXTREMITY STRENGTH:   LYMPHEDEMA ASSESSMENTS:   LANDMARK RIGHT  eval  At axilla    15 cm proximal to olecranon process 39.7  10 cm proximal to olecranon process 38.7  Olecranon process 32.5  15 cm proximal to ulnar styloid process 27.3  10 cm proximal to ulnar styloid process 25.1  Just proximal to ulnar styloid process 18  Across hand at thumb web space 20.5  At base of 2nd digit 6.1  (Blank rows = not tested)  LANDMARK LEFT  eval 06/25/23 07/09/23  07/16/23   At axilla        15 cm proximal to olecranon  process 40.5 40.6 40 36.4   10 cm proximal to olecranon process 40.5 39.3 38.5 35.2   Olecranon process 36.5 35 35 28.7   15 cm proximal to ulnar styloid process 29.2 30.2 30 28.7   10 cm proximal to ulnar styloid process 25.8 26.9 27 26.2   Just proximal to ulnar styloid process 18.8 19.5 19 18.6   Across hand at thumb web space 20.3 22 22  20.6   At base of 2nd digit 5.8 6.3 6.5 6.2   (Blank rows = not tested) 10cm  Rt: 2981 Lt: 3395 difference  QUICK DASH SURVEY: 45%   TODAY'S TREATMENT:  DATE:  07/25/23: Pt arrives with velcro garment appropriately donned. Her dorsal hand was visibly swollen despite wearing her glove. When she reapplied it after going to the bathroom it was bunched at her wrist so instructed her to make sure she pulls this up over the velcro garment so as not to trap the fluid in her hand. She thinks she normally does this but will pay more attention.  Therapeutic Exercises Pulleys into flex and abd x 2 mins each, tactile and VC's to remind pt to decrease scapular compensation, especially with abduction Roll yellow ball up wall into flex and Lt UE abd x 10 each Manual Therapy MLD to Lt UE: Short neck, Rt axillary nodes and establishment of anterior inter-axillary anastomosis, Lt inguinal nodes and Lt axillo-inguinal anastomosis, then L UE working proximal to distal, moving fluid from upper inner arm outwards, and doing both sides of forearm moving fluid, and dorsal wrist and hand then towards pathways spending extra time in any areas of fibrosis then retracing all steps.   07/23/23 Pt arrives wearing velcro and has it pulled up nicely Pulleys into flexion and abduction x each  Wall ball flexion x 5 Doorway stretch Lt only 10" x 3  Supine flexion dowel x 10 Supine chest stretch 3x30" MLD to Lt UE: Short neck, Rt  axillary nodes and establishment of anterior inter-axillary anastomosis, Lt inguinal nodes and Lt axillo-inguinal anastomosis, then L UE working proximal to distal, moving fluid from upper inner arm outwards, and doing both sides of forearm moving fluid, and dorsal wrist and hand then towards pathways spending extra time in any areas of fibrosis then retracing all steps. PROM into flexion  07/18/23: Manual Therapy Reassessed fit of garment and worked on getting the elbow crease in the right place.  We also had to look up instructions on why the hand wasn't fitting correctly.  It turns out that you were supposed to order the Rt hand to wear it black on the left hand so we discussed returing for the Rt hand vs wearing it inside out and we decided to wear it inside out for now.   Pulleys into flexion and abduction x each  Supine flexion dowel x 10 Supine chest stretch 3x30" Discussed TE and movement with compression being important for lymphatic movement  07/16/23: Manual Therapy Pt brought her new velcro garment so spent time instructing pt how to don properly. She tried this at beginning of session and then again at the end after MLD and was mostly independent with donning with very few VC's.  MLD to Lt UE: Short neck, superficial and deep abdominals, Rt axillary nodes and establishment of anterior inter-axillary anastomosis, Lt inguinal nodes and Lt axillo-inguinal anastomosis, then L UE working proximal to distal, moving fluid from upper inner arm outwards, and doing both sides of forearm moving fluid, and dorsal wrist and hand then towards pathways spending extra time in any areas of fibrosis then retracing all steps.    PATIENT EDUCATION:  Education details: per today's note Person educated: Patient Education method: Chief Technology Officer Education comprehension: verbalized understanding, returned demonstration, and needs further education  HOME EXERCISE PROGRAM: Post op breast with  wall flexion as an option instead of hands clasped  Self MLD Use of sleeve  ASSESSMENT:  CLINICAL IMPRESSION: Velcro is okay and pt is finding she can get it on well.  She is using the glove or the gauntlet as needed. Focused on TE and ROM today.  OBJECTIVE IMPAIRMENTS: decreased activity tolerance, decreased knowledge of condition, decreased knowledge of use of DME, decreased ROM, decreased strength, and pain.   ACTIVITY LIMITATIONS: carrying, lifting, and reach over head  PARTICIPATION LIMITATIONS: cleaning, laundry, and community activity  PERSONAL FACTORS: Time since onset of injury/illness/exacerbation and 3+ comorbidities: radiation hx to axilla, SLNB, disease recurrence.    are also affecting patient's functional outcome.   REHAB POTENTIAL: Good  CLINICAL DECISION MAKING: Evolving/moderate complexity  EVALUATION COMPLEXITY: Moderate  GOALS: Goals reviewed with patient? Yes  SHORT TERM GOALS: Target date: 05/07/23  Pt will be educated on lymphedema - physiology, healthy habits, importance of treatment, and treatment options  Baseline: Goal status: MET   LONG TERM GOALS: Target date: 07/10/23  Pt will obtain compression sleeve and glove for use during treatment with education on use Baseline:  Goal status: MET  2.  Pt will improve bil shoulder reach to [facility] to allow for reach into the cabinets Baseline:  Goal status: MET  3.  Pt will be ind with self MLD for the Lt UE  Baseline:  Goal status: IN PROGRESS  4.  Pt will decrease QDASH to 15% or less to demonstrate improved mobility in the UE Baseline:  Goal status: IN PROGRESS  5.  Pt will be ind with final HEP Baseline:  Goal status: IN PROGRESS   PLAN:  PT FREQUENCY: 1-2x/week  PT DURATION: 6 weeks   PLANNED INTERVENTIONS: Therapeutic exercises, Neuromuscular re-education, Patient/Family education, Self Care, DME instructions, Manual therapy, and Re-evaluation  PLAN FOR NEXT SESSION:  Bil shoulder  AAROM/PROM as able , MLD Lt UE, bandaging or velcro reduction - may need full CDT.   Added: 05/01/23: no coverage at [facility], faxed order to [facility] due to [employer] [place] 05/06/23: from [facility]: she would have to pay OOP but then could get reimbursed by her [employer]? Will let know if pt would like to do this 05/14/23: called [facility] and no coverage is guaranteed so she will not order there.  PT will check and send 1 more place before pt self orders.  She has ordering info.  05/16/23 - entered into [facility] portal 06/04/23; has processed  06/25/23 - entered velcro garments into [facility]    Hermenia Bers, PTA 07/25/2023, 11:04 AM  [facility] [address] [place], [place], [address] Phone: [phone]   Fax:  [phone]

## 2023-07-30 ENCOUNTER — Ambulatory Visit: Payer: Medicare Other

## 2023-07-30 DIAGNOSIS — M25612 Stiffness of left shoulder, not elsewhere classified: Secondary | ICD-10-CM

## 2023-07-30 DIAGNOSIS — Z9013 Acquired absence of bilateral breasts and nipples: Secondary | ICD-10-CM

## 2023-07-30 DIAGNOSIS — C50412 Malignant neoplasm of upper-outer quadrant of left female breast: Secondary | ICD-10-CM | POA: Diagnosis not present

## 2023-07-30 DIAGNOSIS — Z171 Estrogen receptor negative status [ER-]: Secondary | ICD-10-CM | POA: Diagnosis not present

## 2023-07-30 DIAGNOSIS — M25611 Stiffness of right shoulder, not elsewhere classified: Secondary | ICD-10-CM

## 2023-07-30 DIAGNOSIS — I890 Lymphedema, not elsewhere classified: Secondary | ICD-10-CM

## 2023-07-30 NOTE — Therapy (Signed)
OUTPATIENT PHYSICAL THERAPY  UPPER EXTREMITY ONCOLOGY TREATMENT  Patient Name: Audrey Peters MRN: [id number] DOB:10/17/1955, 67 y.o., female Today's Date: 07/30/2023  END OF SESSION:  PT End of Session - 07/30/23 1010     Visit Number 16    Number of Visits 21    Date for PT Re-Evaluation 08/05/23    PT Start Time 1007    PT Stop Time 1105    PT Time Calculation (min) 58 min    Activity Tolerance Patient tolerated treatment well    Behavior During Therapy WFL for tasks assessed/performed                 Past Medical History:  Diagnosis Date   Anemia yrs ago   Arthritis    Breast cancer (HCC)    Cancer (HCC)    recent dx in breast   Carpal tunnel syndrome of right wrist    Diabetes mellitus without complication (HCC)    dx 2008   Headache    sinus   Hypertension    Peripheral neuropathy 2024   Hands and Feet   Personal history of chemotherapy    Personal history of radiation therapy    Vaginal delivery 1983   Past Surgical History:  Procedure Laterality Date   BREAST BIOPSY     BREAST BIOPSY Left 01/01/2023   [place] LT BREAST BX W LOC DEV 1ST LESION IMG BX SPEC US GUIDE 01/01/2023 [facility]   BREAST BIOPSY Left 01/01/2023   [place] LT BREAST BX W LOC DEV EA ADD LESION IMG BX SPEC US GUIDE 01/01/2023 [facility]   BREAST LUMPECTOMY Left    BREAST LUMPECTOMY WITH RADIOACTIVE SEED AND SENTINEL LYMPH NODE BIOPSY Left 04/30/2017   Procedure: LEFT BREAST LUMPECTOMY WITH RADIOACTIVE SEED AND LEFT SENTINEL LYMPH NODE BIOPSY ERAS PATHWAY;  Surgeon: Harriette Bouillon, MD;  Location: [facility];  Service: General;  Laterality: Left;   COLONOSCOPY WITH PROPOFOL N/A 05/28/2016   Procedure: COLONOSCOPY WITH PROPOFOL;  Surgeon: Charolett Bumpers, MD;  Location: [facility];  Service: Endoscopy;  Laterality: N/A;   DILATATION & CURETTAGE/HYSTEROSCOPY WITH MYOSURE N/A 04/20/2020   Procedure: DILATATION & CURETTAGE/HYSTEROSCOPY WITH MYOSURE;  Surgeon: Myna Hidalgo, DO;   Location: [facility];  Service: Gynecology;  Laterality: N/A;   DILATION AND CURETTAGE OF UTERUS     HYSTEROSCOPY WITH D & C N/A 07/21/2015   Procedure: DILATATION AND CURETTAGE /HYSTEROSCOPY with myosure;  Surgeon: Myna Hidalgo, DO;  Location: [facility];  Service: Gynecology;  Laterality: N/A;   MASTECTOMY W/ SENTINEL NODE BIOPSY Left 03/12/2023   Procedure: LEFT MASTECTOMY WITH SENTINEL LYMPH NODE BIOPSY;  Surgeon: Almond Lint, MD;  Location: [facility];  Service: General;  Laterality: Left;   PORTACATH PLACEMENT Right 04/30/2017   Procedure: INSERTION PORT-A-CATH;  Surgeon: Harriette Bouillon, MD;  Location: [facility];  Service: General;  Laterality: Right;   PORTACATH PLACEMENT N/A 03/12/2023   Procedure: PORT PLACEMENT WITH ULTRASOUND GUIDANCE;  Surgeon: Almond Lint, MD;  Location: [facility];  Service: General;  Laterality: N/A;   SIMPLE MASTECTOMY WITH AXILLARY SENTINEL NODE BIOPSY Right 03/12/2023   Procedure: RIGHT MASTECTOMY;  Surgeon: Almond Lint, MD;  Location:  SUR[facility]ERY [facility];  Service: General;  Laterality: Right;   Patient Active Problem List   Diagnosis Date Noted   Recurrent breast cancer, left (HCC) 03/12/2023   Atrophy of vagina 09/12/2020   Bilateral lower extremity edema 09/12/2020   History of ductal carcinoma in situ of  breast 09/12/2020   Hyperglycemia due to type 2 diabetes mellitus (HCC) 09/12/2020   Knee pain 09/12/2020   Long term (current) use of insulin (HCC) 09/12/2020   Lumbosacral spondylosis without myelopathy 09/12/2020   Mixed hyperlipidemia 09/12/2020   Obstructive sleep apnea syndrome 09/12/2020   Ovarian cyst 09/12/2020   Overweight 09/12/2020   Personal history of malignant neoplasm of breast 09/12/2020   Postmenopausal bleeding 09/12/2020   Pure hypercholesterolemia 09/12/2020   Sciatica 09/12/2020   Morbid obesity (HCC) 09/12/2020   Chemotherapy-induced peripheral neuropathy (HCC) 01/22/2018    Encounter for antineoplastic chemotherapy 06/07/2017   Port-A-Cath in place 05/24/2017   Genetic testing 05/23/2017   Malignant neoplasm of upper-outer quadrant of left breast in female, estrogen receptor negative (HCC) 04/22/2017   Essential hypertension 01/30/2015   Diabetes mellitus (HCC) 01/30/2015    REFERRING PROVIDER: Dr. Donell Beers    REFERRING DIAG:  Diagnosis  C50.412,Z17.1 (ICD-10-CM) - Malignant neoplasm of upper-outer quadrant of left breast in female, estrogen receptor negative (HCC)    THERAPY DIAG:  Malignant neoplasm of upper-outer quadrant of left breast in female, estrogen receptor negative (HCC)  Stiffness of left shoulder, not elsewhere classified  Status post mastectomy, bilateral  Stiffness of right shoulder, not elsewhere classified  Lymphedema, not elsewhere classified  ONSET DATE: 12/2022  Rationale for Evaluation and Treatment: Rehabilitation  SUBJECTIVE:                                                                                                                                                                                           SUBJECTIVE STATEMENT:  My hand is still a little puffy but better than it was and my fingers don't feel as full. The vlecro garment is working well.    PERTINENT HISTORY: New/recurrent breast cancer on the left. 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 Port placement and bil mastectomy 03/12/23 with removal of 2 negative nodes. Treatment in 2018 with lumpectomy, SLNB, chemo and XRT.  1 negative node removed in 2018.   aspirarted frorm Rt and 150 from Lt 04/16/23. Seroma catheters may be needed. Will be doing chemo again.    PAIN:  Are you having pain? NO    PRECAUTIONS: Lt lymphedema   RED FLAGS: None   WEIGHT BEARING RESTRICTIONS: No  FALLS:  Has patient fallen in last 6 months? No  LIVING ENVIRONMENT: Lives with: lives with their family and lives with their spouse  OCCUPATION: Retired   LEISURE: nothing  really,  get back to water aerobics.    HAND DOMINANCE: right   PRIOR LEVEL OF FUNCTION: Independent  PATIENT GOALS: get more movement back  OBJECTIVE:  COGNITION: Overall cognitive status: Within functional limits for tasks assessed   PALPATION: sloshing full chest bilateral chest wall with return of seroma - note sent to Dr. Donell Beers   OBSERVATIONS / OTHER ASSESSMENTS: Left incision well healed - Rt incision has a small place covered with a band-aid that is still open and draining - white tissue present.   Lt back of hand and wrist appear larger with less visible landmarks  SENSATION: Numbness left tricep region.   POSTURE: rounded shoulders, forward head    UPPER EXTREMITY AROM/PROM:  A/PROM RIGHT   eval  RIGHT  05/07/2023  Shoulder extension 50   Shoulder flexion 115 - incision 168  Shoulder abduction 90 - pulls in incision 157 no pulling   Shoulder internal rotation    Shoulder external rotation 75     (Blank rows = not tested)  A/PROM LEFT   eval LEFT 05/07/2023 06/04/23 06/25/23 07/18/23  Shoulder extension 50      Shoulder flexion 117 - feels cording like arm pull but not visible 152 ( no pulling)  120 - feels tight upper arm / 145 post stretching  140 140 - pn axilla   Shoulder abduction 85 148 130 - pull in upper arm  150 150 - no pn  Shoulder internal rotation       Shoulder external rotation 75        (Blank rows = not tested)  UPPER EXTREMITY STRENGTH:   LYMPHEDEMA ASSESSMENTS:   LANDMARK RIGHT  eval  At axilla    15 cm proximal to olecranon process 39.7  10 cm proximal to olecranon process 38.7  Olecranon process 32.5  15 cm proximal to ulnar styloid process 27.3  10 cm proximal to ulnar styloid process 25.1  Just proximal to ulnar styloid process 18  Across hand at thumb web space 20.5  At base of 2nd digit 6.1  (Blank rows = not tested)  LANDMARK LEFT  eval 06/25/23 07/09/23  07/16/23 07/30/23  At axilla        15 cm proximal to olecranon  process 40.5 40.6 40 36.4 37.4  10 cm proximal to olecranon process 40.5 39.3 38.5 35.2 36.3  Olecranon process 36.5 35 35 28.7 28.9  15 cm proximal to ulnar styloid process 29.2 30.2 30 28.7 28  10  cm proximal to ulnar styloid process 25.8 26.9 27 26.2 25.2  Just proximal to ulnar styloid process 18.8 19.5 19 18.6 19.4  Across hand at thumb web space 20.3 22 22  20.6 21.8  At base of 2nd digit 5.8 6.3 6.5 6.2 6.1  (Blank rows = not tested) 10cm  Rt: 2981 Lt: 3395 difference  QUICK DASH SURVEY: 45%   TODAY'S TREATMENT:  DATE:  07/30/23: Therapeutic Exercises Pulleys into flex and abd x 2 mins each with VC's to remind pt to decrease Lt scapular compensation Roll yellow ball up wall into flex and Lt abd x 10 each Supine over half foam roll: Bil UE horz abd, then bil UE scaption into a "V" x 10 each, and ended with bil UE abd in a "snow angel" x 10, 5 sec holds returning therapist demo for each. Manual Therapy MLD to Lt UE: Short neck, (noted pt had a Rt SLNB in July with mastectomy so instructed her to only use lateral anastomosis) Lt inguinal nodes and Lt axillo-inguinal anastomosis, then Lt UE working proximal to distal, moving fluid from upper inner arm outwards, and doing both sides of forearm moving fluid, and dorsal wrist and hand then towards pathway spending extra time in any areas of fibrosis then retracing all steps.  07/25/23: Pt arrives with velcro garment appropriately donned. Her dorsal hand was visibly swollen despite wearing her glove. When she reapplied it after going to the bathroom it was bunched at her wrist so instructed her to make sure she pulls this up over the velcro garment so as not to trap the fluid in her hand. She thinks she normally does this but will pay more attention.  Therapeutic Exercises Pulleys into flex and  abd x 2 mins each, tactile and VC's to remind pt to decrease scapular compensation, especially with abduction Roll yellow ball up wall into flex and Lt UE abd x 10 each Manual Therapy MLD to Lt UE: Short neck, Rt axillary nodes and establishment of anterior inter-axillary anastomosis, Lt inguinal nodes and Lt axillo-inguinal anastomosis, then L UE working proximal to distal, moving fluid from upper inner arm outwards, and doing both sides of forearm moving fluid, and dorsal wrist and hand then towards pathways spending extra time in any areas of fibrosis then retracing all steps.   07/23/23 Pt arrives wearing velcro and has it pulled up nicely Pulleys into flexion and abduction x each  Wall ball flexion x 5 Doorway stretch Lt only 10" x 3  Supine flexion dowel x 10 Supine chest stretch 3x30" MLD to Lt UE: Short neck, Rt axillary nodes and establishment of anterior inter-axillary anastomosis, Lt inguinal nodes and Lt axillo-inguinal anastomosis, then L UE working proximal to distal, moving fluid from upper inner arm outwards, and doing both sides of forearm moving fluid, and dorsal wrist and hand then towards pathways spending extra time in any areas of fibrosis then retracing all steps. PROM into flexion  07/18/23: Manual Therapy Reassessed fit of garment and worked on getting the elbow crease in the right place.  We also had to look up instructions on why the hand wasn't fitting correctly.  It turns out that you were supposed to order the Rt hand to wear it black on the left hand so we discussed returing for the Rt hand vs wearing it inside out and we decided to wear it inside out for now.   Pulleys into flexion and abduction x each  Supine flexion dowel x 10 Supine chest stretch 3x30" Discussed TE and movement with compression being important for lymphatic movement  07/16/23: Manual Therapy Pt brought her new velcro garment so spent time instructing pt how to don properly. She  tried this at beginning of session and then again at the end after MLD and was mostly independent with donning with very few VC's.  MLD to Lt UE: Short neck, superficial and  deep abdominals, Rt axillary nodes and establishment of anterior inter-axillary anastomosis, Lt inguinal nodes and Lt axillo-inguinal anastomosis, then L UE working proximal to distal, moving fluid from upper inner arm outwards, and doing both sides of forearm moving fluid, and dorsal wrist and hand then towards pathways spending extra time in any areas of fibrosis then retracing all steps.    PATIENT EDUCATION:  Education details: per today's note Person educated: Patient Education method: Chief Technology Officer Education comprehension: verbalized understanding, returned demonstration, and needs further education  HOME EXERCISE PROGRAM: Post op breast with wall flexion as an option instead of hands clasped  Self MLD Use of sleeve  ASSESSMENT:  CLINICAL IMPRESSION: Pt feels she is doing well managing her lymphedema at home at this point. She feels independent with her self MLD, is doing well with donning her velcro garment, and her Lt shoulder A/ROM is steadily improving. Discussed making sure her glove doesn't roll into her wrist as this seems to be what's causing her increased dorsal hand swelling, pt verbalized understanding. She thinks she is beginning to accumulate fluid again at Lt chest seroma because her superior chest is feeling tight again, but not as "rippling" as she has said it has felt before when there's been more fluid present. She is potentially on track to D/C at next session per POC.   OBJECTIVE IMPAIRMENTS: decreased activity tolerance, decreased knowledge of condition, decreased knowledge of use of DME, decreased ROM, decreased strength, and pain.   ACTIVITY LIMITATIONS: carrying, lifting, and reach over head  PARTICIPATION LIMITATIONS: cleaning, laundry, and community activity  PERSONAL FACTORS:  Time since onset of injury/illness/exacerbation and 3+ comorbidities: radiation hx to axilla, SLNB, disease recurrence.    are also affecting patient's functional outcome.   REHAB POTENTIAL: Good  CLINICAL DECISION MAKING: Evolving/moderate complexity  EVALUATION COMPLEXITY: Moderate  GOALS: Goals reviewed with patient? Yes  SHORT TERM GOALS: Target date: 05/07/23  Pt will be educated on lymphedema - physiology, healthy habits, importance of treatment, and treatment options  Baseline: Goal status: MET   LONG TERM GOALS: Target date: 07/10/23  Pt will obtain compression sleeve and glove for use during treatment with education on use Baseline:  Goal status: MET  2.  Pt will improve bil shoulder reach to [facility] to allow for reach into the cabinets Baseline:  Goal status: MET  3.  Pt will be ind with self MLD for the Lt UE  Baseline:  Goal status: IN PROGRESS  4.  Pt will decrease QDASH to 15% or less to demonstrate improved mobility in the UE Baseline:  Goal status: IN PROGRESS  5.  Pt will be ind with final HEP Baseline:  Goal status: IN PROGRESS   PLAN:  PT FREQUENCY: 1-2x/week  PT DURATION: 6 weeks   PLANNED INTERVENTIONS: Therapeutic exercises, Neuromuscular re-education, Patient/Family education, Self Care, DME instructions, Manual therapy, and Re-evaluation  PLAN FOR NEXT SESSION:  Assess goals, is dorsal hand better with fixing glove? ready for D/C? Bil shoulder AAROM/PROM as able , MLD Lt UE, bandaging or velcro reduction - may need full CDT.   Added: 05/01/23: no coverage at [facility], faxed order to [facility] due to [employer] [place] 05/06/23: from [facility]: she would have to pay OOP but then could get reimbursed by her [employer]? Will let know if pt would like to do this 05/14/23: called [facility] and no coverage is guaranteed so she will not order there.  PT will check and send 1 more  place before pt self orders.  She has ordering info.  05/16/23 - entered  into [facility] portal 06/04/23; has processed  06/25/23 - entered velcro garments into [facility]    Hermenia Bers, PTA 07/30/2023, 11:13 AM  [facility] [address] [place], [place], [address] Phone: [phone]   Fax:  [phone]

## 2023-07-31 ENCOUNTER — Encounter: Payer: Self-pay | Admitting: *Deleted

## 2023-08-01 ENCOUNTER — Inpatient Hospital Stay: Payer: BC Managed Care – PPO

## 2023-08-01 ENCOUNTER — Inpatient Hospital Stay: Payer: BC Managed Care – PPO | Attending: Hematology and Oncology

## 2023-08-01 ENCOUNTER — Inpatient Hospital Stay (HOSPITAL_BASED_OUTPATIENT_CLINIC_OR_DEPARTMENT_OTHER): Payer: BC Managed Care – PPO | Admitting: Hematology and Oncology

## 2023-08-01 VITALS — BP 124/73 | HR 86 | Temp 97.2°F | Resp 18 | Ht 64.0 in | Wt 227.9 lb

## 2023-08-01 DIAGNOSIS — Z171 Estrogen receptor negative status [ER-]: Secondary | ICD-10-CM | POA: Diagnosis not present

## 2023-08-01 DIAGNOSIS — D6481 Anemia due to antineoplastic chemotherapy: Secondary | ICD-10-CM | POA: Insufficient documentation

## 2023-08-01 DIAGNOSIS — E119 Type 2 diabetes mellitus without complications: Secondary | ICD-10-CM

## 2023-08-01 DIAGNOSIS — C50412 Malignant neoplasm of upper-outer quadrant of left female breast: Secondary | ICD-10-CM | POA: Diagnosis not present

## 2023-08-01 DIAGNOSIS — Z5111 Encounter for antineoplastic chemotherapy: Secondary | ICD-10-CM | POA: Insufficient documentation

## 2023-08-01 DIAGNOSIS — Z1722 Progesterone receptor negative status: Secondary | ICD-10-CM | POA: Insufficient documentation

## 2023-08-01 DIAGNOSIS — Z95828 Presence of other vascular implants and grafts: Secondary | ICD-10-CM

## 2023-08-01 DIAGNOSIS — Z9011 Acquired absence of right breast and nipple: Secondary | ICD-10-CM | POA: Diagnosis not present

## 2023-08-01 DIAGNOSIS — R110 Nausea: Secondary | ICD-10-CM | POA: Insufficient documentation

## 2023-08-01 DIAGNOSIS — Z794 Long term (current) use of insulin: Secondary | ICD-10-CM

## 2023-08-01 LAB — CMP (CANCER CENTER ONLY)
ALT: 22 U/L (ref 0–44)
AST: 32 U/L (ref 15–41)
Albumin: 3.5 g/dL (ref 3.5–5.0)
Alkaline Phosphatase: 72 U/L (ref 38–126)
Anion gap: 8 (ref 5–15)
BUN: 22 mg/dL (ref 8–23)
CO2: 25 mmol/L (ref 22–32)
Calcium: 8.9 mg/dL (ref 8.9–10.3)
Chloride: 103 mmol/L (ref 98–111)
Creatinine: 0.9 mg/dL (ref 0.44–1.00)
GFR, Estimated: >60 mL/min (ref >60)
Glucose, Bld: 312 mg/dL — ABNORMAL HIGH (ref 70–99)
Potassium: 3.7 mmol/L (ref 3.5–5.1)
Sodium: 136 mmol/L (ref 135–145)
Total Bilirubin: 0.4 mg/dL (ref <1.2)
Total Protein: 6.5 g/dL (ref 6.5–8.1)

## 2023-08-01 LAB — CBC WITH DIFFERENTIAL (CANCER CENTER ONLY)
Abs Immature Granulocytes: 0.24 10*3/uL — ABNORMAL HIGH (ref 0.00–0.07)
Basophils Absolute: 0 10*3/uL (ref 0.0–0.1)
Basophils Relative: 1 %
Eosinophils Absolute: 0.1 10*3/uL (ref 0.0–0.5)
Eosinophils Relative: 1 %
HCT: 32.3 % — ABNORMAL LOW (ref 36.0–46.0)
Hemoglobin: 10.5 g/dL — ABNORMAL LOW (ref 12.0–15.0)
Immature Granulocytes: 4 %
Lymphocytes Relative: 26 %
Lymphs Abs: 1.6 10*3/uL (ref 0.7–4.0)
MCH: 28.4 pg (ref 26.0–34.0)
MCHC: 32.5 g/dL (ref 30.0–36.0)
MCV: 87.3 fL (ref 80.0–100.0)
Monocytes Absolute: 0.6 10*3/uL (ref 0.1–1.0)
Monocytes Relative: 10 %
Neutro Abs: 3.5 10*3/uL (ref 1.7–7.7)
Neutrophils Relative %: 58 %
Platelet Count: 208 10*3/uL (ref 150–400)
RBC: 3.7 MIL/uL — ABNORMAL LOW (ref 3.87–5.11)
RDW: 14.6 % (ref 11.5–15.5)
WBC Count: 6.1 10*3/uL (ref 4.0–10.5)
nRBC: 0 % (ref 0.0–0.2)

## 2023-08-01 MED ORDER — SODIUM CHLORIDE 0.9% FLUSH
10.0000 mL | INTRAVENOUS | Status: DC | PRN
  Administered 2023-08-01: 10 mL

## 2023-08-01 MED ORDER — SODIUM CHLORIDE 0.9% FLUSH
10.0000 mL | INTRAVENOUS | Status: DC | PRN
  Administered 2023-08-01: 10 mL via INTRAVENOUS

## 2023-08-01 MED ORDER — PALONOSETRON HCL INJECTION 0.25 MG/5ML
0.2500 mg | Freq: Once | INTRAVENOUS | Status: AC
  Administered 2023-08-01: 0.25 mg via INTRAVENOUS
  Filled 2023-08-01: qty 5

## 2023-08-01 MED ORDER — SODIUM CHLORIDE 0.9 % IV SOLN: Freq: Once | INTRAVENOUS | Status: AC

## 2023-08-01 MED ORDER — FLUOROURACIL CHEMO INJECTION 2.5 GM/50ML
600.0000 mg/m2 | Freq: Once | INTRAVENOUS | Status: AC
  Administered 2023-08-01: 1250 mg via INTRAVENOUS
  Filled 2023-08-01: qty 25

## 2023-08-01 MED ORDER — METHOTREXATE SODIUM CHEMO INJECTION (PF) 50 MG/2ML
40.0000 mg/m2 | Freq: Once | INTRAMUSCULAR | Status: AC
  Administered 2023-08-01: 83.5 mg via INTRAVENOUS
  Filled 2023-08-01: qty 3.34

## 2023-08-01 MED ORDER — DEXAMETHASONE SODIUM PHOSPHATE 10 MG/ML IJ SOLN
4.0000 mg | Freq: Once | INTRAMUSCULAR | Status: DC
Start: 2023-08-01 — End: 2023-08-01
  Filled 2023-08-01: qty 1

## 2023-08-01 MED ORDER — HEPARIN SOD (PORK) LOCK FLUSH 100 UNIT/ML IV SOLN
500.0000 [IU] | Freq: Once | INTRAVENOUS | Status: AC | PRN
  Administered 2023-08-01: 500 [IU]

## 2023-08-01 MED ORDER — SODIUM CHLORIDE 0.9 % IV SOLN
600.0000 mg/m2 | Freq: Once | INTRAVENOUS | Status: AC
  Administered 2023-08-01: 1260 mg via INTRAVENOUS
  Filled 2023-08-01: qty 63

## 2023-08-01 NOTE — Assessment & Plan Note (Signed)
04/30/2017: Left lumpectomy: IDC grade 3, 1.7 cm, DCIS, lymphovascular invasion present, margins negative, 0/1 lymph node negative, ER 0%, PR 0%, HER-2 negative ratio 1.37, Ki-67 40%, T1c N0 stage IB    Treatment summary: 1. adjuvant chemotherapy with dose dense Adriamycin and Cytoxan 4 followed by Taxol weekly 6 discontinued for neuropathy 2. Followed by radiation started 09/26/2017-10/25/2018  Breast cancer recurrence: mammogram 12/10/2022: Asymmetry/distortion left breast, ultrasound: 2 adjacent irregular masses 2 o'clock position left breast 1.9 cm and 1.2 cm, no suspicious lymph nodes Left breast biopsy 2:00 posterior: Grade 2 IDC with DCIS Left breast biopsy 2:00 anterior: Grade 2 IDC with DCIS ER 0%, PR 0%, HER2 0, Ki-67 30% 01/31/2023: Bone scan: Negative, CT CAP 01/26/2023: Negative for metastatic disease but nodular contour of the liver suggestive of cirrhosis 01/26/2023: Ultrasound left submandibular neck: Normal lymph node 1.2 cm 01/24/2023: MRI breast: The recently biopsied area measured 6.1 cm, skin thickening (post radiation)   Recommendation: 03/12/2023: Bilateral mastectomies: Right breast: Benign left mastectomy: Grade 3 IDC 6 cm with high-grade DCIS, margins negative, 1 intramammary lymph node negative, 0/2 sentinel lymph nodes, ER 0%, PR 0%, HER2 0, Ki-67 30% Followed by adjuvant chemotherapy with CMF x 6 cycles started 05/09/2023 ------------------------------------------------------------------------------------------------------------------------------------------------ Current treatment: Cycle 4 CMF   Chemo toxicities: Fatigue Chemotherapy-induced anemia: Mild Lymphedema left hand: Patient is working with physical therapy.      Anemia Hemoglobin decreased from 11.4 to 10.7, likely contributing to reported fatigue. -Monitor hemoglobin levels.   Hyperglycemia Last recorded blood glucose level was 239. -Await current blood glucose results. Continue current management  plan.   Return to clinic in 3 weeks for cycle 5

## 2023-08-01 NOTE — Progress Notes (Signed)
Patient Care Team: [name], MD as PCP - General (Hematology and Oncology) [name], MD as Consulting Physician (Hematology and Oncology) Lonie Peak, MD as Attending Physician (Radiation Oncology) Harriette Bouillon, MD as Consulting Physician (General Surgery) Axel Filler Larna Daughters, NP as Nurse Practitioner (Hematology and Oncology)  DIAGNOSIS:  Encounter Diagnosis  Name Primary?   Type 2 diabetes mellitus without complication, with long-term current use of insulin (HCC) Yes    SUMMARY OF ONCOLOGIC HISTORY: Oncology History  Malignant neoplasm of upper-outer quadrant of left breast in female, estrogen receptor negative (HCC)  04/05/2017 Initial Diagnosis   Left breast asymmetry by ultrasound measured 1.3 cm at 2:30 position 10 cm from nipple, no axillary lymph nodes; biopsy IDC grade 2, ER 0%, PR 0%, HER-2 negative ratio 1.37, Ki-67 40%, T1c N0 stage IB AJCC 8    04/30/2017 Surgery   Left lumpectomy: IDC grade 3, 1.7 cm, DCIS, lymphovascular invasion present, margins negative, 0/1 lymph node negative, ER 0%, PR 0%, HER-2 negative ratio 1.37, Ki-67 40%, T1c N0 stage IB   05/22/2017 Genetic Testing   Patient had genetic testing due to a personal history of triple negative breast cancer.  The Common Hereditary Cancer Panel was ordered. The Hereditary Gene Panel offered by [facility] includes sequencing and/or deletion duplication testing of the following 46 genes: APC, ATM, AXIN2, BARD1, BMPR1A, BRCA1, BRCA2, BRIP1, CDH1, CDKN2A (p14ARF), CDKN2A (p16INK4a), CHEK2, CTNNA1, DICER1, EPCAM (Deletion/duplication testing only), GREM1 (promoter region deletion/duplication testing only), KIT, MEN1, MLH1, MSH2, MSH3, MSH6, MUTYH, NBN, NF1, NHTL1, PALB2, PDGFRA, PMS2, POLD1, POLE, PTEN, RAD50, RAD51C, RAD51D, SDHB, SDHC, SDHD, SMAD4, SMARCA4. STK11, TP53, TSC1, TSC2, and VHL.  The following genes were evaluated for sequence changes only: SDHA and HOXB13 c.251G>A variant only.    Results: No  pathogenic mutations identified.  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  The date of this test report is 05/22/2017.    05/24/2017 - 08/30/2017 Chemotherapy   Dose dense Adriamycin and Cytoxan 4 followed by Taxol weekly 5 (stopped early for neuropathy)    09/26/2017 - 10/22/2017 Radiation Therapy   Adjuvant radiation therapy   12/10/2022 Relapse/Recurrence   Mammogram detected distortion, 2 irregular masses 1.9 cm and 1.2 cm: Biopsy grade 2 IDC with DCIS triple negative, scans negative, MRI breast: 6.1 cm   03/12/2023 Surgery   Bilateral mastectomies: Right breast: Benign  left mastectomy: Grade 3 IDC 6 cm with high-grade DCIS, margins negative, 1 intramammary lymph node negative, 0/2 sentinel lymph nodes, ER 0%, PR 0%, HER2 0, Ki-67 30%   05/09/2023 -  Chemotherapy   Patient is on Treatment Plan : BREAST Adjuvant CMF IV q21d       CHIEF COMPLIANT: Cycle 4 CMF  HISTORY OF PRESENT ILLNESS:   History of Present Illness   The patient, with a history of breast cancer, is currently undergoing chemotherapy with CMF and today is cycle 4. She reports fatigue, lack of interest, loss of taste, and decreased appetite following treatments. These symptoms persist for about a week post-treatment before she starts to feel better. She also experiences nausea, which is managed with medication to prevent vomiting.  The patient also reports numbness and tingling in her hands and feet, which worsens at night. She occasionally drops objects due to this. She has been attending physical therapy for this issue.  The patient also has diabetes, and her blood sugar levels have been fluctuating, with some recent improvement noted.         ALLERGIES:  is  allergic to canagliflozin, empagliflozin, other, sulfa antibiotics, and ciprofloxacin.  MEDICATIONS:  Current Outpatient Medications  Medication Sig Dispense Refill   acetaminophen (TYLENOL) 650 MG CR tablet Take 650 mg by mouth every 8  (eight) hours as needed for pain.     atorvastatin (LIPITOR) 10 MG tablet 1 tablet     clotrimazole-betamethasone (LOTRISONE) cream      Continuous Blood Gluc Sensor (FREESTYLE LIBRE 2 SENSOR) MISC .     DULoxetine (CYMBALTA) 30 MG capsule Take 30 mg by mouth daily.     gabapentin (NEURONTIN) 100 MG capsule Take 100 mg by mouth 3 (three) times daily.     glucose blood (ONETOUCH VERIO) test strip use to check blood sugar     glucose blood test strip by miscellaneous route.     Insulin Pen Needle (B-D ULTRAFINE III SHORT PEN) 31G X 8 MM MISC 3 (three) times daily.     insulin regular human CONCENTRATED (HUMULIN R U-500 KWIKPEN) 500 UNIT/ML kwikpen 150 units before breakfast, 80 units before lunch, 80 units before evening meal     lidocaine-prilocaine (EMLA) cream Apply to affected area once 30 g 3   losartan (COZAAR) 100 MG tablet Take 100 mg by mouth daily.     metFORMIN (GLUCOPHAGE-XR) 500 MG 24 hr tablet 2 tablets with evening meal     methocarbamol (ROBAXIN) 500 MG tablet Take 1 tablet (500 mg total) by mouth every 6 (six) hours as needed for muscle spasms. 20 tablet 3   ondansetron (ZOFRAN) 8 MG tablet Take 1 tablet (8 mg total) by mouth every 8 (eight) hours as needed for nausea or vomiting. Start on the third day after chemotherapy. 30 tablet 1   ONETOUCH VERIO test strip 2 (two) times daily.     oxyCODONE (OXY IR/ROXICODONE) 5 MG immediate release tablet Take 1 tablet (5 mg total) by mouth every 4 (four) hours as needed for moderate pain. 30 tablet 0   pregabalin (LYRICA) 150 MG capsule 1 capsule     prochlorperazine (COMPAZINE) 10 MG tablet Take 1 tablet (10 mg total) by mouth every 6 (six) hours as needed for nausea or vomiting. 30 tablet 1   Semaglutide,0.25 or 0.5MG /DOS, 2 MG/1.5ML SOPN Inject 0.25 mg into the skin once a week.     senna (SENOKOT) 8.6 MG TABS tablet Take 1 tablet (8.6 mg total) by mouth 2 (two) times daily. 60 tablet 0   triamterene-hydrochlorothiazide (MAXZIDE-25)  37.5-25 MG tablet Take 1 tablet by mouth every morning.     No current facility-administered medications for this visit.    PHYSICAL EXAMINATION: ECOG PERFORMANCE STATUS: 1 - Symptomatic but completely ambulatory  Vitals:   08/01/23 0941  BP: 124/73  Pulse: 86  Resp: 18  Temp: (!) 97.2 F (36.2 C)  SpO2: 100%   Filed Weights   08/01/23 0941  Weight: 227 lb 14.4 oz (103.4 kg)    Physical Exam          (exam performed in the presence of a chaperone)  LABORATORY DATA:  I have reviewed the data as listed    Latest Ref Rng & Units 07/10/2023   10:04 AM 06/20/2023    8:20 AM 05/30/2023    9:15 AM  CMP  Glucose 70 - 99 mg/dL 91  098  119   BUN 8 - 23 mg/dL 21  26  35   Creatinine 0.44 - 1.00 mg/dL 1.47  8.29  5.62   Sodium 135 - 145 mmol/L 140  138  139   Potassium 3.5 - 5.1 mmol/L 3.7  3.7  3.8   Chloride 98 - 111 mmol/L 106  104  104   CO2 22 - 32 mmol/L 29  26  25    Calcium 8.9 - 10.3 mg/dL 9.3  9.3  9.2   Total Protein 6.5 - 8.1 g/dL 7.0  6.9  7.0   Total Bilirubin <1.2 mg/dL 0.4  0.4  0.4   Alkaline Phos 38 - 126 U/L 79  60  66   AST 15 - 41 U/L 44  38  41   ALT 0 - 44 U/L 32  30  33     Lab Results  Component Value Date   WBC 6.1 08/01/2023   HGB 10.5 (L) 08/01/2023   HCT 32.3 (L) 08/01/2023   MCV 87.3 08/01/2023   PLT 208 08/01/2023   NEUTROABS 3.5 08/01/2023    ASSESSMENT & PLAN:  Malignant neoplasm of upper-outer quadrant of left breast in female, estrogen receptor negative (HCC) 04/30/2017: Left lumpectomy: IDC grade 3, 1.7 cm, DCIS, lymphovascular invasion present, margins negative, 0/1 lymph node negative, ER 0%, PR 0%, HER-2 negative ratio 1.37, Ki-67 40%, T1c N0 stage IB    Treatment summary: 1. adjuvant chemotherapy with dose dense Adriamycin and Cytoxan 4 followed by Taxol weekly 6 discontinued for neuropathy 2. Followed by radiation started 09/26/2017-10/25/2018  Breast cancer recurrence: mammogram 12/10/2022: Asymmetry/distortion left  breast, ultrasound: 2 adjacent irregular masses 2 o'clock position left breast 1.9 cm and 1.2 cm, no suspicious lymph nodes Left breast biopsy 2:00 posterior: Grade 2 IDC with DCIS Left breast biopsy 2:00 anterior: Grade 2 IDC with DCIS ER 0%, PR 0%, HER2 0, Ki-67 30% 01/31/2023: Bone scan: Negative, CT CAP 01/26/2023: Negative for metastatic disease but nodular contour of the liver suggestive of cirrhosis 01/26/2023: Ultrasound left submandibular neck: Normal lymph node 1.2 cm 01/24/2023: MRI breast: The recently biopsied area measured 6.1 cm, skin thickening (post radiation)   Recommendation: 03/12/2023: Bilateral mastectomies: Right breast: Benign left mastectomy: Grade 3 IDC 6 cm with high-grade DCIS, margins negative, 1 intramammary lymph node negative, 0/2 sentinel lymph nodes, ER 0%, PR 0%, HER2 0, Ki-67 30% Followed by adjuvant chemotherapy with CMF x 6 cycles started 05/09/2023 ------------------------------------------------------------------------------------------------------------------------------------------------ Current treatment: Cycle 4 CMF   Chemo toxicities: Fatigue: Extremely severe Chemotherapy-induced anemia: Mild To moderate nausea: Takes antiemetics which appear to be helping her but she does not feel good overall. Lymphedema left hand: Patient is working with physical therapy.      Anemia Hemoglobin decreased from 11.4 to 10.5, likely contributing to reported fatigue. -Monitor hemoglobin levels.   Hyperglycemia   -Await current blood glucose results. Continue current management plan.   Return to clinic in 3 weeks for cycle 5     Orders Placed This Encounter  Procedures   Hemoglobin A1c    Standing Status:   Future    Expected Date:   08/20/2023    Expiration Date:   07/31/2024   The patient has a good understanding of the overall plan. she agrees with it. she will call with any problems that may develop before the next visit here. Total time spent: 30 mins  including face to face time and time spent for planning, charting and co-ordination of care   Tamsen Meek, MD 08/01/23

## 2023-08-01 NOTE — Progress Notes (Signed)
Pt. Glucose 312 and pt. states she took Humulin R 170 units at home this am. Per Dr. Pamelia Hoit, hold Decadron for today.

## 2023-08-01 NOTE — Patient Instructions (Signed)
[facility] - A DEPT OF [facility]  Discharge Instructions: Thank you for choosing [facility] to provide your oncology and hematology care.   If you have a lab appointment with the Cancer Center, please go directly to the Cancer Center and check in at the registration area.   Wear comfortable clothing and clothing appropriate for easy access to any Portacath or PICC line.   We strive to give you quality time with your provider. You may need to reschedule your appointment if you arrive late (15 or more minutes).  Arriving late affects you and other patients whose appointments are after yours.  Also, if you miss three or more appointments without notifying the office, you may be dismissed from the clinic at the provider's discretion.      For prescription refill requests, have your pharmacy contact our office and allow 72 hours for refills to be completed.    Today you received the following chemotherapy and/or immunotherapy agents: Cyclophosphamide (Cytoxan), Methotrexate, and Fluorouracil.      To help prevent nausea and vomiting after your treatment, we encourage you to take your nausea medication as directed.  BELOW ARE SYMPTOMS THAT SHOULD BE REPORTED IMMEDIATELY: *FEVER GREATER THAN 100.4 F (38 C) OR HIGHER *CHILLS OR SWEATING *NAUSEA AND VOMITING THAT IS NOT CONTROLLED WITH YOUR NAUSEA MEDICATION *UNUSUAL SHORTNESS OF BREATH *UNUSUAL BRUISING OR BLEEDING *URINARY PROBLEMS (pain or burning when urinating, or frequent urination) *BOWEL PROBLEMS (unusual diarrhea, constipation, pain near the anus) TENDERNESS IN MOUTH AND THROAT WITH OR WITHOUT PRESENCE OF ULCERS (sore throat, sores in mouth, or a toothache) UNUSUAL RASH, SWELLING OR PAIN  UNUSUAL VAGINAL DISCHARGE OR ITCHING   Items with * indicate a potential emergency and should be followed up as soon as possible or go to the Emergency Department if any problems should occur.  Please  show the CHEMOTHERAPY ALERT CARD or IMMUNOTHERAPY ALERT CARD at check-in to the Emergency Department and triage nurse.  Should you have questions after your visit or need to cancel or reschedule your appointment, please contact [facility] - A DEPT OF [facility]  Dept: [phone]  and follow the prompts.  Office hours are 8:00 a.m. to 4:30 p.m. Monday - Friday. Please note that voicemails left after 4:00 p.m. may not be returned until the following business day.  We are closed weekends and major holidays. You have access to a nurse at all times for urgent questions. Please call the main number to the clinic Dept: [phone] and follow the prompts.   For any non-urgent questions, you may also contact your provider using MyChart. We now offer e-Visits for anyone 70 and older to request care online for non-urgent symptoms. For details visit [email or web address].   Also download the MyChart app! Go to the app store, search "MyChart", open the app, select [place], and log in with your MyChart username and password.

## 2023-08-05 ENCOUNTER — Ambulatory Visit: Payer: Medicare Other

## 2023-08-05 DIAGNOSIS — I890 Lymphedema, not elsewhere classified: Secondary | ICD-10-CM

## 2023-08-05 DIAGNOSIS — C50412 Malignant neoplasm of upper-outer quadrant of left female breast: Secondary | ICD-10-CM

## 2023-08-05 DIAGNOSIS — Z9013 Acquired absence of bilateral breasts and nipples: Secondary | ICD-10-CM | POA: Diagnosis not present

## 2023-08-05 DIAGNOSIS — M25612 Stiffness of left shoulder, not elsewhere classified: Secondary | ICD-10-CM

## 2023-08-05 DIAGNOSIS — Z171 Estrogen receptor negative status [ER-]: Secondary | ICD-10-CM | POA: Diagnosis not present

## 2023-08-05 DIAGNOSIS — M25611 Stiffness of right shoulder, not elsewhere classified: Secondary | ICD-10-CM | POA: Diagnosis not present

## 2023-08-05 NOTE — Therapy (Signed)
OUTPATIENT PHYSICAL THERAPY  UPPER EXTREMITY ONCOLOGY TREATMENT  Patient Name: Audrey Peters MRN: [id number] DOB:1956/01/03, 67 y.o., female Today's Date: 08/05/2023  END OF SESSION:  PT End of Session - 08/05/23 1004     Visit Number 17    Number of Visits 23    Date for PT Re-Evaluation 09/16/23    PT Start Time 1004    PT Stop Time 1055    PT Time Calculation (min) 51 min    Activity Tolerance Patient tolerated treatment well    Behavior During Therapy WFL for tasks assessed/performed                 Past Medical History:  Diagnosis Date   Anemia yrs ago   Arthritis    Breast cancer (HCC)    Cancer (HCC)    recent dx in breast   Carpal tunnel syndrome of right wrist    Diabetes mellitus without complication (HCC)    dx 2008   Headache    sinus   Hypertension    Peripheral neuropathy 2024   Hands and Feet   Personal history of chemotherapy    Personal history of radiation therapy    Vaginal delivery 1983   Past Surgical History:  Procedure Laterality Date   BREAST BIOPSY     BREAST BIOPSY Left 01/01/2023   [place] LT BREAST BX W LOC DEV 1ST LESION IMG BX SPEC US GUIDE 01/01/2023 [facility]   BREAST BIOPSY Left 01/01/2023   [place] LT BREAST BX W LOC DEV EA ADD LESION IMG BX SPEC US GUIDE 01/01/2023 [facility]   BREAST LUMPECTOMY Left    BREAST LUMPECTOMY WITH RADIOACTIVE SEED AND SENTINEL LYMPH NODE BIOPSY Left 04/30/2017   Procedure: LEFT BREAST LUMPECTOMY WITH RADIOACTIVE SEED AND LEFT SENTINEL LYMPH NODE BIOPSY ERAS PATHWAY;  Surgeon: Harriette Bouillon, MD;  Location: [facility];  Service: General;  Laterality: Left;   COLONOSCOPY WITH PROPOFOL N/A 05/28/2016   Procedure: COLONOSCOPY WITH PROPOFOL;  Surgeon: Charolett Bumpers, MD;  Location: [facility];  Service: Endoscopy;  Laterality: N/A;   DILATATION & CURETTAGE/HYSTEROSCOPY WITH MYOSURE N/A 04/20/2020   Procedure: DILATATION & CURETTAGE/HYSTEROSCOPY WITH MYOSURE;  Surgeon: Myna Hidalgo, DO;   Location: [facility];  Service: Gynecology;  Laterality: N/A;   DILATION AND CURETTAGE OF UTERUS     HYSTEROSCOPY WITH D & C N/A 07/21/2015   Procedure: DILATATION AND CURETTAGE /HYSTEROSCOPY with myosure;  Surgeon: Myna Hidalgo, DO;  Location: [facility];  Service: Gynecology;  Laterality: N/A;   MASTECTOMY W/ SENTINEL NODE BIOPSY Left 03/12/2023   Procedure: LEFT MASTECTOMY WITH SENTINEL LYMPH NODE BIOPSY;  Surgeon: Almond Lint, MD;  Location: [facility];  Service: General;  Laterality: Left;   PORTACATH PLACEMENT Right 04/30/2017   Procedure: INSERTION PORT-A-CATH;  Surgeon: Harriette Bouillon, MD;  Location: [facility];  Service: General;  Laterality: Right;   PORTACATH PLACEMENT N/A 03/12/2023   Procedure: PORT PLACEMENT WITH ULTRASOUND GUIDANCE;  Surgeon: Almond Lint, MD;  Location: [facility];  Service: General;  Laterality: N/A;   SIMPLE MASTECTOMY WITH AXILLARY SENTINEL NODE BIOPSY Right 03/12/2023   Procedure: RIGHT MASTECTOMY;  Surgeon: Almond Lint, MD;  Location: [facility];  Service: General;  Laterality: Right;   Patient Active Problem List   Diagnosis Date Noted   Recurrent breast cancer, left (HCC) 03/12/2023   Atrophy of vagina 09/12/2020   Bilateral lower extremity edema 09/12/2020   History of ductal carcinoma in situ of  breast 09/12/2020   Hyperglycemia due to type 2 diabetes mellitus (HCC) 09/12/2020   Knee pain 09/12/2020   Long term (current) use of insulin (HCC) 09/12/2020   Lumbosacral spondylosis without myelopathy 09/12/2020   Mixed hyperlipidemia 09/12/2020   Obstructive sleep apnea syndrome 09/12/2020   Ovarian cyst 09/12/2020   Overweight 09/12/2020   Personal history of malignant neoplasm of breast 09/12/2020   Postmenopausal bleeding 09/12/2020   Pure hypercholesterolemia 09/12/2020   Sciatica 09/12/2020   Morbid obesity (HCC) 09/12/2020   Chemotherapy-induced peripheral neuropathy (HCC) 01/22/2018    Encounter for antineoplastic chemotherapy 06/07/2017   Port-A-Cath in place 05/24/2017   Genetic testing 05/23/2017   Malignant neoplasm of upper-outer quadrant of left breast in female, estrogen receptor negative (HCC) 04/22/2017   Essential hypertension 01/30/2015   Diabetes mellitus (HCC) 01/30/2015    REFERRING PROVIDER: Dr. Donell Beers    REFERRING DIAG:  Diagnosis  C50.412,Z17.1 (ICD-10-CM) - Malignant neoplasm of upper-outer quadrant of left breast in female, estrogen receptor negative (HCC)    THERAPY DIAG:  Malignant neoplasm of upper-outer quadrant of left breast in female, estrogen receptor negative (HCC)  Stiffness of left shoulder, not elsewhere classified  Status post mastectomy, bilateral  Stiffness of right shoulder, not elsewhere classified  Lymphedema, not elsewhere classified  ONSET DATE: 12/2022  Rationale for Evaluation and Treatment: Rehabilitation  SUBJECTIVE:                                                                                                                                                                                           SUBJECTIVE STATEMENT:   I still would like to follow up after I go to Dr. Donell Beers on January 6 to see if she might draw fluid out of my chest. I think it might make my hand better . My velcro garment is working well. My hand is better but still swells at the top. I do my MLD everyday. I think I am doing it right. I usually do it when I am sitting.   PERTINENT HISTORY: New/recurrent breast cancer on the left. 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 Port placement and bil mastectomy 03/12/23 with removal of 2 negative nodes. Treatment in 2018 with lumpectomy, SLNB, chemo and XRT.  1 negative node removed in 2018.   aspirarted frorm Rt and 150 from Lt 04/16/23. Seroma catheters may be needed. Will be doing chemo again.    PAIN:  Are you having pain? NO    PRECAUTIONS: Lt lymphedema   RED FLAGS: None   WEIGHT BEARING  RESTRICTIONS: No  FALLS:  Has patient fallen in last 6 months? No  LIVING  ENVIRONMENT: Lives with: lives with their family and lives with their spouse  OCCUPATION: Retired   LEISURE: nothing really,  get back to water aerobics.    HAND DOMINANCE: right   PRIOR LEVEL OF FUNCTION: Independent  PATIENT GOALS: get more movement back    OBJECTIVE:  COGNITION: Overall cognitive status: Within functional limits for tasks assessed   PALPATION: sloshing full chest bilateral chest wall with return of seroma - note sent to Dr. Donell Beers   OBSERVATIONS / OTHER ASSESSMENTS: Left incision well healed - Rt incision has a small place covered with a band-aid that is still open and draining - white tissue present.   Lt back of hand and wrist appear larger with less visible landmarks  SENSATION: Numbness left tricep region.   POSTURE: rounded shoulders, forward head    UPPER EXTREMITY AROM/PROM:  A/PROM RIGHT   eval  RIGHT  05/07/2023  Shoulder extension 50   Shoulder flexion 115 - incision 168  Shoulder abduction 90 - pulls in incision 157 no pulling   Shoulder internal rotation    Shoulder external rotation 75     (Blank rows = not tested)  A/PROM LEFT   eval LEFT 05/07/2023 06/04/23 06/25/23 07/18/23  Shoulder extension 50      Shoulder flexion 117 - feels cording like arm pull but not visible 152 ( no pulling)  120 - feels tight upper arm / 145 post stretching  140 140 - pn axilla   Shoulder abduction 85 148 130 - pull in upper arm  150 150 - no pn  Shoulder internal rotation       Shoulder external rotation 75        (Blank rows = not tested)  UPPER EXTREMITY STRENGTH:   LYMPHEDEMA ASSESSMENTS:   LANDMARK RIGHT  eval  At axilla    15 cm proximal to olecranon process 39.7  10 cm proximal to olecranon process 38.7  Olecranon process 32.5  15 cm proximal to ulnar styloid process 27.3  10 cm proximal to ulnar styloid process 25.1  Just proximal to ulnar styloid process 18   Across hand at thumb web space 20.5  At base of 2nd digit 6.1  (Blank rows = not tested)  LANDMARK LEFT  eval 06/25/23 07/09/23  07/16/23 07/30/23   At axilla         15 cm proximal to olecranon process 40.5 40.6 40 36.4 37.4   10 cm proximal to olecranon process 40.5 39.3 38.5 35.2 36.3   Olecranon process 36.5 35 35 28.7 28.9   15 cm proximal to ulnar styloid process 29.2 30.2 30 28.7 28   10  cm proximal to ulnar styloid process 25.8 26.9 27 26.2 25.2   Just proximal to ulnar styloid process 18.8 19.5 19 18.6 19.4   Across hand at thumb web space 20.3 22 22  20.6 21.8   At base of 2nd digit 5.8 6.3 6.5 6.2 6.1   (Blank rows = not tested) 10cm  Rt: 2981 Lt: 3395 difference  QUICK DASH SURVEY: 45%   TODAY'S TREATMENT:  DATE:   08/05/2023 Pt has on velcro garment that she put in in a rush. Too low at wrist and hand wrap barely on. Manual Therapy MLD to Lt UE: Short neck, (noted pt had a Rt SLNB in July with mastectomy so instructed her to only use lateral anastomosis) Lt inguinal nodes and Lt axillo-inguinal anastomosis, then Lt UE working proximal to distal, moving fluid from upper inner arm outwards, and doing both sides of forearm moving fluid, and dorsal wrist and hand then towards pathway spending extra time in any areas of fibrosis then retracing all steps. Performed in sitting with pt sitting in chair and had pt practice  all steps. She required moderate VC's and TC's initially, but improved nicely with practice. Gave written handout with each step listed. Assisted pt with velcro wrap and proper way to don; Only wearing hand wrap without anything on fingers today and advised her fingers will swell without the glove  07/30/23: Therapeutic Exercises Pulleys into flex and abd x 2 mins each with VC's to remind pt to decrease Lt scapular  compensation Roll yellow ball up wall into flex and Lt abd x 10 each Supine over half foam roll: Bil UE horz abd, then bil UE scaption into a "V" x 10 each, and ended with bil UE abd in a "snow angel" x 10, 5 sec holds returning therapist demo for each. Manual Therapy MLD to Lt UE: Short neck, (noted pt had a Rt SLNB in July with mastectomy so instructed her to only use lateral anastomosis) Lt inguinal nodes and Lt axillo-inguinal anastomosis, then Lt UE working proximal to distal, moving fluid from upper inner arm outwards, and doing both sides of forearm moving fluid, and dorsal wrist and hand then towards pathway spending extra time in any areas of fibrosis then retracing all steps.  07/25/23: Pt arrives with velcro garment appropriately donned. Her dorsal hand was visibly swollen despite wearing her glove. When she reapplied it after going to the bathroom it was bunched at her wrist so instructed her to make sure she pulls this up over the velcro garment so as not to trap the fluid in her hand. She thinks she normally does this but will pay more attention.  Therapeutic Exercises Pulleys into flex and abd x 2 mins each, tactile and VC's to remind pt to decrease scapular compensation, especially with abduction Roll yellow ball up wall into flex and Lt UE abd x 10 each Manual Therapy MLD to Lt UE: Short neck, Rt axillary nodes and establishment of anterior inter-axillary anastomosis, Lt inguinal nodes and Lt axillo-inguinal anastomosis, then L UE working proximal to distal, moving fluid from upper inner arm outwards, and doing both sides of forearm moving fluid, and dorsal wrist and hand then towards pathways spending extra time in any areas of fibrosis then retracing all steps.   07/23/23 Pt arrives wearing velcro and has it pulled up nicely Pulleys into flexion and abduction x each  Wall ball flexion x 5 Doorway stretch Lt only 10" x 3  Supine flexion dowel x 10 Supine chest stretch  3x30" MLD to Lt UE: Short neck, Rt axillary nodes and establishment of anterior inter-axillary anastomosis, Lt inguinal nodes and Lt axillo-inguinal anastomosis, then L UE working proximal to distal, moving fluid from upper inner arm outwards, and doing both sides of forearm moving fluid, and dorsal wrist and hand then towards pathways spending extra time in any areas of fibrosis then retracing all steps. PROM into flexion  07/18/23: Manual Therapy Reassessed fit of garment and worked on getting the elbow crease in the right place.  We also had to look up instructions on why the hand wasn't fitting correctly.  It turns out that you were supposed to order the Rt hand to wear it black on the left hand so we discussed returing for the Rt hand vs wearing it inside out and we decided to wear it inside out for now.   Pulleys into flexion and abduction x each  Supine flexion dowel x 10 Supine chest stretch 3x30" Discussed TE and movement with compression being important for lymphatic movement  07/16/23: Manual Therapy Pt brought her new velcro garment so spent time instructing pt how to don properly. She tried this at beginning of session and then again at the end after MLD and was mostly independent with donning with very few VC's.  MLD to Lt UE: Short neck, superficial and deep abdominals, Rt axillary nodes and establishment of anterior inter-axillary anastomosis, Lt inguinal nodes and Lt axillo-inguinal anastomosis, then L UE working proximal to distal, moving fluid from upper inner arm outwards, and doing both sides of forearm moving fluid, and dorsal wrist and hand then towards pathways spending extra time in any areas of fibrosis then retracing all steps.    PATIENT EDUCATION:  Education details: per today's note Person educated: Patient Education method: Chief Technology Officer Education comprehension: verbalized understanding, returned demonstration, and needs further education  HOME  EXERCISE PROGRAM: Post op breast with wall flexion as an option instead of hands clasped  Self MLD Use of sleeve  ASSESSMENT:  CLINICAL IMPRESSION: Pt feels she is doing well managing her lymphedema at home at this point, however she is scheduled to see Dr. Donell Beers on January 6 and would like to return to see [place] after that. She feels is doing well with donning her velcro garment, and her Lt shoulder A/ROM is steadily improving. . She thinks she is beginning to accumulate fluid again at Lt chest seroma because her superior chest is feeling tight again, but not as "rippling" as she has said it has felt before when there's been more fluid present. She is scheduled to see Dr. Donell Beers on January 6 and then has chemo so would like to come for follow up visit on the Jan14th. We did review self MLD today and pt required mod to max VC's initially for sequence and technique. She did improve with practice but would benefit from review. We also reviewed proper way to don her velcro garment and she does have some mild difficulty getting this on properly. She will benefit from 1 to several visits to be sure she is fully independent with MLD and donning her garment.  OBJECTIVE IMPAIRMENTS: decreased activity tolerance, decreased knowledge of condition, decreased knowledge of use of DME, decreased ROM, decreased strength, and pain.   ACTIVITY LIMITATIONS: carrying, lifting, and reach over head  PARTICIPATION LIMITATIONS: cleaning, laundry, and community activity  PERSONAL FACTORS: Time since onset of injury/illness/exacerbation and 3+ comorbidities: radiation hx to axilla, SLNB, disease recurrence.    are also affecting patient's functional outcome.   REHAB POTENTIAL: Good  CLINICAL DECISION MAKING: Evolving/moderate complexity  EVALUATION COMPLEXITY: Moderate  GOALS: Goals reviewed with patient? Yes  SHORT TERM GOALS: Target date: 05/07/23  Pt will be educated on lymphedema - physiology, healthy habits,  importance of treatment, and treatment options  Baseline: Goal status: MET   LONG TERM GOALS: Target date: 07/10/23  Pt will obtain compression  sleeve and glove for use during treatment with education on use Baseline:  Goal status: MET  2.  Pt will improve bil shoulder reach to [facility] to allow for reach into the cabinets Baseline:  Goal status: MET  3.  Pt will be ind with self MLD for the Lt UE  Baseline:  Goal status: IN PROGRESS  4.  Pt will decrease QDASH to 15% or less to demonstrate improved mobility in the UE Baseline:  (38.64 08/05/2023) Goal status: IN PROGRESS  5.  Pt will be ind with final HEP Baseline:  Goal status: IN PROGRESS   PLAN:  PT FREQUENCY:  1x-2x/week, or 6 visits  PT DURATION: 6 weeks   PLANNED INTERVENTIONS: Therapeutic exercises, Neuromuscular re-education, Patient/Family education, Self Care, DME instructions, Manual therapy, and Re-evaluation  PLAN FOR NEXT SESSION:  re-Assess goals, is dorsal hand better with fixing glove? ready for D/C? Bil shoulder AAROM/PROM as able , MLD Lt UE, bandaging or velcro reduction - may need full CDT. (Pt wanted to return after seeing Dr. Priscella Mann review of MLD, garments  Added: 05/01/23: no coverage at [facility], faxed order to [facility] due to [facility]  05/06/23: from [facility]: she would have to pay OOP but then could get reimbursed by her [employer]? Will let know if pt would like to do this 05/14/23: called [facility] and no coverage is guaranteed so she will not order there.  PT will check and send 1 more place before pt self orders.  She has ordering info.  05/16/23 - entered into [facility] portal 06/04/23; has processed  06/25/23 - entered velcro garments into [facility]    Waynette Buttery, PT 08/05/2023, 12:10 PM  [facility] [address] [place], [place], [address] Phone: [phone]   Fax:  [phone]

## 2023-08-12 ENCOUNTER — Encounter: Payer: Self-pay | Admitting: Hematology and Oncology

## 2023-08-19 DIAGNOSIS — C50412 Malignant neoplasm of upper-outer quadrant of left female breast: Secondary | ICD-10-CM | POA: Diagnosis not present

## 2023-08-19 DIAGNOSIS — Z171 Estrogen receptor negative status [ER-]: Secondary | ICD-10-CM | POA: Diagnosis not present

## 2023-08-19 DIAGNOSIS — N6489 Other specified disorders of breast: Secondary | ICD-10-CM | POA: Diagnosis not present

## 2023-08-20 ENCOUNTER — Inpatient Hospital Stay: Payer: Medicare Other

## 2023-08-20 ENCOUNTER — Inpatient Hospital Stay: Payer: Medicare Other | Admitting: Hematology and Oncology

## 2023-08-20 ENCOUNTER — Inpatient Hospital Stay: Payer: Medicare Other | Attending: Hematology and Oncology

## 2023-08-20 VITALS — BP 125/67 | HR 86 | Temp 97.8°F | Resp 18 | Ht 64.0 in | Wt 229.3 lb

## 2023-08-20 DIAGNOSIS — Z5111 Encounter for antineoplastic chemotherapy: Secondary | ICD-10-CM | POA: Insufficient documentation

## 2023-08-20 DIAGNOSIS — D6481 Anemia due to antineoplastic chemotherapy: Secondary | ICD-10-CM | POA: Diagnosis not present

## 2023-08-20 DIAGNOSIS — Z79631 Long term (current) use of antimetabolite agent: Secondary | ICD-10-CM | POA: Diagnosis not present

## 2023-08-20 DIAGNOSIS — C50412 Malignant neoplasm of upper-outer quadrant of left female breast: Secondary | ICD-10-CM | POA: Diagnosis not present

## 2023-08-20 DIAGNOSIS — R739 Hyperglycemia, unspecified: Secondary | ICD-10-CM | POA: Diagnosis not present

## 2023-08-20 DIAGNOSIS — Z171 Estrogen receptor negative status [ER-]: Secondary | ICD-10-CM

## 2023-08-20 DIAGNOSIS — Z95828 Presence of other vascular implants and grafts: Secondary | ICD-10-CM

## 2023-08-20 DIAGNOSIS — E119 Type 2 diabetes mellitus without complications: Secondary | ICD-10-CM

## 2023-08-20 LAB — CBC WITH DIFFERENTIAL (CANCER CENTER ONLY)
Abs Immature Granulocytes: 0.06 10*3/uL (ref 0.00–0.07)
Basophils Absolute: 0 10*3/uL (ref 0.0–0.1)
Basophils Relative: 0 %
Eosinophils Absolute: 0.1 10*3/uL (ref 0.0–0.5)
Eosinophils Relative: 3 %
HCT: 33.4 % — ABNORMAL LOW (ref 36.0–46.0)
Hemoglobin: 10.4 g/dL — ABNORMAL LOW (ref 12.0–15.0)
Immature Granulocytes: 2 %
Lymphocytes Relative: 33 %
Lymphs Abs: 1 10*3/uL (ref 0.7–4.0)
MCH: 28 pg (ref 26.0–34.0)
MCHC: 31.1 g/dL (ref 30.0–36.0)
MCV: 89.8 fL (ref 80.0–100.0)
Monocytes Absolute: 0.7 10*3/uL (ref 0.1–1.0)
Monocytes Relative: 23 %
Neutro Abs: 1.2 10*3/uL — ABNORMAL LOW (ref 1.7–7.7)
Neutrophils Relative %: 39 %
Platelet Count: 177 10*3/uL (ref 150–400)
RBC: 3.72 MIL/uL — ABNORMAL LOW (ref 3.87–5.11)
RDW: 15.5 % (ref 11.5–15.5)
WBC Count: 3.1 10*3/uL — ABNORMAL LOW (ref 4.0–10.5)
nRBC: 0 % (ref 0.0–0.2)

## 2023-08-20 LAB — CMP (CANCER CENTER ONLY)
ALT: 29 U/L (ref 0–44)
AST: 49 U/L — ABNORMAL HIGH (ref 15–41)
Albumin: 3.6 g/dL (ref 3.5–5.0)
Alkaline Phosphatase: 64 U/L (ref 38–126)
Anion gap: 7 (ref 5–15)
BUN: 24 mg/dL — ABNORMAL HIGH (ref 8–23)
CO2: 26 mmol/L (ref 22–32)
Calcium: 8.9 mg/dL (ref 8.9–10.3)
Chloride: 105 mmol/L (ref 98–111)
Creatinine: 0.96 mg/dL (ref 0.44–1.00)
GFR, Estimated: >60 mL/min (ref >60)
Glucose, Bld: 367 mg/dL — ABNORMAL HIGH (ref 70–99)
Potassium: 3.7 mmol/L (ref 3.5–5.1)
Sodium: 138 mmol/L (ref 135–145)
Total Bilirubin: 0.5 mg/dL (ref 0.0–1.2)
Total Protein: 6.8 g/dL (ref 6.5–8.1)

## 2023-08-20 MED ORDER — SODIUM CHLORIDE 0.9 % IV SOLN
500.0000 mg/m2 | Freq: Once | INTRAVENOUS | Status: AC
  Administered 2023-08-20: 1000 mg via INTRAVENOUS
  Filled 2023-08-20: qty 50

## 2023-08-20 MED ORDER — SODIUM CHLORIDE 0.9 % IV SOLN: Freq: Once | INTRAVENOUS | Status: AC

## 2023-08-20 MED ORDER — PALONOSETRON HCL INJECTION 0.25 MG/5ML
0.2500 mg | Freq: Once | INTRAVENOUS | Status: AC
  Administered 2023-08-20: 0.25 mg via INTRAVENOUS
  Filled 2023-08-20: qty 5

## 2023-08-20 MED ORDER — SODIUM CHLORIDE 0.9% FLUSH
10.0000 mL | INTRAVENOUS | Status: DC | PRN
Start: 2023-08-20 — End: 2023-08-20
  Administered 2023-08-20: 10 mL via INTRAVENOUS

## 2023-08-20 MED ORDER — SODIUM CHLORIDE 0.9 % IV SOLN: 4.0000 mg | Freq: Once | INTRAVENOUS | Status: DC

## 2023-08-20 MED ORDER — SODIUM CHLORIDE 0.9% FLUSH
10.0000 mL | INTRAVENOUS | Status: DC | PRN
Start: 2023-08-20 — End: 2023-08-20
  Administered 2023-08-20: 10 mL

## 2023-08-20 MED ORDER — INSULIN ASPART 100 UNIT/ML IJ SOLN
4.0000 [IU] | Freq: Once | INTRAMUSCULAR | Status: AC
Start: 2023-08-20 — End: 2023-08-20
  Administered 2023-08-20: 4 [IU] via SUBCUTANEOUS
  Filled 2023-08-20: qty 1

## 2023-08-20 MED ORDER — METHOTREXATE SODIUM CHEMO INJECTION (PF) 50 MG/2ML
30.0000 mg/m2 | Freq: Once | INTRAMUSCULAR | Status: AC
Start: 2023-08-20 — End: 2023-08-20
  Administered 2023-08-20: 62.75 mg via INTRAVENOUS
  Filled 2023-08-20: qty 2.51

## 2023-08-20 MED ORDER — DEXAMETHASONE SODIUM PHOSPHATE 10 MG/ML IJ SOLN
4.0000 mg | Freq: Once | INTRAMUSCULAR | Status: AC
  Administered 2023-08-20: 4 mg via INTRAVENOUS
  Filled 2023-08-20: qty 1

## 2023-08-20 MED ORDER — HEPARIN SOD (PORK) LOCK FLUSH 100 UNIT/ML IV SOLN
500.0000 [IU] | Freq: Once | INTRAVENOUS | Status: AC | PRN
  Administered 2023-08-20: 500 [IU]

## 2023-08-20 MED ORDER — FLUOROURACIL CHEMO INJECTION 2.5 GM/50ML
400.0000 mg/m2 | Freq: Once | INTRAVENOUS | Status: AC
  Administered 2023-08-20: 850 mg via INTRAVENOUS
  Filled 2023-08-20: qty 17

## 2023-08-20 NOTE — Progress Notes (Signed)
 Per Pamelia Hoit, MD, okay to proceed with treatment with ANC 1.2.

## 2023-08-20 NOTE — Progress Notes (Signed)
 Patient Care Team: Odean Potts, MD as PCP - General (Hematology and Oncology) Odean Potts, MD as Consulting Physician (Hematology and Oncology) Izell Domino, MD as Attending Physician (Radiation Oncology) Vanderbilt Ned, MD as Consulting Physician (General Surgery) Crawford Morna Pickle, NP as Nurse Practitioner (Hematology and Oncology)  DIAGNOSIS:  Encounter Diagnosis  Name Primary?   Malignant neoplasm of upper-outer quadrant of left breast in female, estrogen receptor negative (HCC) Yes    SUMMARY OF ONCOLOGIC HISTORY: Oncology History  Malignant neoplasm of upper-outer quadrant of left breast in female, estrogen receptor negative (HCC)  04/05/2017 Initial Diagnosis   Left breast asymmetry by ultrasound measured 1.3 cm at 2:30 position 10 cm from nipple, no axillary lymph nodes; biopsy IDC grade 2, ER 0%, PR 0%, HER-2 negative ratio 1.37, Ki-67 40%, T1c N0 stage IB AJCC 8    04/30/2017 Surgery   Left lumpectomy: IDC grade 3, 1.7 cm, DCIS, lymphovascular invasion present, margins negative, 0/1 lymph node negative, ER 0%, PR 0%, HER-2 negative ratio 1.37, Ki-67 40%, T1c N0 stage IB   05/22/2017 Genetic Testing   Patient had genetic testing due to a personal history of triple negative breast cancer.  The Common Hereditary Cancer Panel was ordered. The Hereditary Gene Panel offered by [facility] includes sequencing and/or deletion duplication testing of the following 46 genes: APC, ATM, AXIN2, BARD1, BMPR1A, BRCA1, BRCA2, BRIP1, CDH1, CDKN2A (p14ARF), CDKN2A (p16INK4a), CHEK2, CTNNA1, DICER1, EPCAM (Deletion/duplication testing only), GREM1 (promoter region deletion/duplication testing only), KIT, MEN1, MLH1, MSH2, MSH3, MSH6, MUTYH, NBN, NF1, NHTL1, PALB2, PDGFRA, PMS2, POLD1, POLE, PTEN, RAD50, RAD51C, RAD51D, SDHB, SDHC, SDHD, SMAD4, SMARCA4. STK11, TP53, TSC1, TSC2, and VHL.  The following genes were evaluated for sequence changes only: SDHA and HOXB13 c.251G>A variant only.     Results: No pathogenic mutations identified.  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  The date of this test report is 05/22/2017.    05/24/2017 - 08/30/2017 Chemotherapy   Dose dense Adriamycin  and Cytoxan  4 followed by Taxol  weekly 5 (stopped early for neuropathy)    09/26/2017 - 10/22/2017 Radiation Therapy   Adjuvant radiation therapy   12/10/2022 Relapse/Recurrence   Mammogram detected distortion, 2 irregular masses 1.9 cm and 1.2 cm: Biopsy grade 2 IDC with DCIS triple negative, scans negative, MRI breast: 6.1 cm   03/12/2023 Surgery   Bilateral mastectomies: Right breast: Benign  left mastectomy: Grade 3 IDC 6 cm with high-grade DCIS, margins negative, 1 intramammary lymph node negative, 0/2 sentinel lymph nodes, ER 0%, PR 0%, HER2 0, Ki-67 30%   05/09/2023 -  Chemotherapy   Patient is on Treatment Plan : BREAST Adjuvant CMF IV q21d       CHIEF COMPLIANT: Cycle 5 CMF  HISTORY OF PRESENT ILLNESS: History of Present Illness   The patient, with a history of cancer, presents with fatigue and weakness. She reports that she has been feeling drained and lacks energy, especially after her treatments. She notes that it takes her a few days to recover from the treatments, and she only has a few days of energy before the next treatment. She also reports a little bit of diarrhea. She is currently undergoing chemotherapy and has one more treatment left after today.  In addition to her cancer treatment, the patient also has a port which has been causing her to trigger metal detectors. She requests a note from the doctor to explain this.         ALLERGIES:  is allergic to canagliflozin, empagliflozin, other,  sulfa antibiotics, and ciprofloxacin.  MEDICATIONS:  Current Outpatient Medications  Medication Sig Dispense Refill   acetaminophen  (TYLENOL ) 650 MG CR tablet Take 650 mg by mouth every 8 (eight) hours as needed for pain.     atorvastatin  (LIPITOR) 10 MG tablet 1  tablet     clotrimazole -betamethasone (LOTRISONE) cream      Continuous Blood Gluc Sensor (FREESTYLE LIBRE 2 SENSOR) MISC .     DULoxetine  (CYMBALTA ) 30 MG capsule Take 30 mg by mouth daily.     gabapentin  (NEURONTIN ) 100 MG capsule Take 100 mg by mouth 3 (three) times daily.     glucose blood (ONETOUCH VERIO) test strip use to check blood sugar     glucose blood test strip by miscellaneous route.     Insulin  Pen Needle (B-D ULTRAFINE III SHORT PEN) 31G X 8 MM MISC 3 (three) times daily.     insulin  regular human CONCENTRATED (HUMULIN R  U-500 KWIKPEN) 500 UNIT/ML kwikpen 150 units before breakfast, 80 units before lunch, 80 units before evening meal     lidocaine -prilocaine  (EMLA ) cream Apply to affected area once 30 g 3   losartan  (COZAAR ) 100 MG tablet Take 100 mg by mouth daily.     metFORMIN  (GLUCOPHAGE -XR) 500 MG 24 hr tablet 2 tablets with evening meal     methocarbamol  (ROBAXIN ) 500 MG tablet Take 1 tablet (500 mg total) by mouth every 6 (six) hours as needed for muscle spasms. 20 tablet 3   ondansetron  (ZOFRAN ) 8 MG tablet Take 1 tablet (8 mg total) by mouth every 8 (eight) hours as needed for nausea or vomiting. Start on the third day after chemotherapy. 30 tablet 1   ONETOUCH VERIO test strip 2 (two) times daily.     oxyCODONE  (OXY IR/ROXICODONE ) 5 MG immediate release tablet Take 1 tablet (5 mg total) by mouth every 4 (four) hours as needed for moderate pain. 30 tablet 0   pregabalin  (LYRICA ) 150 MG capsule 1 capsule     prochlorperazine  (COMPAZINE ) 10 MG tablet Take 1 tablet (10 mg total) by mouth every 6 (six) hours as needed for nausea or vomiting. 30 tablet 1   Semaglutide,0.25 or 0.5MG /DOS, 2 MG/1.5ML SOPN Inject 0.25 mg into the skin once a week.     senna (SENOKOT) 8.6 MG TABS tablet Take 1 tablet (8.6 mg total) by mouth 2 (two) times daily. 60 tablet 0   triamterene -hydrochlorothiazide  (MAXZIDE -25) 37.5-25 MG tablet Take 1 tablet by mouth every morning.     No current  facility-administered medications for this visit.   Facility-Administered Medications Ordered in Other Visits  Medication Dose Route Frequency Provider Last Rate Last Admin   cyclophosphamide  (CYTOXAN ) 1,000 mg in sodium chloride  0.9 % 250 mL chemo infusion  500 mg/m2 (Treatment Plan Recorded) Intravenous Once Sloane Junkin, MD       dexamethasone  (DECADRON ) injection 4 mg  4 mg Intravenous Once Karielle Davidow, MD       fluorouracil  (ADRUCIL ) chemo injection 850 mg  400 mg/m2 (Treatment Plan Recorded) Intravenous Once Namish Krise, MD       methotrexate  (PF) chemo injection 62.75 mg  30 mg/m2 (Treatment Plan Recorded) Intravenous Once Benedetto Ryder, MD       palonosetron  (ALOXI ) injection 0.25 mg  0.25 mg Intravenous Once Tajay Muzzy, MD        PHYSICAL EXAMINATION: ECOG PERFORMANCE STATUS: 1 - Symptomatic but completely ambulatory  Vitals:   08/20/23 1203  BP: 125/67  Pulse: 86  Resp: 18  Temp: 97.8 F (  36.6 C)  SpO2: 94%   Filed Weights   08/20/23 1203  Weight: 229 lb 4.8 oz (104 kg)      LABORATORY DATA:  I have reviewed the data as listed    Latest Ref Rng & Units 08/20/2023   11:28 AM 08/01/2023    9:13 AM 07/10/2023   10:04 AM  CMP  Glucose 70 - 99 mg/dL 632  687  91   BUN 8 - 23 mg/dL 24  22  21    Creatinine 0.44 - 1.00 mg/dL 9.03  9.09  9.12   Sodium 135 - 145 mmol/L 138  136  140   Potassium 3.5 - 5.1 mmol/L 3.7  3.7  3.7   Chloride 98 - 111 mmol/L 105  103  106   CO2 22 - 32 mmol/L 26  25  29    Calcium  8.9 - 10.3 mg/dL 8.9  8.9  9.3   Total Protein 6.5 - 8.1 g/dL 6.8  6.5  7.0   Total Bilirubin 0.0 - 1.2 mg/dL 0.5  0.4  0.4   Alkaline Phos 38 - 126 U/L 64  72  79   AST 15 - 41 U/L 49  32  44   ALT 0 - 44 U/L 29  22  32     Lab Results  Component Value Date   WBC 3.1 (L) 08/20/2023   HGB 10.4 (L) 08/20/2023   HCT 33.4 (L) 08/20/2023   MCV 89.8 08/20/2023   PLT 177 08/20/2023   NEUTROABS 1.2 (L) 08/20/2023    ASSESSMENT & PLAN:  Malignant  neoplasm of upper-outer quadrant of left breast in female, estrogen receptor negative (HCC) 04/30/2017: Left lumpectomy: IDC grade 3, 1.7 cm, DCIS, lymphovascular invasion present, margins negative, 0/1 lymph node negative, ER 0%, PR 0%, HER-2 negative ratio 1.37, Ki-67 40%, T1c N0 stage IB    Treatment summary: 1. adjuvant chemotherapy with dose dense Adriamycin  and Cytoxan  4 followed by Taxol  weekly 6 discontinued for neuropathy 2. Followed by radiation started 09/26/2017-10/25/2018  Breast cancer recurrence: mammogram 12/10/2022: Asymmetry/distortion left breast, ultrasound: 2 adjacent irregular masses 2 o'clock position left breast 1.9 cm and 1.2 cm, no suspicious lymph nodes Left breast biopsy 2:00 posterior: Grade 2 IDC with DCIS Left breast biopsy 2:00 anterior: Grade 2 IDC with DCIS ER 0%, PR 0%, HER2 0, Ki-67 30% 01/31/2023: Bone scan: Negative, CT CAP 01/26/2023: Negative for metastatic disease but nodular contour of the liver suggestive of cirrhosis 01/26/2023: Ultrasound left submandibular neck: Normal lymph node 1.2 cm 01/24/2023: MRI breast: The recently biopsied area measured 6.1 cm, skin thickening (post radiation)   Recommendation: 03/12/2023: Bilateral mastectomies: Right breast: Benign left mastectomy: Grade 3 IDC 6 cm with high-grade DCIS, margins negative, 1 intramammary lymph node negative, 0/2 sentinel lymph nodes, ER 0%, PR 0%, HER2 0, Ki-67 30% Followed by adjuvant chemotherapy with CMF x 6 cycles started 05/09/2023 ------------------------------------------------------------------------------------------------------------------------------------------------ Current treatment: Cycle 5 CMF   Chemo toxicities: Fatigue: Extremely severe Chemotherapy-induced anemia: Mild To moderate nausea: Takes antiemetics which appear to be helping her but she does not feel good overall. Neutropenia: We will reduce the dosage of today's treatment. Lymphedema left hand: Patient is working with  physical therapy.      Anemia Hemoglobin decreased from 11.4 to 10.4, likely contributing to reported fatigue. -Monitor hemoglobin levels.    Return to clinic in 3 weeks for cycle 6       No orders of the defined types were placed in this encounter.  The  patient has a good understanding of the overall plan. she agrees with it. she will call with any problems that may develop before the next visit here. Total time spent: 30 mins including face to face time and time spent for planning, charting and co-ordination of care   Viinay K Perla Echavarria, MD 08/20/23

## 2023-08-20 NOTE — Assessment & Plan Note (Signed)
 04/30/2017: Left lumpectomy: IDC grade 3, 1.7 cm, DCIS, lymphovascular invasion present, margins negative, 0/1 lymph node negative, ER 0%, PR 0%, HER-2 negative ratio 1.37, Ki-67 40%, T1c N0 stage IB    Treatment summary: 1. adjuvant chemotherapy with dose dense Adriamycin  and Cytoxan  4 followed by Taxol  weekly 6 discontinued for neuropathy 2. Followed by radiation started 09/26/2017-10/25/2018  Breast cancer recurrence: mammogram 12/10/2022: Asymmetry/distortion left breast, ultrasound: 2 adjacent irregular masses 2 o'clock position left breast 1.9 cm and 1.2 cm, no suspicious lymph nodes Left breast biopsy 2:00 posterior: Grade 2 IDC with DCIS Left breast biopsy 2:00 anterior: Grade 2 IDC with DCIS ER 0%, PR 0%, HER2 0, Ki-67 30% 01/31/2023: Bone scan: Negative, CT CAP 01/26/2023: Negative for metastatic disease but nodular contour of the liver suggestive of cirrhosis 01/26/2023: Ultrasound left submandibular neck: Normal lymph node 1.2 cm 01/24/2023: MRI breast: The recently biopsied area measured 6.1 cm, skin thickening (post radiation)   Recommendation: 03/12/2023: Bilateral mastectomies: Right breast: Benign left mastectomy: Grade 3 IDC 6 cm with high-grade DCIS, margins negative, 1 intramammary lymph node negative, 0/2 sentinel lymph nodes, ER 0%, PR 0%, HER2 0, Ki-67 30% Followed by adjuvant chemotherapy with CMF x 6 cycles started 05/09/2023 ------------------------------------------------------------------------------------------------------------------------------------------------ Current treatment: Cycle 5 CMF   Chemo toxicities: Fatigue: Extremely severe Chemotherapy-induced anemia: Mild To moderate nausea: Takes antiemetics which appear to be helping her but she does not feel good overall. Lymphedema left hand: Patient is working with physical therapy.      Anemia Hemoglobin decreased from 11.4 to 10.5, likely contributing to reported fatigue. -Monitor hemoglobin levels.    Hyperglycemia   -Await current blood glucose results. Continue current management plan.   Return to clinic in 3 weeks for cycle 6

## 2023-08-21 LAB — HEMOGLOBIN A1C
Hgb A1c MFr Bld: 10.6 % — ABNORMAL HIGH (ref 4.8–5.6)
Mean Plasma Glucose: 258 mg/dL

## 2023-08-27 ENCOUNTER — Ambulatory Visit: Payer: Medicare Other | Attending: General Surgery

## 2023-08-27 ENCOUNTER — Encounter: Payer: Self-pay | Admitting: Hematology and Oncology

## 2023-08-27 DIAGNOSIS — I890 Lymphedema, not elsewhere classified: Secondary | ICD-10-CM | POA: Insufficient documentation

## 2023-08-27 DIAGNOSIS — C50412 Malignant neoplasm of upper-outer quadrant of left female breast: Secondary | ICD-10-CM | POA: Insufficient documentation

## 2023-08-27 DIAGNOSIS — Z9013 Acquired absence of bilateral breasts and nipples: Secondary | ICD-10-CM | POA: Diagnosis not present

## 2023-08-27 DIAGNOSIS — Z171 Estrogen receptor negative status [ER-]: Secondary | ICD-10-CM | POA: Diagnosis not present

## 2023-08-27 DIAGNOSIS — M25612 Stiffness of left shoulder, not elsewhere classified: Secondary | ICD-10-CM | POA: Diagnosis not present

## 2023-08-27 DIAGNOSIS — M25611 Stiffness of right shoulder, not elsewhere classified: Secondary | ICD-10-CM | POA: Diagnosis not present

## 2023-08-27 NOTE — Therapy (Signed)
 OUTPATIENT PHYSICAL THERAPY  UPPER EXTREMITY ONCOLOGY TREATMENT  Patient Name: Audrey Peters MRN: [id number] DOB:April 21, 1956, 68 y.o., female Today's Date: 08/27/2023  END OF SESSION:  PT End of Session - 08/27/23 1020     Visit Number 18    Number of Visits 23    Date for PT Re-Evaluation 09/16/23    Authorization Type none needed    PT Start Time 1016   pt arrived late   PT Stop Time 1058    PT Time Calculation (min) 42 min    Activity Tolerance Patient tolerated treatment well    Behavior During Therapy WFL for tasks assessed/performed                 Past Medical History:  Diagnosis Date   Anemia yrs ago   Arthritis    Breast cancer (HCC)    Cancer (HCC)    recent dx in breast   Carpal tunnel syndrome of right wrist    Diabetes mellitus without complication (HCC)    dx 2008   Headache    sinus   Hypertension    Peripheral neuropathy 2024   Hands and Feet   Personal history of chemotherapy    Personal history of radiation therapy    Vaginal delivery 1983   Past Surgical History:  Procedure Laterality Date   BREAST BIOPSY     BREAST BIOPSY Left 01/01/2023   US  LT BREAST BX W LOC DEV 1ST LESION IMG BX SPEC US  GUIDE 01/01/2023 [facility]   BREAST BIOPSY Left 01/01/2023   US  LT BREAST BX W LOC DEV EA ADD LESION IMG BX SPEC US  GUIDE 01/01/2023 [facility]   BREAST LUMPECTOMY Left    BREAST LUMPECTOMY WITH RADIOACTIVE SEED AND SENTINEL LYMPH NODE BIOPSY Left 04/30/2017   Procedure: LEFT BREAST LUMPECTOMY WITH RADIOACTIVE SEED AND LEFT SENTINEL LYMPH NODE BIOPSY ERAS PATHWAY;  Surgeon: Vanderbilt Ned, MD;  Location: [facility];  Service: General;  Laterality: Left;   COLONOSCOPY WITH PROPOFOL  N/A 05/28/2016   Procedure: COLONOSCOPY WITH PROPOFOL ;  Surgeon: Gladis MARLA Louder, MD;  Location: [facility];  Service: Endoscopy;  Laterality: N/A;   DILATATION & CURETTAGE/HYSTEROSCOPY WITH MYOSURE N/A 04/20/2020   Procedure: DILATATION & CURETTAGE/HYSTEROSCOPY  WITH MYOSURE;  Surgeon: Ozan, Jennifer, DO;  Location: [facility];  Service: Gynecology;  Laterality: N/A;   DILATION AND CURETTAGE OF UTERUS     HYSTEROSCOPY WITH D & C N/A 07/21/2015   Procedure: DILATATION AND CURETTAGE /HYSTEROSCOPY with myosure;  Surgeon: Delon Prude, DO;  Location: [facility];  Service: Gynecology;  Laterality: N/A;   MASTECTOMY W/ SENTINEL NODE BIOPSY Left 03/12/2023   Procedure: LEFT MASTECTOMY WITH SENTINEL LYMPH NODE BIOPSY;  Surgeon: Aron Shoulders, MD;  Location: [facility];  Service: General;  Laterality: Left;   PORTACATH PLACEMENT Right 04/30/2017   Procedure: INSERTION PORT-A-CATH;  Surgeon: Vanderbilt Ned, MD;  Location: [facility];  Service: General;  Laterality: Right;   PORTACATH PLACEMENT N/A 03/12/2023   Procedure: PORT PLACEMENT WITH ULTRASOUND GUIDANCE;  Surgeon: Aron Shoulders, MD;  Location: [facility];  Service: General;  Laterality: N/A;   SIMPLE MASTECTOMY WITH AXILLARY SENTINEL NODE BIOPSY Right 03/12/2023   Procedure: RIGHT MASTECTOMY;  Surgeon: Aron Shoulders, MD;  Location:  SUR[facility]ERY C[facility];  Service: General;  Laterality: Right;   Patient Active Problem List   Diagnosis Date Noted   Recurrent breast cancer, left (HCC) 03/12/2023   Atrophy of vagina 09/12/2020   Bilateral lower extremity  edema 09/12/2020   History of ductal carcinoma in situ of breast 09/12/2020   Hyperglycemia due to type 2 diabetes mellitus (HCC) 09/12/2020   Knee pain 09/12/2020   Long term (current) use of insulin  (HCC) 09/12/2020   Lumbosacral spondylosis without myelopathy 09/12/2020   Mixed hyperlipidemia 09/12/2020   Obstructive sleep apnea syndrome 09/12/2020   Ovarian cyst 09/12/2020   Overweight 09/12/2020   Personal history of malignant neoplasm of breast 09/12/2020   Postmenopausal bleeding 09/12/2020   Pure hypercholesterolemia 09/12/2020   Sciatica 09/12/2020   Morbid obesity (HCC) 09/12/2020   Chemotherapy-induced  peripheral neuropathy (HCC) 01/22/2018   Encounter for antineoplastic chemotherapy 06/07/2017   Port-A-Cath in place 05/24/2017   Genetic testing 05/23/2017   Malignant neoplasm of upper-outer quadrant of left breast in female, estrogen receptor negative (HCC) 04/22/2017   Essential hypertension 01/30/2015   Diabetes mellitus (HCC) 01/30/2015    REFERRING PROVIDER: Dr. Aron    REFERRING DIAG:  Diagnosis  C50.412,Z17.1 (ICD-10-CM) - Malignant neoplasm of upper-outer quadrant of left breast in female, estrogen receptor negative (HCC)    THERAPY DIAG:  Malignant neoplasm of upper-outer quadrant of left breast in female, estrogen receptor negative (HCC)  Stiffness of left shoulder, not elsewhere classified  Status post mastectomy, bilateral  Stiffness of right shoulder, not elsewhere classified  Lymphedema, not elsewhere classified  ONSET DATE: 12/2022  Rationale for Evaluation and Treatment: Rehabilitation  SUBJECTIVE:                                                                                                                                                                                           SUBJECTIVE STATEMENT:  Overall I think I'm doing well with my arm but it does still fluctuate. My fingers don't feel tight like they did when I started and I can tell the back of my hand isn't better too. Still swollen, but not as much. Dr. Aron had to drain more fluid again last week and I can tell it's still filling back up again. I have one more chemo at the end of the month and then I'll be done with that! I would like to come back one more time after that last chemo to make sure I'm still doing good.   PERTINENT HISTORY: New/recurrent breast cancer on the left. 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 Port placement and bil mastectomy 03/12/23 with removal of 2 negative nodes. Treatment in 2018 with lumpectomy, SLNB, chemo and XRT.  1 negative node removed in 2018.    aspirarted frorm Rt and 150 from Lt 04/16/23. Seroma catheters may be needed. Will be doing chemo again.  PAIN:  Are you having pain? NO    PRECAUTIONS: Lt lymphedema   RED FLAGS: None   WEIGHT BEARING RESTRICTIONS: No  FALLS:  Has patient fallen in last 6 months? No  LIVING ENVIRONMENT: Lives with: lives with their family and lives with their spouse  OCCUPATION: Retired   LEISURE: nothing really,  get back to water aerobics.    HAND DOMINANCE: right   PRIOR LEVEL OF FUNCTION: Independent  PATIENT GOALS: get more movement back    OBJECTIVE:  COGNITION: Overall cognitive status: Within functional limits for tasks assessed   PALPATION: sloshing full chest bilateral chest wall with return of seroma - note sent to Dr. Aron   OBSERVATIONS / OTHER ASSESSMENTS: Left incision well healed - Rt incision has a small place covered with a band-aid that is still open and draining - white tissue present.   Lt back of hand and wrist appear larger with less visible landmarks  SENSATION: Numbness left tricep region.   POSTURE: rounded shoulders, forward head    UPPER EXTREMITY AROM/PROM:  A/PROM RIGHT   eval  RIGHT  05/07/2023  Shoulder extension 50   Shoulder flexion 115 - incision 168  Shoulder abduction 90 - pulls in incision 157 no pulling   Shoulder internal rotation    Shoulder external rotation 75     (Blank rows = not tested)  A/PROM LEFT   eval LEFT 05/07/2023 06/04/23 06/25/23 07/18/23  Shoulder extension 50      Shoulder flexion 117 - feels cording like arm pull but not visible 152 ( no pulling)  120 - feels tight upper arm / 145 post stretching  140 140 - pn axilla   Shoulder abduction 85 148 130 - pull in upper arm  150 150 - no pn  Shoulder internal rotation       Shoulder external rotation 75        (Blank rows = not tested)  UPPER EXTREMITY STRENGTH:   LYMPHEDEMA ASSESSMENTS:   LANDMARK RIGHT  eval  At axilla    15 cm proximal to olecranon  process 39.7  10 cm proximal to olecranon process 38.7  Olecranon process 32.5  15 cm proximal to ulnar styloid process 27.3  10 cm proximal to ulnar styloid process 25.1  Just proximal to ulnar styloid process 18  Across hand at thumb web space 20.5  At base of 2nd digit 6.1  (Blank rows = not tested)  [facility] LEFT  eval 06/25/23 07/09/23  07/16/23 07/30/23 08/27/23  At axilla         15 cm proximal to olecranon process 40.5 40.6 40 36.4 37.4 37.2  10 cm proximal to olecranon process 40.5 39.3 38.5 35.2 36.3 35.5  Olecranon process 36.5 35 35 28.7 28.9 28.4  15 cm proximal to ulnar styloid process 29.2 30.2 30 28.7 28 28.4  10 cm proximal to ulnar styloid process 25.8 26.9 27 26.2 25.2 25.6  Just proximal to ulnar styloid process 18.8 19.5 19 18.6 19.4 19.2  Across hand at thumb web space 20.3 22 22  20.6 21.8 20.7  At base of 2nd digit 5.8 6.3 6.5 6.2 6.1 6.5  (Blank rows = not tested) 10cm  Rt: 2981 Lt: 3395 difference  QUICK DASH SURVEY: 45%   TODAY'S TREATMENT:  DATE:  08/27/23: Manual Therapy Circumference measurements taken, reductions noted since last measured.  MLD to Lt UE: Short neck, (noted pt had a Rt SLNB in July with mastectomy so instructed her to only use lateral anastomosis), superficial and deep abdominals, Lt inguinal nodes and Lt axillo-inguinal anastomosis, then Lt UE working proximal to distal, moving fluid from upper inner arm outwards, and doing both sides of forearm moving fluid, and dorsal wrist and hand then towards pathway spending extra time at dorsal hand then retracing all steps.  Assisted pt with velcro wrap and proper way to don; Only wearing hand wrap without anything on fingers today   08/05/2023 Pt has on velcro garment that she put in in a rush. Too low at wrist and hand wrap barely on. Manual  Therapy MLD to Lt UE: Short neck, (noted pt had a Rt SLNB in July with mastectomy so instructed her to only use lateral anastomosis) Lt inguinal nodes and Lt axillo-inguinal anastomosis, then Lt UE working proximal to distal, moving fluid from upper inner arm outwards, and doing both sides of forearm moving fluid, and dorsal wrist and hand then towards pathway spending extra time in any areas of fibrosis then retracing all steps. Performed in sitting with pt sitting in chair and had pt practice  all steps. She required moderate VC's and TC's initially, but improved nicely with practice. Gave written handout with each step listed. Assisted pt with velcro wrap and proper way to don; Only wearing hand wrap without anything on fingers today and advised her fingers will swell without the glove  07/30/23: Therapeutic Exercises Pulleys into flex and abd x 2 mins each with VC's to remind pt to decrease Lt scapular compensation Roll yellow ball up wall into flex and Lt abd x 10 each Supine over half foam roll: Bil UE horz abd, then bil UE scaption into a V x 10 each, and ended with bil UE abd in a snow angel x 10, 5 sec holds returning therapist demo for each. Manual Therapy MLD to Lt UE: Short neck, (noted pt had a Rt SLNB in July with mastectomy so instructed her to only use lateral anastomosis) Lt inguinal nodes and Lt axillo-inguinal anastomosis, then Lt UE working proximal to distal, moving fluid from upper inner arm outwards, and doing both sides of forearm moving fluid, and dorsal wrist and hand then towards pathway spending extra time in any areas of fibrosis then retracing all steps.  07/25/23: Pt arrives with velcro garment appropriately donned. Her dorsal hand was visibly swollen despite wearing her glove. When she reapplied it after going to the bathroom it was bunched at her wrist so instructed her to make sure she pulls this up over the velcro garment so as not to trap the fluid in her hand.  She thinks she normally does this but will pay more attention.  Therapeutic Exercises Pulleys into flex and abd x 2 mins each, tactile and VC's to remind pt to decrease scapular compensation, especially with abduction Roll yellow ball up wall into flex and Lt UE abd x 10 each Manual Therapy MLD to Lt UE: Short neck, Rt axillary nodes and establishment of anterior inter-axillary anastomosis, Lt inguinal nodes and Lt axillo-inguinal anastomosis, then L UE working proximal to distal, moving fluid from upper inner arm outwards, and doing both sides of forearm moving fluid, and dorsal wrist and hand then towards pathways spending extra time in any areas of fibrosis then retracing all steps.   07/23/23 Pt  arrives wearing velcro and has it pulled up nicely Pulleys into flexion and abduction x each  Wall ball flexion x 5 Doorway stretch Lt only 10 x 3  Supine flexion dowel x 10 Supine chest stretch 3x30 MLD to Lt UE: Short neck, Rt axillary nodes and establishment of anterior inter-axillary anastomosis, Lt inguinal nodes and Lt axillo-inguinal anastomosis, then L UE working proximal to distal, moving fluid from upper inner arm outwards, and doing both sides of forearm moving fluid, and dorsal wrist and hand then towards pathways spending extra time in any areas of fibrosis then retracing all steps. PROM into flexion  07/18/23: Manual Therapy Reassessed fit of garment and worked on getting the elbow crease in the right place.  We also had to look up instructions on why the hand wasn't fitting correctly.  It turns out that you were supposed to order the Rt hand to wear it black on the left hand so we discussed returing for the Rt hand vs wearing it inside out and we decided to wear it inside out for now.   Pulleys into flexion and abduction x each  Supine flexion dowel x 10 Supine chest stretch 3x30 Discussed TE and movement with compression being important for lymphatic  movement  07/16/23: Manual Therapy Pt brought her new velcro garment so spent time instructing pt how to don properly. She tried this at beginning of session and then again at the end after MLD and was mostly independent with donning with very few VC's.  MLD to Lt UE: Short neck, superficial and deep abdominals, Rt axillary nodes and establishment of anterior inter-axillary anastomosis, Lt inguinal nodes and Lt axillo-inguinal anastomosis, then L UE working proximal to distal, moving fluid from upper inner arm outwards, and doing both sides of forearm moving fluid, and dorsal wrist and hand then towards pathways spending extra time in any areas of fibrosis then retracing all steps.    PATIENT EDUCATION:  Education details: per today's note Person educated: Patient Education method: Chief Technology Officer Education comprehension: verbalized understanding, returned demonstration, and needs further education  HOME EXERCISE PROGRAM: Post op breast with wall flexion as an option instead of hands clasped  Self MLD Use of sleeve  ASSESSMENT:  CLINICAL IMPRESSION: Pt comes in reporting she feels overall she has been managing her lymphedema symptoms well though would like to return one more time for final assess after last chemo. She continues to also require having seroma drained. Dr. Aron wants to put a drain back in but pt refuses this not wanting to deal with anymore drains. She reports feeling like this area is still swelling. Educated her that this may cont until well after chemo as chemo can slow healing. Her circumference has reduced in dorsal hand and elbow and forearm so informed pt that she is seeming to do well so far with self managing lymphedema symptoms. She also reports doing self MLD daily. Continued with MLD today.   OBJECTIVE IMPAIRMENTS: decreased activity tolerance, decreased knowledge of condition, decreased knowledge of use of DME, decreased ROM, decreased strength, and pain.    ACTIVITY LIMITATIONS: carrying, lifting, and reach over head  PARTICIPATION LIMITATIONS: cleaning, laundry, and community activity  PERSONAL FACTORS: Time since onset of injury/illness/exacerbation and 3+ comorbidities: radiation hx to axilla, SLNB, disease recurrence.    are also affecting patient's functional outcome.   REHAB POTENTIAL: Good  CLINICAL DECISION MAKING: Evolving/moderate complexity  EVALUATION COMPLEXITY: Moderate  GOALS: Goals reviewed with patient? Yes  SHORT  TERM GOALS: Target date: 05/07/23  Pt will be educated on lymphedema - physiology, healthy habits, importance of treatment, and treatment options  Baseline: Goal status: MET   LONG TERM GOALS: Target date: 07/10/23  Pt will obtain compression sleeve and glove for use during treatment with education on use Baseline:  Goal status: MET  2.  Pt will improve bil shoulder reach to [facility] to allow for reach into the cabinets Baseline:  Goal status: MET  3.  Pt will be ind with self MLD for the Lt UE  Baseline:  Goal status: IN PROGRESS  4.  Pt will decrease QDASH to 15% or less to demonstrate improved mobility in the UE Baseline:  (38.64 08/05/2023) Goal status: IN PROGRESS  5.  Pt will be ind with final HEP Baseline:  Goal status: IN PROGRESS   PLAN:  PT FREQUENCY:  1x-2x/week, or 6 visits  PT DURATION: 6 weeks   PLANNED INTERVENTIONS: Therapeutic exercises, Neuromuscular re-education, Patient/Family education, Self Care, DME instructions, Manual therapy, and Re-evaluation  PLAN FOR NEXT SESSION:  re-Assess goals, is dorsal hand better with fixing glove? ready for D/C? Bil shoulder AAROM/PROM as able , MLD Lt UE, (Pt wanted to return after last chemo;needs review of MLD, garments  Added: 05/01/23: no coverage at [facility], faxed order to [facility] due to [employer] [place] 05/06/23: from [facility]: she would have to pay OOP but then could get reimbursed by her BCBS? Will let know if pt would like  to do this 05/14/23: called [facility] and no coverage is guaranteed so she will not order there.  PT will check and send 1 more place before pt self orders.  She has ordering info.  05/16/23 - entered into [facility] portal 06/04/23; has processed  06/25/23 - entered velcro garments into [facility]    Aden Berwyn Caldron, PTA 08/27/2023, 11:05 AM  [facility] [address] [place], [place], [address] Phone: [phone]   Fax:  [phone]

## 2023-09-06 DIAGNOSIS — C50412 Malignant neoplasm of upper-outer quadrant of left female breast: Secondary | ICD-10-CM | POA: Diagnosis not present

## 2023-09-06 DIAGNOSIS — N6489 Other specified disorders of breast: Secondary | ICD-10-CM | POA: Diagnosis not present

## 2023-09-06 DIAGNOSIS — Z171 Estrogen receptor negative status [ER-]: Secondary | ICD-10-CM | POA: Diagnosis not present

## 2023-09-09 ENCOUNTER — Encounter: Payer: Self-pay | Admitting: *Deleted

## 2023-09-09 DIAGNOSIS — Z171 Estrogen receptor negative status [ER-]: Secondary | ICD-10-CM

## 2023-09-10 ENCOUNTER — Other Ambulatory Visit: Payer: Medicare Other

## 2023-09-10 ENCOUNTER — Ambulatory Visit: Payer: Medicare Other | Admitting: Hematology and Oncology

## 2023-09-10 ENCOUNTER — Ambulatory Visit: Payer: Medicare Other

## 2023-09-11 ENCOUNTER — Inpatient Hospital Stay (HOSPITAL_BASED_OUTPATIENT_CLINIC_OR_DEPARTMENT_OTHER): Payer: Medicare Other | Admitting: Hematology and Oncology

## 2023-09-11 ENCOUNTER — Inpatient Hospital Stay (HOSPITAL_BASED_OUTPATIENT_CLINIC_OR_DEPARTMENT_OTHER): Payer: Medicare Other

## 2023-09-11 ENCOUNTER — Inpatient Hospital Stay: Payer: Medicare Other

## 2023-09-11 VITALS — BP 129/65 | HR 79 | Temp 97.2°F | Resp 18 | Ht 64.0 in | Wt 221.2 lb

## 2023-09-11 DIAGNOSIS — R739 Hyperglycemia, unspecified: Secondary | ICD-10-CM | POA: Diagnosis not present

## 2023-09-11 DIAGNOSIS — D6481 Anemia due to antineoplastic chemotherapy: Secondary | ICD-10-CM | POA: Diagnosis not present

## 2023-09-11 DIAGNOSIS — Z79631 Long term (current) use of antimetabolite agent: Secondary | ICD-10-CM | POA: Diagnosis not present

## 2023-09-11 DIAGNOSIS — Z171 Estrogen receptor negative status [ER-]: Secondary | ICD-10-CM | POA: Diagnosis not present

## 2023-09-11 DIAGNOSIS — C50412 Malignant neoplasm of upper-outer quadrant of left female breast: Secondary | ICD-10-CM | POA: Diagnosis not present

## 2023-09-11 DIAGNOSIS — Z5111 Encounter for antineoplastic chemotherapy: Secondary | ICD-10-CM | POA: Diagnosis not present

## 2023-09-11 DIAGNOSIS — Z95828 Presence of other vascular implants and grafts: Secondary | ICD-10-CM

## 2023-09-11 LAB — CBC WITH DIFFERENTIAL (CANCER CENTER ONLY)
Abs Immature Granulocytes: 0.13 10*3/uL — ABNORMAL HIGH (ref 0.00–0.07)
Basophils Absolute: 0 10*3/uL (ref 0.0–0.1)
Basophils Relative: 1 %
Eosinophils Absolute: 0.1 10*3/uL (ref 0.0–0.5)
Eosinophils Relative: 2 %
HCT: 33.4 % — ABNORMAL LOW (ref 36.0–46.0)
Hemoglobin: 10.7 g/dL — ABNORMAL LOW (ref 12.0–15.0)
Immature Granulocytes: 3 %
Lymphocytes Relative: 25 %
Lymphs Abs: 1.3 10*3/uL (ref 0.7–4.0)
MCH: 28.3 pg (ref 26.0–34.0)
MCHC: 32 g/dL (ref 30.0–36.0)
MCV: 88.4 fL (ref 80.0–100.0)
Monocytes Absolute: 0.8 10*3/uL (ref 0.1–1.0)
Monocytes Relative: 16 %
Neutro Abs: 2.7 10*3/uL (ref 1.7–7.7)
Neutrophils Relative %: 53 %
Platelet Count: 238 10*3/uL (ref 150–400)
RBC: 3.78 MIL/uL — ABNORMAL LOW (ref 3.87–5.11)
RDW: 15.5 % (ref 11.5–15.5)
WBC Count: 5.1 10*3/uL (ref 4.0–10.5)
nRBC: 0 % (ref 0.0–0.2)

## 2023-09-11 LAB — CMP (CANCER CENTER ONLY)
ALT: 26 U/L (ref 0–44)
AST: 41 U/L (ref 15–41)
Albumin: 3.6 g/dL (ref 3.5–5.0)
Alkaline Phosphatase: 75 U/L (ref 38–126)
Anion gap: 6 (ref 5–15)
BUN: 19 mg/dL (ref 8–23)
CO2: 28 mmol/L (ref 22–32)
Calcium: 9.2 mg/dL (ref 8.9–10.3)
Chloride: 101 mmol/L (ref 98–111)
Creatinine: 0.91 mg/dL (ref 0.44–1.00)
GFR, Estimated: >60 mL/min (ref >60)
Glucose, Bld: 377 mg/dL — ABNORMAL HIGH (ref 70–99)
Potassium: 4.3 mmol/L (ref 3.5–5.1)
Sodium: 135 mmol/L (ref 135–145)
Total Bilirubin: 0.5 mg/dL (ref 0.0–1.2)
Total Protein: 7.1 g/dL (ref 6.5–8.1)

## 2023-09-11 MED ORDER — PALONOSETRON HCL INJECTION 0.25 MG/5ML
0.2500 mg | Freq: Once | INTRAVENOUS | Status: AC
Start: 2023-09-11 — End: 2023-09-11
  Administered 2023-09-11: 0.25 mg via INTRAVENOUS
  Filled 2023-09-11: qty 5

## 2023-09-11 MED ORDER — SODIUM CHLORIDE 0.9% FLUSH
10.0000 mL | INTRAVENOUS | Status: DC | PRN
  Administered 2023-09-11: 10 mL

## 2023-09-11 MED ORDER — SODIUM CHLORIDE 0.9 % IV SOLN: Freq: Once | INTRAVENOUS | Status: AC

## 2023-09-11 MED ORDER — HEPARIN SOD (PORK) LOCK FLUSH 100 UNIT/ML IV SOLN
500.0000 [IU] | Freq: Once | INTRAVENOUS | Status: AC | PRN
  Administered 2023-09-11: 500 [IU]

## 2023-09-11 MED ORDER — SODIUM CHLORIDE 0.9 % IV SOLN
500.0000 mg/m2 | Freq: Once | INTRAVENOUS | Status: AC
  Administered 2023-09-11: 1000 mg via INTRAVENOUS
  Filled 2023-09-11: qty 50

## 2023-09-11 MED ORDER — FLUOROURACIL CHEMO INJECTION 2.5 GM/50ML
400.0000 mg/m2 | Freq: Once | INTRAVENOUS | Status: AC
  Administered 2023-09-11: 850 mg via INTRAVENOUS
  Filled 2023-09-11: qty 17

## 2023-09-11 MED ORDER — SODIUM CHLORIDE 0.9% FLUSH
10.0000 mL | INTRAVENOUS | Status: DC | PRN
Start: 2023-09-11 — End: 2023-09-11
  Administered 2023-09-11: 10 mL via INTRAVENOUS

## 2023-09-11 MED ORDER — METHOTREXATE SODIUM CHEMO INJECTION (PF) 50 MG/2ML
30.0000 mg/m2 | Freq: Once | INTRAMUSCULAR | Status: AC
  Administered 2023-09-11: 62.75 mg via INTRAVENOUS
  Filled 2023-09-11: qty 2.51

## 2023-09-11 NOTE — Assessment & Plan Note (Signed)
04/30/2017: Left lumpectomy: IDC grade 3, 1.7 cm, DCIS, lymphovascular invasion present, margins negative, 0/1 lymph node negative, ER 0%, PR 0%, HER-2 negative ratio 1.37, Ki-67 40%, T1c N0 stage IB    Treatment summary: 1. adjuvant chemotherapy with dose dense Adriamycin and Cytoxan 4 followed by Taxol weekly 6 discontinued for neuropathy 2. Followed by radiation started 09/26/2017-10/25/2018  Breast cancer recurrence: mammogram 12/10/2022: Asymmetry/distortion left breast, ultrasound: 2 adjacent irregular masses 2 o'clock position left breast 1.9 cm and 1.2 cm, no suspicious lymph nodes Left breast biopsy 2:00 posterior: Grade 2 IDC with DCIS Left breast biopsy 2:00 anterior: Grade 2 IDC with DCIS ER 0%, PR 0%, HER2 0, Ki-67 30% 01/31/2023: Bone scan: Negative, CT CAP 01/26/2023: Negative for metastatic disease but nodular contour of the liver suggestive of cirrhosis 01/26/2023: Ultrasound left submandibular neck: Normal lymph node 1.2 cm 01/24/2023: MRI breast: The recently biopsied area measured 6.1 cm, skin thickening (post radiation)   Recommendation: 03/12/2023: Bilateral mastectomies: Right breast: Benign left mastectomy: Grade 3 IDC 6 cm with high-grade DCIS, margins negative, 1 intramammary lymph node negative, 0/2 sentinel lymph nodes, ER 0%, PR 0%, HER2 0, Ki-67 30% Followed by adjuvant chemotherapy with CMF x 6 cycles started 05/09/2023 ------------------------------------------------------------------------------------------------------------------------------------------------ Current treatment: Cycle 6 CMF   Chemo toxicities: Fatigue: Extremely severe Chemotherapy-induced anemia: Mild To moderate nausea: Takes antiemetics which appear to be helping her but she does not feel good overall. Neutropenia: We reduced the dosage of her chemotherapy Lymphedema left hand: Patient is working with physical therapy.       This concludes her chemotherapy.  We will remove her port.

## 2023-09-11 NOTE — Progress Notes (Signed)
Patient Care Team: [name], MD as PCP - General (Hematology and Oncology) [name], MD as Consulting Physician (Hematology and Oncology) Lonie Peak, MD as Attending Physician (Radiation Oncology) Harriette Bouillon, MD as Consulting Physician (General Surgery) Axel Filler Larna Daughters, NP as Nurse Practitioner (Hematology and Oncology)  DIAGNOSIS:  Encounter Diagnosis  Name Primary?   Malignant neoplasm of upper-outer quadrant of left breast in female, estrogen receptor negative (HCC) Yes    SUMMARY OF ONCOLOGIC HISTORY: Oncology History  Malignant neoplasm of upper-outer quadrant of left breast in female, estrogen receptor negative (HCC)  04/05/2017 Initial Diagnosis   Left breast asymmetry by ultrasound measured 1.3 cm at 2:30 position 10 cm from nipple, no axillary lymph nodes; biopsy IDC grade 2, ER 0%, PR 0%, HER-2 negative ratio 1.37, Ki-67 40%, T1c N0 stage IB AJCC 8    04/30/2017 Surgery   Left lumpectomy: IDC grade 3, 1.7 cm, DCIS, lymphovascular invasion present, margins negative, 0/1 lymph node negative, ER 0%, PR 0%, HER-2 negative ratio 1.37, Ki-67 40%, T1c N0 stage IB   05/22/2017 Genetic Testing   Patient had genetic testing due to a personal history of triple negative breast cancer.  The Common Hereditary Cancer Panel was ordered. The Hereditary Gene Panel offered by [facility] includes sequencing and/or deletion duplication testing of the following 46 genes: APC, ATM, AXIN2, BARD1, BMPR1A, BRCA1, BRCA2, BRIP1, CDH1, CDKN2A (p14ARF), CDKN2A (p16INK4a), CHEK2, CTNNA1, DICER1, EPCAM (Deletion/duplication testing only), GREM1 (promoter region deletion/duplication testing only), KIT, MEN1, MLH1, MSH2, MSH3, MSH6, MUTYH, NBN, NF1, NHTL1, PALB2, PDGFRA, PMS2, POLD1, POLE, PTEN, RAD50, RAD51C, RAD51D, SDHB, SDHC, SDHD, SMAD4, SMARCA4. STK11, TP53, TSC1, TSC2, and VHL.  The following genes were evaluated for sequence changes only: SDHA and HOXB13 c.251G>A variant only.     Results: No pathogenic mutations identified.  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  The date of this test report is 05/22/2017.    05/24/2017 - 08/30/2017 Chemotherapy   Dose dense Adriamycin and Cytoxan 4 followed by Taxol weekly 5 (stopped early for neuropathy)    09/26/2017 - 10/22/2017 Radiation Therapy   Adjuvant radiation therapy   12/10/2022 Relapse/Recurrence   Mammogram detected distortion, 2 irregular masses 1.9 cm and 1.2 cm: Biopsy grade 2 IDC with DCIS triple negative, scans negative, MRI breast: 6.1 cm   03/12/2023 Surgery   Bilateral mastectomies: Right breast: Benign  left mastectomy: Grade 3 IDC 6 cm with high-grade DCIS, margins negative, 1 intramammary lymph node negative, 0/2 sentinel lymph nodes, ER 0%, PR 0%, HER2 0, Ki-67 30%   05/09/2023 -  Chemotherapy   Patient is on Treatment Plan : BREAST Adjuvant CMF IV q21d       CHIEF COMPLIANT: Cycle 6 CMF  HISTORY OF PRESENT ILLNESS:  History of Present Illness   The patient presents for the completion of chemotherapy treatment.  She has completed her final chemotherapy session and is experiencing significant fatigue and diarrhea following the last treatment, despite a reduced dose.  Diarrhea typically begins strongly on the second day post-treatment, occurring two to four times a day, and decreases by the third day. She manages the diarrhea with Imodium, taking two tablets at the onset and one additional tablet after each subsequent episode, totaling four to five tablets. The diarrhea subsides in the following two weeks, with only occasional episodes.  Fatigue is persistent and contributes to an overall feeling of being drained. This is a common side effect of chemotherapy.  She experiences neuropathy, described as affecting the tips  and outside of her hands and feet.  Ongoing lymphedema fluctuates in severity, and she is working with a Paramedic for management.  Issues with blood sugar levels  have been a persistent problem, and she is in communication with her primary care doctor regarding these issues. Mild anemia has shown some improvement.         ALLERGIES:  is allergic to canagliflozin, empagliflozin, other, sulfa antibiotics, and ciprofloxacin.  MEDICATIONS:  Current Outpatient Medications  Medication Sig Dispense Refill   acetaminophen (TYLENOL) 650 MG CR tablet Take 650 mg by mouth every 8 (eight) hours as needed for pain.     atorvastatin (LIPITOR) 10 MG tablet 1 tablet     clotrimazole-betamethasone (LOTRISONE) cream      Continuous Blood Gluc Sensor (FREESTYLE LIBRE 2 SENSOR) MISC .     DULoxetine (CYMBALTA) 30 MG capsule Take 30 mg by mouth daily.     gabapentin (NEURONTIN) 100 MG capsule Take 100 mg by mouth 3 (three) times daily.     glucose blood (ONETOUCH VERIO) test strip use to check blood sugar     glucose blood test strip by miscellaneous route.     Insulin Pen Needle (B-D ULTRAFINE III SHORT PEN) 31G X 8 MM MISC 3 (three) times daily.     insulin regular human CONCENTRATED (HUMULIN R U-500 KWIKPEN) 500 UNIT/ML kwikpen 150 units before breakfast, 80 units before lunch, 80 units before evening meal     lidocaine-prilocaine (EMLA) cream Apply to affected area once 30 g 3   losartan (COZAAR) 100 MG tablet Take 100 mg by mouth daily.     metFORMIN (GLUCOPHAGE-XR) 500 MG 24 hr tablet 2 tablets with evening meal     methocarbamol (ROBAXIN) 500 MG tablet Take 1 tablet (500 mg total) by mouth every 6 (six) hours as needed for muscle spasms. 20 tablet 3   ondansetron (ZOFRAN) 8 MG tablet Take 1 tablet (8 mg total) by mouth every 8 (eight) hours as needed for nausea or vomiting. Start on the third day after chemotherapy. 30 tablet 1   ONETOUCH VERIO test strip 2 (two) times daily.     oxyCODONE (OXY IR/ROXICODONE) 5 MG immediate release tablet Take 1 tablet (5 mg total) by mouth every 4 (four) hours as needed for moderate pain. 30 tablet 0   pregabalin (LYRICA) 150 MG  capsule 1 capsule     prochlorperazine (COMPAZINE) 10 MG tablet Take 1 tablet (10 mg total) by mouth every 6 (six) hours as needed for nausea or vomiting. 30 tablet 1   Semaglutide,0.25 or 0.5MG /DOS, 2 MG/1.5ML SOPN Inject 0.25 mg into the skin once a week.     senna (SENOKOT) 8.6 MG TABS tablet Take 1 tablet (8.6 mg total) by mouth 2 (two) times daily. 60 tablet 0   triamterene-hydrochlorothiazide (MAXZIDE-25) 37.5-25 MG tablet Take 1 tablet by mouth every morning.     No current facility-administered medications for this visit.   Facility-Administered Medications Ordered in Other Visits  Medication Dose Route Frequency Provider Last Rate Last Admin   cyclophosphamide (CYTOXAN) 1,000 mg in sodium chloride 0.9 % 250 mL chemo infusion  500 mg/m2 (Treatment Plan Recorded) Intravenous Once [name], MD 600 mL/hr at 09/11/23 1342 1,000 mg at 09/11/23 1342   fluorouracil (ADRUCIL) chemo injection 850 mg  400 mg/m2 (Treatment Plan Recorded) Intravenous Once [name], MD       methotrexate (PF) chemo injection 62.75 mg  30 mg/m2 (Treatment Plan Recorded) Intravenous Once [name], MD  PHYSICAL EXAMINATION: ECOG PERFORMANCE STATUS: 1 - Symptomatic but completely ambulatory  Vitals:   09/11/23 1155  BP: 129/65  Pulse: 79  Resp: 18  Temp: (!) 97.2 F (36.2 C)  SpO2: 100%   Filed Weights   09/11/23 1155  Weight: 221 lb 3.2 oz (100.3 kg)      LABORATORY DATA:  I have reviewed the data as listed    Latest Ref Rng & Units 09/11/2023   11:24 AM 08/20/2023   11:28 AM 08/01/2023    9:13 AM  CMP  Glucose 70 - 99 mg/dL 161  096  045   BUN 8 - 23 mg/dL 19  24  22    Creatinine 0.44 - 1.00 mg/dL 4.09  8.11  9.14   Sodium 135 - 145 mmol/L 135  138  136   Potassium 3.5 - 5.1 mmol/L 4.3  3.7  3.7   Chloride 98 - 111 mmol/L 101  105  103   CO2 22 - 32 mmol/L 28  26  25    Calcium 8.9 - 10.3 mg/dL 9.2  8.9  8.9   Total Protein 6.5 - 8.1 g/dL 7.1  6.8  6.5   Total Bilirubin 0.0  - 1.2 mg/dL 0.5  0.5  0.4   Alkaline Phos 38 - 126 U/L 75  64  72   AST 15 - 41 U/L 41  49  32   ALT 0 - 44 U/L 26  29  22      Lab Results  Component Value Date   WBC 5.1 09/11/2023   HGB 10.7 (L) 09/11/2023   HCT 33.4 (L) 09/11/2023   MCV 88.4 09/11/2023   PLT 238 09/11/2023   NEUTROABS 2.7 09/11/2023    ASSESSMENT & PLAN:  Malignant neoplasm of upper-outer quadrant of left breast in female, estrogen receptor negative (HCC) 04/30/2017: Left lumpectomy: IDC grade 3, 1.7 cm, DCIS, lymphovascular invasion present, margins negative, 0/1 lymph node negative, ER 0%, PR 0%, HER-2 negative ratio 1.37, Ki-67 40%, T1c N0 stage IB    Treatment summary: 1. adjuvant chemotherapy with dose dense Adriamycin and Cytoxan 4 followed by Taxol weekly 6 discontinued for neuropathy 2. Followed by radiation started 09/26/2017-10/25/2018  Breast cancer recurrence: mammogram 12/10/2022: Asymmetry/distortion left breast, ultrasound: 2 adjacent irregular masses 2 o'clock position left breast 1.9 cm and 1.2 cm, no suspicious lymph nodes Left breast biopsy 2:00 posterior: Grade 2 IDC with DCIS Left breast biopsy 2:00 anterior: Grade 2 IDC with DCIS ER 0%, PR 0%, HER2 0, Ki-67 30% 01/31/2023: Bone scan: Negative, CT CAP 01/26/2023: Negative for metastatic disease but nodular contour of the liver suggestive of cirrhosis 01/26/2023: Ultrasound left submandibular neck: Normal lymph node 1.2 cm 01/24/2023: MRI breast: The recently biopsied area measured 6.1 cm, skin thickening (post radiation)   Recommendation: 03/12/2023: Bilateral mastectomies: Right breast: Benign left mastectomy: Grade 3 IDC 6 cm with high-grade DCIS, margins negative, 1 intramammary lymph node negative, 0/2 sentinel lymph nodes, ER 0%, PR 0%, HER2 0, Ki-67 30% Followed by adjuvant chemotherapy with CMF x 6 cycles started  05/09/2023 ------------------------------------------------------------------------------------------------------------------------------------------------ Current treatment: Cycle 6 CMF   Chemo toxicities: Fatigue: Extremely severe Chemotherapy-induced anemia: Mild To moderate nausea: Takes antiemetics which appear to be helping her but she does not feel good overall. Neutropenia: We reduced the dosage of her chemotherapy Lymphedema left hand: Patient is working with physical therapy.       This concludes her chemotherapy.  We will remove her port. ------------------------------------- Assessment and Plan  Chemotherapy-Induced Diarrhea   Completed final chemotherapy session but continues to experience significant diarrhea, particularly on the second and third days post-treatment. Managed with Imodium, taking two tablets at onset and one additional tablet after each subsequent episode, totaling four to five tablets. Diarrhea subsides after initial days but leaves her feeling drained. Declined stronger anti-diarrheal medication.   - Continue current regimen of Imodium as needed   - Offer stronger anti-diarrheal medication if symptoms persist    Chemotherapy-Induced Fatigue   Persistent fatigue throughout chemotherapy treatment, likely multifactorial, including effects of chemotherapy and diarrhea.   - Encourage rest and gradual increase in physical activity as tolerated    Chemotherapy-Induced Peripheral Neuropathy   Experiences neuropathy in hands and feet, particularly affecting tips of fingers and outer parts of feet. Symptoms persisted throughout chemotherapy treatment.   - Monitor symptoms and consider referral to a neurologist if symptoms worsen    Hyperglycemia   Elevated blood sugar levels managed by primary care physician. Plan to remove steroid injection to help control blood sugar levels.   - Remove steroid injection   - Coordinate with primary care physician for further  management   - Administer insulin as needed during visits    Mild Anemia   Mild anemia showing improvement since last visit.   - Continue monitoring hemoglobin levels    Lymphedema   Ongoing lymphedema, fluctuates in severity. Currently managed with a therapist.   - Continue working with the therapist    Post-Chemotherapy Care   Completed chemotherapy treatment. Opted to keep port for ease of access, requiring flushing every two months. Follow-up care will include discussions on survivorship, next imaging, and further management.   - Schedule port flush every two months   - Follow-up with nurse practitioner in two months for survivorship care and next steps   - Plan for annual follow-up visits with the oncologist    Follow-up   - Follow-up in two months for port flush and survivorship care   - Annual follow-up visits with the oncologist.      No orders of the defined types were placed in this encounter.  The patient has a good understanding of the overall plan. she agrees with it. she will call with any problems that may develop before the next visit here. Total time spent: 30 mins including face to face time and time spent for planning, charting and co-ordination of care   Tamsen Meek, MD 09/11/23

## 2023-09-11 NOTE — Patient Instructions (Signed)
[facility] - A DEPT OF [facility]  Discharge Instructions: Thank you for choosing [facility] to provide your oncology and hematology care.   If you have a lab appointment with the Cancer Center, please go directly to the Cancer Center and check in at the registration area.   Wear comfortable clothing and clothing appropriate for easy access to any Portacath or PICC line.   We strive to give you quality time with your provider. You may need to reschedule your appointment if you arrive late (15 or more minutes).  Arriving late affects you and other patients whose appointments are after yours.  Also, if you miss three or more appointments without notifying the office, you may be dismissed from the clinic at the provider's discretion.      For prescription refill requests, have your pharmacy contact our office and allow 72 hours for refills to be completed.    Today you received the following chemotherapy and/or immunotherapy agents: Cyclophosphamide (Cytoxan), Methotrexate, and Fluorouracil.      To help prevent nausea and vomiting after your treatment, we encourage you to take your nausea medication as directed.  BELOW ARE SYMPTOMS THAT SHOULD BE REPORTED IMMEDIATELY: *FEVER GREATER THAN 100.4 F (38 C) OR HIGHER *CHILLS OR SWEATING *NAUSEA AND VOMITING THAT IS NOT CONTROLLED WITH YOUR NAUSEA MEDICATION *UNUSUAL SHORTNESS OF BREATH *UNUSUAL BRUISING OR BLEEDING *URINARY PROBLEMS (pain or burning when urinating, or frequent urination) *BOWEL PROBLEMS (unusual diarrhea, constipation, pain near the anus) TENDERNESS IN MOUTH AND THROAT WITH OR WITHOUT PRESENCE OF ULCERS (sore throat, sores in mouth, or a toothache) UNUSUAL RASH, SWELLING OR PAIN  UNUSUAL VAGINAL DISCHARGE OR ITCHING   Items with * indicate a potential emergency and should be followed up as soon as possible or go to the Emergency Department if any problems should occur.  Please  show the CHEMOTHERAPY ALERT CARD or IMMUNOTHERAPY ALERT CARD at check-in to the Emergency Department and triage nurse.  Should you have questions after your visit or need to cancel or reschedule your appointment, please contact [facility] - A DEPT OF [facility]  Dept: [phone]  and follow the prompts.  Office hours are 8:00 a.m. to 4:30 p.m. Monday - Friday. Please note that voicemails left after 4:00 p.m. may not be returned until the following business day.  We are closed weekends and major holidays. You have access to a nurse at all times for urgent questions. Please call the main number to the clinic Dept: [phone] and follow the prompts.   For any non-urgent questions, you may also contact your provider using MyChart. We now offer e-Visits for anyone 66 and older to request care online for non-urgent symptoms. For details visit [email or web address].   Also download the MyChart app! Go to the app store, search "MyChart", open the app, select [facility], and log in with your MyChart username and password.

## 2023-09-17 ENCOUNTER — Ambulatory Visit: Payer: Medicare Other | Attending: General Surgery

## 2023-09-17 DIAGNOSIS — M25612 Stiffness of left shoulder, not elsewhere classified: Secondary | ICD-10-CM | POA: Diagnosis not present

## 2023-09-17 DIAGNOSIS — Z171 Estrogen receptor negative status [ER-]: Secondary | ICD-10-CM | POA: Diagnosis not present

## 2023-09-17 DIAGNOSIS — C50412 Malignant neoplasm of upper-outer quadrant of left female breast: Secondary | ICD-10-CM | POA: Insufficient documentation

## 2023-09-17 DIAGNOSIS — M25611 Stiffness of right shoulder, not elsewhere classified: Secondary | ICD-10-CM | POA: Diagnosis not present

## 2023-09-17 DIAGNOSIS — Z9013 Acquired absence of bilateral breasts and nipples: Secondary | ICD-10-CM | POA: Insufficient documentation

## 2023-09-17 DIAGNOSIS — I890 Lymphedema, not elsewhere classified: Secondary | ICD-10-CM | POA: Diagnosis not present

## 2023-09-17 NOTE — Therapy (Signed)
 OUTPATIENT PHYSICAL THERAPY  UPPER EXTREMITY ONCOLOGY TREATMENT  Patient Name: Audrey Peters MRN: [id number] DOB:[birth year], 68 y.o., female Today's Date: 09/17/2023  END OF SESSION:  PT End of Session - 09/17/23 1014     Visit Number 19    Number of Visits 23    Date for PT Re-Evaluation 09/16/23    PT Start Time 1015   pt arrived late and had to use the BR upon arrival   PT Stop Time 1102    PT Time Calculation (min) 47 min    Activity Tolerance Patient tolerated treatment well    Behavior During Therapy WFL for tasks assessed/performed                 Past Medical History:  Diagnosis Date   Anemia yrs ago   Arthritis    Breast cancer (HCC)    Cancer (HCC)    recent dx in breast   Carpal tunnel syndrome of right wrist    Diabetes mellitus without complication (HCC)    dx 2008   Headache    sinus   Hypertension    Peripheral neuropathy 2024   Hands and Feet   Personal history of chemotherapy    Personal history of radiation therapy    Vaginal delivery 1983   Past Surgical History:  Procedure Laterality Date   BREAST BIOPSY     BREAST BIOPSY Left 01/01/2023   US  LT BREAST BX W LOC DEV 1ST LESION IMG BX SPEC US  GUIDE 01/01/2023 [facility]   BREAST BIOPSY Left 01/01/2023   US  LT BREAST BX W LOC DEV EA ADD LESION IMG BX SPEC US  GUIDE 01/01/2023 [facility]   BREAST LUMPECTOMY Left    BREAST LUMPECTOMY WITH RADIOACTIVE SEED AND SENTINEL LYMPH NODE BIOPSY Left 04/30/2017   Procedure: LEFT BREAST LUMPECTOMY WITH RADIOACTIVE SEED AND LEFT SENTINEL LYMPH NODE BIOPSY ERAS PATHWAY;  Surgeon: Vanderbilt Ned, MD;  Location: [facility];  Service: General;  Laterality: Left;   COLONOSCOPY WITH PROPOFOL  N/A 05/28/2016   Procedure: COLONOSCOPY WITH PROPOFOL ;  Surgeon: Gladis MARLA Louder, MD;  Location: [facility];  Service: Endoscopy;  Laterality: N/A;   DILATATION & CURETTAGE/HYSTEROSCOPY WITH MYOSURE N/A 04/20/2020   Procedure: DILATATION & CURETTAGE/HYSTEROSCOPY  WITH MYOSURE;  Surgeon: Ozan, Jennifer, DO;  Location: [facility];  Service: Gynecology;  Laterality: N/A;   DILATION AND CURETTAGE OF UTERUS     HYSTEROSCOPY WITH D & C N/A 07/21/2015   Procedure: DILATATION AND CURETTAGE /HYSTEROSCOPY with myosure;  Surgeon: Delon Prude, DO;  Location: [facility];  Service: Gynecology;  Laterality: N/A;   MASTECTOMY W/ SENTINEL NODE BIOPSY Left 03/12/2023   Procedure: LEFT MASTECTOMY WITH SENTINEL LYMPH NODE BIOPSY;  Surgeon: Aron Shoulders, MD;  Location: [facility];  Service: General;  Laterality: Left;   PORTACATH PLACEMENT Right 04/30/2017   Procedure: INSERTION PORT-A-CATH;  Surgeon: Vanderbilt Ned, MD;  Location: [facility];  Service: General;  Laterality: Right;   PORTACATH PLACEMENT N/A 03/12/2023   Procedure: PORT PLACEMENT WITH ULTRASOUND GUIDANCE;  Surgeon: Aron Shoulders, MD;  Location: [facility];  Service: General;  Laterality: N/A;   SIMPLE MASTECTOMY WITH AXILLARY SENTINEL NODE BIOPSY Right 03/12/2023   Procedure: RIGHT MASTECTOMY;  Surgeon: Aron Shoulders, MD;  Location: [facility];  Service: General;  Laterality: Right;   Patient Active Problem List   Diagnosis Date Noted   Recurrent breast cancer, left (HCC) 03/12/2023   Atrophy of vagina 09/12/2020   Bilateral lower  extremity edema 09/12/2020   History of ductal carcinoma in situ of breast 09/12/2020   Hyperglycemia due to type 2 diabetes mellitus (HCC) 09/12/2020   Knee pain 09/12/2020   Long term (current) use of insulin  (HCC) 09/12/2020   Lumbosacral spondylosis without myelopathy 09/12/2020   Mixed hyperlipidemia 09/12/2020   Obstructive sleep apnea syndrome 09/12/2020   Ovarian cyst 09/12/2020   Overweight 09/12/2020   Personal history of malignant neoplasm of breast 09/12/2020   Postmenopausal bleeding 09/12/2020   Pure hypercholesterolemia 09/12/2020   Sciatica 09/12/2020   Morbid obesity (HCC) 09/12/2020   Chemotherapy-induced  peripheral neuropathy (HCC) 01/22/2018   Encounter for antineoplastic chemotherapy 06/07/2017   Port-A-Cath in place 05/24/2017   Genetic testing 05/23/2017   Malignant neoplasm of upper-outer quadrant of left breast in female, estrogen receptor negative (HCC) 04/22/2017   Essential hypertension 01/30/2015   Diabetes mellitus (HCC) 01/30/2015    REFERRING PROVIDER: Dr. Aron    REFERRING DIAG:  Diagnosis  C50.412,Z17.1 (ICD-10-CM) - Malignant neoplasm of upper-outer quadrant of left breast in female, estrogen receptor negative (HCC)    THERAPY DIAG:  Malignant neoplasm of upper-outer quadrant of left breast in female, estrogen receptor negative (HCC)  Stiffness of left shoulder, not elsewhere classified  Status post mastectomy, bilateral  Stiffness of right shoulder, not elsewhere classified  Lymphedema, not elsewhere classified  ONSET DATE: 12/2022  Rationale for Evaluation and Treatment: Rehabilitation  SUBJECTIVE:                                                                                                                                                                                           SUBJECTIVE STATEMENT:  I had my last chemo 1/29 so I'm glad to have that behind me. I see the plastic surgeon on 09/19/23 to talk about my reconstruction options. Dr. Aron drained my seroma again on 1/28 and she said it was less than it's been. I feel like I've been managing my arm lymphedema well and I do think I'm ready to D/C.    PERTINENT HISTORY: New/recurrent breast cancer on the left. 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 Port placement and bil mastectomy 03/12/23 with removal of 2 negative nodes. Treatment in 2018 with lumpectomy, SLNB, chemo and XRT.  1 negative node removed in 2018.   aspirarted frorm Rt and 150 from Lt 04/16/23. Seroma catheters may be needed. Will be doing chemo again.    PAIN:  Are you having pain? NO    PRECAUTIONS: Lt lymphedema   RED  FLAGS: None   WEIGHT BEARING RESTRICTIONS: No  FALLS:  Has patient fallen in last 6 months? No  LIVING ENVIRONMENT: Lives with: lives with their family and lives with their spouse  OCCUPATION: Retired   LEISURE: nothing really,  get back to water aerobics.    HAND DOMINANCE: right   PRIOR LEVEL OF FUNCTION: Independent  PATIENT GOALS: get more movement back    OBJECTIVE:  COGNITION: Overall cognitive status: Within functional limits for tasks assessed   PALPATION: sloshing full chest bilateral chest wall with return of seroma - note sent to Dr. Aron   OBSERVATIONS / OTHER ASSESSMENTS: Left incision well healed - Rt incision has a small place covered with a band-aid that is still open and draining - white tissue present.   Lt back of hand and wrist appear larger with less visible landmarks  SENSATION: Numbness left tricep region.   POSTURE: rounded shoulders, forward head    UPPER EXTREMITY AROM/PROM:  A/PROM RIGHT   eval  RIGHT  05/07/2023  Shoulder extension 50   Shoulder flexion 115 - incision 168  Shoulder abduction 90 - pulls in incision 157 no pulling   Shoulder internal rotation    Shoulder external rotation 75     (Blank rows = not tested)  A/PROM LEFT   eval LEFT 05/07/2023 06/04/23 06/25/23 07/18/23  Shoulder extension 50      Shoulder flexion 117 - feels cording like arm pull but not visible 152 ( no pulling)  120 - feels tight upper arm / 145 post stretching  140 140 - pn axilla   Shoulder abduction 85 148 130 - pull in upper arm  150 150 - no pn  Shoulder internal rotation       Shoulder external rotation 75        (Blank rows = not tested)  UPPER EXTREMITY STRENGTH:   LYMPHEDEMA ASSESSMENTS:   LANDMARK RIGHT  eval  At axilla    15 cm proximal to olecranon process 39.7  10 cm proximal to olecranon process 38.7  Olecranon process 32.5  15 cm proximal to ulnar styloid process 27.3  10 cm proximal to ulnar styloid process 25.1  Just  proximal to ulnar styloid process 18  Across hand at thumb web space 20.5  At base of 2nd digit 6.1  (Blank rows = not tested)  [facility] LEFT  eval 06/25/23 07/09/23  07/16/23 07/30/23 08/27/23 09/17/23  At axilla          15 cm proximal to olecranon process 40.5 40.6 40 36.4 37.4 37.2 36.8  10 cm proximal to olecranon process 40.5 39.3 38.5 35.2 36.3 35.5 36.1  Olecranon process 36.5 35 35 28.7 28.9 28.4 29  15  cm proximal to ulnar styloid process 29.2 30.2 30 28.7 28 28.4 27.9  10 cm proximal to ulnar styloid process 25.8 26.9 27 26.2 25.2 25.6 25.2  Just proximal to ulnar styloid process 18.8 19.5 19 18.6 19.4 19.2 19.6  Across hand at thumb web space 20.3 22 22  20.6 21.8 20.7 20.7  At base of 2nd digit 5.8 6.3 6.5 6.2 6.1 6.5 6.3  (Blank rows = not tested) 10cm  Rt: 2981 Lt: 3395 difference  QUICK DASH SURVEY: 45%; 09/17/23 - 36.36   TODAY'S TREATMENT:  DATE:  09/17/23: Self Care Spent time at beginning of session assessing pts current functional status and how she's doing with self management of lymphedema symptoms. Also assessed progress towards goals for D/C. Manual Therapy Circumference measurements re taken MLD to Lt UE: Short neck, (noted pt had a Rt SLNB in July with mastectomy so instructed her to only use lateral anastomosis), superficial and deep abdominals, Lt inguinal nodes and Lt axillo-inguinal anastomosis, then Lt UE working proximal to distal, moving fluid from upper inner arm outwards, and doing both sides of forearm moving fluid, and dorsal wrist and hand then towards pathway spending extra time at dorsal hand then retracing all steps.   08/27/23: Manual Therapy Circumference measurements taken, reductions noted since last measured.  MLD to Lt UE: Short neck, (noted pt had a Rt SLNB in July with mastectomy so instructed her  to only use lateral anastomosis), superficial and deep abdominals, Lt inguinal nodes and Lt axillo-inguinal anastomosis, then Lt UE working proximal to distal, moving fluid from upper inner arm outwards, and doing both sides of forearm moving fluid, and dorsal wrist and hand then towards pathway spending extra time at dorsal hand then retracing all steps.  Assisted pt with velcro wrap and proper way to don; Only wearing hand wrap without anything on fingers today   08/05/2023 Pt has on velcro garment that she put in in a rush. Too low at wrist and hand wrap barely on. Manual Therapy MLD to Lt UE: Short neck, (noted pt had a Rt SLNB in July with mastectomy so instructed her to only use lateral anastomosis) Lt inguinal nodes and Lt axillo-inguinal anastomosis, then Lt UE working proximal to distal, moving fluid from upper inner arm outwards, and doing both sides of forearm moving fluid, and dorsal wrist and hand then towards pathway spending extra time in any areas of fibrosis then retracing all steps. Performed in sitting with pt sitting in chair and had pt practice  all steps. She required moderate VC's and TC's initially, but improved nicely with practice. Gave written handout with each step listed. Assisted pt with velcro wrap and proper way to don; Only wearing hand wrap without anything on fingers today and advised her fingers will swell without the glove  07/30/23: Therapeutic Exercises Pulleys into flex and abd x 2 mins each with VC's to remind pt to decrease Lt scapular compensation Roll yellow ball up wall into flex and Lt abd x 10 each Supine over half foam roll: Bil UE horz abd, then bil UE scaption into a V x 10 each, and ended with bil UE abd in a snow angel x 10, 5 sec holds returning therapist demo for each. Manual Therapy MLD to Lt UE: Short neck, (noted pt had a Rt SLNB in July with mastectomy so instructed her to only use lateral anastomosis) Lt inguinal nodes and Lt  axillo-inguinal anastomosis, then Lt UE working proximal to distal, moving fluid from upper inner arm outwards, and doing both sides of forearm moving fluid, and dorsal wrist and hand then towards pathway spending extra time in any areas of fibrosis then retracing all steps.  07/25/23: Pt arrives with velcro garment appropriately donned. Her dorsal hand was visibly swollen despite wearing her glove. When she reapplied it after going to the bathroom it was bunched at her wrist so instructed her to make sure she pulls this up over the velcro garment so as not to trap the fluid in her hand. She thinks she normally does this  but will pay more attention.  Therapeutic Exercises Pulleys into flex and abd x 2 mins each, tactile and VC's to remind pt to decrease scapular compensation, especially with abduction Roll yellow ball up wall into flex and Lt UE abd x 10 each Manual Therapy MLD to Lt UE: Short neck, Rt axillary nodes and establishment of anterior inter-axillary anastomosis, Lt inguinal nodes and Lt axillo-inguinal anastomosis, then L UE working proximal to distal, moving fluid from upper inner arm outwards, and doing both sides of forearm moving fluid, and dorsal wrist and hand then towards pathways spending extra time in any areas of fibrosis then retracing all steps.   07/23/23 Pt arrives wearing velcro and has it pulled up nicely Pulleys into flexion and abduction x each  Wall ball flexion x 5 Doorway stretch Lt only 10 x 3  Supine flexion dowel x 10 Supine chest stretch 3x30 MLD to Lt UE: Short neck, Rt axillary nodes and establishment of anterior inter-axillary anastomosis, Lt inguinal nodes and Lt axillo-inguinal anastomosis, then L UE working proximal to distal, moving fluid from upper inner arm outwards, and doing both sides of forearm moving fluid, and dorsal wrist and hand then towards pathways spending extra time in any areas of fibrosis then retracing all steps. PROM into  flexion  07/18/23: Manual Therapy Reassessed fit of garment and worked on getting the elbow crease in the right place.  We also had to look up instructions on why the hand wasn't fitting correctly.  It turns out that you were supposed to order the Rt hand to wear it black on the left hand so we discussed returing for the Rt hand vs wearing it inside out and we decided to wear it inside out for now.   Pulleys into flexion and abduction x each  Supine flexion dowel x 10 Supine chest stretch 3x30 Discussed TE and movement with compression being important for lymphatic movement  07/16/23: Manual Therapy Pt brought her new velcro garment so spent time instructing pt how to don properly. She tried this at beginning of session and then again at the end after MLD and was mostly independent with donning with very few VC's.  MLD to Lt UE: Short neck, superficial and deep abdominals, Rt axillary nodes and establishment of anterior inter-axillary anastomosis, Lt inguinal nodes and Lt axillo-inguinal anastomosis, then L UE working proximal to distal, moving fluid from upper inner arm outwards, and doing both sides of forearm moving fluid, and dorsal wrist and hand then towards pathways spending extra time in any areas of fibrosis then retracing all steps.    PATIENT EDUCATION:  Education details: per today's note Person educated: Patient Education method: Chief Technology Officer Education comprehension: verbalized understanding, returned demonstration, and needs further education  HOME EXERCISE PROGRAM: Post op breast with wall flexion as an option instead of hands clasped  Self MLD Use of sleeve  ASSESSMENT:  CLINICAL IMPRESSION: Pt has done excellent with this episode of care. She is independent with managing her lymphedema symptoms at this time and has met all goals except for Quick DASH but there was progress towards this. She is ready for D/C. Reviewed with pt importance of staying  compliant with wear of compression garment and performing self MLD daily to help her to better manage her chronic symptoms. However, she knows that if she ever needs assistance with managing her symptoms again she can return to this clinic with a new MD referral.   OBJECTIVE IMPAIRMENTS: decreased activity tolerance,  decreased knowledge of condition, decreased knowledge of use of DME, decreased ROM, decreased strength, and pain.   ACTIVITY LIMITATIONS: carrying, lifting, and reach over head  PARTICIPATION LIMITATIONS: cleaning, laundry, and community activity  PERSONAL FACTORS: Time since onset of injury/illness/exacerbation and 3+ comorbidities: radiation hx to axilla, SLNB, disease recurrence.    are also affecting patient's functional outcome.   REHAB POTENTIAL: Good  CLINICAL DECISION MAKING: Evolving/moderate complexity  EVALUATION COMPLEXITY: Moderate  GOALS: Goals reviewed with patient? Yes  SHORT TERM GOALS: Target date: 05/07/23  Pt will be educated on lymphedema - physiology, healthy habits, importance of treatment, and treatment options  Baseline: Goal status: MET   LONG TERM GOALS: Target date: 07/10/23  Pt will obtain compression sleeve and glove for use during treatment with education on use Baseline:  Goal status: MET  2.  Pt will improve bil shoulder reach to [facility] to allow for reach into the cabinets Baseline:  Goal status: MET  3.  Pt will be ind with self MLD for the Lt UE  Baseline: 09/17/23 - pt reports independent with this now Goal status: MET  4.  Pt will decrease QDASH to 15% or less to demonstrate improved mobility in the UE Baseline:  08/05/2023 - 38.64; 09/17/23 - 36.36 Goal status: PARTIALLY MET  5.  Pt will be ind with final HEP Baseline:  Goal status: MET   PLAN:  PT FREQUENCY:  1x-2x/week, or 6 visits  PT DURATION: 6 weeks   PLANNED INTERVENTIONS: Therapeutic exercises, Neuromuscular re-education, Patient/Family education, Self Care, DME  instructions, Manual therapy, and Re-evaluation  PLAN FOR NEXT SESSION:  D/C this visit.   Added: 05/01/23: no coverage at [facility], faxed order to [facility] due to [employer] [place] 05/06/23: from [facility]: she would have to pay OOP but then could get reimbursed by her [employer]? Will let know if pt would like to do this 05/14/23: called [facility] and no coverage is guaranteed so she will not order there.  PT will check and send 1 more place before pt self orders.  She has ordering info.  05/16/23 - entered into [facility] portal 06/04/23; has processed  06/25/23 - entered velcro garments into [facility]    Aden Berwyn Caldron, PTA 09/17/2023, 1:21 PM  [facility] [address] [place], [place], [address] Phone: [phone]   Fax:  [phone]

## 2023-09-19 DIAGNOSIS — Z923 Personal history of irradiation: Secondary | ICD-10-CM | POA: Diagnosis not present

## 2023-09-19 DIAGNOSIS — Z171 Estrogen receptor negative status [ER-]: Secondary | ICD-10-CM | POA: Diagnosis not present

## 2023-09-19 DIAGNOSIS — C50412 Malignant neoplasm of upper-outer quadrant of left female breast: Secondary | ICD-10-CM | POA: Diagnosis not present

## 2023-10-01 DIAGNOSIS — Z171 Estrogen receptor negative status [ER-]: Secondary | ICD-10-CM | POA: Diagnosis not present

## 2023-10-01 DIAGNOSIS — L7634 Postprocedural seroma of skin and subcutaneous tissue following other procedure: Secondary | ICD-10-CM | POA: Diagnosis not present

## 2023-10-01 DIAGNOSIS — C50412 Malignant neoplasm of upper-outer quadrant of left female breast: Secondary | ICD-10-CM | POA: Diagnosis not present

## 2023-10-10 DIAGNOSIS — E7800 Pure hypercholesterolemia, unspecified: Secondary | ICD-10-CM | POA: Diagnosis not present

## 2023-10-10 DIAGNOSIS — I10 Essential (primary) hypertension: Secondary | ICD-10-CM | POA: Diagnosis not present

## 2023-10-10 DIAGNOSIS — E1169 Type 2 diabetes mellitus with other specified complication: Secondary | ICD-10-CM | POA: Diagnosis not present

## 2023-10-10 DIAGNOSIS — Z0000 Encounter for general adult medical examination without abnormal findings: Secondary | ICD-10-CM | POA: Diagnosis not present

## 2023-10-10 DIAGNOSIS — E1142 Type 2 diabetes mellitus with diabetic polyneuropathy: Secondary | ICD-10-CM | POA: Diagnosis not present

## 2023-10-23 DIAGNOSIS — C50412 Malignant neoplasm of upper-outer quadrant of left female breast: Secondary | ICD-10-CM | POA: Diagnosis not present

## 2023-10-23 DIAGNOSIS — N6489 Other specified disorders of breast: Secondary | ICD-10-CM | POA: Diagnosis not present

## 2023-10-23 DIAGNOSIS — Z171 Estrogen receptor negative status [ER-]: Secondary | ICD-10-CM | POA: Diagnosis not present

## 2023-11-08 ENCOUNTER — Other Ambulatory Visit: Payer: Self-pay | Admitting: *Deleted

## 2023-11-08 DIAGNOSIS — Z171 Estrogen receptor negative status [ER-]: Secondary | ICD-10-CM

## 2023-11-11 ENCOUNTER — Inpatient Hospital Stay: Payer: Medicare Other

## 2023-11-11 ENCOUNTER — Inpatient Hospital Stay: Payer: Medicare Other | Attending: Hematology and Oncology | Admitting: Adult Health

## 2023-11-11 ENCOUNTER — Other Ambulatory Visit: Payer: Self-pay | Admitting: *Deleted

## 2023-11-11 VITALS — BP 137/73 | HR 75 | Temp 97.9°F | Resp 16 | Wt 224.4 lb

## 2023-11-11 DIAGNOSIS — Z9221 Personal history of antineoplastic chemotherapy: Secondary | ICD-10-CM | POA: Diagnosis not present

## 2023-11-11 DIAGNOSIS — Z171 Estrogen receptor negative status [ER-]: Secondary | ICD-10-CM

## 2023-11-11 DIAGNOSIS — I890 Lymphedema, not elsewhere classified: Secondary | ICD-10-CM | POA: Diagnosis not present

## 2023-11-11 DIAGNOSIS — Z9013 Acquired absence of bilateral breasts and nipples: Secondary | ICD-10-CM | POA: Diagnosis not present

## 2023-11-11 DIAGNOSIS — C50912 Malignant neoplasm of unspecified site of left female breast: Secondary | ICD-10-CM | POA: Diagnosis not present

## 2023-11-11 DIAGNOSIS — Z853 Personal history of malignant neoplasm of breast: Secondary | ICD-10-CM | POA: Diagnosis not present

## 2023-11-11 DIAGNOSIS — C50412 Malignant neoplasm of upper-outer quadrant of left female breast: Secondary | ICD-10-CM

## 2023-11-11 LAB — CBC WITH DIFFERENTIAL (CANCER CENTER ONLY)
Abs Immature Granulocytes: 0.02 10*3/uL (ref 0.00–0.07)
Basophils Absolute: 0 10*3/uL (ref 0.0–0.1)
Basophils Relative: 0 %
Eosinophils Absolute: 0.1 10*3/uL (ref 0.0–0.5)
Eosinophils Relative: 2 %
HCT: 37 % (ref 36.0–46.0)
Hemoglobin: 11.5 g/dL — ABNORMAL LOW (ref 12.0–15.0)
Immature Granulocytes: 0 %
Lymphocytes Relative: 33 %
Lymphs Abs: 1.8 10*3/uL (ref 0.7–4.0)
MCH: 28.3 pg (ref 26.0–34.0)
MCHC: 31.1 g/dL (ref 30.0–36.0)
MCV: 90.9 fL (ref 80.0–100.0)
Monocytes Absolute: 0.5 10*3/uL (ref 0.1–1.0)
Monocytes Relative: 9 %
Neutro Abs: 3 10*3/uL (ref 1.7–7.7)
Neutrophils Relative %: 56 %
Platelet Count: 146 10*3/uL — ABNORMAL LOW (ref 150–400)
RBC: 4.07 MIL/uL (ref 3.87–5.11)
RDW: 13.8 % (ref 11.5–15.5)
WBC Count: 5.4 10*3/uL (ref 4.0–10.5)
nRBC: 0 % (ref 0.0–0.2)

## 2023-11-11 LAB — CMP (CANCER CENTER ONLY)
ALT: 33 U/L (ref 0–44)
AST: 54 U/L — ABNORMAL HIGH (ref 15–41)
Albumin: 4 g/dL (ref 3.5–5.0)
Alkaline Phosphatase: 62 U/L (ref 38–126)
Anion gap: 8 (ref 5–15)
BUN: 23 mg/dL (ref 8–23)
CO2: 26 mmol/L (ref 22–32)
Calcium: 9.3 mg/dL (ref 8.9–10.3)
Chloride: 104 mmol/L (ref 98–111)
Creatinine: 0.9 mg/dL (ref 0.44–1.00)
GFR, Estimated: >60 mL/min (ref >60)
Glucose, Bld: 218 mg/dL — ABNORMAL HIGH (ref 70–99)
Potassium: 4.1 mmol/L (ref 3.5–5.1)
Sodium: 138 mmol/L (ref 135–145)
Total Bilirubin: 0.5 mg/dL (ref 0.0–1.2)
Total Protein: 7.3 g/dL (ref 6.5–8.1)

## 2023-11-11 NOTE — Progress Notes (Unsigned)
 SURVIVORSHIP VISIT:  BRIEF ONCOLOGIC HISTORY:  Oncology History  Malignant neoplasm of upper-outer quadrant of left breast in female, estrogen receptor negative (HCC)  04/05/2017 Initial Diagnosis   Left breast asymmetry by ultrasound measured 1.3 cm at 2:30 position 10 cm from nipple, no axillary lymph nodes; biopsy IDC grade 2, ER 0%, PR 0%, HER-2 negative ratio 1.37, Ki-67 40%, T1c N0 stage IB AJCC 8    04/30/2017 Surgery   Left lumpectomy: IDC grade 3, 1.7 cm, DCIS, lymphovascular invasion present, margins negative, 0/1 lymph node negative, ER 0%, PR 0%, HER-2 negative ratio 1.37, Ki-67 40%, T1c N0 stage IB   05/22/2017 Genetic Testing   Patient had genetic testing due to a personal history of triple negative breast cancer.  The Common Hereditary Cancer Panel was ordered. The Hereditary Gene Panel offered by [facility] includes sequencing and/or deletion duplication testing of the following 46 genes: APC, ATM, AXIN2, BARD1, BMPR1A, BRCA1, BRCA2, BRIP1, CDH1, CDKN2A (p14ARF), CDKN2A (p16INK4a), CHEK2, CTNNA1, DICER1, EPCAM (Deletion/duplication testing only), GREM1 (promoter region deletion/duplication testing only), KIT, MEN1, MLH1, MSH2, MSH3, MSH6, MUTYH, NBN, NF1, NHTL1, PALB2, PDGFRA, PMS2, POLD1, POLE, PTEN, RAD50, RAD51C, RAD51D, SDHB, SDHC, SDHD, SMAD4, SMARCA4. STK11, TP53, TSC1, TSC2, and VHL.  The following genes were evaluated for sequence changes only: SDHA and HOXB13 c.251G>A variant only.    Results: No pathogenic mutations identified.  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  The date of this test report is 05/22/2017.    05/24/2017 - 08/30/2017 Chemotherapy   Dose dense Adriamycin and Cytoxan 4 followed by Taxol weekly 5 (stopped early for neuropathy)    09/26/2017 - 10/22/2017 Radiation Therapy   Adjuvant radiation therapy   12/10/2022 Relapse/Recurrence   Mammogram detected distortion, 2 irregular masses 1.9 cm and 1.2 cm: Biopsy grade 2 IDC with DCIS triple  negative, scans negative, MRI breast: 6.1 cm   03/12/2023 Surgery   Bilateral mastectomies: Right breast: Benign  left mastectomy: Grade 3 IDC 6 cm with high-grade DCIS, margins negative, 1 intramammary lymph node negative, 0/2 sentinel lymph nodes, ER 0%, PR 0%, HER2 0, Ki-67 30%   05/09/2023 -  Chemotherapy   Patient is on Treatment Plan : BREAST Adjuvant CMF IV q21d       INTERVAL HISTORY:  Audrey Peters to review her survivorship care plan detailing her treatment course for breast cancer, as well as monitoring long-term side effects of that treatment, education regarding health maintenance, screening, and overall wellness and health promotion.     Overall, Audrey Peters reports feeling quite well.  Her goal is to lose weight this year.  She is working with PT on her left arm lymphedema.    REVIEW OF SYSTEMS:  Review of Systems  Constitutional:  Negative for appetite change, chills, fatigue, fever and unexpected weight change.  HENT:   Negative for hearing loss, lump/mass and trouble swallowing.   Eyes:  Negative for eye problems and icterus.  Respiratory:  Negative for chest tightness, cough and shortness of breath.   Cardiovascular:  Negative for chest pain, leg swelling and palpitations.  Gastrointestinal:  Negative for abdominal distention, abdominal pain, constipation, diarrhea, nausea and vomiting.  Endocrine: Negative for hot flashes.  Genitourinary:  Negative for difficulty urinating.   Musculoskeletal:  Negative for arthralgias.  Skin:  Negative for itching and rash.  Neurological:  Negative for dizziness, extremity weakness, headaches and numbness.  Hematological:  Negative for adenopathy. Does not bruise/bleed easily.  Psychiatric/Behavioral:  Negative for depression. The patient is not  nervous/anxious.   Breast: Denies any new nodularity, masses, tenderness, nipple changes, or nipple discharge.       PAST MEDICAL/SURGICAL HISTORY:  Past Medical History:  Diagnosis Date    Anemia yrs ago   Arthritis    Breast cancer (HCC)    Cancer (HCC)    recent dx in breast   Carpal tunnel syndrome of right wrist    Diabetes mellitus without complication (HCC)    dx 2008   Headache    sinus   Hypertension    Peripheral neuropathy 2024   Hands and Feet   Personal history of chemotherapy    Personal history of radiation therapy    Vaginal delivery 1983   Past Surgical History:  Procedure Laterality Date   BREAST BIOPSY     BREAST BIOPSY Left 01/01/2023   [place] LT BREAST BX W LOC DEV 1ST LESION IMG BX SPEC US GUIDE 01/01/2023 [facility]   BREAST BIOPSY Left 01/01/2023   [place] LT BREAST BX W LOC DEV EA ADD LESION IMG BX SPEC US GUIDE 01/01/2023 [facility]   BREAST LUMPECTOMY Left    BREAST LUMPECTOMY WITH RADIOACTIVE SEED AND SENTINEL LYMPH NODE BIOPSY Left 04/30/2017   Procedure: LEFT BREAST LUMPECTOMY WITH RADIOACTIVE SEED AND LEFT SENTINEL LYMPH NODE BIOPSY ERAS PATHWAY;  Surgeon: Harriette Bouillon, MD;  Location: [facility];  Service: General;  Laterality: Left;   COLONOSCOPY WITH PROPOFOL N/A 05/28/2016   Procedure: COLONOSCOPY WITH PROPOFOL;  Surgeon: Charolett Bumpers, MD;  Location: [facility];  Service: Endoscopy;  Laterality: N/A;   DILATATION & CURETTAGE/HYSTEROSCOPY WITH MYOSURE N/A 04/20/2020   Procedure: DILATATION & CURETTAGE/HYSTEROSCOPY WITH MYOSURE;  Surgeon: Myna Hidalgo, DO;  Location: [facility];  Service: Gynecology;  Laterality: N/A;   DILATION AND CURETTAGE OF UTERUS     HYSTEROSCOPY WITH D & C N/A 07/21/2015   Procedure: DILATATION AND CURETTAGE /HYSTEROSCOPY with myosure;  Surgeon: Myna Hidalgo, DO;  Location: [facility];  Service: Gynecology;  Laterality: N/A;   MASTECTOMY W/ SENTINEL NODE BIOPSY Left 03/12/2023   Procedure: LEFT MASTECTOMY WITH SENTINEL LYMPH NODE BIOPSY;  Surgeon: Almond Lint, MD;  Location: [facility];  Service: General;  Laterality: Left;   PORTACATH PLACEMENT Right 04/30/2017   Procedure:  INSERTION PORT-A-CATH;  Surgeon: Harriette Bouillon, MD;  Location: [facility];  Service: General;  Laterality: Right;   PORTACATH PLACEMENT N/A 03/12/2023   Procedure: PORT PLACEMENT WITH ULTRASOUND GUIDANCE;  Surgeon: Almond Lint, MD;  Location: [facility];  Service: General;  Laterality: N/A;   SIMPLE MASTECTOMY WITH AXILLARY SENTINEL NODE BIOPSY Right 03/12/2023   Procedure: RIGHT MASTECTOMY;  Surgeon: Almond Lint, MD;  Location: [facility];  Service: General;  Laterality: Right;     ALLERGIES:  Allergies  Allergen Reactions   Canagliflozin Other (See Comments)    Caused a yeast infection Other reaction(s): yeast infections   Empagliflozin     Other reaction(s): yeast infections   Other     Other reaction(s): palpitation Other reaction(s): Unknown   Sulfa Antibiotics Itching and Other (See Comments)    Unsure of exact reaction type   Ciprofloxacin Palpitations     CURRENT MEDICATIONS:  Outpatient Encounter Medications as of 11/11/2023  Medication Sig   acetaminophen (TYLENOL) 650 MG CR tablet Take 650 mg by mouth every 8 (eight) hours as needed for pain.   atorvastatin (LIPITOR) 10 MG tablet 1 tablet   clotrimazole-betamethasone (LOTRISONE) cream    Continuous Blood Gluc Sensor (  FREESTYLE LIBRE 2 SENSOR) MISC .   DULoxetine (CYMBALTA) 30 MG capsule Take 30 mg by mouth daily.   gabapentin (NEURONTIN) 100 MG capsule Take 100 mg by mouth 3 (three) times daily.   glucose blood (ONETOUCH VERIO) test strip use to check blood sugar   glucose blood test strip by miscellaneous route.   Insulin Pen Needle (B-D ULTRAFINE III SHORT PEN) 31G X 8 MM MISC 3 (three) times daily.   insulin regular human CONCENTRATED (HUMULIN R U-500 KWIKPEN) 500 UNIT/ML kwikpen 150 units before breakfast, 80 units before lunch, 80 units before evening meal   lidocaine-prilocaine (EMLA) cream Apply to affected area once   losartan (COZAAR) 100 MG tablet Take 100 mg by mouth daily.    metFORMIN (GLUCOPHAGE-XR) 500 MG 24 hr tablet 2 tablets with evening meal   methocarbamol (ROBAXIN) 500 MG tablet Take 1 tablet (500 mg total) by mouth every 6 (six) hours as needed for muscle spasms.   ondansetron (ZOFRAN) 8 MG tablet Take 1 tablet (8 mg total) by mouth every 8 (eight) hours as needed for nausea or vomiting. Start on the third day after chemotherapy.   ONETOUCH VERIO test strip 2 (two) times daily.   oxyCODONE (OXY IR/ROXICODONE) 5 MG immediate release tablet Take 1 tablet (5 mg total) by mouth every 4 (four) hours as needed for moderate pain.   pregabalin (LYRICA) 150 MG capsule 1 capsule   prochlorperazine (COMPAZINE) 10 MG tablet Take 1 tablet (10 mg total) by mouth every 6 (six) hours as needed for nausea or vomiting.   Semaglutide,0.25 or 0.5MG /DOS, 2 MG/1.5ML SOPN Inject 0.25 mg into the skin once a week.   senna (SENOKOT) 8.6 MG TABS tablet Take 1 tablet (8.6 mg total) by mouth 2 (two) times daily.   triamterene-hydrochlorothiazide (MAXZIDE-25) 37.5-25 MG tablet Take 1 tablet by mouth every morning.   No facility-administered encounter medications on file as of 11/11/2023.     ONCOLOGIC FAMILY HISTORY:  Family History  Problem Relation Age of Onset   Diabetes Mother    Hypertension Mother      SOCIAL HISTORY:  Social History   Socioeconomic History   Marital status: Married    Spouse name: Not on file   Number of children: Not on file   Years of education: Not on file   Highest education level: Not on file  Occupational History   Not on file  Tobacco Use   Smoking status: Never   Smokeless tobacco: Never  Vaping Use   Vaping status: Never Used  Substance and Sexual Activity   Alcohol use: Yes    Alcohol/week: 0.0 standard drinks of alcohol    Comment: occ wine   Drug use: No   Sexual activity: Not on file  Other Topics Concern   Not on file  Social History Narrative   Not on file   Social Drivers of Health   Financial Resource Strain: Not on  file  Food Insecurity: Not on file  Transportation Needs: Not on file  Physical Activity: Not on file  Stress: Not on file  Social Connections: Not on file  Intimate Partner Violence: Not on file     OBSERVATIONS/OBJECTIVE:  BP 137/73 (BP Location: Right Arm, Patient Position: Sitting)   Pulse 75   Temp 97.9 F (36.6 C) (Temporal)   Resp 16   Wt 224 lb 6.4 oz (101.8 kg)   SpO2 98%   BMI 38.52 kg/m  GENERAL: Patient is a well appearing female in no  acute distress HEENT:  Sclerae anicteric.  Oropharynx clear and moist. No ulcerations or evidence of oropharyngeal candidiasis. Neck is supple.  NODES:  No cervical, supraclavicular, or axillary lymphadenopathy palpated.  BREAST EXAM: Status post bilateral mastectomies, no sign of local recurrence.  No sign of draining at left mastectomy site or open wound.  The site appears well-healed. LUNGS:  Clear to auscultation bilaterally.  No wheezes or rhonchi. HEART:  Regular rate and rhythm. No murmur appreciated. ABDOMEN:  Soft, nontender.  Positive, normoactive bowel sounds. No organomegaly palpated. MSK:  No focal spinal tenderness to palpation. Full range of motion bilaterally in the upper extremities. EXTREMITIES:  No peripheral edema.   SKIN:  Clear with no obvious rashes or skin changes. No nail dyscrasia. NEURO:  Nonfocal. Well oriented.  Appropriate affect.   LABORATORY DATA:  None for this visit.  DIAGNOSTIC IMAGING:  None for this visit.      ASSESSMENT AND PLAN:  Ms.. Peters is a pleasant 68 y.o. 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  She presents to the Survivorship Clinic for our initial meeting and routine follow-up post-completion of treatment for breast cancer.    1. Stage IIIA left breast cancer:  Audrey Peters is continuing to recover from definitive treatment for breast cancer. She will  follow-up with her medical oncologist, Dr.  Pamelia Hoit in 3 months with history and physical exam per surveillance protocol.  She will undergo guardant reveal circulating tumor DNA testing and will begin testing in 4 weeks..   Today, a comprehensive survivorship care plan and treatment summary was reviewed with the patient today detailing her breast cancer diagnosis, treatment course, potential late/long-term effects of treatment, appropriate follow-up care with recommendations for the future, and patient education resources.  A copy of this summary, along with a letter will be sent to the patient's primary care provider via mail/fax/In Basket message after today's visit.    2. Bone health:  She was given education on specific activities to promote bone health.  3. Cancer screening:  Due to Audrey Peters history and her age, she should receive screening for skin cancers, colon cancer, and gynecologic cancers.  The information and recommendations are listed on the patient's comprehensive care plan/treatment summary and were reviewed in detail with the patient.    4. Health maintenance and wellness promotion: Audrey Peters was encouraged to consume 5-7 servings of fruits and vegetables per day. We reviewed the "Nutrition Rainbow" handout.  She was also encouraged to engage in moderate to vigorous exercise for 30 minutes per day most days of the week.  She was instructed to limit her alcohol consumption and continue to abstain from tobacco use.     5. Support services/counseling: It is not uncommon for this period of the patient's cancer care trajectory to be one of many emotions and stressors.   She was given information regarding our available services and encouraged to contact me with any questions or for help enrolling in any of our support group/programs.    Follow up instructions:    -Return to cancer center in 4 weeks for guardant reveal testing and port flush -Follow-up with Dr. Pamelia Hoit in 3 months with  port flush prior -She is welcome to return back to the Survivorship Clinic at any time; no additional follow-up needed at this time.  -Consider referral back to survivorship as a long-term survivor for continued surveillance  The patient was provided  an opportunity to ask questions and all were answered. The patient agreed with the plan and demonstrated an understanding of the instructions.   Total encounter time:40 minutes*in face-to-face visit time, chart review, lab review, care coordination, order entry, and documentation of the encounter time.    Lillard Anes, NP 11/11/23 11:14 AM Medical Oncology and Hematology [facility] [address] [place], [place] [address] Tel. [phone]    Fax. [phone]  *Total Encounter Time as defined by the Centers for Medicare and Medicaid Services includes, in addition to the face-to-face time of a patient visit (documented in the note above) non-face-to-face time: obtaining and reviewing outside history, ordering and reviewing medications, tests or procedures, care coordination (communications with other health care professionals or caregivers) and documentation in the medical record.

## 2023-11-12 ENCOUNTER — Other Ambulatory Visit: Payer: Self-pay

## 2023-11-12 ENCOUNTER — Encounter: Payer: Self-pay | Admitting: Adult Health

## 2023-11-12 ENCOUNTER — Encounter: Payer: Self-pay | Admitting: Hematology and Oncology

## 2023-11-18 DIAGNOSIS — C50412 Malignant neoplasm of upper-outer quadrant of left female breast: Secondary | ICD-10-CM | POA: Diagnosis not present

## 2023-11-18 DIAGNOSIS — Z171 Estrogen receptor negative status [ER-]: Secondary | ICD-10-CM | POA: Diagnosis not present

## 2023-11-18 DIAGNOSIS — Z853 Personal history of malignant neoplasm of breast: Secondary | ICD-10-CM | POA: Diagnosis not present

## 2023-11-18 DIAGNOSIS — N6489 Other specified disorders of breast: Secondary | ICD-10-CM | POA: Diagnosis not present

## 2023-11-25 ENCOUNTER — Ambulatory Visit: Payer: BC Managed Care – PPO | Admitting: Hematology and Oncology

## 2023-12-09 ENCOUNTER — Inpatient Hospital Stay: Attending: Hematology and Oncology

## 2023-12-09 DIAGNOSIS — Z9013 Acquired absence of bilateral breasts and nipples: Secondary | ICD-10-CM | POA: Diagnosis not present

## 2023-12-09 DIAGNOSIS — Z171 Estrogen receptor negative status [ER-]: Secondary | ICD-10-CM | POA: Insufficient documentation

## 2023-12-09 DIAGNOSIS — Z95828 Presence of other vascular implants and grafts: Secondary | ICD-10-CM

## 2023-12-09 DIAGNOSIS — Z853 Personal history of malignant neoplasm of breast: Secondary | ICD-10-CM | POA: Diagnosis not present

## 2023-12-09 DIAGNOSIS — C50412 Malignant neoplasm of upper-outer quadrant of left female breast: Secondary | ICD-10-CM | POA: Diagnosis not present

## 2023-12-09 DIAGNOSIS — N6489 Other specified disorders of breast: Secondary | ICD-10-CM | POA: Diagnosis not present

## 2023-12-09 LAB — CBC WITH DIFFERENTIAL (CANCER CENTER ONLY)
Abs Immature Granulocytes: 0.01 10*3/uL (ref 0.00–0.07)
Basophils Absolute: 0 10*3/uL (ref 0.0–0.1)
Basophils Relative: 0 %
Eosinophils Absolute: 0 10*3/uL (ref 0.0–0.5)
Eosinophils Relative: 1 %
HCT: 35.8 % — ABNORMAL LOW (ref 36.0–46.0)
Hemoglobin: 11.5 g/dL — ABNORMAL LOW (ref 12.0–15.0)
Immature Granulocytes: 0 %
Lymphocytes Relative: 20 %
Lymphs Abs: 0.9 10*3/uL (ref 0.7–4.0)
MCH: 28.2 pg (ref 26.0–34.0)
MCHC: 32.1 g/dL (ref 30.0–36.0)
MCV: 87.7 fL (ref 80.0–100.0)
Monocytes Absolute: 0.4 10*3/uL (ref 0.1–1.0)
Monocytes Relative: 9 %
Neutro Abs: 3.2 10*3/uL (ref 1.7–7.7)
Neutrophils Relative %: 70 %
Platelet Count: 139 10*3/uL — ABNORMAL LOW (ref 150–400)
RBC: 4.08 MIL/uL (ref 3.87–5.11)
RDW: 13.4 % (ref 11.5–15.5)
WBC Count: 4.6 10*3/uL (ref 4.0–10.5)
nRBC: 0 % (ref 0.0–0.2)

## 2023-12-09 LAB — CMP (CANCER CENTER ONLY)
ALT: 32 U/L (ref 0–44)
AST: 48 U/L — ABNORMAL HIGH (ref 15–41)
Albumin: 3.9 g/dL (ref 3.5–5.0)
Alkaline Phosphatase: 65 U/L (ref 38–126)
Anion gap: 5 (ref 5–15)
BUN: 18 mg/dL (ref 8–23)
CO2: 28 mmol/L (ref 22–32)
Calcium: 9.1 mg/dL (ref 8.9–10.3)
Chloride: 105 mmol/L (ref 98–111)
Creatinine: 0.84 mg/dL (ref 0.44–1.00)
GFR, Estimated: >60 mL/min (ref >60)
Glucose, Bld: 153 mg/dL — ABNORMAL HIGH (ref 70–99)
Potassium: 3.9 mmol/L (ref 3.5–5.1)
Sodium: 138 mmol/L (ref 135–145)
Total Bilirubin: 0.6 mg/dL (ref 0.0–1.2)
Total Protein: 7.1 g/dL (ref 6.5–8.1)

## 2023-12-09 MED ORDER — HEPARIN SOD (PORK) LOCK FLUSH 100 UNIT/ML IV SOLN
500.0000 [IU] | Freq: Once | INTRAVENOUS | Status: AC | PRN
  Administered 2023-12-09: 500 [IU] via INTRAVENOUS

## 2023-12-09 MED ORDER — SODIUM CHLORIDE 0.9% FLUSH
10.0000 mL | INTRAVENOUS | Status: DC | PRN
  Administered 2023-12-09: 10 mL via INTRAVENOUS

## 2023-12-15 DIAGNOSIS — C50412 Malignant neoplasm of upper-outer quadrant of left female breast: Secondary | ICD-10-CM | POA: Diagnosis not present

## 2023-12-20 ENCOUNTER — Telehealth: Payer: Self-pay | Admitting: *Deleted

## 2023-12-20 NOTE — Telephone Encounter (Signed)
 Called pt to make aware of negative/not detected Gaurdant Reveal results. Advised to f/u as scheduled. Pt verbalized understanding.

## 2023-12-24 ENCOUNTER — Encounter: Payer: Self-pay | Admitting: Adult Health

## 2023-12-25 LAB — GUARDANT REVEAL

## 2023-12-31 DIAGNOSIS — C50412 Malignant neoplasm of upper-outer quadrant of left female breast: Secondary | ICD-10-CM | POA: Diagnosis not present

## 2023-12-31 DIAGNOSIS — Z171 Estrogen receptor negative status [ER-]: Secondary | ICD-10-CM | POA: Diagnosis not present

## 2023-12-31 DIAGNOSIS — N6489 Other specified disorders of breast: Secondary | ICD-10-CM | POA: Diagnosis not present

## 2024-01-14 DIAGNOSIS — I10 Essential (primary) hypertension: Secondary | ICD-10-CM | POA: Diagnosis not present

## 2024-01-14 DIAGNOSIS — E1169 Type 2 diabetes mellitus with other specified complication: Secondary | ICD-10-CM | POA: Diagnosis not present

## 2024-01-14 DIAGNOSIS — E1140 Type 2 diabetes mellitus with diabetic neuropathy, unspecified: Secondary | ICD-10-CM | POA: Diagnosis not present

## 2024-01-14 DIAGNOSIS — E1142 Type 2 diabetes mellitus with diabetic polyneuropathy: Secondary | ICD-10-CM | POA: Diagnosis not present

## 2024-01-20 DIAGNOSIS — Z171 Estrogen receptor negative status [ER-]: Secondary | ICD-10-CM | POA: Diagnosis not present

## 2024-01-20 DIAGNOSIS — C50412 Malignant neoplasm of upper-outer quadrant of left female breast: Secondary | ICD-10-CM | POA: Diagnosis not present

## 2024-01-20 DIAGNOSIS — N6489 Other specified disorders of breast: Secondary | ICD-10-CM | POA: Diagnosis not present

## 2024-02-07 ENCOUNTER — Other Ambulatory Visit: Payer: Self-pay

## 2024-02-07 DIAGNOSIS — C50412 Malignant neoplasm of upper-outer quadrant of left female breast: Secondary | ICD-10-CM

## 2024-02-09 NOTE — Assessment & Plan Note (Signed)
 04/30/2017: Left lumpectomy: IDC grade 3, 1.7 cm, DCIS, lymphovascular invasion present, margins negative, 0/1 lymph node negative, ER 0%, PR 0%, HER-2 negative ratio 1.37, Ki-67 40%, T1c N0 stage IB    Treatment summary: 1. adjuvant chemotherapy with dose dense Adriamycin  and Cytoxan  4 followed by Taxol  weekly 6 discontinued for neuropathy 2. Followed by radiation started 09/26/2017-10/25/2018  Breast cancer recurrence: mammogram 12/10/2022: Asymmetry/distortion left breast, ultrasound: 2 adjacent irregular masses 2 o'clock position left breast 1.9 cm and 1.2 cm, no suspicious lymph nodes Left breast biopsy 2:00 posterior: Grade 2 IDC with DCIS Left breast biopsy 2:00 anterior: Grade 2 IDC with DCIS ER 0%, PR 0%, HER2 0, Ki-67 30% 01/31/2023: Bone scan: Negative, CT CAP 01/26/2023: Negative for metastatic disease but nodular contour of the liver suggestive of cirrhosis 01/26/2023: Ultrasound left submandibular neck: Normal lymph node 1.2 cm 01/24/2023: MRI breast: The recently biopsied area measured 6.1 cm, skin thickening (post radiation)   Recommendation: 03/12/2023: Bilateral mastectomies: Right breast: Benign left mastectomy: Grade 3 IDC 6 cm with high-grade DCIS, margins negative, 1 intramammary lymph node negative, 0/2 sentinel lymph nodes, ER 0%, PR 0%, HER2 0, Ki-67 30% Followed by adjuvant chemotherapy with CMF x 6 cycles started 05/09/2023 ------------------------------------------------------------------------------------------------------------------------------------------------ Current treatment: Cycle 6 CMF   Chemo toxicities: Fatigue: Extremely severe Chemotherapy-induced anemia: Mild To moderate nausea: Takes antiemetics which appear to be helping her but she does not feel good overall. Neutropenia: We reduced the dosage of her chemotherapy Lymphedema left hand: Patient is working with physical therapy.       This concludes her chemotherapy.  We will remove her  port. ------------------------------------- Assessment and Plan    Chemotherapy-Induced Diarrhea   Completed final chemotherapy session but continues to experience significant diarrhea, particularly on the second and third days post-treatment. Managed with Imodium, taking two tablets at onset and one additional tablet after each subsequent episode, totaling four to five tablets. Diarrhea subsides after initial days but leaves her feeling drained. Declined stronger anti-diarrheal medication.   - Continue current regimen of Imodium as needed   - Offer stronger anti-diarrheal medication if symptoms persist     Chemotherapy-Induced Fatigue   Persistent fatigue throughout chemotherapy treatment, likely multifactorial, including effects of chemotherapy and diarrhea.   - Encourage rest and gradual increase in physical activity as tolerated     Chemotherapy-Induced Peripheral Neuropathy   Experiences neuropathy in hands and feet, particularly affecting tips of fingers and outer parts of feet. Symptoms persisted throughout chemotherapy treatment.   - Monitor symptoms and consider referral to a neurologist if symptoms worsen     Hyperglycemia   Elevated blood sugar levels managed by primary care physician. Plan to remove steroid injection to help control blood sugar levels.   - Remove steroid injection   - Coordinate with primary care physician for further management   - Administer insulin  as needed during visits     Mild Anemia   Mild anemia showing improvement since last visit.   - Continue monitoring hemoglobin levels     Lymphedema   Ongoing lymphedema, fluctuates in severity. Currently managed with a therapist.   - Continue working with the therapist

## 2024-02-10 ENCOUNTER — Inpatient Hospital Stay: Attending: Hematology and Oncology | Admitting: Hematology and Oncology

## 2024-02-10 ENCOUNTER — Inpatient Hospital Stay

## 2024-02-10 VITALS — BP 140/81 | HR 76 | Temp 97.7°F | Resp 17 | Ht 64.0 in | Wt 229.0 lb

## 2024-02-10 DIAGNOSIS — E1169 Type 2 diabetes mellitus with other specified complication: Secondary | ICD-10-CM | POA: Diagnosis not present

## 2024-02-10 DIAGNOSIS — I890 Lymphedema, not elsewhere classified: Secondary | ICD-10-CM | POA: Diagnosis not present

## 2024-02-10 DIAGNOSIS — Z171 Estrogen receptor negative status [ER-]: Secondary | ICD-10-CM | POA: Diagnosis not present

## 2024-02-10 DIAGNOSIS — I10 Essential (primary) hypertension: Secondary | ICD-10-CM | POA: Diagnosis not present

## 2024-02-10 DIAGNOSIS — Z853 Personal history of malignant neoplasm of breast: Secondary | ICD-10-CM | POA: Diagnosis not present

## 2024-02-10 DIAGNOSIS — C50412 Malignant neoplasm of upper-outer quadrant of left female breast: Secondary | ICD-10-CM | POA: Insufficient documentation

## 2024-02-10 DIAGNOSIS — E1142 Type 2 diabetes mellitus with diabetic polyneuropathy: Secondary | ICD-10-CM | POA: Diagnosis not present

## 2024-02-10 DIAGNOSIS — E1140 Type 2 diabetes mellitus with diabetic neuropathy, unspecified: Secondary | ICD-10-CM | POA: Diagnosis not present

## 2024-02-10 DIAGNOSIS — E1165 Type 2 diabetes mellitus with hyperglycemia: Secondary | ICD-10-CM | POA: Diagnosis not present

## 2024-02-10 DIAGNOSIS — N6489 Other specified disorders of breast: Secondary | ICD-10-CM | POA: Diagnosis not present

## 2024-02-10 LAB — CBC WITH DIFFERENTIAL (CANCER CENTER ONLY)
Abs Immature Granulocytes: 0.01 10*3/uL (ref 0.00–0.07)
Basophils Absolute: 0 10*3/uL (ref 0.0–0.1)
Basophils Relative: 0 %
Eosinophils Absolute: 0.1 10*3/uL (ref 0.0–0.5)
Eosinophils Relative: 1 %
HCT: 36.4 % (ref 36.0–46.0)
Hemoglobin: 11.6 g/dL — ABNORMAL LOW (ref 12.0–15.0)
Immature Granulocytes: 0 %
Lymphocytes Relative: 33 %
Lymphs Abs: 1.6 10*3/uL (ref 0.7–4.0)
MCH: 28 pg (ref 26.0–34.0)
MCHC: 31.9 g/dL (ref 30.0–36.0)
MCV: 87.7 fL (ref 80.0–100.0)
Monocytes Absolute: 0.5 10*3/uL (ref 0.1–1.0)
Monocytes Relative: 11 %
Neutro Abs: 2.6 10*3/uL (ref 1.7–7.7)
Neutrophils Relative %: 55 %
Platelet Count: 121 10*3/uL — ABNORMAL LOW (ref 150–400)
RBC: 4.15 MIL/uL (ref 3.87–5.11)
RDW: 13.1 % (ref 11.5–15.5)
WBC Count: 4.9 10*3/uL (ref 4.0–10.5)
nRBC: 0 % (ref 0.0–0.2)

## 2024-02-10 LAB — CMP (CANCER CENTER ONLY)
ALT: 26 U/L (ref 0–44)
AST: 40 U/L (ref 15–41)
Albumin: 3.9 g/dL (ref 3.5–5.0)
Alkaline Phosphatase: 79 U/L (ref 38–126)
Anion gap: 5 (ref 5–15)
BUN: 20 mg/dL (ref 8–23)
CO2: 28 mmol/L (ref 22–32)
Calcium: 9.2 mg/dL (ref 8.9–10.3)
Chloride: 107 mmol/L (ref 98–111)
Creatinine: 0.91 mg/dL (ref 0.44–1.00)
GFR, Estimated: >60 mL/min (ref >60)
Glucose, Bld: 224 mg/dL — ABNORMAL HIGH (ref 70–99)
Potassium: 4.1 mmol/L (ref 3.5–5.1)
Sodium: 140 mmol/L (ref 135–145)
Total Bilirubin: 0.4 mg/dL (ref 0.0–1.2)
Total Protein: 7.3 g/dL (ref 6.5–8.1)

## 2024-02-10 NOTE — Progress Notes (Signed)
 Patient Care Team: Odean Potts, MD as PCP - General (Hematology and Oncology) Odean Potts, MD as Consulting Physician (Hematology and Oncology) Izell Domino, MD as Attending Physician (Radiation Oncology) Vanderbilt Ned, MD as Consulting Physician (General Surgery) Crawford Morna Pickle, NP as Nurse Practitioner (Hematology and Oncology)  DIAGNOSIS:  Encounter Diagnosis  Name Primary?   Malignant neoplasm of upper-outer quadrant of left breast in female, estrogen receptor negative (HCC) Yes    SUMMARY OF ONCOLOGIC HISTORY: Oncology History  Malignant neoplasm of upper-outer quadrant of left breast in female, estrogen receptor negative (HCC)  04/05/2017 Initial Diagnosis   Left breast asymmetry by ultrasound measured 1.3 cm at 2:30 position 10 cm from nipple, no axillary lymph nodes; biopsy IDC grade 2, ER 0%, PR 0%, HER-2 negative ratio 1.37, Ki-67 40%, T1c N0 stage IB AJCC 8    04/30/2017 Surgery   Left lumpectomy: IDC grade 3, 1.7 cm, DCIS, lymphovascular invasion present, margins negative, 0/1 lymph node negative, ER 0%, PR 0%, HER-2 negative ratio 1.37, Ki-67 40%, T1c N0 stage IB   05/22/2017 Genetic Testing   Patient had genetic testing due to a personal history of triple negative breast cancer.  The Common Hereditary Cancer Panel was ordered. The Hereditary Gene Panel offered by [facility] includes sequencing and/or deletion duplication testing of the following 46 genes: APC, ATM, AXIN2, BARD1, BMPR1A, BRCA1, BRCA2, BRIP1, CDH1, CDKN2A (p14ARF), CDKN2A (p16INK4a), CHEK2, CTNNA1, DICER1, EPCAM (Deletion/duplication testing only), GREM1 (promoter region deletion/duplication testing only), KIT, MEN1, MLH1, MSH2, MSH3, MSH6, MUTYH, NBN, NF1, NHTL1, PALB2, PDGFRA, PMS2, POLD1, POLE, PTEN, RAD50, RAD51C, RAD51D, SDHB, SDHC, SDHD, SMAD4, SMARCA4. STK11, TP53, TSC1, TSC2, and VHL.  The following genes were evaluated for sequence changes only: SDHA and HOXB13 c.251G>A variant only.     Results: No pathogenic mutations identified.  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  The date of this test report is 05/22/2017.    05/24/2017 - 08/30/2017 Chemotherapy   Dose dense Adriamycin  and Cytoxan  4 followed by Taxol  weekly 5 (stopped early for neuropathy)    09/26/2017 - 10/22/2017 Radiation Therapy   Adjuvant radiation therapy   12/10/2022 Relapse/Recurrence   Mammogram detected distortion, 2 irregular masses 1.9 cm and 1.2 cm: Biopsy grade 2 IDC with DCIS triple negative, scans negative, MRI breast: 6.1 cm   03/12/2023 Surgery   Bilateral mastectomies: Right breast: Benign  left mastectomy: Grade 3 IDC 6 cm with high-grade DCIS, margins negative, 1 intramammary lymph node negative, 0/2 sentinel lymph nodes, ER 0%, PR 0%, HER2 0, Ki-67 30%   05/09/2023 - 09/11/2023 Chemotherapy   Patient is on Treatment Plan : BREAST Adjuvant CMF IV q21d     Recurrent breast cancer, left (HCC)  03/12/2023 Initial Diagnosis   Recurrent breast cancer, left (HCC)   03/12/2023 Cancer Staging   Staging form: Breast, AJCC 8th Edition - Pathologic stage from 03/12/2023: Stage IIIA (rpT3, pN0, cM0, G3, ER-, PR-, HER2-) - Signed by Crawford Morna Pickle, NP on 11/11/2023 Stage prefix: Recurrence Histologic grading system: 3 grade system     CHIEF COMPLIANT: Surveillance of breast cancer  HISTORY OF PRESENT ILLNESS: History of Present Illness Audrey Peters is a 68 year old female with diabetes and lymphedema who presents for follow-up of her conditions.  She experiences lymphedema in her arm, requiring fluid drainage every three weeks. She wears a compression sleeve but did not wear it today. This has persisted for nearly a year.  A seroma on her breast is under observation. Recent  blood tests show a normal white blood cell count, hemoglobin at 11 g/dL, and platelets above 899,999. Liver function tests have improved, and kidney function and electrolytes are normal.      ALLERGIES:  is allergic to canagliflozin, empagliflozin, other, sulfa antibiotics, and ciprofloxacin.  MEDICATIONS:  Current Outpatient Medications  Medication Sig Dispense Refill   acetaminophen  (TYLENOL ) 650 MG CR tablet Take 650 mg by mouth every 8 (eight) hours as needed for pain.     atorvastatin  (LIPITOR) 10 MG tablet 1 tablet     clotrimazole -betamethasone (LOTRISONE) cream  (Patient taking differently: Prn)     Continuous Blood Gluc Sensor (FREESTYLE LIBRE 2 SENSOR) MISC .     DULoxetine  (CYMBALTA ) 30 MG capsule Take 30 mg by mouth daily.     gabapentin  (NEURONTIN ) 100 MG capsule Take 100 mg by mouth 3 (three) times daily.     glucose blood (ONETOUCH VERIO) test strip use to check blood sugar     glucose blood test strip by miscellaneous route.     Insulin  Pen Needle (B-D ULTRAFINE III SHORT PEN) 31G X 8 MM MISC 3 (three) times daily.     insulin  regular human CONCENTRATED (HUMULIN R  U-500 KWIKPEN) 500 UNIT/ML kwikpen 150 units before breakfast, 80 units before lunch, 80 units before evening meal     losartan  (COZAAR ) 100 MG tablet Take 100 mg by mouth daily.     methocarbamol  (ROBAXIN ) 500 MG tablet Take 1 tablet (500 mg total) by mouth every 6 (six) hours as needed for muscle spasms. 20 tablet 3   ONETOUCH VERIO test strip 2 (two) times daily.     oxyCODONE  (OXY IR/ROXICODONE ) 5 MG immediate release tablet Take 1 tablet (5 mg total) by mouth every 4 (four) hours as needed for moderate pain. 30 tablet 0   pregabalin  (LYRICA ) 150 MG capsule 1 capsule     Semaglutide,0.25 or 0.5MG /DOS, 2 MG/1.5ML SOPN Inject 0.25 mg into the skin once a week.     senna (SENOKOT) 8.6 MG TABS tablet Take 1 tablet (8.6 mg total) by mouth 2 (two) times daily. 60 tablet 0   triamterene -hydrochlorothiazide  (MAXZIDE -25) 37.5-25 MG tablet Take 1 tablet by mouth every morning.     No current facility-administered medications for this visit.    PHYSICAL EXAMINATION: ECOG PERFORMANCE STATUS: 1 -  Symptomatic but completely ambulatory  Vitals:   02/10/24 1054  BP: (!) 140/81  Pulse: 76  Resp: 17  Temp: 97.7 F (36.5 C)  SpO2: 100%   Filed Weights   02/10/24 1054  Weight: 229 lb (103.9 kg)      LABORATORY DATA:  I have reviewed the data as listed    Latest Ref Rng & Units 02/10/2024   10:27 AM 12/09/2023   12:04 PM 11/11/2023   10:32 AM  CMP  Glucose 70 - 99 mg/dL 775  846  781   BUN 8 - 23 mg/dL 20  18  23    Creatinine 0.44 - 1.00 mg/dL 9.08  9.15  9.09   Sodium 135 - 145 mmol/L 140  138  138   Potassium 3.5 - 5.1 mmol/L 4.1  3.9  4.1   Chloride 98 - 111 mmol/L 107  105  104   CO2 22 - 32 mmol/L 28  28  26    Calcium  8.9 - 10.3 mg/dL 9.2  9.1  9.3   Total Protein 6.5 - 8.1 g/dL 7.3  7.1  7.3   Total Bilirubin 0.0 - 1.2 mg/dL 0.4  0.6  0.5   Alkaline Phos 38 - 126 U/L 79  65  62   AST 15 - 41 U/L 40  48  54   ALT 0 - 44 U/L 26  32  33     Lab Results  Component Value Date   WBC 4.9 02/10/2024   HGB 11.6 (L) 02/10/2024   HCT 36.4 02/10/2024   MCV 87.7 02/10/2024   PLT 121 (L) 02/10/2024   NEUTROABS 2.6 02/10/2024    ASSESSMENT & PLAN:  Malignant neoplasm of upper-outer quadrant of left breast in female, estrogen receptor negative (HCC) 04/30/2017: Left lumpectomy: IDC grade 3, 1.7 cm, DCIS, lymphovascular invasion present, margins negative, 0/1 lymph node negative, ER 0%, PR 0%, HER-2 negative ratio 1.37, Ki-67 40%, T1c N0 stage IB    Treatment summary: 1. adjuvant chemotherapy with dose dense Adriamycin  and Cytoxan  4 followed by Taxol  weekly 6 discontinued for neuropathy 2. Followed by radiation started 09/26/2017-10/25/2018  Breast cancer recurrence: mammogram 12/10/2022: Asymmetry/distortion left breast, ultrasound: 2 adjacent irregular masses 2 o'clock position left breast 1.9 cm and 1.2 cm, no suspicious lymph nodes Left breast biopsy 2:00 posterior: Grade 2 IDC with DCIS Left breast biopsy 2:00 anterior: Grade 2 IDC with DCIS ER 0%, PR 0%, HER2 0,  Ki-67 30% 01/31/2023: Bone scan: Negative, CT CAP 01/26/2023: Negative for metastatic disease but nodular contour of the liver suggestive of cirrhosis 01/26/2023: Ultrasound left submandibular neck: Normal lymph node 1.2 cm 01/24/2023: MRI breast: The recently biopsied area measured 6.1 cm, skin thickening (post radiation)   Recommendation: 03/12/2023: Bilateral mastectomies: Right breast: Benign left mastectomy: Grade 3 IDC 6 cm with high-grade DCIS, margins negative, 1 intramammary lymph node negative, 0/2 sentinel lymph nodes, ER 0%, PR 0%, HER2 0, Ki-67 30% Followed by adjuvant chemotherapy with CMF x 6 cycles started 05/09/2023-09/11/2023 ------------------------------------------------------------------------------------------------------------------------------------------------ Current treatment: Surveillance   Chemotherapy-Induced Peripheral Neuropathy   Experiences neuropathy in hands and feet, particularly affecting tips of fingers and outer parts of feet. Symptoms persisted throughout chemotherapy treatment.   - Monitor symptoms and consider referral to a neurologist if symptoms worsen     Hyperglycemia   Elevated blood sugar levels managed by primary care physician. Plan to remove steroid injection to help control blood sugar levels.   - Remove steroid injection   - Coordinate with primary care physician for further management   - Administer insulin  as needed during visits      Lymphedema   Ongoing lymphedema, fluctuates in severity. Currently managed with a therapist.   - Continue working with the therapist      No orders of the defined types were placed in this encounter.  The patient has a good understanding of the overall plan. she agrees with it. she will call with any problems that may develop before the next visit here. Total time spent: 30 mins including face to face time and time spent for planning, charting and co-ordination of care   Viinay K Devarius Nelles,  MD 02/10/24

## 2024-02-12 DIAGNOSIS — E1169 Type 2 diabetes mellitus with other specified complication: Secondary | ICD-10-CM | POA: Diagnosis not present

## 2024-02-12 DIAGNOSIS — I10 Essential (primary) hypertension: Secondary | ICD-10-CM | POA: Diagnosis not present

## 2024-02-12 DIAGNOSIS — E1142 Type 2 diabetes mellitus with diabetic polyneuropathy: Secondary | ICD-10-CM | POA: Diagnosis not present

## 2024-02-12 DIAGNOSIS — E1140 Type 2 diabetes mellitus with diabetic neuropathy, unspecified: Secondary | ICD-10-CM | POA: Diagnosis not present

## 2024-02-27 DIAGNOSIS — Z124 Encounter for screening for malignant neoplasm of cervix: Secondary | ICD-10-CM | POA: Diagnosis not present

## 2024-02-27 DIAGNOSIS — Z01419 Encounter for gynecological examination (general) (routine) without abnormal findings: Secondary | ICD-10-CM | POA: Diagnosis not present

## 2024-03-02 DIAGNOSIS — N6489 Other specified disorders of breast: Secondary | ICD-10-CM | POA: Diagnosis not present

## 2024-03-02 DIAGNOSIS — Z171 Estrogen receptor negative status [ER-]: Secondary | ICD-10-CM | POA: Diagnosis not present

## 2024-03-02 DIAGNOSIS — C50412 Malignant neoplasm of upper-outer quadrant of left female breast: Secondary | ICD-10-CM | POA: Diagnosis not present

## 2024-03-10 DIAGNOSIS — E119 Type 2 diabetes mellitus without complications: Secondary | ICD-10-CM | POA: Diagnosis not present

## 2024-03-10 DIAGNOSIS — H524 Presbyopia: Secondary | ICD-10-CM | POA: Diagnosis not present

## 2024-03-12 DIAGNOSIS — E1165 Type 2 diabetes mellitus with hyperglycemia: Secondary | ICD-10-CM | POA: Diagnosis not present

## 2024-03-12 DIAGNOSIS — I10 Essential (primary) hypertension: Secondary | ICD-10-CM | POA: Diagnosis not present

## 2024-03-12 DIAGNOSIS — E1169 Type 2 diabetes mellitus with other specified complication: Secondary | ICD-10-CM | POA: Diagnosis not present

## 2024-03-12 DIAGNOSIS — E1140 Type 2 diabetes mellitus with diabetic neuropathy, unspecified: Secondary | ICD-10-CM | POA: Diagnosis not present

## 2024-03-12 DIAGNOSIS — E1142 Type 2 diabetes mellitus with diabetic polyneuropathy: Secondary | ICD-10-CM | POA: Diagnosis not present

## 2024-03-12 DIAGNOSIS — C50412 Malignant neoplasm of upper-outer quadrant of left female breast: Secondary | ICD-10-CM | POA: Diagnosis not present

## 2024-03-13 DIAGNOSIS — E1140 Type 2 diabetes mellitus with diabetic neuropathy, unspecified: Secondary | ICD-10-CM | POA: Diagnosis not present

## 2024-03-13 DIAGNOSIS — E1142 Type 2 diabetes mellitus with diabetic polyneuropathy: Secondary | ICD-10-CM | POA: Diagnosis not present

## 2024-03-13 DIAGNOSIS — E1169 Type 2 diabetes mellitus with other specified complication: Secondary | ICD-10-CM | POA: Diagnosis not present

## 2024-03-13 DIAGNOSIS — I10 Essential (primary) hypertension: Secondary | ICD-10-CM | POA: Diagnosis not present

## 2024-03-20 DIAGNOSIS — Z171 Estrogen receptor negative status [ER-]: Secondary | ICD-10-CM | POA: Diagnosis not present

## 2024-03-20 DIAGNOSIS — N6489 Other specified disorders of breast: Secondary | ICD-10-CM | POA: Diagnosis not present

## 2024-03-20 DIAGNOSIS — C50412 Malignant neoplasm of upper-outer quadrant of left female breast: Secondary | ICD-10-CM | POA: Diagnosis not present

## 2024-04-08 DIAGNOSIS — I10 Essential (primary) hypertension: Secondary | ICD-10-CM | POA: Diagnosis not present

## 2024-04-08 DIAGNOSIS — G620 Drug-induced polyneuropathy: Secondary | ICD-10-CM | POA: Diagnosis not present

## 2024-04-08 DIAGNOSIS — E1169 Type 2 diabetes mellitus with other specified complication: Secondary | ICD-10-CM | POA: Diagnosis not present

## 2024-04-08 DIAGNOSIS — R32 Unspecified urinary incontinence: Secondary | ICD-10-CM | POA: Diagnosis not present

## 2024-04-08 DIAGNOSIS — E1142 Type 2 diabetes mellitus with diabetic polyneuropathy: Secondary | ICD-10-CM | POA: Diagnosis not present

## 2024-04-08 DIAGNOSIS — C50412 Malignant neoplasm of upper-outer quadrant of left female breast: Secondary | ICD-10-CM | POA: Diagnosis not present

## 2024-04-08 DIAGNOSIS — E7800 Pure hypercholesterolemia, unspecified: Secondary | ICD-10-CM | POA: Diagnosis not present

## 2024-04-09 DIAGNOSIS — N6489 Other specified disorders of breast: Secondary | ICD-10-CM | POA: Diagnosis not present

## 2024-04-09 DIAGNOSIS — Z171 Estrogen receptor negative status [ER-]: Secondary | ICD-10-CM | POA: Diagnosis not present

## 2024-04-09 DIAGNOSIS — C50412 Malignant neoplasm of upper-outer quadrant of left female breast: Secondary | ICD-10-CM | POA: Diagnosis not present

## 2024-04-12 DIAGNOSIS — I10 Essential (primary) hypertension: Secondary | ICD-10-CM | POA: Diagnosis not present

## 2024-04-12 DIAGNOSIS — E1140 Type 2 diabetes mellitus with diabetic neuropathy, unspecified: Secondary | ICD-10-CM | POA: Diagnosis not present

## 2024-04-12 DIAGNOSIS — E1165 Type 2 diabetes mellitus with hyperglycemia: Secondary | ICD-10-CM | POA: Diagnosis not present

## 2024-04-12 DIAGNOSIS — E1142 Type 2 diabetes mellitus with diabetic polyneuropathy: Secondary | ICD-10-CM | POA: Diagnosis not present

## 2024-04-12 DIAGNOSIS — E1169 Type 2 diabetes mellitus with other specified complication: Secondary | ICD-10-CM | POA: Diagnosis not present

## 2024-04-12 DIAGNOSIS — C50412 Malignant neoplasm of upper-outer quadrant of left female breast: Secondary | ICD-10-CM | POA: Diagnosis not present

## 2024-04-14 DIAGNOSIS — E1165 Type 2 diabetes mellitus with hyperglycemia: Secondary | ICD-10-CM | POA: Diagnosis not present

## 2024-04-14 DIAGNOSIS — Z794 Long term (current) use of insulin: Secondary | ICD-10-CM | POA: Diagnosis not present

## 2024-04-14 DIAGNOSIS — E1140 Type 2 diabetes mellitus with diabetic neuropathy, unspecified: Secondary | ICD-10-CM | POA: Diagnosis not present

## 2024-04-21 ENCOUNTER — Telehealth: Payer: Self-pay | Admitting: Pharmacist

## 2024-04-21 NOTE — Progress Notes (Signed)
 04/21/2024 Name: Audrey Peters MRN: [id number] DOB: 1955-10-12  Chief Complaint  Patient presents with   Diabetes    Audrey Peters is a 68 y.o. year old female who presented for a telephone visit.   They were referred to the pharmacist by a quality report for assistance in managing diabetes.   TNM DM patient  Subjective:  Care Team: Primary Care Provider: Rexanne Ingle, MD ; Next Scheduled Visit: 11/10/24 Endocrinologist: Dr. Faythe; Next Scheduled visit: 10/3 Clinical Pharmacist: Aloysius Lewis, PharmD  Medication Access/Adherence  Current Pharmacy:  [facility] [place], [place] - [address] [facility] [address] [place] [address] Phone: [phone] Fax: [phone]   Patient reports affordability concerns with their medications: No  Patient reports access/transportation concerns to their pharmacy: No  Patient reports adherence concerns with their medications:  Yes - reports having a lot going on with breast cancer; has missed a few doses with her medications until cleared by the doctors to continue   Diabetes:  Current medications: Farxiga 5mg  daily (started 9/2), Humulin U-500 w/ meals- 170U before breakfast, 90U lunch, 90U dinner, Ozempic 0.5mg  weekly on Wednesdays Medications tried in the past: Metformin  (GI issues), Invokana/Jardiance (yeast infx), Humalog/Novolog , Novolog  70/30, Trulicity 4.5mg  (switched to Ozempic)  [employer] 2 prior  Current glucose readings: 140 to 200's in AM  Patient denies hypoglycemic s/sx including dizziness, shakiness, sweating. Patient denies hyperglycemic symptoms including polyuria, polydipsia, polyphagia, nocturia, neuropathy, blurred vision.*Reports occasional lows but cannot determine what causes them  Current meal patterns: (has 2 meals a lot of the time) - Breakfast: Bacon, oatmeal, grits, boiled egg, yogurt - Lunch/Dinner: Chicken, shrimp, broccoli, okra (lots of chicken +  vegetable) - Snacks at 9PM: Fruit, peanuts, cookie (mini vanilla wafers), chips (area for improvement per patient) - Drinks: Sugarfree MinuteMaid, occasional diet soda, mostly water  - Has a 10oz coffee w/ Spenda + hazelnut creamer each morning  Current medication access support: State Plan + [employer]  Macrovascular and Microvascular Risk Reduction:  Statin? yes (Atorvastatin  10mg  daily); ACEi/ARB? yes (Losartan  100mg  daily) Last urinary albumin/creatinine ratio:  8/27 <6 Last eye exam:  Unknown Last foot exam: No foot exam found; 9/2 by Dr. Faythe Tobacco Use:  Tobacco Use: Low Risk  (04/09/2024)   Received from [facility]   Patient History    Smoking Tobacco Use: Never    Smokeless Tobacco Use: Never    Passive Exposure: Not on file    A1c 8/27: 11.0% eGFR 63  Objective:  Lab Results  Component Value Date   HGBA1C 10.6 (H) 08/20/2023    Lab Results  Component Value Date   CREATININE 0.91 02/10/2024   BUN 20 02/10/2024   NA 140 02/10/2024   K 4.1 02/10/2024   CL 107 02/10/2024   CO2 28 02/10/2024    No results found for: CHOL, HDL, LDLCALC, LDLDIRECT, TRIG, CHOLHDL  Medications Reviewed Today     Reviewed by Lewis Aloysius HERO, [facility] (Pharmacist) on 04/21/24 at 1104  Med List Status: <None>   Medication Order Taking? Sig Documenting Provider Last Dose Status Informant  acetaminophen  (TYLENOL ) 650 MG CR tablet [id number] Yes Take 650 mg by mouth every 8 (eight) hours as needed for pain. [provider]  Active Self  atorvastatin  (LIPITOR) 10 MG tablet [id number] Yes 1 tablet [provider]  Active   clotrimazole -betamethasone (LOTRISONE) cream [id number] Yes Apply 1 Application topically 2 (two)  times daily. As directed [provider]  Active     Discontinued 04/21/24 1058 (Patient Preference)     Discontinued 04/21/24 1058 (Completed Course)   FARXIGA 5 MG TABS tablet [id number] Yes Take 5  mg by mouth daily. [provider]  Active     Discontinued 04/21/24 1058 (Completed Course) glucose blood (ONETOUCH VERIO) test strip [id number] Yes use to check blood sugar [provider]  Active     Discontinued 04/21/24 1059 (Completed Course)   Insulin  Pen Needle (B-D ULTRAFINE III SHORT PEN) 31G X 8 MM MISC [id number]  3 (three) times daily. [provider]  Active   insulin  regular human CONCENTRATED (HUMULIN R  U-500 KWIKPEN) 500 UNIT/ML kary [id number] Yes 150 units before breakfast, 80 units before lunch, 80 units before evening meal  Patient taking differently: 170U before breakfast, 90U lunch, 90U dinner   [provider]  Active   losartan  (COZAAR ) 100 MG tablet [id number] Yes Take 100 mg by mouth daily. [provider]  Active Self    Discontinued 04/21/24 1100 (Completed Course)   ONETOUCH VERIO test strip [id number] Yes 2 (two) times daily. [provider]  Active     Discontinued 04/21/24 1100 (Completed Course)   pregabalin  (LYRICA ) 150 MG capsule [id number] Yes Take 150 mg by mouth in the morning, at noon, and at bedtime. [provider]  Active   Semaglutide,0.25 or 0.5MG /DOS, 2 MG/1.5ML SOPN [id number] Yes Inject 0.5 mg into the skin once a week. [provider]  Active Self  senna (SENOKOT) 8.6 MG TABS tablet [id number] Yes Take 1 tablet (8.6 mg total) by mouth 2 (two) times daily. Aron Shoulders, MD  Active   triamterene -hydrochlorothiazide  (MAXZIDE -25) 37.5-25 MG tablet [id number]  Take 1 tablet by mouth every morning. [provider]  Active               Assessment/Plan:   Diabetes: - Currently uncontrolled; goal A1c <7%. Cardiorenal risk reduction is optimized.. Blood pressure is at goal <130/80. LDL is not at goal.  - Reviewed goal A1c, goal fasting, and goal 2 hour post prandial glucose. Recommended to check glucose daily and Reviewed dietary modifications including focusing on proteins  and vegetables; should work on switching late night snacks to healthier alternatives or switch it all together.  3 months missed on Atorvastatin  adherence- will need to work on adherence which patient noted   Follow Up Plan:  - See how visit goes with Dr. Faythe on 10/2 and decide upon follow-up - Discussion about diet today; will work on trying healthy alternatives for 9PM snack or removing altogether - Will consider trying to the Dexcom with stomach placement again to assist with adjusting insulin  doses further- noted added to Dr. Faythe visit to discuss further - No concerns with the Farxiga at this time- continuing to take daily - Will call [facility] to get Ozempic refilled- having issues with getting her medication  Update on 9/10: - Called [facility] for Ozempic refill- reports script expired in June 2025 - Refill request sent to Dr. Faythe Aloysius Lewis, PharmD [place]  Phone Number: [phone]

## 2024-05-05 DIAGNOSIS — L7634 Postprocedural seroma of skin and subcutaneous tissue following other procedure: Secondary | ICD-10-CM | POA: Diagnosis not present

## 2024-05-05 DIAGNOSIS — Z171 Estrogen receptor negative status [ER-]: Secondary | ICD-10-CM | POA: Diagnosis not present

## 2024-05-05 DIAGNOSIS — C50412 Malignant neoplasm of upper-outer quadrant of left female breast: Secondary | ICD-10-CM | POA: Diagnosis not present

## 2024-05-12 DIAGNOSIS — E1169 Type 2 diabetes mellitus with other specified complication: Secondary | ICD-10-CM | POA: Diagnosis not present

## 2024-05-12 DIAGNOSIS — I10 Essential (primary) hypertension: Secondary | ICD-10-CM | POA: Diagnosis not present

## 2024-05-12 DIAGNOSIS — E1140 Type 2 diabetes mellitus with diabetic neuropathy, unspecified: Secondary | ICD-10-CM | POA: Diagnosis not present

## 2024-05-12 DIAGNOSIS — E1142 Type 2 diabetes mellitus with diabetic polyneuropathy: Secondary | ICD-10-CM | POA: Diagnosis not present

## 2024-05-12 DIAGNOSIS — C50412 Malignant neoplasm of upper-outer quadrant of left female breast: Secondary | ICD-10-CM | POA: Diagnosis not present

## 2024-05-12 DIAGNOSIS — E1165 Type 2 diabetes mellitus with hyperglycemia: Secondary | ICD-10-CM | POA: Diagnosis not present

## 2024-05-13 DIAGNOSIS — E119 Type 2 diabetes mellitus without complications: Secondary | ICD-10-CM | POA: Diagnosis not present

## 2024-05-15 DIAGNOSIS — Z794 Long term (current) use of insulin: Secondary | ICD-10-CM | POA: Diagnosis not present

## 2024-05-15 DIAGNOSIS — E1140 Type 2 diabetes mellitus with diabetic neuropathy, unspecified: Secondary | ICD-10-CM | POA: Diagnosis not present

## 2024-05-20 DIAGNOSIS — E1142 Type 2 diabetes mellitus with diabetic polyneuropathy: Secondary | ICD-10-CM | POA: Diagnosis not present

## 2024-05-20 DIAGNOSIS — E1169 Type 2 diabetes mellitus with other specified complication: Secondary | ICD-10-CM | POA: Diagnosis not present

## 2024-05-20 DIAGNOSIS — E1140 Type 2 diabetes mellitus with diabetic neuropathy, unspecified: Secondary | ICD-10-CM | POA: Diagnosis not present

## 2024-05-20 DIAGNOSIS — I10 Essential (primary) hypertension: Secondary | ICD-10-CM | POA: Diagnosis not present

## 2024-05-26 DIAGNOSIS — C50412 Malignant neoplasm of upper-outer quadrant of left female breast: Secondary | ICD-10-CM | POA: Diagnosis not present

## 2024-05-26 DIAGNOSIS — N6489 Other specified disorders of breast: Secondary | ICD-10-CM | POA: Diagnosis not present

## 2024-05-26 DIAGNOSIS — Z171 Estrogen receptor negative status [ER-]: Secondary | ICD-10-CM | POA: Diagnosis not present

## 2024-06-08 ENCOUNTER — Telehealth: Payer: Self-pay | Admitting: Pharmacist

## 2024-06-08 NOTE — Progress Notes (Signed)
   06/08/2024  Patient ID: Tobias JONELLE Mace, female   DOB: 01-Nov-1955, 68 y.o.   MRN: [id number]  Called and spoke with the patient on the phone for a check-in today. Reports she has been working on taking her insulin  as prescribed but has not been using the CGM to track it. Did not want to mess-up or waste the sensors, so she is bringing everything to 11/4 visit with Dr. Faythe for application.   As for test strips, said she got a meter from [facility] and was told the strips weren't covered at the pharmacy. Called pharmacy afterwards and said last notes state they left a voicemail with the patient stating the verio strips weren't covered and to contact PCP's office for new script.   Advised we have sent it as a fax 1x since then and a failed second attempt on 10/14. Provided a verbal for the script under Dr. Faythe with the pharmacy so that they can go ahead and fill these for her. Has been checking her BG's each morning. Will follow-up at the end of the week to see if the strips are covered or not.    Aloysius Lewis, PharmD, [facility] Phone Number: [phone]

## 2024-06-12 DIAGNOSIS — E1140 Type 2 diabetes mellitus with diabetic neuropathy, unspecified: Secondary | ICD-10-CM | POA: Diagnosis not present

## 2024-06-12 DIAGNOSIS — C50412 Malignant neoplasm of upper-outer quadrant of left female breast: Secondary | ICD-10-CM | POA: Diagnosis not present

## 2024-06-12 DIAGNOSIS — E1169 Type 2 diabetes mellitus with other specified complication: Secondary | ICD-10-CM | POA: Diagnosis not present

## 2024-06-12 DIAGNOSIS — I10 Essential (primary) hypertension: Secondary | ICD-10-CM | POA: Diagnosis not present

## 2024-06-12 DIAGNOSIS — E1142 Type 2 diabetes mellitus with diabetic polyneuropathy: Secondary | ICD-10-CM | POA: Diagnosis not present

## 2024-06-12 DIAGNOSIS — E1165 Type 2 diabetes mellitus with hyperglycemia: Secondary | ICD-10-CM | POA: Diagnosis not present

## 2024-06-13 DIAGNOSIS — E119 Type 2 diabetes mellitus without complications: Secondary | ICD-10-CM | POA: Diagnosis not present

## 2024-06-16 DIAGNOSIS — E1165 Type 2 diabetes mellitus with hyperglycemia: Secondary | ICD-10-CM | POA: Diagnosis not present

## 2024-06-16 DIAGNOSIS — Z794 Long term (current) use of insulin: Secondary | ICD-10-CM | POA: Diagnosis not present

## 2024-06-16 DIAGNOSIS — E1140 Type 2 diabetes mellitus with diabetic neuropathy, unspecified: Secondary | ICD-10-CM | POA: Diagnosis not present

## 2024-06-16 DIAGNOSIS — B3731 Acute candidiasis of vulva and vagina: Secondary | ICD-10-CM | POA: Diagnosis not present

## 2024-06-19 DIAGNOSIS — I10 Essential (primary) hypertension: Secondary | ICD-10-CM | POA: Diagnosis not present

## 2024-06-19 DIAGNOSIS — E1142 Type 2 diabetes mellitus with diabetic polyneuropathy: Secondary | ICD-10-CM | POA: Diagnosis not present

## 2024-06-19 DIAGNOSIS — E1140 Type 2 diabetes mellitus with diabetic neuropathy, unspecified: Secondary | ICD-10-CM | POA: Diagnosis not present

## 2024-06-19 DIAGNOSIS — E1169 Type 2 diabetes mellitus with other specified complication: Secondary | ICD-10-CM | POA: Diagnosis not present

## 2024-06-22 DIAGNOSIS — C50412 Malignant neoplasm of upper-outer quadrant of left female breast: Secondary | ICD-10-CM | POA: Diagnosis not present

## 2024-06-22 DIAGNOSIS — N6489 Other specified disorders of breast: Secondary | ICD-10-CM | POA: Diagnosis not present

## 2024-06-22 DIAGNOSIS — Z171 Estrogen receptor negative status [ER-]: Secondary | ICD-10-CM | POA: Diagnosis not present

## 2024-06-23 ENCOUNTER — Encounter: Payer: Self-pay | Admitting: Adult Health

## 2024-07-12 DIAGNOSIS — E1165 Type 2 diabetes mellitus with hyperglycemia: Secondary | ICD-10-CM | POA: Diagnosis not present

## 2024-07-12 DIAGNOSIS — E1169 Type 2 diabetes mellitus with other specified complication: Secondary | ICD-10-CM | POA: Diagnosis not present

## 2024-07-12 DIAGNOSIS — E1140 Type 2 diabetes mellitus with diabetic neuropathy, unspecified: Secondary | ICD-10-CM | POA: Diagnosis not present

## 2024-07-12 DIAGNOSIS — E1142 Type 2 diabetes mellitus with diabetic polyneuropathy: Secondary | ICD-10-CM | POA: Diagnosis not present

## 2024-07-12 DIAGNOSIS — C50412 Malignant neoplasm of upper-outer quadrant of left female breast: Secondary | ICD-10-CM | POA: Diagnosis not present

## 2024-07-12 DIAGNOSIS — I10 Essential (primary) hypertension: Secondary | ICD-10-CM | POA: Diagnosis not present

## 2024-07-16 DIAGNOSIS — Z794 Long term (current) use of insulin: Secondary | ICD-10-CM | POA: Diagnosis not present

## 2024-07-16 DIAGNOSIS — E1165 Type 2 diabetes mellitus with hyperglycemia: Secondary | ICD-10-CM | POA: Diagnosis not present

## 2024-07-16 DIAGNOSIS — E1140 Type 2 diabetes mellitus with diabetic neuropathy, unspecified: Secondary | ICD-10-CM | POA: Diagnosis not present

## 2024-07-20 DIAGNOSIS — C50412 Malignant neoplasm of upper-outer quadrant of left female breast: Secondary | ICD-10-CM | POA: Diagnosis not present

## 2024-07-20 DIAGNOSIS — Z171 Estrogen receptor negative status [ER-]: Secondary | ICD-10-CM | POA: Diagnosis not present

## 2024-07-20 DIAGNOSIS — N6489 Other specified disorders of breast: Secondary | ICD-10-CM | POA: Diagnosis not present

## 2025-02-09 ENCOUNTER — Other Ambulatory Visit

## 2025-02-09 ENCOUNTER — Ambulatory Visit: Admitting: Hematology and Oncology
